# Patient Record
Sex: Female | Born: 1965 | Hispanic: No | Marital: Married | Smoking: Never smoker
Health system: Southern US, Community
[De-identification: ages and names within clinical notes are randomized; demographics above are authoritative.]

## PROBLEM LIST (undated history)

## (undated) DIAGNOSIS — M1990 Unspecified osteoarthritis, unspecified site: Secondary | ICD-10-CM

## (undated) DIAGNOSIS — E1142 Type 2 diabetes mellitus with diabetic polyneuropathy: Secondary | ICD-10-CM

## (undated) DIAGNOSIS — E119 Type 2 diabetes mellitus without complications: Secondary | ICD-10-CM

---
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
## 1998-08-24 ENCOUNTER — Other Ambulatory Visit: Admission: RE | Admit: 1998-08-24 | Discharge: 1998-08-24 | Payer: Self-pay | Admitting: Obstetrics & Gynecology

## 1998-08-25 ENCOUNTER — Other Ambulatory Visit: Admission: RE | Admit: 1998-08-25 | Discharge: 1998-08-25 | Payer: Self-pay | Admitting: Obstetrics & Gynecology

## 2015-01-08 DIAGNOSIS — M1460 Charcot's joint, unspecified site: Secondary | ICD-10-CM | POA: Insufficient documentation

## 2015-01-08 DIAGNOSIS — S92002A Unspecified fracture of left calcaneus, initial encounter for closed fracture: Secondary | ICD-10-CM | POA: Insufficient documentation

## 2015-03-27 DIAGNOSIS — I10 Essential (primary) hypertension: Secondary | ICD-10-CM | POA: Insufficient documentation

## 2015-03-27 DIAGNOSIS — L02619 Cutaneous abscess of unspecified foot: Secondary | ICD-10-CM | POA: Insufficient documentation

## 2015-03-27 DIAGNOSIS — M869 Osteomyelitis, unspecified: Secondary | ICD-10-CM | POA: Insufficient documentation

## 2015-03-27 DIAGNOSIS — M86171 Other acute osteomyelitis, right ankle and foot: Secondary | ICD-10-CM | POA: Insufficient documentation

## 2015-03-27 DIAGNOSIS — L03119 Cellulitis of unspecified part of limb: Secondary | ICD-10-CM

## 2015-03-27 DIAGNOSIS — E1065 Type 1 diabetes mellitus with hyperglycemia: Secondary | ICD-10-CM | POA: Insufficient documentation

## 2015-04-08 DIAGNOSIS — D649 Anemia, unspecified: Secondary | ICD-10-CM | POA: Insufficient documentation

## 2015-04-08 DIAGNOSIS — E8809 Other disorders of plasma-protein metabolism, not elsewhere classified: Secondary | ICD-10-CM | POA: Insufficient documentation

## 2015-04-08 DIAGNOSIS — E039 Hypothyroidism, unspecified: Secondary | ICD-10-CM | POA: Insufficient documentation

## 2015-04-08 DIAGNOSIS — Z89429 Acquired absence of other toe(s), unspecified side: Secondary | ICD-10-CM | POA: Insufficient documentation

## 2015-05-31 DIAGNOSIS — E10649 Type 1 diabetes mellitus with hypoglycemia without coma: Secondary | ICD-10-CM | POA: Insufficient documentation

## 2015-06-10 DIAGNOSIS — Z3041 Encounter for surveillance of contraceptive pills: Secondary | ICD-10-CM | POA: Insufficient documentation

## 2015-06-17 DIAGNOSIS — M5136 Other intervertebral disc degeneration, lumbar region: Secondary | ICD-10-CM | POA: Insufficient documentation

## 2015-06-17 DIAGNOSIS — N183 Chronic kidney disease, stage 3 unspecified: Secondary | ICD-10-CM | POA: Insufficient documentation

## 2015-06-17 DIAGNOSIS — E039 Hypothyroidism, unspecified: Secondary | ICD-10-CM | POA: Insufficient documentation

## 2015-06-17 DIAGNOSIS — M5126 Other intervertebral disc displacement, lumbar region: Secondary | ICD-10-CM | POA: Insufficient documentation

## 2015-06-17 DIAGNOSIS — E1022 Type 1 diabetes mellitus with diabetic chronic kidney disease: Secondary | ICD-10-CM | POA: Insufficient documentation

## 2015-06-17 DIAGNOSIS — R87810 Cervical high risk human papillomavirus (HPV) DNA test positive: Secondary | ICD-10-CM | POA: Insufficient documentation

## 2015-06-17 DIAGNOSIS — E1140 Type 2 diabetes mellitus with diabetic neuropathy, unspecified: Secondary | ICD-10-CM | POA: Insufficient documentation

## 2015-06-17 DIAGNOSIS — E785 Hyperlipidemia, unspecified: Secondary | ICD-10-CM | POA: Insufficient documentation

## 2015-06-18 DIAGNOSIS — R140 Abdominal distension (gaseous): Secondary | ICD-10-CM | POA: Insufficient documentation

## 2015-06-18 DIAGNOSIS — R635 Abnormal weight gain: Secondary | ICD-10-CM | POA: Insufficient documentation

## 2015-08-12 DIAGNOSIS — E1029 Type 1 diabetes mellitus with other diabetic kidney complication: Secondary | ICD-10-CM | POA: Insufficient documentation

## 2015-08-12 DIAGNOSIS — R809 Proteinuria, unspecified: Secondary | ICD-10-CM

## 2015-12-24 DIAGNOSIS — E119 Type 2 diabetes mellitus without complications: Secondary | ICD-10-CM | POA: Insufficient documentation

## 2015-12-24 DIAGNOSIS — R600 Localized edema: Secondary | ICD-10-CM | POA: Insufficient documentation

## 2016-02-09 DIAGNOSIS — S42001A Fracture of unspecified part of right clavicle, initial encounter for closed fracture: Secondary | ICD-10-CM | POA: Insufficient documentation

## 2016-03-15 DIAGNOSIS — E10621 Type 1 diabetes mellitus with foot ulcer: Secondary | ICD-10-CM | POA: Insufficient documentation

## 2016-03-15 DIAGNOSIS — E109 Type 1 diabetes mellitus without complications: Secondary | ICD-10-CM | POA: Insufficient documentation

## 2016-03-15 DIAGNOSIS — L97429 Non-pressure chronic ulcer of left heel and midfoot with unspecified severity: Secondary | ICD-10-CM

## 2016-05-03 ENCOUNTER — Ambulatory Visit: Payer: Self-pay | Admitting: Podiatry

## 2016-05-04 ENCOUNTER — Telehealth: Payer: Self-pay | Admitting: *Deleted

## 2016-05-04 DIAGNOSIS — Z01812 Encounter for preprocedural laboratory examination: Secondary | ICD-10-CM

## 2016-05-04 NOTE — Telephone Encounter (Signed)
Joni - [facility] asked to have clinicals from today's visit. Faxed note informing Joni, pt had rescheduled to 05/17/2016.

## 2016-05-17 ENCOUNTER — Ambulatory Visit: Payer: Self-pay | Admitting: Podiatry

## 2016-05-24 ENCOUNTER — Ambulatory Visit: Payer: Self-pay | Admitting: Podiatry

## 2016-05-26 NOTE — Telephone Encounter (Addendum)
----- Message from [name], DPM sent at 05/24/2016  3:03 PM EDT ----- If you can call them that would be great. She was a no show again today. This is the 3rd time.  ----- Message ----- From: Marissa Nestle, RN Sent: 05/24/2016   1:46 PM To: [name], DPM  Dr. Ardelle Anton, [facility] [phone]. Dr. Ardelle Anton, I can call if you would like, or do you want to give them more information. Joya San ----- Message ----- From: [name], DPM Sent: 05/24/2016  10:56 AM To: Marissa Nestle, RN  Do you have [facility]. They want a clinical note from her visit but she has either cancelled or been a no show now 3 times and I want to let them know. 05/26/2016-I informed Joni - [facility] of pt's cancellation and no show status.07/04/2016-Unable to process MRI orders or [facility] order pt does not have insurance information in EPIC.07/05/2016-Unable to process orders due to pt does not have insurance information in EPIC, [place]. [place]. 07/06/2016-Pt states she thought Dr. Ardelle Anton was to call in a cream as well as oral antibiotic. I spoke to pt and informed that Dr. Ardelle Anton was ordering a topical from a facility that would mail to her and they needed her contact number and insurance to discuss coverage and delivery. Pt states she will email the insurance information to me. 07/07/2016-Received email with pt's BCBS card front. Orders for MRI of left heel faxed to [facility]. Faxed orders for Collagen dressing with silver to [facility].07/08/2016-Pt called states the cream is still not at the [facility]. I called pt and reminded her the cream would come from a facility that would mail it to her, that is why I was so concerned with getting the insurance card copy. 07/11/2016-Pt states the MRI was cancelled due to the labs would not cross to the [place] lab and also needed a BUN. I spoke with Destiny  - [facility] and she gave me the number to speak with [facility] concerning labs. I spoke with Myriam Jacobson and she gave me the lab number for Renae Fickle and stated once she received the okay for Renae Fickle she would contact pt to reschedule. I spoke with Asher Muir in the lab and she said could send on rx form with pt information. I spoke with Renae Fickle at the lab and he states should have bar code, our [place] code and req # on the form and it would cross over. I asked if BUN/Creatinine Ratio was what he was looking for and he stated yes. I told him I would send on a script form and the printed lab form to make sure they were able to perform. Faxed the handwritten script and the printed labs to [facility].07/11/2016-Pt states she received a package from a company that states they can not fill due to insurance, what to do. 07/14/2016-I spoke with Judie Petit. Bethann Punches - [facility] and she states pt's deductible is $1400.00 and she was sent a 3-5 day complimentary dressing package with the option to cash pay, and their pricing is comparable to [employer]. I left message informing pt I had to research, then told her what M. Venard had stated and that I would inform Dr. Ardelle Anton and call with other instructions in case she did not want the cash pay offer from [facility].09/19/2016-Pt states she needs refill of the medicated dressing. Dr. Ardelle Anton  ordered continue the collagen AG dressing daily, and check pt's status. 09/20/2016-Left message informing pt I had received the request for the refill of the medicated dressing and to call with her health status. Left message for Greig Castillandrew - [facility] to call for refill order. Left message requesting call back with status of her foot and reminded her of the 09/27/2016 appt with [facility].09/21/2016-Pt states the foot is about the same little less drainage, size is the same. Pt states the last order for the silver dressing came  from a different agency and is $800.00, would like to have the order from the other supplier it cost less. I spoke with pt and she was unable to tell which supplier she preferred. Orders for collagen with silver faxed to [facility] for left heel wound 2.4 x 1.5 x 1.1cm L97.422 for daily dressing changes, faxed to [facility].

## 2016-06-07 ENCOUNTER — Ambulatory Visit: Payer: Self-pay | Admitting: Podiatry

## 2016-07-04 ENCOUNTER — Ambulatory Visit (INDEPENDENT_AMBULATORY_CARE_PROVIDER_SITE_OTHER): Payer: BLUE CROSS/BLUE SHIELD | Admitting: Podiatry

## 2016-07-04 DIAGNOSIS — L97422 Non-pressure chronic ulcer of left heel and midfoot with fat layer exposed: Secondary | ICD-10-CM

## 2016-07-04 DIAGNOSIS — L03032 Cellulitis of left toe: Secondary | ICD-10-CM

## 2016-07-04 DIAGNOSIS — L02612 Cutaneous abscess of left foot: Secondary | ICD-10-CM

## 2016-07-04 LAB — CBC WITH DIFFERENTIAL/PLATELET
Basophils Absolute: 0 cells/uL (ref 0–200)
Basophils Relative: 0 %
EOS ABS: 267 {cells}/uL (ref 15–500)
EOS PCT: 3 %
HEMATOCRIT: 39.4 % (ref 35.0–45.0)
HEMOGLOBIN: 12.6 g/dL (ref 11.7–15.5)
Lymphocytes Relative: 17 %
Lymphs Abs: 1513 cells/uL (ref 850–3900)
MCH: 29.6 pg (ref 27.0–33.0)
MCHC: 32 g/dL (ref 32.0–36.0)
MCV: 92.5 fL (ref 80.0–100.0)
MONO ABS: 890 {cells}/uL (ref 200–950)
MPV: 9.9 fL (ref 7.5–12.5)
Monocytes Relative: 10 %
NEUTROS ABS: 6230 {cells}/uL (ref 1500–7800)
NEUTROS PCT: 70 %
Platelets: 402 10*3/uL — ABNORMAL HIGH (ref 140–400)
RBC: 4.26 MIL/uL (ref 3.80–5.10)
RDW: 12.4 % (ref 11.0–15.0)
WBC: 8.9 10*3/uL (ref 3.8–10.8)

## 2016-07-04 MED ORDER — DOXYCYCLINE HYCLATE 100 MG PO TABS: 100.0000 mg | ORAL_TABLET | Freq: Two times a day (BID) | ORAL | 0 refills | Status: DC

## 2016-07-04 NOTE — Addendum Note (Signed)
Addended by: Ovid CurdWAGONER, MATTHEW R on: 07/04/2016 04:54 PM   Modules accepted: Orders

## 2016-07-04 NOTE — Progress Notes (Signed)
Subjective:    Patient ID: [name] [name], female   DOB: 51 y.o.   MRN: [id number]   HPI  [name] [name] presents to the office today for concerns of a wound on her left heel and left big toe which have been ongoing for several months. She has been a no show for her last 4 appointments with me and she presents today for an initial consultation. She states that she's had x-rays done to her left which is not of the results. She has an appointment with her infectious disease doctor this week. She has been seen by Infectious Disease for the nonhealing left foot ulcer. She was previously treated with doxycycline. She had some improvement while on antibiotics. She presents today stating she is very concerned about the wound to the foot. She states that when the skin beginning worse although analytics help for some time when she was on them.  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 Cultures were MSSA. She had an ulcer to the left big toe and was previously on Bactrim and doxycycline.    Review of Systems  All other systems reviewed and are negative.       Objective:  Physical Exam General: AAO x3, NAD  Dermatological: On the plantar aspect the left heel is a large ulceration measuring 2.5 x 2.5 x 1 cm. There is probing although close to bone but not to the bone. There is no undermining or tunneling. Small most years drainage is expressed but there is no pus. There is minimal surrounding erythema just around the wound is no ascending cellulitis. No fluctuance or crepitus or any malodor. On the plantar aspect the left hallux is a wound measuring 0.5 x 0.5 cm superficial with a granular wound base. Macerated periwound. Is no swelling erythema, ascending cellulitis. This no fluctuance or crepitus or any malodor.  Vascular: Dorsalis Pedis artery and Posterior Tibial artery pedal pulses are 2/4 bilateral with immedate capillary fill time.There  is no pain with calf compression, swelling, warmth, erythema.   Neruologic: Sensation decreased with Derrel Nip monofilament.  Musculoskeletal: No gross boney pedal deformities bilateral. No pain, crepitus, or limitation noted with foot and ankle range of motion bilateral. Muscular strength 5/5 in all groups tested bilateral.  Gait: Unassisted, Nonantalgic.      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    Plan:     -Treatment options discussed including all alternatives, risks, and complications -Etiology of symptoms were discussed -X-ray report reviewed with the patient. -This time I ordered an MRI to further evaluate for osteomyelitis. Hopefully we can get this done this week. -Ordered blood work including CBC, ESR, CRP, A1c -Doxycycline- resent to her pharmacy -CAM walker- she has this at home. Dispensed offloading pads for the wound. -Will order collagen and silver dressing changes to be done daily- ordered through [facility] -Discussed with her this is a limb threatening wound and she is at high risk. She said surgery is "not an option". Encouraged her to keep her appointments and if at any time there is any worsening to call the office immediately or go to the ER.  -RTC 1 week.   X-ray 06/16/2016 Left Heel IMPRESSION:  Soft tissue defect at the plantar aspect of the heel. Adjacent  calcaneal cortex is indistinct. This could be chronic reactive  change or represent early osteomyelitis.    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  Similar to the appearance of 07/21/2015.  Celesta Gentile, DPM

## 2016-07-05 ENCOUNTER — Telehealth: Payer: Self-pay | Admitting: Podiatry

## 2016-07-05 LAB — SEDIMENTATION RATE: SED RATE: 51 mm/h — AB (ref 0–20)

## 2016-07-05 LAB — HEMOGLOBIN A1C
Hgb A1c MFr Bld: 11 % — ABNORMAL HIGH (ref <5.7)
Mean Plasma Glucose: 269 mg/dL

## 2016-07-05 LAB — C-REACTIVE PROTEIN: CRP: 12.1 mg/L — ABNORMAL HIGH (ref <8.0)

## 2016-07-05 NOTE — Telephone Encounter (Signed)
Eber JonesCarolyn with [facility] called in regards to patients MRI. Due to her insurance, patient will need a pre-cert done. Also, since she is diabetic she will need lab work done as well. Patient requested to have her [facility] taken at [facility] because the [place] can only do it on a Saturday. You can call Myriam JacobsonHelen at [phone] with any questions. If you cannot contact her you can call Eber JonesCarolyn at [phone].

## 2016-07-06 ENCOUNTER — Other Ambulatory Visit: Payer: Self-pay | Admitting: Podiatry

## 2016-07-06 ENCOUNTER — Telehealth: Payer: Self-pay | Admitting: Podiatry

## 2016-07-06 DIAGNOSIS — L02612 Cutaneous abscess of left foot: Secondary | ICD-10-CM

## 2016-07-06 DIAGNOSIS — L03032 Cellulitis of left toe: Principal | ICD-10-CM

## 2016-07-06 NOTE — Telephone Encounter (Addendum)
-----  Message from Trula Slade, DPM sent at 07/06/2016  2:43 PM EDT ----- CRP/ESR and A1c elevated. She needs to make sure she follows up with her primary care physician for glucose management in order to help this wound heal.I informed pt of Dr. Leigh Aurora review of results and orders.

## 2016-07-06 NOTE — Telephone Encounter (Signed)
I told Eber JonesCarolyn that pt has not sent insurance information for our office to pre-cert for MRI.

## 2016-07-06 NOTE — Telephone Encounter (Signed)
Destiny with [facility] called in regards to [name] [name]'s MRI. It has been scheduled for this coming Monday 14 May at the [facility] [place]. Wants to know if we have gotten authorization from insurance yet. Also needs a lab order for creatinine and bone since patient is diabetic. Requested a call back at [phone].

## 2016-07-11 ENCOUNTER — Other Ambulatory Visit: Payer: BLUE CROSS/BLUE SHIELD

## 2016-07-12 LAB — BUN/CREATININE RATIO
BUN / CREAT RATIO: 19.3 ratio (ref 6–22)
BUN: 23 mg/dL (ref 7–25)
Creat: 1.19 mg/dL — ABNORMAL HIGH (ref 0.50–1.05)
GFR, EST AFRICAN AMERICAN: 62 mL/min (ref >=60)
GFR, Est Non African American: 53 mL/min — ABNORMAL LOW (ref >=60)

## 2016-07-16 ENCOUNTER — Other Ambulatory Visit (HOSPITAL_BASED_OUTPATIENT_CLINIC_OR_DEPARTMENT_OTHER): Payer: Self-pay

## 2016-07-18 ENCOUNTER — Ambulatory Visit (INDEPENDENT_AMBULATORY_CARE_PROVIDER_SITE_OTHER): Payer: BLUE CROSS/BLUE SHIELD

## 2016-07-18 DIAGNOSIS — L97429 Non-pressure chronic ulcer of left heel and midfoot with unspecified severity: Secondary | ICD-10-CM

## 2016-07-18 DIAGNOSIS — L03032 Cellulitis of left toe: Principal | ICD-10-CM

## 2016-07-18 DIAGNOSIS — L02612 Cutaneous abscess of left foot: Secondary | ICD-10-CM

## 2016-07-18 MED ORDER — GADOBENATE DIMEGLUMINE 529 MG/ML IV SOLN
10.0000 mL | Freq: Once | INTRAVENOUS | Status: AC | PRN
  Administered 2016-07-18: 10 mL via INTRAVENOUS

## 2016-07-19 ENCOUNTER — Ambulatory Visit (INDEPENDENT_AMBULATORY_CARE_PROVIDER_SITE_OTHER): Payer: BLUE CROSS/BLUE SHIELD | Admitting: Podiatry

## 2016-07-19 ENCOUNTER — Encounter: Payer: Self-pay | Admitting: Podiatry

## 2016-07-19 DIAGNOSIS — M86072 Acute hematogenous osteomyelitis, left ankle and foot: Secondary | ICD-10-CM

## 2016-07-19 DIAGNOSIS — L97422 Non-pressure chronic ulcer of left heel and midfoot with fat layer exposed: Secondary | ICD-10-CM

## 2016-07-19 NOTE — Progress Notes (Signed)
Subjective: [name] [name] presents the office today for follow-up evaluation of left heel ulceration and to discuss MRI results. She states that she still gets some bloody drainage-limit denies any pus. She states that she is continuing the CAM boot she is tried of the upper foot as much as possible but she does do her daily activities. She has completed her course of antibiotics. She follows up with Dr. Garnette Scheuermann today she states.  Denies any systemic complaints such as fevers, chills, nausea, vomiting. No acute changes since last appointment, and no other complaints at this time.    Objective: AAO x3, NAD DP/PT pulses palpable bilaterally, CRT less than 3 seconds On the plantar aspect of the left heel and he's been ulceration measuring 2.5 x 2.7 x 1.1 cm. There is no probing to bone although close. There is no swelling erythema, before meals cellulitis result was edema there is no fluctuance or crepitus or any malodor. Superficial and also presents the hallux. No open lesions or pre-ulcerative lesions.  No pain with calf compression, swelling, warmth, erythema  MRI 07/18/2016 IMPRESSION: Soft tissue ulcer over the posterior plantar aspect of the calcaneus with severe soft tissue edema and enhancement surrounding the ulcer consistent with cellulitis. Small area of cortical irregularity and mild marrow edema along the plantar aspect of the posterior calcaneus concerning for mild osteomyelitis.  LABS Recent Results (from the past 2160 hour(s))  CBC with Differential/Platelet     Status: Abnormal   Collection Time: 07/04/16 11:12 AM  Result Value Ref Range   WBC 8.9 3.8 - 10.8 K/uL   RBC 4.26 3.80 - 5.10 MIL/uL   Hemoglobin 12.6 11.7 - 15.5 g/dL   HCT 39.4 35.0 - 45.0 %   MCV 92.5 80.0 - 100.0 fL   MCH 29.6 27.0 - 33.0 pg   MCHC 32.0 32.0 - 36.0 g/dL   RDW 12.4 11.0 - 15.0 %   Platelets 402 (H) 140 - 400 K/uL   MPV 9.9 7.5 - 12.5 fL   Neutro Abs 6,230 1,500 - 7,800 cells/uL   Lymphs Abs  1,513 850 - 3,900 cells/uL   Monocytes Absolute 890 200 - 950 cells/uL   Eosinophils Absolute 267 15 - 500 cells/uL   Basophils Absolute 0 0 - 200 cells/uL   Neutrophils Relative % 70 %   Lymphocytes Relative 17 %   Monocytes Relative 10 %   Eosinophils Relative 3 %   Basophils Relative 0 %   Smear Review Criteria for review not met   Sedimentation rate     Status: Abnormal   Collection Time: 07/04/16 11:12 AM  Result Value Ref Range   Sed Rate 51 (H) 0 - 20 mm/hr  C-reactive protein     Status: Abnormal   Collection Time: 07/04/16 11:12 AM  Result Value Ref Range   CRP 12.1 (H) <8.0 mg/L  Hemoglobin A1c     Status: Abnormal   Collection Time: 07/04/16 11:12 AM  Result Value Ref Range   Hgb A1c MFr Bld 11.0 (H) <5.7 %    Comment:   For someone without known diabetes, a hemoglobin A1c value of 6.5% or greater indicates that they may have diabetes and this should be confirmed with a follow-up test.   For someone with known diabetes, a value <7% indicates that their diabetes is well controlled and a value greater than or equal to 7% indicates suboptimal control. A1c targets should be individualized based on duration of diabetes, age, comorbid conditions, and other considerations.  Currently, no consensus exists for use of hemoglobin A1c for diagnosis of diabetes for children.      Mean Plasma Glucose 269 mg/dL  BUN/Creatinine Ratio     Status: Abnormal   Collection Time: 07/11/16  2:53 PM  Result Value Ref Range   BUN 23 7 - 25 mg/dL   Creat 1.19 (H) 0.50 - 1.05 mg/dL    Comment:   For patients > or = 50 years of age: The upper reference limit for Creatinine is approximately 13% higher for people identified as African-American.      BUN/Creatinine Ratio 19.3 6 - 22 Ratio   GFR, Est African American 62 >=60 mL/min   GFR, Est Non African American 53 (L) >=60 mL/min     Assessment: Left chronic heel ulceration with concern for also myelitis  Plan: -All treatment  options discussed with the patient including all alternatives, risks, complications.  -MRI report was discussed with the patient as well as previous lab work. -At this point I recommended surgical wound debridement as well as bone biopsy. I discussed with the surgery as well as the postoperative course. We'll plan doing this next week. -The incision placement as well as the postoperative course was discussed with the patient. I discussed risks of the surgery which include, but not limited to, infection, bleeding, pain, swelling, need for further surgery, delayed or nonhealing, painful or ugly scar, numbness or sensation changes, over/under correction, recurrence, transfer lesions, further deformity, hardware failure, DVT/PE, loss of toe/foot. Patient understands these risks and wishes to proceed with surgery. The surgical consent was reviewed with the patient all 3 pages were signed. No promises or guarantees were given to the outcome of the procedure. All questions were answered to the best of my ability. Before the surgery the patient was encouraged to call the office if there is any further questions. The surgery will be performed at the [facility] or [facility] on an outpatient basis. -Recommended nonweightbearing and prescription for knee scooter was provided today. -Also discussed nutritional support as well as glucose control. -Patient encouraged to call the office with any questions, concerns, change in symptoms.   Celesta Gentile, DPM

## 2016-07-19 NOTE — Patient Instructions (Signed)

## 2016-07-21 DIAGNOSIS — A4901 Methicillin susceptible Staphylococcus aureus infection, unspecified site: Secondary | ICD-10-CM | POA: Insufficient documentation

## 2016-07-26 ENCOUNTER — Telehealth: Payer: Self-pay | Admitting: *Deleted

## 2016-07-26 NOTE — Telephone Encounter (Signed)
I attempted to return her call to reschedule her surgery.  I asked her to give Enzo BiVicki Hill a call for an estimate.  (Wound Debridement and Bone Biopsy Heel Lt)

## 2016-07-26 NOTE — Telephone Encounter (Signed)
"  I'm a patient of Dr. Lequita HaltMorgan.  Originally I was trying to schedule a procedure for tomorrow.  I never heard from anyone as far as my insurance goes.  I've got to find that out before I can have it done.  I also want to reschedule my surgery."

## 2016-07-27 NOTE — Telephone Encounter (Signed)
She really needs to get in to have have surgery to get a bone biopsy of her heel. She did not relate to me that she needed to have a quote prior to surgery.

## 2016-07-27 NOTE — Telephone Encounter (Signed)
"  I am ready to schedule my surgery now that I received the insurance information.  Does he only do it on Wednesdays?"  Yes, he only does surgery on Wednesdays.  "Can he do it next week?"  No, he does not have anything at [facility].  He may be able to do it at [facility] but you will have to have a physical and it could be around 4 pm.  You will not be able to eat or drink anything prior to that.  "I guess I'll just have to wait until the 13.  Can you let me know if there's any cancellations?"  Yes, I'll let you know.

## 2016-07-29 DIAGNOSIS — G5793 Unspecified mononeuropathy of bilateral lower limbs: Secondary | ICD-10-CM | POA: Insufficient documentation

## 2016-08-01 ENCOUNTER — Telehealth: Payer: Self-pay | Admitting: *Deleted

## 2016-08-01 NOTE — Telephone Encounter (Addendum)
"  I just missed your call.  So, returning your phone call."  I am calling to let you know Dr. Ardelle AntonWagoner had a cancellation for Wednesday of this week.  He wanted me to reschedule your surgery to this Wednesday.  "Okay that's fine."  I called and informed Aram BeechamCynthia at [facility].

## 2016-08-01 NOTE — Telephone Encounter (Signed)
I left patient a message to give me a call back regarding rescheduling her appointment from 08/10/2016 to 08/03/2016 per Dr. Ardelle AntonWagoner.

## 2016-08-02 ENCOUNTER — Encounter: Payer: BLUE CROSS/BLUE SHIELD | Admitting: Podiatry

## 2016-08-03 ENCOUNTER — Telehealth: Payer: Self-pay | Admitting: Podiatry

## 2016-08-03 ENCOUNTER — Encounter: Payer: Self-pay | Admitting: Podiatry

## 2016-08-03 DIAGNOSIS — M86672 Other chronic osteomyelitis, left ankle and foot: Secondary | ICD-10-CM | POA: Diagnosis not present

## 2016-08-03 NOTE — Telephone Encounter (Signed)
Left message for pt to call to schedule appt to have dressing changed for 6.8.18 with the nurse per Dr Ardelle AntonWagoner.

## 2016-08-04 ENCOUNTER — Telehealth: Payer: Self-pay | Admitting: Podiatry

## 2016-08-04 ENCOUNTER — Ambulatory Visit (INDEPENDENT_AMBULATORY_CARE_PROVIDER_SITE_OTHER): Payer: Self-pay | Admitting: Podiatry

## 2016-08-04 ENCOUNTER — Ambulatory Visit (INDEPENDENT_AMBULATORY_CARE_PROVIDER_SITE_OTHER): Payer: BLUE CROSS/BLUE SHIELD

## 2016-08-04 ENCOUNTER — Encounter: Payer: Self-pay | Admitting: Podiatry

## 2016-08-04 DIAGNOSIS — M5126 Other intervertebral disc displacement, lumbar region: Secondary | ICD-10-CM | POA: Insufficient documentation

## 2016-08-04 DIAGNOSIS — L03119 Cellulitis of unspecified part of limb: Secondary | ICD-10-CM | POA: Insufficient documentation

## 2016-08-04 DIAGNOSIS — M86171 Other acute osteomyelitis, right ankle and foot: Secondary | ICD-10-CM | POA: Insufficient documentation

## 2016-08-04 DIAGNOSIS — L97422 Non-pressure chronic ulcer of left heel and midfoot with fat layer exposed: Secondary | ICD-10-CM

## 2016-08-04 DIAGNOSIS — R87810 Cervical high risk human papillomavirus (HPV) DNA test positive: Secondary | ICD-10-CM | POA: Insufficient documentation

## 2016-08-04 DIAGNOSIS — J019 Acute sinusitis, unspecified: Secondary | ICD-10-CM | POA: Insufficient documentation

## 2016-08-04 DIAGNOSIS — M14672 Charcot's joint, left ankle and foot: Secondary | ICD-10-CM | POA: Insufficient documentation

## 2016-08-04 DIAGNOSIS — M545 Low back pain, unspecified: Secondary | ICD-10-CM | POA: Insufficient documentation

## 2016-08-04 DIAGNOSIS — H109 Unspecified conjunctivitis: Secondary | ICD-10-CM | POA: Insufficient documentation

## 2016-08-04 NOTE — Telephone Encounter (Signed)
error 

## 2016-08-05 ENCOUNTER — Ambulatory Visit: Payer: BLUE CROSS/BLUE SHIELD | Admitting: Podiatry

## 2016-08-07 NOTE — Progress Notes (Signed)
Subjective: [name] [name] is a 51 y.o. is seen today in office s/p left heel ulceration debridement and bone biopsy preformed on 08/03/2016. She presents today because she said she started to notice blood on the bandage around noon today. She states her pain is controlled. She has continued with the surgical boot.  Denies any systemic complaints such as fevers, chills, nausea, vomiting. No calf pain, chest pain, shortness of breath.   Objective: General: No acute distress, AAOx3  DP/PT pulses palpable 2/4, CRT < 3 sec to all digits.  Sensation decreased with SWMF. Motor function intact.  On the plantar aspect of the left heel continues to be a cranial her wound which is the same in size compared to yesterday at surgery. There is no active bleeding identified. There is no swelling erythema, ascending cellulitis. There is no drainage or pus expressed today. There was moderate amount of dried blood on the bandage. Abrasion type lesion continues to the left hallux which is continued. No redness or warmth. No other open lesions or pre-ulcerative lesions.  No pain with calf compression, swelling, warmth, erythema.   Assessment and Plan:  Status post left heel wound debridement presents today for dressing changes due to blood on the bandage  -Treatment options discussed including all alternatives, risks, and complications -X-rays were obtained and reviewed. Cortical changes the inferior portion of the calcaneus which likely a result of bone biopsy of this area however cannot rule out osteomyelitis. No soft tissue emphysema is present. -Area was cleaned today. No active bleeding was identified. Saline wet-to-dry packing was applied. I showed her how to do this today supplies were given to her. -Awaiting cultures/pathology results. -Dispensed a surgical shoe with a peg assist insert take pressure off the plantar heel. -Elevation. -Pain medication if needed. -Monitor for any clinical signs or symptoms of  infection and DVT/PE and directed to call the office immediately should any occur or go to the ER. -Follow-up as scheduled or sooner if any problems arise. In the meantime, encouraged to call the office with any questions, concerns, change in symptoms.   Ovid CurdMatthew Wagoner, DPM

## 2016-08-09 ENCOUNTER — Encounter: Payer: BLUE CROSS/BLUE SHIELD | Admitting: Podiatry

## 2016-08-09 ENCOUNTER — Encounter: Payer: Self-pay | Admitting: Podiatry

## 2016-08-09 ENCOUNTER — Ambulatory Visit (INDEPENDENT_AMBULATORY_CARE_PROVIDER_SITE_OTHER): Payer: Self-pay | Admitting: Podiatry

## 2016-08-09 DIAGNOSIS — L97422 Non-pressure chronic ulcer of left heel and midfoot with fat layer exposed: Secondary | ICD-10-CM

## 2016-08-09 MED ORDER — AMOXICILLIN-POT CLAVULANATE 875-125 MG PO TABS: 1.0000 | ORAL_TABLET | Freq: Two times a day (BID) | ORAL | 0 refills | Status: DC

## 2016-08-09 MED ORDER — CIPROFLOXACIN HCL 500 MG PO TABS: 500.0000 mg | ORAL_TABLET | Freq: Two times a day (BID) | ORAL | 0 refills | Status: DC

## 2016-08-09 NOTE — Progress Notes (Signed)
Subjective: [name] [name] is a 51 y.o. is seen today in office s/p left heel ulceration debridement and bone biopsy preformed on 08/03/2016. She presents today for follow-up. She is continuing saline wet-to-dry dressing changes daily. She is continuing on Augmentin since the surgery. She states that she still gets some bloody drainage from the wound but denies any pus and the drainage has decreased. She feels that the wound is healing in. She is continuing with a surgical shoe with offloading area. Denies any systemic complaints such as fevers, chills, nausea, vomiting. No calf pain, chest pain, shortness of breath.   Objective: General: No acute distress, AAOx3  DP/PT pulses palpable 2/4, CRT < 3 sec to all digits.  Sensation decreased with SWMF. Motor function intact.  On the plantar aspect of the left heel continues to be about the same in diameter however there is evidence of healing and there is new granulation tissue present within the wound bed. It is filled in approximately a third compared to last appointment. Minimal hyperkeratotic periwound. There is no surrounding erythema, ascending synovitis. There is no fluctuation, crepitus, malodor. Overall the wound appears to be healthy healing in well  No other open lesions or pre-ulcerative lesions.  No pain with calf compression, swelling, warmth, erythema.   Assessment and Plan:  Status post left heel wound debridement presents today for routine follow-up.  -Treatment options discussed including all alternatives, risks, and complications -Wound was debrided the hyperkeratotic periwound. Continue with saline wet-to-dry dressing changes daily. -Continue a surgical shoe, offloading. -I received some of the culture results back. Unfortunately the calcaneal biopsy microbiology is growing Enterococcus and A. Faecalis. We'll continue Augmentin and ciprofloxacin of this as well. Once I got final culture results for fluid her this week I will send her  to infectious disease physician. She needs to call and get a follow-up as she had a missed her last appointment due to the surgery. -Monitor for any clinical signs or symptoms of infection and directed to call the office immediately should any occur or go to the ER. -She has an upcoming beach trip planned. Discussed she cannot get this wet or go on the beach.  -RTC 1 week or sooner if needed.  Ovid CurdMatthew Brayln Duque, DPM

## 2016-08-15 ENCOUNTER — Telehealth: Payer: Self-pay | Admitting: *Deleted

## 2016-08-15 NOTE — Telephone Encounter (Addendum)
-----   Message from Vivi BarrackMatthew R Wagoner, DPM sent at 08/15/2016  8:03 AM EDT ----- I have only seen a culture from surgery and not yet the pathology and I think there should be one more culture. Can you please call to see if they are finalized yet? Thanks. Guillermina CityJulie - [place] states will fax the results.

## 2016-08-16 ENCOUNTER — Ambulatory Visit (INDEPENDENT_AMBULATORY_CARE_PROVIDER_SITE_OTHER): Payer: BLUE CROSS/BLUE SHIELD | Admitting: Podiatry

## 2016-08-16 ENCOUNTER — Encounter (INDEPENDENT_AMBULATORY_CARE_PROVIDER_SITE_OTHER): Payer: Self-pay

## 2016-08-16 ENCOUNTER — Encounter: Payer: Self-pay | Admitting: Podiatry

## 2016-08-16 ENCOUNTER — Encounter: Payer: BLUE CROSS/BLUE SHIELD | Admitting: Podiatry

## 2016-08-16 DIAGNOSIS — L97422 Non-pressure chronic ulcer of left heel and midfoot with fat layer exposed: Secondary | ICD-10-CM

## 2016-08-16 DIAGNOSIS — M86072 Acute hematogenous osteomyelitis, left ankle and foot: Secondary | ICD-10-CM

## 2016-08-17 NOTE — Progress Notes (Signed)
Subjective: [name] [name] is a 51 y.o. is seen today in office s/p left heel ulceration debridement and bone biopsy preformed on 08/03/2016. She states that she is doing well and that she has been changing the bandages daily with a saline wet to dry. She feels the wound is healing in. Denies any pus or any surrounding erythema or ascending cellulitis. Denies any systemic complaints such as fevers, chills, nausea, vomiting. No calf pain, chest pain, shortness of breath.   Objective: General: No acute distress, AAOx3  DP/PT pulses palpable 2/4, CRT < 3 sec to all digits.  Sensation decreased with SWMF. Motor function intact.  On the plantar aspect of the left heel continues to be about the same in diameter however there is evidence of healing and there is new granulation tissue present within the wound bed. The wound has a depth of about 1.1cm. There is some fibrotic tissue over the wound today, although minimal. Minimal hyperkeratotic periwound. There is no surrounding erythema, ascending cellulitis. There is no fluctuation, crepitus, malodor. Overall the wound appears to be healthy healing in well No other open lesions or pre-ulcerative lesions.  No pain with calf compression, swelling, warmth, erythema.   Assessment and Plan:  Status post left heel wound debridement presents today for routine follow-up.  -Treatment options discussed including all alternatives, risks, and complications -Wound was debrided the hyperkeratotic periwound and to granular, healthy tissue. Continue with saline wet-to-dry dressing changes daily for now. Will order a calcium alginate dressing to apply to the ulcer.  -Discussed biopsy results. She has an upcoming appt with ID this week. I provided her with copies of the results.  -Continue a surgical shoe, offloading. -Monitor for any clinical signs or symptoms of infection and directed to call the office immediately should any occur or go to the ER. -She has an upcoming  beach trip planned. Discussed she cannot get this wet or go on the beach.  -RTC 1 week or sooner if needed.  [name] [name], DPM

## 2016-08-26 ENCOUNTER — Encounter: Payer: Self-pay | Admitting: Podiatry

## 2016-08-26 NOTE — Progress Notes (Signed)
DOS 06.06.2018 Left heel wound debridement (cleaning of wound) with biopsy and bone biopsy.

## 2016-09-05 ENCOUNTER — Telehealth: Payer: Self-pay | Admitting: *Deleted

## 2016-09-05 NOTE — Telephone Encounter (Signed)
Dr. Ardelle AntonWagoner ordered Collagen AG for daily dressing changes of wound to left heel 3 x 3 x 1.1cm L97.422 with moderate exudate, faxed to [facility].

## 2016-09-06 ENCOUNTER — Ambulatory Visit (INDEPENDENT_AMBULATORY_CARE_PROVIDER_SITE_OTHER): Payer: BLUE CROSS/BLUE SHIELD | Admitting: Podiatry

## 2016-09-06 ENCOUNTER — Encounter: Payer: Self-pay | Admitting: Podiatry

## 2016-09-06 DIAGNOSIS — L97422 Non-pressure chronic ulcer of left heel and midfoot with fat layer exposed: Secondary | ICD-10-CM

## 2016-09-07 ENCOUNTER — Telehealth: Payer: Self-pay | Admitting: Podiatry

## 2016-09-07 LAB — CBC WITH DIFFERENTIAL/PLATELET
BASOS: 0 %
Basophils Absolute: 0 10*3/uL (ref 0.0–0.2)
EOS (ABSOLUTE): 0.1 10*3/uL (ref 0.0–0.4)
EOS: 1 %
HEMATOCRIT: 41 % (ref 34.0–46.6)
HEMOGLOBIN: 13 g/dL (ref 11.1–15.9)
IMMATURE GRANS (ABS): 0.1 10*3/uL (ref 0.0–0.1)
Immature Granulocytes: 1 %
LYMPHS ABS: 2.4 10*3/uL (ref 0.7–3.1)
LYMPHS: 24 %
MCH: 29.5 pg (ref 26.6–33.0)
MCHC: 31.7 g/dL (ref 31.5–35.7)
MCV: 93 fL (ref 79–97)
MONOCYTES: 6 %
Monocytes Absolute: 0.6 10*3/uL (ref 0.1–0.9)
NEUTROS ABS: 7 10*3/uL (ref 1.4–7.0)
Neutrophils: 68 %
Platelets: 377 10*3/uL (ref 150–379)
RBC: 4.4 x10E6/uL (ref 3.77–5.28)
RDW: 14.1 % (ref 12.3–15.4)
WBC: 10.3 10*3/uL (ref 3.4–10.8)

## 2016-09-07 LAB — C-REACTIVE PROTEIN: CRP: 2.8 mg/L (ref 0.0–4.9)

## 2016-09-07 LAB — SEDIMENTATION RATE: Sed Rate: 23 mm/h (ref 0–40)

## 2016-09-07 NOTE — Telephone Encounter (Signed)
I am a pt of Dr. Gabriel RungWagoner's and he did surgery back in June. Well, before my surgery, my neurologist prescribed me 5 mg Oxycodone and after following Dr. Gabriel RungWagoner's instructions and I will be out of the Rx tomorrow. My neurologist will not refill the Rx so I was wondering if Dr. Ardelle AntonWagoner would give me a prescription of about 20 pills. If somebody could call me back at your earliest convenience. Thank you.

## 2016-09-07 NOTE — Progress Notes (Signed)
Subjective: [name] [name] is a 51 y.o. is seen today in office s/p left heel ulceration debridement and bone biopsy preformed on 08/03/2016. She has continued with saline wet to dry dressing. She has remained on antibiotics per infectious disease. She's remain in the surgical shoe with offloading. She has extensive all amount of drainage coming from the wound but denies any pus and she denies any increase in swelling or redness to her feet denies any red streaks. Overall she states that she is doing well she feels that the wound is healing in.  Denies any systemic complaints such as fevers, chills, nausea, vomiting. No calf pain, chest pain, shortness of breath.   Objective: General: No acute distress, AAOx3  DP/PT pulses palpable 2/4, CRT < 3 sec to all digits.  Sensation decreased with SWMF. Motor function intact.  On the plantar aspect of the left heel continues to be a wound which measures probably 2.4 x 1.5 cm. Along the medial aspect of the heel the wound does probe approximately 1.1 cm. Small amount of serosangenous drainage but no pus. There is no surrounding erythema, ascending cellulitis.  No other open lesions or pre-ulcerative lesions.  No pain with calf compression, swelling, warmth, erythema.   Assessment and Plan:  Status post left heel wound debridement  -Treatment options discussed including all alternatives, risks, and complications -Wound was debrided the hyperkeratotic periwound and to granular, healthy tissue. Continue with saline wet-to-dry dressing changes daily for now. -Continue with antibiotics per infectious disease. -Blood work ordered including ESR, CRP, CBC -The wound appears to be healing and does look better however continue to wear the surgical shoe and cleaning  -Continue a surgical shoe, offloading. -Monitor for any clinical signs or symptoms of infection and directed to call the office immediately should any occur or go to the ER. -She has an upcoming beach  trip planned. Discussed she cannot get this wet or go on the beach.  -RTC 2 weeks or sooner if needed.  Celesta Gentile, DPM

## 2016-09-08 MED ORDER — OXYCODONE HCL 5 MG PO TABS: 5.0000 mg | ORAL_TABLET | Freq: Four times a day (QID) | ORAL | 0 refills | Status: DC | PRN

## 2016-09-08 NOTE — Telephone Encounter (Signed)
Dr. Ardelle AntonWagoner ordered oxycodone 5mg  #15 one tablet every 6 hours prn pain.

## 2016-09-08 NOTE — Telephone Encounter (Signed)
What is she taking the pain medication for?

## 2016-09-09 ENCOUNTER — Telehealth: Payer: Self-pay | Admitting: *Deleted

## 2016-09-09 ENCOUNTER — Telehealth: Payer: Self-pay | Admitting: Podiatry

## 2016-09-09 NOTE — Telephone Encounter (Signed)
Returned the phone call to the Pharmacist who explained that patient is receiving the same medication from Dr. Alton RevereFeraru and wanted to check to be sure we were aware.  If they fill the rx written by Dr Ardelle AntonWagoner it will be six days early.  Spoke to Dr Ardelle AntonWagoner and he stated that if that is the case she is taking it improperly and he will not authorize them to fill the rx he wrote.  Pharmacist will void the Rx.

## 2016-09-09 NOTE — Telephone Encounter (Addendum)
-----   Message from Vivi BarrackMatthew R Wagoner, DPM sent at 09/07/2016  3:02 PM EDT ----- Blood work is all normal and the labs that had previously been elevated have returned to normal.09/09/2016-I informed pt of Dr. Gabriel RungWagoner's review of results. Pt asked if she should continue the antibiotic and I told her to continue the antibiotic unless she was having a problem with it. Pt states understanding.

## 2016-09-09 NOTE — Telephone Encounter (Signed)
This is [name] LeaverBernard [name] with [name] [phone] calling about a Rx that pt brought in written by Dr. Ardelle AntonWagoner. Same Rx pt is on from another doctor. I just need to clarify with you before I fill Rx. Please call me back at [phone].

## 2016-09-20 ENCOUNTER — Ambulatory Visit: Payer: BLUE CROSS/BLUE SHIELD | Admitting: Podiatry

## 2016-09-20 ENCOUNTER — Telehealth: Payer: Self-pay | Admitting: Podiatry

## 2016-09-20 NOTE — Telephone Encounter (Signed)
Hi Valery, this is Greig Castillandrew from [facility]. I'm calling you back in regards to the message you left requesting to a re-order of her supplies. I do need the most recent wound measurement. You can either call me back at [phone] or you can fax it to us at [phone]. I am working on getting a copy of that customized order from over to you guys in a little bit.

## 2016-09-20 NOTE — Telephone Encounter (Signed)
Dr. Ardelle AntonWagoner ordered refill Collagen with silver dressings for left heel ulcer L97.422 measuring 0.2 x 1.5 x 1.1cm with low exudate, faxed to [facility].

## 2016-09-27 ENCOUNTER — Ambulatory Visit (HOSPITAL_BASED_OUTPATIENT_CLINIC_OR_DEPARTMENT_OTHER)
Admission: RE | Admit: 2016-09-27 | Discharge: 2016-09-27 | Disposition: A | Discharge status: Home / Self Care | Payer: BLUE CROSS/BLUE SHIELD | Source: Ambulatory Visit | Attending: Podiatry | Admitting: Podiatry

## 2016-09-27 ENCOUNTER — Ambulatory Visit (INDEPENDENT_AMBULATORY_CARE_PROVIDER_SITE_OTHER): Payer: BLUE CROSS/BLUE SHIELD | Admitting: Podiatry

## 2016-09-27 ENCOUNTER — Encounter: Payer: Self-pay | Admitting: Podiatry

## 2016-09-27 DIAGNOSIS — L97422 Non-pressure chronic ulcer of left heel and midfoot with fat layer exposed: Secondary | ICD-10-CM | POA: Diagnosis not present

## 2016-09-27 DIAGNOSIS — L97429 Non-pressure chronic ulcer of left heel and midfoot with unspecified severity: Secondary | ICD-10-CM | POA: Diagnosis present

## 2016-09-27 DIAGNOSIS — M86672 Other chronic osteomyelitis, left ankle and foot: Secondary | ICD-10-CM

## 2016-09-27 DIAGNOSIS — M19072 Primary osteoarthritis, left ankle and foot: Secondary | ICD-10-CM | POA: Insufficient documentation

## 2016-09-27 DIAGNOSIS — L97421 Non-pressure chronic ulcer of left heel and midfoot limited to breakdown of skin: Secondary | ICD-10-CM | POA: Insufficient documentation

## 2016-09-27 NOTE — Progress Notes (Signed)
Subjective: [name] [name] is a 51 y.o. is seen today in office s/p left heel ulceration debridement and bone biopsy preformed on 08/03/2016. She presents today for follow-up evaluation of the ulcer. She unfortunately had to miss he last appointment as she had a stomach virus. She states she has continued with saline wet to dry to the wound daily. She states that she has been wearing a surgical shoe but she does continue to walk quite a bit. Stresses to purchase a cam but she does continue with daily activities. Denies any drainage or pus. Denies any swelling redness or red streaks. She is continuing antibiotics. Overall she feels that the wound is healing and although slowly. Denies any systemic complaints such as fevers, chills, nausea, vomiting. No calf pain, chest pain, shortness of breath.   Objective: General: No acute distress, AAOx3  DP/PT pulses palpable 2/4, CRT < 3 sec to all digits.  Sensation decreased with SWMF. Motor function intact.  On the plantar aspect of the left heel continues to be a wound which measures probably 2.2 x 1.5 cm. Along the medial aspect of the heel the wound does probe approximate one semi-but this is more of undermining as opposed to probing deepened appears to be healing in. The wound base is granular. There is a small amount of bloody drainage there is no pus. There is no surrounding erythema or ascending synovitis. There is no fluctuance or crepitus. There is no malodor. No other open lesions or pre-ulcerative lesions.  No pain with calf compression, swelling, warmth, erythema.   Assessment and Plan:  Status post left heel wound debridement  -Treatment options discussed including all alternatives, risks, and complications -Wound was debrided the hyperkeratotic periwound and to granular, healthy tissue. Continue with saline wet-to-dry dressing changes daily for now I did order a collagen/overdressing through [facility].  -Continue with antibiotics per infectious  disease. -Patient ordered. She says top-down scissor radiology x-ray done today. -Continue with surgical shoes/offloading. I recommended nonweightbearing she's been very active on her feet. I've ordered a knee scooter for her. Discussed possible casting would hold off on that for now continue daily wound care. -Monitor for any clinical signs or symptoms of infection and directed to call the office immediately should any occur or go to the ER. -She has an upcoming beach trip planned. Discussed she cannot get this wet or go on the beach.  -RTC 2 weeks or sooner if needed.  Ovid CurdMatthew Wagoner, DPM

## 2016-09-27 NOTE — Patient Instructions (Signed)
Finish course of antibiotics I have ordered new wound care supplies for you as we discussed. Until you get them continue with the saline wet to dry.  I would keep weight off of your foot completely! Monitor for any signs/symptoms of infection. Call the office immediately if any occur or go directly to the emergency room. Call with any questions/concerns.

## 2016-09-28 ENCOUNTER — Telehealth: Payer: Self-pay | Admitting: *Deleted

## 2016-09-28 NOTE — Telephone Encounter (Addendum)
-----   Message from Vivi BarrackMatthew R Wagoner, DPM sent at 09/27/2016  4:29 PM EDT ----- Regarding: knee scooter Corrie DandyMary,  I put in an order for Enola to get a knee scooter if possible. Could you please follow-up with her on this? Thank you!  Cendant CorporationMatt Wagoner. 09/28/2016-DrArdelle Anton. 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

## 2016-09-28 NOTE — Telephone Encounter (Signed)
Routed message to [facility] DME division.

## 2016-09-28 NOTE — Telephone Encounter (Addendum)
-----   Message from Vivi BarrackMatthew R Wagoner, DPM sent at 09/27/2016  5:44 PM EDT ----- Negative for bone infection. Please let her know. Thank you.09/28/2016-I informed pt of Dr. Gabriel RungWagoner's review of x-ray results.

## 2016-10-11 ENCOUNTER — Encounter: Payer: Self-pay | Admitting: Podiatry

## 2016-10-11 ENCOUNTER — Ambulatory Visit (INDEPENDENT_AMBULATORY_CARE_PROVIDER_SITE_OTHER): Payer: BLUE CROSS/BLUE SHIELD | Admitting: Podiatry

## 2016-10-11 DIAGNOSIS — L97422 Non-pressure chronic ulcer of left heel and midfoot with fat layer exposed: Secondary | ICD-10-CM | POA: Diagnosis not present

## 2016-10-12 DIAGNOSIS — L97422 Non-pressure chronic ulcer of left heel and midfoot with fat layer exposed: Secondary | ICD-10-CM | POA: Insufficient documentation

## 2016-10-12 NOTE — Progress Notes (Signed)
Subjective: [name] [name] is a 51 y.o. is seen today in office s/p left heel ulceration debridement and bone biopsy preformed on 08/03/2016. She has finished her course of antibiotics. She states that she is remaining in the surgical shoe and try to limit her weightbearing as much as possible. She has continued with collagen/silver dressing changes daily. She states that she's not had any thicker purulent drainage and she occasionally gets some bleeding. She currently denies any systemic complaints such as fevers, chills, nausea, vomiting. Denies any calf pain, chest pain, shortness of breath.  She states that she recently is apply for disability.  Objective: General: No acute distress, AAOx3  DP/PT pulses palpable 2/4, CRT < 3 sec to all digits.  Sensation decreased with SWMF. Motor function intact.  On the plantar aspect of the left heel continues to be a wound which measures probably 2 x 1.5 cm. The wound appears to be filling in nicely. The wound on the probes approximately 0.4 cm. There is no probing to bone, undermining or tunneling. There is no fluctuance or crepitus. There is no swelling erythema, ascending cellulitis. No malodor. allux appears to be dry with a very small superficial granular noninfected ulceration. No pain with calf compression, swelling, warmth, erythema.   Assessment and Plan:  Status post left heel wound debridement  -Treatment options discussed including all alternatives, risks, and complications -The wound was sharply debrided without any complications utilizing a scalpel. Continue with collagen/silver dressing daily. -At this point there is no clinical signs of infections we'll hold off any further antibiotics at this point. Recommend to continue to follow-up with infectiousas well. -Recommended antibiotic cream for the hallux ulceration -Continue surgical shoe/offloading. Limit weightbearing  -Monitor for any clinical signs or symptoms of infection and directed  to call the office immediately should any occur or go to the ER. -RTC in 2 weeks or sooner if needed.  [name] CurdMatthew Maxtyn [name], DPM

## 2016-10-25 ENCOUNTER — Ambulatory Visit (INDEPENDENT_AMBULATORY_CARE_PROVIDER_SITE_OTHER): Payer: BLUE CROSS/BLUE SHIELD | Admitting: Podiatry

## 2016-10-25 DIAGNOSIS — L97422 Non-pressure chronic ulcer of left heel and midfoot with fat layer exposed: Secondary | ICD-10-CM

## 2016-10-25 MED ORDER — COLLAGENASE 250 UNIT/GM EX OINT: 1.0000 "application " | TOPICAL_OINTMENT | Freq: Every day | CUTANEOUS | 0 refills | Status: DC

## 2016-10-25 NOTE — Progress Notes (Signed)
Subjective: [name] [name] is a 51 y.o. is seen today in office s/p left heel ulceration debridement and bone biopsy preformed on 08/03/2016. She feels that the wound is doing better she's not having any drainage or pus. She feels of the wound is becoming more superficial as well. She states that the wound to stay moist at times but denies any pus or drainage. She denies any synovitis or red streaks. She denies any increase in swelling to her feet. She currently denies any systemic complaints such as fevers, chills, nausea, vomiting. Denies any calf pain, chest pain, shortness of breath.  She states that she recently is apply for disability.  Objective: General: No acute distress, AAOx3  DP/PT pulses palpable 2/4, CRT < 3 sec to all digits.  Sensation decreased with SWMF. Motor function intact.  On the plantar aspect of the left heel continues to be a wound which measures probably 2 x 1.5 x 0.2 cm. the wound does appear to be fibrotic prior to debridement. The wound appears to be filling in nicely and is more superficial straight. There is no probing, undermining or tunneling. There is no surrounding erythema, ascending cellulitis. There is no fluctuance or crepitus. There is no malodor. No pain with calf compression, swelling, warmth, erythema.   Assessment and Plan:  Status post left heel wound debridement  -Treatment options discussed including all alternatives, risks, and complications -The wound was sharply debrided today without any complications sent to healthy, granular tissue. This point tenderness which is Santyl dressing changes and this was ordered today. Message was sent to [facility] for this.  -Continue surgical shoe and offloading all times. -signs of infection today so we are going to hold off any further oral antibiotics. -Glycemic control. Discussed nutrition to help would healing as well.  -Monitor for any clinical signs or symptoms of infection and directed to call the office  immediately should any occur or go to the ER. -RTC in 2 weeks or sooner if needed.  Ovid Curd, DPM

## 2016-10-25 NOTE — Telephone Encounter (Addendum)
-----   Message from [name], DPM sent at 10/25/2016 11:17 AM EDT ----- Can you order a 90g tube of santyl for her? I was going to but could not remember the pharmacy. Thanks! Faxed required form, clinicals and demographics to [facility].

## 2016-11-08 ENCOUNTER — Ambulatory Visit (INDEPENDENT_AMBULATORY_CARE_PROVIDER_SITE_OTHER): Payer: BLUE CROSS/BLUE SHIELD | Admitting: Podiatry

## 2016-11-08 DIAGNOSIS — L97422 Non-pressure chronic ulcer of left heel and midfoot with fat layer exposed: Secondary | ICD-10-CM | POA: Diagnosis not present

## 2016-11-08 NOTE — Progress Notes (Signed)
Subjective: Flora LippsKerrin D Weimar is a 51 y.o. is seen today in office s/p left heel ulceration debridement and bone biopsy preformed on 08/03/2016. She states that overall she is doing well. She did not receive the Santyl. She's been using dry gauze to the wound daily. She is admitted in the surgical shoe. She continues to walk and surgical shoe the offloading pad in her daily activities. She denies any drainage or pus and denies any swelling or redness to her feet. She has no new concerns today. She currently denies any systemic complaints such as fevers, chills, nausea, vomiting. Denies any calf pain, chest pain, shortness of breath.  Objective: General: No acute distress, AAOx3  DP/PT pulses palpable 2/4, CRT < 3 sec to all digits.  Sensation decreased with SWMF. Motor function intact.  On the plantar aspect of the left heel continues to be a wound which measures probably 2 x 1.8 x 0.2 cm. part of the wound probes approximately 0.2 cm but the central aspect is hyper-granular. Wound base is fibro-granular. There is no probing, undermining or tunneling. There is no surrounding erythema, ascending cellulitis. There is no fluctuance or crepitus. There is no malodor. There is no clinical signs of infection present. No pain with calf compression, swelling, warmth, erythema.   Assessment and Plan:  Status post left heel wound debridement  -Treatment options discussed including all alternatives, risks, and complications -The wound was sharply debrided today without any complications sent to healthy, granular tissue. We'll try to reorder Santyl. Discussed that she is not give us regular call next couple of days to call the office for follow-up on this. Remain in the surgical shoe at all times with offloading. -Monitor for any clinical signs or symptoms of infection and directed to call the office immediately should any occur or go to the ER. -RTC in 2 weeks or sooner if needed.  Ovid CurdMatthew Wagoner, DPM

## 2016-11-09 ENCOUNTER — Telehealth: Payer: Self-pay | Admitting: *Deleted

## 2016-11-09 MED ORDER — COLLAGENASE 250 UNIT/GM EX OINT: 1.0000 "application " | TOPICAL_OINTMENT | Freq: Every day | CUTANEOUS | 0 refills | Status: DC

## 2016-11-09 NOTE — Telephone Encounter (Addendum)
-----   Message from Vivi BarrackMatthew R Wagoner, DPM sent at 11/08/2016 11:29 AM EDT ----- Regarding: santyl Can you order a 90g tube of santyl for her? I was going to but could not remember the pharmacy. Thanks! 11/09/2016-Escribed Santyl to [facility].

## 2016-11-10 ENCOUNTER — Telehealth: Payer: Self-pay | Admitting: Podiatry

## 2016-11-10 NOTE — Telephone Encounter (Signed)
Hi, this is OrthoptistTara with [facility]. I need to get the pt's wound measurements, as well as the day supply for the santyl. If you would, please call us back at [phone]. Thank you.

## 2016-11-10 NOTE — Telephone Encounter (Signed)
I informed [name] [name] - [facility] of the wound measurements of 11/08/2016 and 30 day supply.

## 2016-11-22 ENCOUNTER — Ambulatory Visit (INDEPENDENT_AMBULATORY_CARE_PROVIDER_SITE_OTHER): Payer: BLUE CROSS/BLUE SHIELD | Admitting: Podiatry

## 2016-11-22 ENCOUNTER — Encounter: Payer: Self-pay | Admitting: Podiatry

## 2016-11-22 ENCOUNTER — Ambulatory Visit (HOSPITAL_BASED_OUTPATIENT_CLINIC_OR_DEPARTMENT_OTHER)
Admission: RE | Admit: 2016-11-22 | Discharge: 2016-11-22 | Disposition: A | Discharge status: Home / Self Care | Payer: BLUE CROSS/BLUE SHIELD | Source: Ambulatory Visit | Attending: Podiatry | Admitting: Podiatry

## 2016-11-22 DIAGNOSIS — M86572 Other chronic hematogenous osteomyelitis, left ankle and foot: Secondary | ICD-10-CM | POA: Diagnosis not present

## 2016-11-22 DIAGNOSIS — L97422 Non-pressure chronic ulcer of left heel and midfoot with fat layer exposed: Secondary | ICD-10-CM

## 2016-11-22 DIAGNOSIS — L97409 Non-pressure chronic ulcer of unspecified heel and midfoot with unspecified severity: Secondary | ICD-10-CM | POA: Diagnosis not present

## 2016-11-22 DIAGNOSIS — L97501 Non-pressure chronic ulcer of other part of unspecified foot limited to breakdown of skin: Secondary | ICD-10-CM

## 2016-11-22 MED ORDER — SILVER SULFADIAZINE 1 % EX CREA: 1.0000 "application " | TOPICAL_CREAM | Freq: Every day | CUTANEOUS | 0 refills | Status: DC

## 2016-11-22 NOTE — Progress Notes (Signed)
Subjective: [name] [name] is a 51 y.o. is seen today in office for follow-up evaluation of wound to the bottom of the left heel. She has been using the Santyl to the wound daily she states the wound is doing much better. She has not had any drainage or pus and she denies any sign of redness or red streaks or any swelling to her foot. She also states that her left big toenails becoming dark in color and she continues to get dry skin to the tip of the toe which does crack and bleed at times. Denies any pus or swelling. She has no other concerns today. She denies any systemic complaints such as fevers, chills, nausea, vomiting. Denies any calf pain, chest pain, shortness of breath.  Objective: General: No acute distress, AAOx3  DP/PT pulses palpable 2/4, CRT < 3 sec to all digits.  Sensation decreased with SWMF. Motor function intact.  On the plantar aspect of the left heel continues to be a wound which measures probably 2 x 1.8 which is bothersome in diameter however does have however granulation tissue present althoughmild. There is no probing, undermining or tunneling. There is no surroundingerythema, ascending cellulitis. There is no drainage or pus. There is no malodor. There is no clinical signs of infection noted. He left hallux toenails thickened with darkened discoloration which appears to be subungual hematoma. The nails very brittle and almost completely removed today turn debridement. There is no extension of any hyperpigmentation into the surrounding skin. There is superficial wounds to the cracked skin to the distal aspect of the hallux. There is no drainage or pus in there is no bleeding. There is no edema, erythema. No other open lesions or pre-ulcerative lesions.  No pain with calf compression, swelling, warmth, erythema.   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   -Treatment options  discussed including all alternatives, risks, and complications -Silvadene was applied to the wound on the plantar across the left foot due to hyperkeratotic granulation tissue. Discussed with her that possible grafting this option at this point. As his been a chronic wound and much improved however it does continue. There is no clinical signs of infection noted. X-ray to evaluate for the bone quality to ensure that there is no acute bony destruction. Undergoing order graft her back in 2 weeks for likely application. This is medically necessary at this point given the longevity of wound. Continue surgical shoe and offloading. -Sharply debrided the left hallux toenail and this was completely removed today. I prescribed Silvadene for the wound to the distal aspect hallux. -RTC 2 weeks or sooner if needed.   Ovid Curd, DPM

## 2016-11-25 ENCOUNTER — Telehealth: Payer: Self-pay

## 2016-11-25 ENCOUNTER — Telehealth: Payer: Self-pay | Admitting: Podiatry

## 2016-11-25 NOTE — Telephone Encounter (Signed)
Spoke with patient informing her of xray results. She states that her foot is doing well, no pain at this time.

## 2016-11-25 NOTE — Telephone Encounter (Signed)
Hello, I was returning a call to Shanda Bumps that I just missed. My number is [phone]. Thank you.

## 2016-11-25 NOTE — Telephone Encounter (Signed)
-----   Message from [name], DPM sent at 11/24/2016  6:55 AM EDT ----- Please let her know that her x-ray is stable and there does not appear to be any progressive bony changes. Thank you.

## 2016-12-01 ENCOUNTER — Telehealth: Payer: Self-pay | Admitting: *Deleted

## 2016-12-01 NOTE — Telephone Encounter (Addendum)
Left message informing pt, our office had received Benefits Verification for the graft Dr. Ardelle Anton wanted to apply and to call for the information.12/06/2016-I informed pt the Benefits Verification for EpiFix would be $40.00 Co-pay per application. Pt states she would like to have the graft.

## 2016-12-13 ENCOUNTER — Ambulatory Visit: Payer: BLUE CROSS/BLUE SHIELD | Admitting: Podiatry

## 2016-12-13 ENCOUNTER — Telehealth: Payer: Self-pay | Admitting: *Deleted

## 2016-12-13 ENCOUNTER — Ambulatory Visit (INDEPENDENT_AMBULATORY_CARE_PROVIDER_SITE_OTHER): Payer: BLUE CROSS/BLUE SHIELD | Admitting: Podiatry

## 2016-12-13 ENCOUNTER — Encounter: Payer: Self-pay | Admitting: Podiatry

## 2016-12-13 DIAGNOSIS — E08621 Diabetes mellitus due to underlying condition with foot ulcer: Secondary | ICD-10-CM

## 2016-12-13 DIAGNOSIS — L97422 Non-pressure chronic ulcer of left heel and midfoot with fat layer exposed: Secondary | ICD-10-CM

## 2016-12-13 DIAGNOSIS — L97501 Non-pressure chronic ulcer of other part of unspecified foot limited to breakdown of skin: Secondary | ICD-10-CM

## 2016-12-13 NOTE — Progress Notes (Signed)
Subjective: [name] [name] is a 51 y.o. is seen today in office for follow-up evaluation of wound to the bottom of the left heel. She states that she is doing well she has not noticed anydrainage or pus. She feels that the wound is getting much better and compared to when we first started is much improved however the wound does continue.she also has been using Silvadene to the big toe on the wound an the does does get "crusty". Denies any drainage or pus or redness coming from this area. She has no other concerns today.She denies any systemic complaints such as fevers, chills, nausea, vomiting. Denies any calf pain, chest pain, shortness of breath.  Objective: General: No acute distress, AAOx3  DP/PT pulses palpable 2/4, CRT < 3 sec to all digits.  Sensation decreased with SWMF. Motor function intact.  On the plantar aspect of the left heel continues to be a wound which measures probably 1.8 x 1.7 which has improved. There is a depth approximately 0.2 cm along the periphery and is hyper granulation tissue present within the central aspect of the wound. There is no surrounding erythema, ascending cellulitis. There is no fluctuance or crepitus. There is no malodor. Superficial abrasion type wound the distal aspect the left hallux. There is no surrounding erythema, ascending cellulitis. His infections or crepitus. There is no malodor. No other open lesions or pre-ulcerative lesions.  No pain with calf compression, swelling, warmth, erythema.   Assessment and Plan:  Left chronic heel ulceration; left hallux ulceration  -Treatment options discussed including all alternatives, risks, and complications -cellulitis the plantar aspect of left heel was sharply debrided to healthy, granular tissue. Hemostasis was achieved. Area was cleaned. At this time.want to go ahead and proceed with a grafting as this is medically appropriate and she has attempted numerous conservative treatments  Without any resolution of  the wound. Today a 24mm EpiFix graft (Expiation 7/02/28/2021 reorder numbner [id number]; [id number])was applied to the wound followed by Adaptic and Steri-Strips for reinforcement. Entire graft was used. A bolster dressing was then applied.she can change the outer bandage as needed only the inner bandage intact. Continue to wear surgical shoe with offloading as well as to limit weightbearing as much as possible. -Continue Silvadene to the toe ulceration. -Monitor for any clinical signs or symptoms of infection and directed to call the office immediately should any occur or go to the ER. -RTC 1 week for reapplication of the graft likely  Ovid Curd, DPM

## 2016-12-13 NOTE — Telephone Encounter (Addendum)
-----   Message from [name], DPM sent at 12/13/2016 10:20 AM EDT ----- Can you please go ahead and order another graft for her, same size. I told Tawanna Cooler as well. [facility] states he ordered the 24mm EpiFix for 12/20/2016.

## 2016-12-20 ENCOUNTER — Ambulatory Visit (INDEPENDENT_AMBULATORY_CARE_PROVIDER_SITE_OTHER): Payer: BLUE CROSS/BLUE SHIELD | Admitting: Podiatry

## 2016-12-20 ENCOUNTER — Encounter: Payer: Self-pay | Admitting: Podiatry

## 2016-12-20 DIAGNOSIS — L97422 Non-pressure chronic ulcer of left heel and midfoot with fat layer exposed: Secondary | ICD-10-CM

## 2016-12-20 DIAGNOSIS — E08621 Diabetes mellitus due to underlying condition with foot ulcer: Secondary | ICD-10-CM

## 2016-12-21 ENCOUNTER — Telehealth: Payer: Self-pay | Admitting: *Deleted

## 2016-12-21 NOTE — Telephone Encounter (Addendum)
-----   Message from Vivi BarrackMatthew R Wagoner, DPM sent at 12/20/2016 11:20 AM EDT ----- Can you go ahead and order a graft for next week as well? Thanks. 12/21/2016-[facility] for 3rd 24mm EpiFix for application on 12/27/2016.

## 2016-12-21 NOTE — Progress Notes (Signed)
Subjective: [name] [name] is a 51 y.o. is seen today in office for follow-up evaluation of wound to the bottom of the left heel. She states that she is doing well. She has changed the outer bandage once but did not mess with the inner bandage. She presents today for further evaluation and possible reapplication of the graft. She denies any systemic complaints such as fevers, chills, nausea, vomiting. Denies any calf pain, chest pain, shortness of breath.  Objective: General: No acute distress, AAOx3  DP/PT pulses palpable 2/4, CRT < 3 sec to all digits.  Sensation decreased with SWMF. Motor function intact.  On the plantar aspect of the left heel continues to be a wound which measures probably 1.8 x 1.6. The wound base is granular and the edges appear to be coming in. There is no probing, undermining or tunneling. There is no swelling erythema, ascending cellulitis. There is no/crepitus. There is no malodor. Hallux ulceration is superficial and healing well and is an abrasion type lesion.  No other open lesions or pre-ulcerative lesions.  No pain with calf compression, swelling, warmth, erythema.   Assessment and Plan:  Left chronic heel ulceration; left hallux ulceration  -Treatment options discussed including all alternatives, risks, and complications -Wound appears to be doing better. I believe that going ahead and proceed with a second graft is warranted at this time. Today a 24mm EpiFix graft (Expiation 7/02/28/2021 reorder numbner [id number]; [id number])was applied to the wound followed by Adaptic and Steri-Strips for reinforcement. Entire graft was used. A bolster dressing was then applied.she can change the outer bandage as needed only the inner bandage intact. Continue to wear surgical shoe with offloading as well as to limit weightbearing as much as possible. -Continue Silvadene to the toe ulceration. -Monitor for any clinical signs or symptoms of infection and directed to call the  office immediately should any occur or go to the ER. -RTC 1 week for reapplication of the graft likely  Ovid CurdMatthew Dare Spillman, DPM

## 2016-12-27 ENCOUNTER — Encounter: Payer: Self-pay | Admitting: Podiatry

## 2016-12-27 ENCOUNTER — Ambulatory Visit (INDEPENDENT_AMBULATORY_CARE_PROVIDER_SITE_OTHER): Payer: BLUE CROSS/BLUE SHIELD | Admitting: Podiatry

## 2016-12-27 DIAGNOSIS — E08621 Diabetes mellitus due to underlying condition with foot ulcer: Secondary | ICD-10-CM | POA: Diagnosis not present

## 2016-12-27 DIAGNOSIS — L97422 Non-pressure chronic ulcer of left heel and midfoot with fat layer exposed: Secondary | ICD-10-CM | POA: Diagnosis not present

## 2016-12-28 NOTE — Progress Notes (Signed)
Subjective: [name] [name] is a 51 y.o. is seen today in office for follow-up evaluation of wound to the bottom of the left heel. She states that she's not having change the bandage and she has not had any issues. She's doing the surgical shoe with offloading. She denies any systemic complaints as fevers, chills, nausea component. No calf pain, chest pain, shortness breath.  Objective: General: No acute distress, AAOx3  DP/PT pulses palpable 2/4, CRT < 3 sec to all digits.  Sensation decreased with SWMF. Motor function intact.  Remove the bandage today that I applied last appointment. After removal on the plantar aspect of the left heel continues to be a wound which measures probably 1.5 x 1.5 and is superficial. The wound appears to more granular and skin edges or come in. There is no depth of the wound overall the wound appears to be clinically improved. There is no swelling erythema, ascending synovitis. His infections or crepitus. There is no malodor.  Superficial abrasion type wound the distal aspect the left hallux. There is no surrounding erythema, ascending cellulitis. His infections or crepitus. There is no malodor. No other open lesions or pre-ulcerative lesions.  No pain with calf compression, swelling, warmth, erythema.   Assessment and Plan:  Left chronic heel ulceration; left hallux ulceration  -Treatment options discussed including all alternatives, risks, and complications -I lightly debrided the wound to the plantar aspect of the left heel without any complications. Hemostasis was achieved in the wound was cleaned. At this point I believe is medically necessary going to proceed with a third graft. Today a 24mm EpiFix graft was applied to the wound followed by Adaptic and Steri-Strips for reinforcement.The entiret was used. A bolster dressing was then applied.she can change the outer bandage as needed only the inner bandage intact. Continue to wear surgical shoe with offloading as  well as to limit weightbearing as much as possible. -Continue Silvadene to the toe ulceration. -Monitor for any clinical signs or symptoms of infection and directed to call the office immediately should any occur or go to the ER. -RTC 1 week for reapplication of the graft likely  [name] [name], DPM

## 2016-12-29 ENCOUNTER — Telehealth: Payer: Self-pay | Admitting: *Deleted

## 2016-12-29 NOTE — Telephone Encounter (Signed)
-----   Message from Vivi BarrackMatthew R Wagoner, DPM sent at 12/28/2016  1:02 PM EDT ----- Can you please order another graft for next week? Same as previousl Thanks.

## 2016-12-29 NOTE — Telephone Encounter (Signed)
[facility] order [facility] pt's EpiFix for

## 2017-01-03 ENCOUNTER — Ambulatory Visit (INDEPENDENT_AMBULATORY_CARE_PROVIDER_SITE_OTHER): Payer: BLUE CROSS/BLUE SHIELD | Admitting: Podiatry

## 2017-01-03 ENCOUNTER — Encounter: Payer: Self-pay | Admitting: Podiatry

## 2017-01-03 DIAGNOSIS — L97422 Non-pressure chronic ulcer of left heel and midfoot with fat layer exposed: Secondary | ICD-10-CM | POA: Diagnosis not present

## 2017-01-03 DIAGNOSIS — L97501 Non-pressure chronic ulcer of other part of unspecified foot limited to breakdown of skin: Secondary | ICD-10-CM | POA: Diagnosis not present

## 2017-01-03 NOTE — Progress Notes (Signed)
Subjective: Flora LippsKerrin D Twombly is a 51 y.o. is seen today in office for follow-up evaluation of wound to the bottom of the left heel.  She states she has been doing well and she is having no issues. She is eagar to get out of the surgical shoe. She denies any systemic complaints as fevers, chills, nausea component. No calf pain, chest pain, shortness breath.  Objective: General: No acute distress, AAOx3  DP/PT pulses palpable 2/4, CRT < 3 sec to all digits.  Sensation decreased with SWMF. Motor function intact.  Remove the bandage today that I applied last appointment. Wound today measures 1.2 x 1.1 synovators in a superficial granular wound base. There is mild macerated tissue around the wound. Is no surrounding erythema, ascending cellulitis. There is no fluctuance or crepitus. There is no malodor. Superficial abrasion type lesion of the distal last of the hallux without any redness or drainage or pus in there is no signs of infection in this area as well. No other open lesions or pre-ulcerative lesions.  No pain with calf compression, swelling, warmth, erythema.   Assessment and Plan:  Left chronic heel ulceration; left hallux ulceration  -Treatment options discussed including all alternatives, risks, and complications -At this point given the macerated tissue around the wound I want to get it a break from the graft. The wound actually is healing well and she also states it is looking much better. Recommend a small amount of Betadine to the area daily as well as a dry dressing over top of that. Continue a surgical shoe at all times. Elevation. Limit activity. Monitor for any clinical signs or symptoms of infection and directed to call the office immediately should any occur or go to the ER. -RTC 1 week or sooner if any issues are to arise.    Ovid CurdMatthew Inioluwa Boulay, DPM

## 2017-01-10 ENCOUNTER — Ambulatory Visit: Payer: BLUE CROSS/BLUE SHIELD | Admitting: Podiatry

## 2017-01-13 ENCOUNTER — Ambulatory Visit (INDEPENDENT_AMBULATORY_CARE_PROVIDER_SITE_OTHER): Payer: BLUE CROSS/BLUE SHIELD | Admitting: Podiatry

## 2017-01-13 ENCOUNTER — Encounter: Payer: Self-pay | Admitting: Podiatry

## 2017-01-13 DIAGNOSIS — L97501 Non-pressure chronic ulcer of other part of unspecified foot limited to breakdown of skin: Secondary | ICD-10-CM | POA: Diagnosis not present

## 2017-01-13 DIAGNOSIS — L97422 Non-pressure chronic ulcer of left heel and midfoot with fat layer exposed: Secondary | ICD-10-CM

## 2017-01-16 NOTE — Progress Notes (Signed)
Subjective: [name] [name] is a 51 y.o. is seen today in office for follow-up evaluation of wound to the bottom of the left heel.  She states that heel is doing better.  She has not noticed any drainage or pus coming from the area or any redness or swelling.  She does have new concerns today of her left big toe wound has gotten worse and is opened up.  She is not sure how this happened.  Denies any drainage or pus coming from the area.  She has no other concerns today. She denies any systemic complaints as fevers, chills, nausea component. No calf pain, chest pain, shortness breath.  Objective: General: No acute distress, AAOx3  DP/PT pulses palpable 2/4, CRT < 3 sec to all digits.  Sensation decreased with SWMF. Motor function intact.  Remove the bandage today that I applied last appointment. Wound today measures 1 x 1 cm and is  superficial granular wound base. There is no macerated tissue around the wound. There is no surrounding erythema, ascending cellulitis. There is no fluctuance or crepitus. There is no malodor.  To the distal aspect of the plantar left hallux ulceration with hyper granulation tissue measuring 1.5 x 1.5 cm.  There is no surrounding erythema, ascending cellulitis.  There is no fluctuance or crepitus.  There is no malodor.  There is no drainage or pus. No other open lesions or pre-ulcerative lesions.  No pain with calf compression, swelling, warmth, erythema.   Assessment and Plan:  Left chronic heel ulceration; left hallux ulceration which has worsened  -Treatment options discussed including all alternatives, risks, and complications -The wounds are clean.  Silver nitrate was applied to both of the wounds today.  Continue with Silvadene dressing changes daily.  Recommend offloading at all times.  New surgical shoe.  We will hold off on the graft today. Monitor for any clinical signs or symptoms of infection and directed to call the office immediately should any occur or go to  the ER. -RTC 10 days or sooner if needed.  Vivi BarrackMatthew R Wagoner DPM

## 2017-01-24 ENCOUNTER — Ambulatory Visit (INDEPENDENT_AMBULATORY_CARE_PROVIDER_SITE_OTHER): Payer: BLUE CROSS/BLUE SHIELD | Admitting: Podiatry

## 2017-01-24 DIAGNOSIS — E08622 Diabetes mellitus due to underlying condition with other skin ulcer: Secondary | ICD-10-CM

## 2017-01-24 DIAGNOSIS — L97501 Non-pressure chronic ulcer of other part of unspecified foot limited to breakdown of skin: Secondary | ICD-10-CM | POA: Diagnosis not present

## 2017-01-24 DIAGNOSIS — E08621 Diabetes mellitus due to underlying condition with foot ulcer: Secondary | ICD-10-CM | POA: Diagnosis not present

## 2017-01-24 DIAGNOSIS — L97422 Non-pressure chronic ulcer of left heel and midfoot with fat layer exposed: Secondary | ICD-10-CM

## 2017-01-24 DIAGNOSIS — M86572 Other chronic hematogenous osteomyelitis, left ankle and foot: Secondary | ICD-10-CM

## 2017-01-24 MED ORDER — SILVER NITRATE-POT NITRATE 75-25 % EX MISC: CUTANEOUS | 0 refills | Status: DC

## 2017-01-24 NOTE — Progress Notes (Signed)
lvSubjective: [name] [name] is a 51 y.o. is seen today in office for follow-up evaluation of wound to the bottom of the left heel and on the bottom of the big toe.  She states that the heel is doing better but the toe wound is gotten somewhat bigger.  She denies any drainage or pus coming from the area she denies any swelling to her feet or any redness or warmth.  She has no other concerns today. She denies any systemic complaints as fevers, chills, nausea component. No calf pain, chest pain, shortness breath.  Objective: General: No acute distress, AAOx3  DP/PT pulses palpable 2/4, CRT < 3 sec to all digits.  Sensation decreased with SWMF. Motor function intact.  The wound to the plantar aspect of the hallux appears to be slightly larger today.  The wound today measures 1.6 x 1.6 with hyper granulation within the wound bed. On the plantar aspect of the heel the wound measures slightly larger 1.1 x 1.1 0.1 cm with a granular wound bed.  There is no drainage or pus expressed from the wounds.  There is no edema, erythema to her foot.  There is no clinical signs of infection noted today. No other open lesions or pre-ulcerative lesions.  No pain with calf compression, swelling, warmth, erythema.   Assessment and Plan:  Left chronic heel ulceration; left hallux ulceration   -Treatment options discussed including all alternatives, risks, and complications -Left hallux wound was debrided today.  Silvadene was applied as was also prescribed for her today to apply 3 times a week. -In regards to the left plantar heel wound the wound continues to be about the same size and given the longevity nature and numerous conservative treatments as well as previous surgical debridement I would like to go ahead and proceed with a graft application as I believe it is medically necessary at this point to ensure healing to help prevent amputation and infection of the wound.  Today Epifex 24mm was applied to the wound and  the entire graft was used.  Steri-Strips were utilized to secure the wound followed by Adaptic and the bolster dressing.  Keep the dressing clean, dry, intact. -She has a surgical shoe at home she should wear this at all time and I continue to recommend nonweightbearing -Follow-up in 1 week or sooner if any issues arise.  [name] BarrackMatthew R [name] DPM

## 2017-01-25 ENCOUNTER — Telehealth: Payer: Self-pay | Admitting: *Deleted

## 2017-01-25 NOTE — Telephone Encounter (Signed)
Dr. Ardelle AntonWagoner applied 5th EpiFix to pt 01/24/2017.

## 2017-01-26 NOTE — Telephone Encounter (Signed)
Hadley PenLisa Cox, CMA - Dr. Gabriel RungWagoner's assistant states Dr. Ardelle AntonWagoner would like to order EpiFix for pt's left great toe ulcer.

## 2017-01-26 NOTE — Telephone Encounter (Signed)
Done

## 2017-01-26 NOTE — Telephone Encounter (Signed)
Completed required form, clinicals and demographics for [facility], waiting updated clinicals and Dr.[facility]agoner's [facility]

## 2017-01-27 NOTE — Telephone Encounter (Signed)
Faxed required form, clinical and demographics to [facility] for left 1st toe wound.

## 2017-01-31 ENCOUNTER — Ambulatory Visit (INDEPENDENT_AMBULATORY_CARE_PROVIDER_SITE_OTHER): Payer: BLUE CROSS/BLUE SHIELD | Admitting: Podiatry

## 2017-01-31 ENCOUNTER — Encounter: Payer: Self-pay | Admitting: Podiatry

## 2017-01-31 DIAGNOSIS — L97501 Non-pressure chronic ulcer of other part of unspecified foot limited to breakdown of skin: Secondary | ICD-10-CM | POA: Diagnosis not present

## 2017-01-31 DIAGNOSIS — L97422 Non-pressure chronic ulcer of left heel and midfoot with fat layer exposed: Secondary | ICD-10-CM

## 2017-02-01 NOTE — Progress Notes (Signed)
lvSubjective: [name] [name] is a 51 y.o. is seen today in office for follow-up evaluation of wound to the bottom of the left heel and on the bottom of the big toe.  She states that she has an odor to her foot but she has not change the bandage since I last saw her.  She is also been using the silver nitrate sticks to the wound and the big toe but she has not thinking that this is been helping.  She denies any pus coming from the area she denies any increase in redness or swelling to her foot she denies any red streaks.  She has no other concerns today.  She has been wearing the surgical shoe. She denies any systemic complaints as fevers, chills, nausea component. No calf pain, chest pain, shortness breath.  Objective: General: No acute distress, AAOx3  DP/PT pulses palpable 2/4, CRT < 3 sec to all digits.  Sensation decreased with SWMF. Motor function intact.  The wound to the plantar aspect of the hallux is about the same size today.  The wound today measures 1.6 x 1.6 with hyper granulation within the wound bed.  There is hyperventilation tissue still present within the wound bed. On the plantar aspect of the heel the wound measures  1 x 1 0.1 cm with a granular wound bed.  Mild macerated skin around the wound.  There is no drainage or pus expressed from the wounds.  There is no edema, erythema to her foot.  There is no clinical signs of infection noted today. No other open lesions or pre-ulcerative lesions.  No pain with calf compression, swelling, warmth, erythema.   Assessment and Plan:  Left chronic heel ulceration; left hallux ulceration   -Treatment options discussed including all alternatives, risks, and complications -Today I sharply debrided the hypercoagulation tissue to the left hallux.  Hemostasis was achieved and the wound was cleaned.  Silver nitrate was applied.  Continue with this as well. -Dressing was removed to the right heel.  Wound appears to be more superficial and healing  well.  We will do a Betadine wet-to-dry to this area for now. -Continue offloading at all times and continue with surgical shoe. -Monitor for any clinical signs or symptoms of infection and directed to call the office immediately should any occur or go to the ER. -Follow-up in 1 week or sooner if any issues are to arise.  She agrees with this plan has no further questions.  Vivi BarrackMatthew R Edker Punt DPM

## 2017-02-07 ENCOUNTER — Ambulatory Visit: Payer: BLUE CROSS/BLUE SHIELD | Admitting: Podiatry

## 2017-02-14 ENCOUNTER — Encounter: Payer: Self-pay | Admitting: Podiatry

## 2017-02-14 ENCOUNTER — Ambulatory Visit (INDEPENDENT_AMBULATORY_CARE_PROVIDER_SITE_OTHER): Payer: BLUE CROSS/BLUE SHIELD | Admitting: Podiatry

## 2017-02-14 ENCOUNTER — Telehealth: Payer: Self-pay | Admitting: *Deleted

## 2017-02-14 DIAGNOSIS — L97501 Non-pressure chronic ulcer of other part of unspecified foot limited to breakdown of skin: Secondary | ICD-10-CM

## 2017-02-14 DIAGNOSIS — L97422 Non-pressure chronic ulcer of left heel and midfoot with fat layer exposed: Secondary | ICD-10-CM | POA: Diagnosis not present

## 2017-02-14 NOTE — Telephone Encounter (Signed)
-----   Message from Vivi BarrackMatthew R Wagoner, DPM sent at 02/14/2017  9:53 AM EST ----- Can you please order 2 grafts for her?  Wounds with healing one is for the big toe.  I believe there is authorization for both.  Chip BoerVicki- any updates on her status? Sorry to keep bothering you about this.

## 2017-02-14 NOTE — Telephone Encounter (Signed)
Emailed order for 6th 24mm Epifix for left heel, and 1st 24mm EpiFix for left hallux from [facility] for application 02/24/2017.

## 2017-02-14 NOTE — Progress Notes (Signed)
lvSubjective: [name] [name] is a 51 y.o. is seen today in office for follow-up evaluation of wound to the bottom of the left heel and on the bottom of the big toe.  She has been putting Betadine to the wound on the heel but changes every other day.  She is also been using the silver nitrate to the left big toe wound and this is been helping.  She states that the toe wound appears to be about the same size but it is not is "growing out" as it was.  She denies any increase in swelling to her feet she denies any redness or drainage.  She denies any pus.  She has no pain.  She has been wearing a surgical shoe intermittently.   She denies any systemic complaints as fevers, chills, nausea component. No calf pain, chest pain, shortness breath.  Objective: General: No acute distress, AAOx3  DP/PT pulses palpable 2/4, CRT < 3 sec to all digits.  Sensation decreased with SWMF. Motor function intact.  The wound to the plantar aspect of the hallux is about the same size today.  The wound today measures 1.4 x 1.1 with mild hyper granulation within the wound bed.  No surrounding erythema, ascending sialitis.  There is no fluctuation or crepitation.  There is no malodor. On the plantar aspect of the heel the wound measures  12 x 1.1 cm with a granular wound bed.  Mild macerated skin around the wound however this is somewhat improved.  There is no drainage or pus expressed from the wounds.  The dressing was firmly adhered to the skin.  There is no edema, erythema to her foot.  There is no clinical signs of infection noted today. No other open lesions or pre-ulcerative lesions.  No pain with calf compression, swelling, warmth, erythema.   Assessment and Plan:  Left chronic heel ulceration; left hallux ulceration   -Treatment options discussed including all alternatives, risks, and complications -Today I sharply debrided the hypercoagulation tissue to the left hallux as well as the hyperkeratotic periwound.   Hemostasis was achieved and the wound was cleaned.  Silver nitrate was applied.  Continue with this as well. -Dressing was removed to the right heel.  Wound appears to be more superficial and healing well.  Silver nitrate was also applied to this.  She can do a small amount of Betadine but needs to change his bandage daily. -Continue offloading at all times and continue with surgical shoe. -As the wounds are the same size and only minimal healing I have recommended graft application to both wounds.  I do believe this is medically necessary at this point.  We will get the grafts ordered. -Monitor for any clinical signs or symptoms of infection and directed to call the office immediately should any occur or go to the ER. -Follow-up in 1 week or sooner if any issues are to arise.  She agrees with this plan has no further questions.  [name] [name] DPM

## 2017-02-24 ENCOUNTER — Ambulatory Visit: Payer: BLUE CROSS/BLUE SHIELD | Admitting: Podiatry

## 2017-03-02 ENCOUNTER — Ambulatory Visit (INDEPENDENT_AMBULATORY_CARE_PROVIDER_SITE_OTHER): Payer: BLUE CROSS/BLUE SHIELD | Admitting: Podiatry

## 2017-03-02 ENCOUNTER — Encounter: Payer: Self-pay | Admitting: Podiatry

## 2017-03-02 ENCOUNTER — Ambulatory Visit (INDEPENDENT_AMBULATORY_CARE_PROVIDER_SITE_OTHER): Payer: BLUE CROSS/BLUE SHIELD

## 2017-03-02 VITALS — BP 142/86 | HR 89 | Temp 97.6°F | Resp 18

## 2017-03-02 DIAGNOSIS — L97422 Non-pressure chronic ulcer of left heel and midfoot with fat layer exposed: Secondary | ICD-10-CM

## 2017-03-02 DIAGNOSIS — L03032 Cellulitis of left toe: Secondary | ICD-10-CM

## 2017-03-02 DIAGNOSIS — L02612 Cutaneous abscess of left foot: Secondary | ICD-10-CM

## 2017-03-02 MED ORDER — AMOXICILLIN-POT CLAVULANATE 875-125 MG PO TABS: 1.0000 | ORAL_TABLET | Freq: Two times a day (BID) | ORAL | 0 refills | Status: DC

## 2017-03-03 NOTE — Progress Notes (Signed)
Subjective: [name] [name] presents the office today for evaluation of left heel and hallux ulceration.  She says the heel is doing very well and looks almost "normal".  She denies any drainage or pus coming from this area.  She states the big toe is doing about the same.  Has been putting silver nitrate on this area a couple times a week.  She also has new concerns that she scratched the top of her toe and the the area bleed and she was having some dry skin.  Since then she has noticed some swelling and redness of the toe.  She was concerned about that and she called to make an appointment.  This is been ongoing for the last couple of days.  She denies any red streaks.  Overall she still states that she feels well and she denies any systemic complaints such as fevers, chills, nausea, vomiting. No acute changes since last appointment, and no other complaints at this time.   Objective: AAO x3, NAD DP/PT pulses palpable bilaterally, CRT less than 3 seconds To the plantar heel ulceration the wound is much improved and measures approximately 0.7 x 0.6 cm and is superficial.  There is a granular wound base with mild hyperkeratotic periwound.  There is no probing, undermining or tunneling.  There is no surrounding erythema, ascending cellulitis.  There is no fluctuance or crepitus.  There is no malodor.  To the left hallux on the plantar hallux ulceration continues but there is no significant hypercoagulation tissue present.  The wound measures about the same size as 1.4 x 1.0 cm.  There is one central area that probes approximately 0.4 cm but does not probe to bone.  There is no undermining or tunneling.  Small scratches present, superficial abrasion to the dorsal aspect of the hallux and there is an adjacent what appears to be a blister along the lateral aspect hallux corresponding to this area.  The blister does not correspond communicate with the hallux ulceration.  I was able to open this area a small amount of  purulence was expressed and this was cultured.  There is no malodor.  No areas of fluctuance or crepitus. No open lesions or pre-ulcerative lesions.  No pain with calf compression, swelling, warmth, erythema        Assessment: Healing left heel ulceration with cellulitis of the left hallux; superficial abscess  Plan: -All treatment options discussed with the patient including all alternatives, risks, complications.  -The heel is doing well.  Sharply debrided today without any complications.  We will do a small amount of Betadine to this area daily. -In regards to the hallux I did sharply debride the wound today to remove the hyperkeratotic tissue was debrided to healthy, granular tissue.  After debridement the wound appeared to be much improved.  The picture about this before debridement of the wound after debridement was a healthy, granular wound.  However there was a blister along the lateral aspect hallux which I did drain a small amount of purulence was expressed and this was sent for culture.  Prescribed Augmentin today.  We will do a small amount of Betadine to the blister as well as the wound daily. -X-rays were obtained and reviewed.  Likely chronic change of the distal phalanx of the hallux we will repeat x-rays next appointment. -Monitor for any clinical signs or symptoms of infection and directed to call the office immediately should any occur or go to the ER. -Patient encouraged to call the office with  any questions, concerns, change in symptoms.   [name] [name] DPM

## 2017-03-05 LAB — WOUND CULTURE
MICRO NUMBER: 90008912
SPECIMEN QUALITY: ADEQUATE

## 2017-03-06 ENCOUNTER — Ambulatory Visit: Payer: BLUE CROSS/BLUE SHIELD | Admitting: Podiatry

## 2017-03-06 ENCOUNTER — Other Ambulatory Visit: Payer: Self-pay | Admitting: Podiatry

## 2017-03-06 MED ORDER — CLINDAMYCIN HCL 300 MG PO CAPS: 300.0000 mg | ORAL_CAPSULE | Freq: Three times a day (TID) | ORAL | 2 refills | Status: DC

## 2017-03-06 NOTE — Progress Notes (Signed)
Switched to clindamycin

## 2017-03-07 ENCOUNTER — Telehealth: Payer: Self-pay | Admitting: *Deleted

## 2017-03-07 ENCOUNTER — Ambulatory Visit: Payer: BLUE CROSS/BLUE SHIELD | Admitting: Podiatry

## 2017-03-07 NOTE — Telephone Encounter (Signed)
Left message informing pt, due to the importance I would be leaving a message on her phone, that Dr. Ardelle AntonWagoner had ordered clindamycin antibiotic at [id number].

## 2017-03-07 NOTE — Telephone Encounter (Signed)
Pt states she received my call with results and orders for the new antibiotic. I left message informing pt Dr. Ardelle AntonWagoner had reviewed the wound culture results and changed the antibiotic to clindamycin that had been sent to the [facility] at [address] and to stop the other antibiotic.

## 2017-03-07 NOTE — Telephone Encounter (Signed)
-----   Message from Vivi BarrackMatthew R Wagoner, DPM sent at 03/06/2017  4:18 PM EST ----- Val- I have ordered clindamyin due to wound culture. Can you please call her to let her know. Thanks.

## 2017-03-09 ENCOUNTER — Emergency Department (HOSPITAL_BASED_OUTPATIENT_CLINIC_OR_DEPARTMENT_OTHER)
Admission: EM | Admit: 2017-03-09 | Discharge: 2017-03-10 | Disposition: A | Discharge status: Home / Self Care | Payer: BLUE CROSS/BLUE SHIELD | Attending: Emergency Medicine | Admitting: Emergency Medicine

## 2017-03-09 ENCOUNTER — Other Ambulatory Visit: Payer: Self-pay

## 2017-03-09 ENCOUNTER — Emergency Department (HOSPITAL_BASED_OUTPATIENT_CLINIC_OR_DEPARTMENT_OTHER): Payer: BLUE CROSS/BLUE SHIELD

## 2017-03-09 ENCOUNTER — Encounter (HOSPITAL_BASED_OUTPATIENT_CLINIC_OR_DEPARTMENT_OTHER): Payer: Self-pay | Admitting: *Deleted

## 2017-03-09 DIAGNOSIS — E10649 Type 1 diabetes mellitus with hypoglycemia without coma: Secondary | ICD-10-CM | POA: Diagnosis not present

## 2017-03-09 DIAGNOSIS — Z79899 Other long term (current) drug therapy: Secondary | ICD-10-CM | POA: Insufficient documentation

## 2017-03-09 DIAGNOSIS — E039 Hypothyroidism, unspecified: Secondary | ICD-10-CM | POA: Diagnosis not present

## 2017-03-09 DIAGNOSIS — R112 Nausea with vomiting, unspecified: Secondary | ICD-10-CM | POA: Diagnosis not present

## 2017-03-09 DIAGNOSIS — Z794 Long term (current) use of insulin: Secondary | ICD-10-CM | POA: Insufficient documentation

## 2017-03-09 DIAGNOSIS — N19 Unspecified kidney failure: Secondary | ICD-10-CM | POA: Diagnosis not present

## 2017-03-09 DIAGNOSIS — R1084 Generalized abdominal pain: Secondary | ICD-10-CM | POA: Diagnosis present

## 2017-03-09 LAB — COMPREHENSIVE METABOLIC PANEL
ALK PHOS: 133 U/L — AB (ref 38–126)
ALT: 10 U/L — AB (ref 14–54)
AST: 26 U/L (ref 15–41)
Albumin: 3.3 g/dL — ABNORMAL LOW (ref 3.5–5.0)
Anion gap: 21 — ABNORMAL HIGH (ref 5–15)
BUN: 25 mg/dL — AB (ref 6–20)
CALCIUM: 12.5 mg/dL — AB (ref 8.9–10.3)
CO2: 28 mmol/L (ref 22–32)
CREATININE: 2.07 mg/dL — AB (ref 0.44–1.00)
Chloride: 88 mmol/L — ABNORMAL LOW (ref 101–111)
GFR calc Af Amer: 31 mL/min — ABNORMAL LOW (ref >60)
GFR calc non Af Amer: 27 mL/min — ABNORMAL LOW (ref >60)
Glucose, Bld: 264 mg/dL — ABNORMAL HIGH (ref 65–99)
Potassium: 3.8 mmol/L (ref 3.5–5.1)
SODIUM: 137 mmol/L (ref 135–145)
Total Bilirubin: 2 mg/dL — ABNORMAL HIGH (ref 0.3–1.2)
Total Protein: 7.5 g/dL (ref 6.5–8.1)

## 2017-03-09 LAB — CBC WITH DIFFERENTIAL/PLATELET
BASOS PCT: 0 %
Basophils Absolute: 0.1 10*3/uL (ref 0.0–0.1)
Eosinophils Absolute: 0.1 10*3/uL (ref 0.0–0.7)
Eosinophils Relative: 1 %
HEMATOCRIT: 41.5 % (ref 36.0–46.0)
HEMOGLOBIN: 13.4 g/dL (ref 12.0–15.0)
LYMPHS ABS: 1.6 10*3/uL (ref 0.7–4.0)
Lymphocytes Relative: 14 %
MCH: 30.5 pg (ref 26.0–34.0)
MCHC: 32.3 g/dL (ref 30.0–36.0)
MCV: 94.3 fL (ref 78.0–100.0)
MONOS PCT: 8 %
Monocytes Absolute: 0.9 10*3/uL (ref 0.1–1.0)
NEUTROS ABS: 8.7 10*3/uL — AB (ref 1.7–7.7)
Neutrophils Relative %: 77 %
Platelets: 379 10*3/uL (ref 150–400)
RBC: 4.4 MIL/uL (ref 3.87–5.11)
RDW: 13 % (ref 11.5–15.5)
WBC: 11.2 10*3/uL — ABNORMAL HIGH (ref 4.0–10.5)

## 2017-03-09 LAB — LIPASE, BLOOD: Lipase: 19 U/L (ref 11–51)

## 2017-03-09 MED ORDER — ONDANSETRON 4 MG PO TBDP: 4.0000 mg | ORAL_TABLET | Freq: Three times a day (TID) | ORAL | 1 refills | Status: DC | PRN

## 2017-03-09 MED ORDER — ONDANSETRON HCL 4 MG/2ML IJ SOLN
4.0000 mg | Freq: Once | INTRAMUSCULAR | Status: AC
  Administered 2017-03-09: 4 mg via INTRAVENOUS
  Filled 2017-03-09: qty 2

## 2017-03-09 MED ORDER — SODIUM CHLORIDE 0.9 % IV BOLUS (SEPSIS)
1000.0000 mL | Freq: Once | INTRAVENOUS | Status: AC
  Administered 2017-03-09: 1000 mL via INTRAVENOUS

## 2017-03-09 MED ORDER — SODIUM CHLORIDE 0.9 % IV SOLN
INTRAVENOUS | Status: DC
  Administered 2017-03-09: 21:00:00 via INTRAVENOUS

## 2017-03-09 MED ORDER — SODIUM CHLORIDE 0.9 % IV BOLUS (SEPSIS)
500.0000 mL | Freq: Once | INTRAVENOUS | Status: AC
  Administered 2017-03-09: 1000 mL via INTRAVENOUS

## 2017-03-09 NOTE — Discharge Instructions (Signed)
Important to follow-up with your doctor in the next few days to have the kidney function rechecked.  And to have your liver function tests followed.  Take the Zofran as directed.  Hopefully this will cut her nausea so that you can hydrate yourself better with fluids.  Fluid intake more important than food with bland diet would be fine.

## 2017-03-09 NOTE — ED Provider Notes (Signed)
[facility] EMERGENCY DEPARTMENT Provider Note   CSN: [id number] Arrival date & time: 03/09/17  1857     History   Chief Complaint No chief complaint on file.   HPI [name] [name] is a 52 y.o. female.  Patient with onset of nausea and vomiting Tuesday.  But only vomiting once a day.  Associated with generalized abdominal pain.  Persistent nausea no diarrhea poor p.o. intake.  No fever.  No dysuria.  No history of similar symptoms.  Patient feels dehydrated.  Patient's had a wound to the base of her left foot.  That is being followed by her doctors.  She was treated with Augmentin that was started on January 3 and then recently switched to clindamycin on January 7.  Patient states that that wound is healing okay but it is not healed yet.      History reviewed. No pertinent past medical history.  Patient Active Problem List   Diagnosis Date Noted  . Ulcer of left heel and midfoot with fat layer exposed ([place]) 10/12/2016  . Acute osteomyelitis of toe of right foot ([place]) 08/04/2016  . Acute sinusitis 08/04/2016  . Cellulitis of foot 08/04/2016  . Cervical high risk HPV (human papillomavirus) test positive 08/04/2016  . Charcot's joint of left ankle 08/04/2016  . Conjunctivitis 08/04/2016  . Herniated lumbar intervertebral disc 08/04/2016  . Low back pain 08/04/2016  . Neuropathic pain of both legs 07/29/2016  . MSSA (methicillin susceptible Staphylococcus aureus) 07/21/2016  . 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) 03/15/2016  . Diabetes mellitus type I ([place]) 03/15/2016  . Closed nondisplaced fracture of right clavicle 02/09/2016  . Diabetes mellitus type 2 in nonobese ([place]) 12/24/2015  . Localized edema 12/24/2015  . Microalbuminuria due to type 1 diabetes mellitus ([place]) 08/12/2015  . Bloating 06/18/2015  . Weight gain 06/18/2015  . Bulging lumbar disc 06/17/2015  . Cervical high risk human papillomavirus (HPV) DNA test  positive 06/17/2015  . CKD stage 3 due to type 1 diabetes mellitus ([place]) 06/17/2015  . Hyperlipidemia 06/17/2015  . Hypothyroidism 06/17/2015  . Diabetic neuropathy ([place]) 06/17/2015  . Hypoglycemia due to type 1 diabetes mellitus ([place]) 05/31/2015  . Acquired hypothyroidism 04/08/2015  . Anemia 04/08/2015  . Serum albumin decreased 04/08/2015  . Status post amputation of lesser toe ([place]) 04/08/2015  . Acute osteomyelitis of right foot ([place]) 03/27/2015  . Cellulitis and abscess of foot 03/27/2015  . Essential hypertension 03/27/2015  . Osteomyelitis of ankle or foot 03/27/2015  . Type 1 diabetes mellitus with hyperglycemia ([place]) 03/27/2015  . Charcot's arthropathy 01/08/2015  . Closed avulsion fracture of left calcaneus 01/08/2015    History reviewed. No pertinent surgical history.  OB History    No data available       Home Medications    Prior to Admission medications   Medication Sig Start Date End Date Taking? Authorizing Provider  amoxicillin-clavulanate (AUGMENTIN) 875-125 MG tablet Take 1 tablet by mouth 2 (two) times daily. 08/09/16   Trula Slade, DPM  amoxicillin-clavulanate (AUGMENTIN) 875-125 MG tablet Take 1 tablet by mouth 2 (two) times daily. Take 1 tablet by mouth twice daily for seven days. 08/03/16   Trula Slade, DPM  amoxicillin-clavulanate (AUGMENTIN) 875-125 MG tablet Take 1 tablet by mouth 2 (two) times daily. 03/02/17   Trula Slade, DPM  ciprofloxacin (CIPRO) 500 MG tablet Take 1 tablet (500 mg total) by mouth 2 (two) times daily. 08/09/16   Jacqualyn Posey,  Bonna Gains, DPM  clindamycin (CLEOCIN) 300 MG capsule Take 1 capsule (300 mg total) by mouth 3 (three) times daily. 03/06/17   Trula Slade, DPM  collagenase (SANTYL) ointment Apply 1 application topically daily. 10/25/16   Trula Slade, DPM  collagenase (SANTYL) ointment Apply 1 application topically daily. 11/09/16   Trula Slade, DPM  doxycycline (VIBRA-TABS) 100 MG tablet Take 1  tablet (100 mg total) by mouth 2 (two) times daily. 07/04/16   Trula Slade, DPM  furosemide (LASIX) 20 MG tablet Take 20 mg by mouth. 07/06/15   [provider]  gabapentin (NEURONTIN) 300 MG capsule Take 300 mg by mouth.    [provider]  ibuprofen (ADVIL,MOTRIN) 800 MG tablet  12/31/15   [provider]  Insulin Glargine 300 UNIT/ML SOPN Inject into the skin. 05/29/15   [provider]  insulin lispro (HUMALOG KWIKPEN) 100 UNIT/ML KiwkPen INJECT 0.03-0.1 ML (3-10 UNITS TOTAL) UNDER THE SKIN THREE (3) TIMES A DAY BEFORE MEALS. 12/02/15   [provider]  Insulin Pen Needle (B-D ULTRAFINE III SHORT PEN) 31G X 8 MM MISC  03/31/15   [provider]  Levothyroxine Sodium 88 MCG CAPS Take by mouth.    [provider]  Norethindrone-Ethinyl Estradiol-Fe Biphas (LO LOESTRIN FE) 1 MG-10 MCG / 10 MCG tablet  07/31/15   [provider]  ondansetron (ZOFRAN ODT) 4 MG disintegrating tablet Take 1 tablet (4 mg total) by mouth every 8 (eight) hours as needed. 03/09/17   Fredia Sorrow, MD  oxyCODONE (OXY IR/ROXICODONE) 5 MG immediate release tablet TAKE 1 TABLET EVERY 6 HOURS AS NEEDED FOR PAIN 05/17/16   [provider]  oxyCODONE (OXY IR/ROXICODONE) 5 MG immediate release tablet Take 1 tablet (5 mg total) by mouth every 6 (six) hours as needed for severe pain. 09/08/16   Trula Slade, DPM  promethazine (PHENERGAN) 25 MG tablet Take 25 mg by mouth every 8 (eight) hours as needed for nausea or vomiting (Take 1 tablet by mouth every eight hours as needed for nausea/vomiting). 08/03/16   Trula Slade, DPM  silver nitrate applicators 14-48 % applicator Apply topically 3 (three) times a week. 01/25/17   Trula Slade, DPM  silver sulfADIAZINE (SILVADENE) 1 % cream Apply 1 application topically daily. 11/22/16   Trula Slade, DPM    Family History No family history on file.  Social History Social History   Tobacco  Use  . Smoking status: Unknown If Ever Smoked  . Smokeless tobacco: Never Used  Substance Use Topics  . Alcohol use: Not on file  . Drug use: Not on file     Allergies   Patient has no known allergies.   Review of Systems Review of Systems  Constitutional: Negative for fever.  HENT: Negative for congestion.   Eyes: Negative for redness.  Respiratory: Negative for shortness of breath.   Gastrointestinal: Positive for abdominal pain, nausea and vomiting. Negative for diarrhea.  Genitourinary: Negative for dysuria.  Musculoskeletal: Negative for myalgias.  Skin: Positive for wound.  Neurological: Negative for headaches.  Hematological: Does not bruise/bleed easily.  Psychiatric/Behavioral: Negative for confusion.     Physical Exam Updated Vital Signs BP (!) 142/71   Pulse 99   Temp 98.5 F (36.9 C) (Oral)   Resp 16   Ht 1.676 m (5' 6")   Wt 44.5 kg (98 lb)   SpO2 94%   BMI 15.82 kg/m   Physical Exam  Constitutional: She is  oriented to person, place, and time. She appears well-developed and well-nourished. No distress.  HENT:  Head: Normocephalic and atraumatic.  Mucous membranes very dry.  Eyes: Conjunctivae are normal. Pupils are equal, round, and reactive to light. No scleral icterus.  Neck: Normal range of motion. Neck supple.  Cardiovascular: Normal rate.  Pulmonary/Chest: Effort normal and breath sounds normal. No respiratory distress.  Abdominal: Soft. Bowel sounds are normal. She exhibits no distension. There is no tenderness. There is no guarding.  Musculoskeletal: Normal range of motion. She exhibits no edema.  Wound to the base of the left great toe.  Wound is on the plantar side of the great toe.  Actually more to the tip of the toe no evidence of any necrosis or significant infection.  Wound measures about 2 cm.  Neurological: She is alert and oriented to person, place, and time. No cranial nerve deficit or sensory deficit. She exhibits normal muscle  tone. Coordination normal.  Skin: Skin is warm.  Nursing note and vitals reviewed.    ED Treatments / Results  Labs (all labs ordered are listed, but only abnormal results are displayed) Labs Reviewed  CBC WITH DIFFERENTIAL/PLATELET - Abnormal; Notable for the following components:      Result Value   WBC 11.2 (*)    Neutro Abs 8.7 (*)    All other components within normal limits  COMPREHENSIVE METABOLIC PANEL - Abnormal; Notable for the following components:   Chloride 88 (*)    Glucose, Bld 264 (*)    BUN 25 (*)    Creatinine, Ser 2.07 (*)    Calcium 12.5 (*)    Albumin 3.3 (*)    ALT 10 (*)    Alkaline Phosphatase 133 (*)    Total Bilirubin 2.0 (*)    GFR calc non Af Amer 27 (*)    GFR calc Af Amer 31 (*)    Anion gap 21 (*)    All other components within normal limits  LIPASE, BLOOD    EKG  EKG Interpretation None       Radiology Ct Abdomen Pelvis Wo Contrast  Result Date: 03/09/2017 CLINICAL DATA:  Emesis and abdominal pain for 2 days. EXAM: CT ABDOMEN AND PELVIS WITHOUT CONTRAST TECHNIQUE: Multidetector CT imaging of the abdomen and pelvis was performed following the standard protocol without IV contrast. COMPARISON:  None. FINDINGS: Lower chest: No acute abnormality. Hepatobiliary: No focal liver abnormality is seen. No gallstones, gallbladder wall thickening. Mild prominence of the extrahepatic common bile duct, which measures 6 mm, nonspecific. Pancreas: Few calcifications in the pancreatic tail may represent a sequela of prior pancreatitis, or vascular calcification. Spleen: Normal in size without focal abnormality. Adrenals/Urinary Tract: Adrenal glands are unremarkable. Kidneys are normal, without renal calculi, focal lesion, or hydronephrosis. Bladder is unremarkable. Stomach/Bowel: Stomach is within normal limits. The appendix is not seen, but there is no evidence of appendicitis. No evidence of bowel wall thickening, distention, or inflammatory changes.  Vascular/Lymphatic: Mild aortic atherosclerosis. No enlarged abdominal or pelvic lymph nodes. Reproductive: Uterus and bilateral adnexa are unremarkable. Other: No abdominal wall hernia or abnormality. No abdominopelvic ascites. Musculoskeletal: No acute osseous findings. IMPRESSION: No evidence of acute abnormality within the abdomen or pelvis, accounting for lack of IV contrast. Few calcifications in the pancreatic tail may represent a sequela of prior pancreatitis or vascular calcifications. Mild prominence of the extrahepatic common bile duct, which measures 6 mm, nonspecific. Please correlate clinically. Electronically Signed   By: Linwood Dibbles.D.  On: 03/09/2017 23:45    Procedures Procedures (including critical care time)  Medications Ordered in ED Medications  0.9 %  sodium chloride infusion ( Intravenous New Bag/Given 03/09/17 2053)  ondansetron (ZOFRAN) injection 4 mg (not administered)  sodium chloride 0.9 % bolus 500 mL (not administered)  sodium chloride 0.9 % bolus 1,000 mL (0 mLs Intravenous Stopped 03/09/17 2317)  ondansetron (ZOFRAN) injection 4 mg (4 mg Intravenous Given 03/09/17 2053)     Initial Impression / Assessment and Plan / ED Course  I have reviewed the triage vital signs and the nursing notes.  Pertinent labs & imaging results that were available during my care of the patient were reviewed by me and considered in my medical decision making (see chart for details).     Patient's workup included CT of abdomen.  Abdomen on exam subjective pain.  Nausea and vomiting but not vomiting that frequently usually just once a day which is a bit unusual for like a viral gastritis.  No diarrhea.  Based on this CT scan of the abdomen was ordered and is pending.  Labs do show evidence patient clinically looks very dehydrated.  He may explain the renal failure could be prerenal but patient's big jump is in her creatinine.  Patient received 1 L of fluid here.  Liver function  tests with abnormalities to include an elevation in bilirubin mild elevation and white blood cell count.  Lipase was normal.  Alk phos and as mentioned bilirubin were elevated.'s of some concerns about gallbladder abnormal liver function tests not consistent with hepatitis.  Disposition will be based mainly on CT of the abdomen results.  Patient being treated for a foot wound.  Most concerns for sepsis not hypotensive mild tachycardia.  No fevers.  Patient was originally treated with Augmentin and recently switched to clindamycin.  Only had one day of clindamycin when symptoms started.  Symptoms started on Tuesday.  Either way will need close follow-up with her primary care doctor.  If CT scan of abdomen shows an acute finding may require admission.  Final Clinical Impressions(s) / ED Diagnoses   Final diagnoses:  Generalized abdominal pain  Renal failure, unspecified chronicity  Nausea and vomiting, intractability of vomiting not specified, unspecified vomiting type    ED Discharge Orders        Ordered    ondansetron (ZOFRAN ODT) 4 MG disintegrating tablet  Every 8 hours PRN     03/09/17 2343      Addendum:  Patient CT scan without any significant findings.  Had marginally enlarged common bile duct.  No evidence of any significant gallstone abnormalities.  Based on bilirubin the mL of appropriate for patient to follow with primary care doctor to have these things rechecked.  Persist may require ultrasound.  Patient is not tender in the right upper quadrant.   Patient stable for discharge home.  Patient feeling better after hydration.  Since left great toe wound without evidence of any significant infection.  This is followed by [facility] foot [facility] ankle.    Fredia Sorrow, MD 03/09/17 2352

## 2017-03-09 NOTE — ED Notes (Signed)
ED Provider at bedside. 

## 2017-03-09 NOTE — ED Triage Notes (Signed)
Vomiting yesterday. She is supposed to take antibiotics for left great toe infection. She has been unable to take her medication.

## 2017-03-14 ENCOUNTER — Ambulatory Visit: Payer: BLUE CROSS/BLUE SHIELD | Admitting: Podiatry

## 2017-03-23 ENCOUNTER — Ambulatory Visit (INDEPENDENT_AMBULATORY_CARE_PROVIDER_SITE_OTHER): Payer: BLUE CROSS/BLUE SHIELD

## 2017-03-23 ENCOUNTER — Ambulatory Visit (INDEPENDENT_AMBULATORY_CARE_PROVIDER_SITE_OTHER): Payer: BLUE CROSS/BLUE SHIELD | Admitting: Podiatry

## 2017-03-23 DIAGNOSIS — L97422 Non-pressure chronic ulcer of left heel and midfoot with fat layer exposed: Secondary | ICD-10-CM

## 2017-03-23 DIAGNOSIS — L97501 Non-pressure chronic ulcer of other part of unspecified foot limited to breakdown of skin: Secondary | ICD-10-CM | POA: Diagnosis not present

## 2017-03-23 NOTE — Progress Notes (Signed)
   Subjective: Unknown FoleyKerrin presents the office today for evaluation of left heel and hallux ulceration.  She states that both ulcerations are doing better.  She is been using Betadine to the area daily.  She denies any drainage or pus or any swelling or redness to the foot.  She had a miss her last appointment as she states that she was sick.  She has remained in the surgical shoe.  She has no new concerns but she is happy that the wounds are looking better. Denies any systemic complaints such as fevers, chills, nausea, vomiting. No acute changes since last appointment, and no other complaints at this time.   Objective: AAO x3, NAD DP/PT pulses palpable bilaterally, CRT less than 3 seconds To the left plantar hallux ulceration the wound measures about 1 x 1 cm with a granular wound base.  Overall the wound appears to be much improved.  No surrounding erythema, ascending cellulitis.  There is no fluctuation or crepitation.  There is no malodor.  There appears to be a small superficial abrasion type lesion to the plantar lateral hallux.  This may have been from the bandage.  No signs of infection of this area. Left plantar heel ulceration measuring 0.5 x 0.4 cm and has a depth of 0.2.  There is no probing, undermining or tunneling.  No surrounding erythema, ascending cellulitis.  No fluctuation or crepitation.  There is no malodor. No other open lesions or pre-ulcerative lesions. No pain with calf compression, swelling, warmth, erythema            Assessment: Healing left heel ulceration with cellulitis of the left hallux; superficial abscess  Plan: -All treatment options discussed with the patient including all alternatives, risks, complications. -X-rays were obtained and reviewed.  No definitive evidence of acute cortical destruction suggestive of osteomyelitis and there is no soft tissue emphysema. -Lightly debrided the wound today without any complications or bleeding to healthy, granular  tissue with a 312 blade scalpel after verbal consent was obtained.  Small amount of silver nitrate was applied to the wounds followed by Betadine.  Continue with daily dressing changes as we discussed with Betadine.  There is no clinical signs of infection so we will hold off any further oral antibiotics.  Continue offloading at all times. -Monitor for any clinical signs or symptoms of infection and directed to call the office immediately should any occur or go to the ER. -Follow-up in 2 weeks or sooner if needed.  Call any questions or concerns.  She agrees with this plan.   Vivi BarrackMatthew R Wagoner DPM

## 2017-03-27 ENCOUNTER — Telehealth: Payer: Self-pay | Admitting: *Deleted

## 2017-03-27 NOTE — Telephone Encounter (Signed)
Pt asked if she could do Pilates or the treadmill.

## 2017-03-27 NOTE — Telephone Encounter (Signed)
Left message informing pt not to perform exercises, only ADL to decrease weight bearing, exercises pilates and treadmill may increase the size of the ulcers.

## 2017-04-07 ENCOUNTER — Ambulatory Visit: Payer: BLUE CROSS/BLUE SHIELD | Admitting: Podiatry

## 2017-04-10 ENCOUNTER — Ambulatory Visit: Payer: BLUE CROSS/BLUE SHIELD | Admitting: Podiatry

## 2017-04-20 ENCOUNTER — Encounter: Payer: Self-pay | Admitting: Podiatry

## 2017-04-20 ENCOUNTER — Ambulatory Visit (INDEPENDENT_AMBULATORY_CARE_PROVIDER_SITE_OTHER): Payer: BLUE CROSS/BLUE SHIELD | Admitting: Podiatry

## 2017-04-20 ENCOUNTER — Ambulatory Visit (INDEPENDENT_AMBULATORY_CARE_PROVIDER_SITE_OTHER): Payer: BLUE CROSS/BLUE SHIELD

## 2017-04-20 DIAGNOSIS — S91302A Unspecified open wound, left foot, initial encounter: Secondary | ICD-10-CM

## 2017-04-20 DIAGNOSIS — L97422 Non-pressure chronic ulcer of left heel and midfoot with fat layer exposed: Secondary | ICD-10-CM | POA: Diagnosis not present

## 2017-04-21 ENCOUNTER — Telehealth: Payer: Self-pay | Admitting: *Deleted

## 2017-04-21 ENCOUNTER — Ambulatory Visit: Payer: BLUE CROSS/BLUE SHIELD | Admitting: Podiatry

## 2017-04-21 DIAGNOSIS — M86672 Other chronic osteomyelitis, left ankle and foot: Secondary | ICD-10-CM

## 2017-04-21 LAB — CBC WITH DIFFERENTIAL/PLATELET
BASOS ABS: 91 {cells}/uL (ref 0–200)
Basophils Relative: 0.9 %
Eosinophils Absolute: 182 cells/uL (ref 15–500)
Eosinophils Relative: 1.8 %
HEMATOCRIT: 37.4 % (ref 35.0–45.0)
Hemoglobin: 12.1 g/dL (ref 11.7–15.5)
LYMPHS ABS: 1535 {cells}/uL (ref 850–3900)
MCH: 30 pg (ref 27.0–33.0)
MCHC: 32.4 g/dL (ref 32.0–36.0)
MCV: 92.6 fL (ref 80.0–100.0)
MPV: 11 fL (ref 7.5–12.5)
Monocytes Relative: 8.5 %
NEUTROS PCT: 73.6 %
Neutro Abs: 7434 cells/uL (ref 1500–7800)
PLATELETS: 381 10*3/uL (ref 140–400)
RBC: 4.04 10*6/uL (ref 3.80–5.10)
RDW: 12.2 % (ref 11.0–15.0)
TOTAL LYMPHOCYTE: 15.2 %
WBC: 10.1 10*3/uL (ref 3.8–10.8)
WBCMIX: 859 {cells}/uL (ref 200–950)

## 2017-04-21 LAB — C-REACTIVE PROTEIN: CRP: 2.9 mg/L (ref <8.0)

## 2017-04-21 LAB — HEMOGLOBIN A1C
HEMOGLOBIN A1C: 11.8 %{Hb} — AB (ref <5.7)
Mean Plasma Glucose: 292 (calc)
eAG (mmol/L): 16.2 (calc)

## 2017-04-21 LAB — SEDIMENTATION RATE: Sed Rate: 41 mm/h — ABNORMAL HIGH (ref 0–20)

## 2017-04-21 NOTE — Telephone Encounter (Signed)
I spoke with Tamela OddiBetsy - Dr. Hosie PoissonAlegehan Asres office and fax number is [phone]. Faxed pt's lab results to Dr. Josie SaundersAsres.

## 2017-04-21 NOTE — Telephone Encounter (Signed)
Lets do the foot but see if they can do the entire foot or do the heel and foot. She has a wound on the hallux and the heel.

## 2017-04-21 NOTE — Telephone Encounter (Signed)
-----   Message from Vivi BarrackMatthew R Wagoner, DPM sent at 04/21/2017  9:37 AM EST ----- A1c is very elevated and she needs to get in to see her PCP. This is partially why the wound is not healing. Sed rate also elevated. Please let her know. Can you please order an MRI to rule out osteomyelitis, abscess of the foot? Thanks.

## 2017-04-21 NOTE — Telephone Encounter (Signed)
Left message for pt to call for Dr. Gabriel RungWagoner's review of blood work results and instructions.

## 2017-04-21 NOTE — Telephone Encounter (Signed)
Pt called and I informed of Dr. Gabriel RungWagoner's review of blood work and orders. Pt states [facility]

## 2017-04-23 NOTE — Progress Notes (Signed)
Subjective: [name] [name] presents to the office today for follow-up evaluation of left heel ulceration as well as left big toe ulceration.  She states that heel ulcerations opened back up but was doing very well and almost healed.  She states that 1 of the big toe is doing better.  She denies any drainage or pus.  She is remained in the surgical shoe.  She does change the bandage daily with Betadine.  She denies any increase in swelling redness or red streaks. Denies any systemic complaints such as fevers, chills, nausea, vomiting. No acute changes since last appointment, and no other complaints at this time.   Objective: AAO x3, NAD DP/PT pulses palpable bilaterally, CRT less than 3 seconds Recurrence of ulceration of the plantar heel measuring approximately 2 x 2 cm with a granular wound base.  There is one central area that probes approximately 0.4 cm but there is no probing to bone, undermining or tunneling.  Hyperkeratotic periwound.  No swelling erythema, ascending cellulitis.  No fluctuation or crepitation.  There is no malodor. On the plantar aspect the left hallux there is an ulceration which continues with a granular wound base measuring 1.1 x 1 cm with GYN probing, undermining or tunneling.  There is no significant surrounding erythema, ascending sialitis.  There is no fluctuation or crepitation.  There is no malodor. No open lesions or pre-ulcerative lesions.  No pain with calf compression, swelling, warmth, erythema        Assessment: Recurrence of left heel ulceration of the left plantar hallux ulceration  Plan: -All treatment options discussed with the patient including all alternatives, risks, complications.  -Sharply debrided to healthy, bleeding tissue after verbal consent was obtained the area was cleaned.  A 312 blade scalpel was utilized to debride the wounds.  For now continue Betadine wet-to-dry dressings however I am going to repeat blood work including ESR, CRP, CBC, A1c.   Also x-rays were obtained and reviewed today which not reveal any evidence of acute fracture or stress fracture no definitive evidence of acute osteomyelitis.  Blood work will consider an MRI.  Also discussed possible grafting or melena rule out infection given that the wound has opened back up.  For now continue offloading as well.  Also discussed the wound care evaluation which she did not want to do.  Also discussed total contact cast which she declined. Did not appear to be clinically infected today so held off on oral antibiotics.  -Monitor for any clinical signs or symptoms of infection and directed to call the office immediately should any occur or go to the ER.  Trula Slade DPM

## 2017-05-04 ENCOUNTER — Encounter: Payer: Self-pay | Admitting: Podiatry

## 2017-05-04 ENCOUNTER — Ambulatory Visit: Payer: BLUE CROSS/BLUE SHIELD | Admitting: Podiatry

## 2017-05-04 VITALS — BP 134/78 | HR 98 | Temp 96.5°F | Resp 18

## 2017-05-04 DIAGNOSIS — L02612 Cutaneous abscess of left foot: Secondary | ICD-10-CM | POA: Diagnosis not present

## 2017-05-04 DIAGNOSIS — L03032 Cellulitis of left toe: Secondary | ICD-10-CM | POA: Diagnosis not present

## 2017-05-04 DIAGNOSIS — M869 Osteomyelitis, unspecified: Secondary | ICD-10-CM

## 2017-05-04 MED ORDER — CIPROFLOXACIN HCL 500 MG PO TABS: 500.0000 mg | ORAL_TABLET | Freq: Two times a day (BID) | ORAL | 0 refills | Status: DC

## 2017-05-04 MED ORDER — CLINDAMYCIN HCL 300 MG PO CAPS: 300.0000 mg | ORAL_CAPSULE | Freq: Three times a day (TID) | ORAL | 2 refills | Status: DC

## 2017-05-04 NOTE — Progress Notes (Addendum)
Subjective: Blannie presents the office today for follow-up evaluation of wounds to the left foot.  She states that over the last couple days she has noticed that the toe has been getting worse and the wound of the heel is also getting bigger.  While in the room today she was crying because the wound of the toe is looking a lot worse than what it had been and she just started noticing this over the last couple days but worse today.  She denies any drainage or pus and she denies any redness or warmth but she has noticed the toe is swollen.  She is not on any antibiotics at this time.  She has been on numerous rounds of antibiotics.  She has no other concerns today. Denies any systemic complaints such as fevers, chills, nausea, vomiting. No acute changes since last appointment, and no other complaints at this time.   Objective: AAO x3, NAD DP/PT pulses palpable bilaterally, CRT less than 3 seconds There is edema to the left hallux today and there is ulceration on the plantar aspect.  Overall the wound actually appears to be smaller however the wound is much deeper today and able to probe directly down to bone.  Small amount of purulence expressed on initial debridement of the wound.  I was able to decompress the wound and there was no further purulence identified.  There is no fluctuation or crepitation.  The wound to the plantar aspect of the heel is also larger today measuring approximately 2 x 2 cm with a granular wound base.  I am unable to probe the wound there is no probing to bone, undermining or tunneling.  There is no surrounding erythema, ascending cellulitis to the heel ulceration.  There is no fluctuation or crepitation.  There is no malodor. No open lesions or pre-ulcerative lesions.  No pain with calf compression, swelling, warmth, erythema  Assessment: Osteomyelitis left hallux which is new with worsening ulceration and edema to the hallux; chronic heel ulceration  Plan: -All treatment  options discussed with the patient including all alternatives, risks, complications.  -Patient is very upset today and the wound has worsened quite a bit since I last saw her.  We discussed blood work with her.  Her A1c is very elevated at 11.8 which think is contributing to the nonhealing.  The wounds especially heel wounds are doing much better almost completely closed  at one point.  Also recommended on numerous occasions nonweightbearing but she could not do that she will state that she does not do much walking regularly.  I still think given her neuropathy in her normal walking with activity is too much.  She has declined a total contact cast.  Also she has previously declined wound care center referral.  At this time I discussed with her that we need to consider more advanced options including the wound care center.  She does not want amputation.  Discussed high likelihood of amputation of the toe at least.  She has an MRI scheduled for tomorrow.  -Wound culture taken of the hallux today. -The wound to the hallux was cleaned with saline and iodoform packing was placed.  Continue daily dressing changes this area.  Also Betadine wet-to-dry to the heel.  Overall the foot does not appear to be cellulitic other than the toe.  There is any worsening she is from the hospital. -We will start clindamycin and ciprofloxacin.  We will try to get her in with infectious disease as soon as  possible. -Encouraged follow-up with primary care physician as well in regards to glucose control -Wound care referral placed -Discussed that there is any worsening she is to go immediately to the hospital. -Patient encouraged to call the office with any questions, concerns, change in symptoms.   Vivi BarrackMatthew R Wagoner DPM

## 2017-05-05 ENCOUNTER — Ambulatory Visit
Admission: RE | Admit: 2017-05-05 | Discharge: 2017-05-05 | Disposition: A | Discharge status: Home / Self Care | Payer: BLUE CROSS/BLUE SHIELD | Source: Ambulatory Visit | Attending: Podiatry | Admitting: Podiatry

## 2017-05-05 ENCOUNTER — Telehealth: Payer: Self-pay | Admitting: *Deleted

## 2017-05-05 DIAGNOSIS — M869 Osteomyelitis, unspecified: Secondary | ICD-10-CM

## 2017-05-05 DIAGNOSIS — L03032 Cellulitis of left toe: Principal | ICD-10-CM

## 2017-05-05 DIAGNOSIS — L02612 Cutaneous abscess of left foot: Secondary | ICD-10-CM

## 2017-05-05 DIAGNOSIS — M86672 Other chronic osteomyelitis, left ankle and foot: Secondary | ICD-10-CM

## 2017-05-05 MED ORDER — GADOBENATE DIMEGLUMINE 529 MG/ML IV SOLN
10.0000 mL | Freq: Once | INTRAVENOUS | Status: AC | PRN
  Administered 2017-05-05: 10 mL via INTRAVENOUS

## 2017-05-05 NOTE — Telephone Encounter (Signed)
Faxed clinicals 05/04/2017, and lab 04/21/2017 to Dr. Josie SaundersAsres.

## 2017-05-05 NOTE — Telephone Encounter (Signed)
I spoke with Gigi GinPeggy [facility] she states she has made multiple calls to pt and she is schedule to see Dr. Silvestre MesiBhusal 05/09/2017 and will have the time once pt calls. [facility]

## 2017-05-05 NOTE — Telephone Encounter (Signed)
[facility] states appt 05/18/2017 at 8:00am arrive 7:55am with Dr. Sedonia Smalloberson. Faxed required form, clinicals and demographics to [facility].

## 2017-05-05 NOTE — Telephone Encounter (Signed)
-----   Message from Vivi BarrackMatthew R Wagoner, DPM sent at 05/05/2017 12:58 PM EST ----- Can you please get her an appointment with her infectious disease doctor ASAP and a wound care referral ASAP? Thanks  She has osteomyelitis in both her heel and toe.

## 2017-05-05 NOTE — Telephone Encounter (Signed)
Left message for pt to contact Peggy [facility] to set appt time on 05/09/2017, that Peggy had left multiple messages for pt to call to schedule.

## 2017-05-08 ENCOUNTER — Telehealth: Payer: Self-pay | Admitting: *Deleted

## 2017-05-08 LAB — WOUND CULTURE
MICRO NUMBER: 90294223
SPECIMEN QUALITY:: ADEQUATE

## 2017-05-08 NOTE — Telephone Encounter (Signed)
Yes, please have her keep the ID appointment I will see her later in the week.

## 2017-05-08 NOTE — Telephone Encounter (Signed)
Pt states Infectious Disease doctor can only work her in 05/09/2017 around the same time as Dr. Gabriel RungWagoner's appt tomorrow, so will need to reschedule. I transferred to schedulers for her to be reschedule for later this week.

## 2017-05-09 ENCOUNTER — Telehealth: Payer: Self-pay | Admitting: *Deleted

## 2017-05-09 ENCOUNTER — Ambulatory Visit: Payer: BLUE CROSS/BLUE SHIELD | Admitting: Podiatry

## 2017-05-09 NOTE — Telephone Encounter (Signed)
Faxed copy of 05/04/2017 Wound cultures to Dr. Silvestre MesiBhusal.

## 2017-05-09 NOTE — Telephone Encounter (Signed)
-----   Message from Vivi BarrackMatthew R Wagoner, DPM sent at 05/08/2017  5:25 PM EDT ----- On clinda and cipro Seeing ID 05/09/2017 Val- can you please fax to her ID doctor

## 2017-05-11 ENCOUNTER — Ambulatory Visit: Payer: BLUE CROSS/BLUE SHIELD | Admitting: Podiatry

## 2017-05-11 ENCOUNTER — Ambulatory Visit (INDEPENDENT_AMBULATORY_CARE_PROVIDER_SITE_OTHER): Payer: BLUE CROSS/BLUE SHIELD | Admitting: Podiatry

## 2017-05-11 DIAGNOSIS — M869 Osteomyelitis, unspecified: Secondary | ICD-10-CM

## 2017-05-11 DIAGNOSIS — L02612 Cutaneous abscess of left foot: Secondary | ICD-10-CM

## 2017-05-11 DIAGNOSIS — L03032 Cellulitis of left toe: Secondary | ICD-10-CM | POA: Diagnosis not present

## 2017-05-12 NOTE — Telephone Encounter (Signed)
Received notification Dr. Josie SaundersAsres had not received clinicals. Refaxed clinicals with note on Fax Confirmation that 33 pages were received on 05/05/2017, with apology that they had not been received.

## 2017-05-15 NOTE — Progress Notes (Signed)
Subjective: Unknown FoleyKerrin presents the office today for follow-up evaluation of osteomyelitis to the right foot.  Since I last saw her she has gone to infectious disease.  She has had 2 infusions of IV antibiotics which she cannot recall which one.  Overall she states that she is doing about the same.  She has not has any drainage or pus coming from the wounds.  She is remained in the surgical shoe and offloading.  Overall she feels well and she denies any systemic complaints such as fevers, chills, nausea, vomiting. No acute changes since last appointment, and no other complaints at this time.   Objective: AAO x3, NAD DP/PT pulses palpable bilaterally, CRT less than 3 seconds Ulceration continues to the left hallux which does continue to probe to bone and there is edema to the hallux.  There is no erythema or increase in warmth of the area.  There is no fluctuation or crepitation there is no malodor.  Overall the wound is somewhat smaller but is still probes directly down to bone.  The wound of the plantar heel is about the same size measuring 2 x 2 cm but is more superficial with a granular tissue.  Part of the periphery is hyper granular.  There is no surrounding erythema, ascending cellulitis.  There is no fluctuation or crepitation.  There is no malodor.  There is no probing to bone, undermining or tunneling to this area. No open lesions or pre-ulcerative lesions.  No pain with calf compression, swelling, warmth, erythema  Assessment: Osteomyelitis with chronic ulcerations left foot  Plan: -All treatment options discussed with the patient including all alternatives, risks, complications.  -At this time we had a long discussion again regards to options.  I do recommend amputation of the big toe but she was wanting to hold off on this.  She will likely give the antibiotics a little bit longer to see if they will work.  She like any longer than 2 weeks of antibiotics if she wants to keep this toe although  I think this is unlikely.  As far as the heel ulceration I did clean this today as well and a small amount of silver nitrate was applied to the hyper granular tissue.  There is no probing or any signs of infection of this area and actually the wound appears to be mildly improved.  Medicated new x-ray next appointment to see how the bone is doing.  I also clean the wound of the hallux today and flushed with saline and packed with Iodosorb dressing.  I want her to continue doing the same as well.  *X-ray left foot next appointment  Vivi BarrackMatthew R Ziana Heyliger DPM

## 2017-05-18 ENCOUNTER — Other Ambulatory Visit (HOSPITAL_COMMUNITY): Payer: Self-pay | Admitting: Physician Assistant

## 2017-05-18 ENCOUNTER — Encounter (HOSPITAL_BASED_OUTPATIENT_CLINIC_OR_DEPARTMENT_OTHER): Payer: BLUE CROSS/BLUE SHIELD | Attending: Internal Medicine

## 2017-05-18 DIAGNOSIS — E1040 Type 1 diabetes mellitus with diabetic neuropathy, unspecified: Secondary | ICD-10-CM | POA: Diagnosis not present

## 2017-05-18 DIAGNOSIS — E10610 Type 1 diabetes mellitus with diabetic neuropathic arthropathy: Secondary | ICD-10-CM | POA: Insufficient documentation

## 2017-05-18 DIAGNOSIS — E1069 Type 1 diabetes mellitus with other specified complication: Secondary | ICD-10-CM | POA: Diagnosis not present

## 2017-05-18 DIAGNOSIS — E1022 Type 1 diabetes mellitus with diabetic chronic kidney disease: Secondary | ICD-10-CM | POA: Insufficient documentation

## 2017-05-18 DIAGNOSIS — Z89429 Acquired absence of other toe(s), unspecified side: Secondary | ICD-10-CM | POA: Insufficient documentation

## 2017-05-18 DIAGNOSIS — Z89421 Acquired absence of other right toe(s): Secondary | ICD-10-CM | POA: Diagnosis not present

## 2017-05-18 DIAGNOSIS — Z01810 Encounter for preprocedural cardiovascular examination: Secondary | ICD-10-CM

## 2017-05-18 DIAGNOSIS — L97522 Non-pressure chronic ulcer of other part of left foot with fat layer exposed: Secondary | ICD-10-CM | POA: Diagnosis not present

## 2017-05-18 DIAGNOSIS — E10621 Type 1 diabetes mellitus with foot ulcer: Secondary | ICD-10-CM | POA: Insufficient documentation

## 2017-05-18 DIAGNOSIS — E039 Hypothyroidism, unspecified: Secondary | ICD-10-CM | POA: Insufficient documentation

## 2017-05-18 DIAGNOSIS — I129 Hypertensive chronic kidney disease with stage 1 through stage 4 chronic kidney disease, or unspecified chronic kidney disease: Secondary | ICD-10-CM | POA: Insufficient documentation

## 2017-05-18 DIAGNOSIS — M86672 Other chronic osteomyelitis, left ankle and foot: Secondary | ICD-10-CM | POA: Diagnosis not present

## 2017-05-18 DIAGNOSIS — E1065 Type 1 diabetes mellitus with hyperglycemia: Secondary | ICD-10-CM | POA: Insufficient documentation

## 2017-05-18 DIAGNOSIS — L97422 Non-pressure chronic ulcer of left heel and midfoot with fat layer exposed: Secondary | ICD-10-CM | POA: Insufficient documentation

## 2017-05-18 DIAGNOSIS — N183 Chronic kidney disease, stage 3 (moderate): Secondary | ICD-10-CM | POA: Diagnosis not present

## 2017-05-19 ENCOUNTER — Ambulatory Visit: Payer: BLUE CROSS/BLUE SHIELD | Admitting: Podiatry

## 2017-05-19 ENCOUNTER — Other Ambulatory Visit (HOSPITAL_COMMUNITY): Payer: BLUE CROSS/BLUE SHIELD

## 2017-05-19 ENCOUNTER — Ambulatory Visit (INDEPENDENT_AMBULATORY_CARE_PROVIDER_SITE_OTHER): Payer: BLUE CROSS/BLUE SHIELD

## 2017-05-19 ENCOUNTER — Encounter: Payer: Self-pay | Admitting: Podiatry

## 2017-05-19 DIAGNOSIS — L03032 Cellulitis of left toe: Secondary | ICD-10-CM

## 2017-05-19 DIAGNOSIS — L02612 Cutaneous abscess of left foot: Secondary | ICD-10-CM

## 2017-05-19 DIAGNOSIS — M869 Osteomyelitis, unspecified: Secondary | ICD-10-CM

## 2017-05-21 NOTE — Progress Notes (Signed)
Subjective: Unknown FoleyKerrin presents the office today for follow-up evaluation of osteomyelitis to the right foot.  Since I last saw her she has multiple wound care center yesterday and they are talking about hyperbaric oxygen therapy.  She is also been following up with infectious disease.  She again states that she is very fearful of having amputation.  She states that overall everything is unchanged and she has no new concerns today.  She denies any systemic complaints including fevers chills nausea vomiting or any calf pain chest pain shortness of breath.  Objective: AAO x3, NAD DP/PT pulses palpable bilaterally, CRT less than 3 seconds Overall exam is mostly unchanged.  Ulceration continues to the plantar aspect left hallux which does probe directly down to bone and there is edema to the hallux which continues.  There is no fluctuation or crepitation.  There is no malodor.  On the plantar aspect of the heel continues being ulceration which measures slightly smaller 1.8 x 1.8 cm there is no probing depth, undermining or tunneling.  There is no surrounding erythema, ascending sialitis.  There is no fluctuation or crepitation.  There is no malodor. No open lesions or pre-ulcerative lesions.  No pain with calf compression, swelling, warmth, erythema  Assessment: Osteomyelitis with chronic ulcerations left foot  Plan: -All treatment options discussed with the patient including all alternatives, risks, complications.  -X-rays were obtained and reviewed.  There is osteolysis of the hallux consistent with osteomyelitis which has progressed since last x-ray.  No soft tissue emphysema.  The calcaneus appears to be stable. -We had a frank discussion today regards to her options.  I have recommended an amputation of the toe however she is very hesitant to do this.  We discussed pros and cons of surgery versus limb salvage and I ultimately do recommend amputation but she does not want to proceed with at this point.   Because of this I will have her continue to follow with the wound care center as well as infectious disease.  I will make an appointment to see me back in 2 weeks however she has not been having amputation I want her to continue with the wound care center/infectious disease.  Not convinced that the calcaneus is a true osteomyelitis however given the MRI findings we should treat for it.  Discussed if there is any worsening of infection she is to go immediately to the emergency room.  She is at risk for proximal amputation that we discussed.   Vivi BarrackMatthew R Dicky Boer DPM

## 2017-05-22 ENCOUNTER — Other Ambulatory Visit (HOSPITAL_COMMUNITY): Payer: BLUE CROSS/BLUE SHIELD

## 2017-05-22 ENCOUNTER — Ambulatory Visit (HOSPITAL_COMMUNITY)
Admission: RE | Admit: 2017-05-22 | Discharge: 2017-05-22 | Disposition: A | Discharge status: Home / Self Care | Payer: BLUE CROSS/BLUE SHIELD | Source: Ambulatory Visit | Attending: Physician Assistant | Admitting: Physician Assistant

## 2017-05-22 DIAGNOSIS — Z01818 Encounter for other preprocedural examination: Secondary | ICD-10-CM | POA: Insufficient documentation

## 2017-05-22 DIAGNOSIS — M86672 Other chronic osteomyelitis, left ankle and foot: Secondary | ICD-10-CM | POA: Insufficient documentation

## 2017-05-25 DIAGNOSIS — E10621 Type 1 diabetes mellitus with foot ulcer: Secondary | ICD-10-CM | POA: Diagnosis not present

## 2017-06-02 ENCOUNTER — Telehealth: Payer: Self-pay | Admitting: Podiatry

## 2017-06-02 ENCOUNTER — Encounter (HOSPITAL_BASED_OUTPATIENT_CLINIC_OR_DEPARTMENT_OTHER): Payer: BLUE CROSS/BLUE SHIELD | Attending: Internal Medicine

## 2017-06-02 ENCOUNTER — Ambulatory Visit: Payer: BLUE CROSS/BLUE SHIELD | Admitting: Podiatry

## 2017-06-02 DIAGNOSIS — N183 Chronic kidney disease, stage 3 (moderate): Secondary | ICD-10-CM | POA: Insufficient documentation

## 2017-06-02 DIAGNOSIS — E10621 Type 1 diabetes mellitus with foot ulcer: Secondary | ICD-10-CM | POA: Diagnosis present

## 2017-06-02 DIAGNOSIS — E10610 Type 1 diabetes mellitus with diabetic neuropathic arthropathy: Secondary | ICD-10-CM | POA: Insufficient documentation

## 2017-06-02 DIAGNOSIS — E1040 Type 1 diabetes mellitus with diabetic neuropathy, unspecified: Secondary | ICD-10-CM | POA: Diagnosis not present

## 2017-06-02 DIAGNOSIS — Z89429 Acquired absence of other toe(s), unspecified side: Secondary | ICD-10-CM | POA: Diagnosis not present

## 2017-06-02 DIAGNOSIS — I129 Hypertensive chronic kidney disease with stage 1 through stage 4 chronic kidney disease, or unspecified chronic kidney disease: Secondary | ICD-10-CM | POA: Diagnosis not present

## 2017-06-02 DIAGNOSIS — E1022 Type 1 diabetes mellitus with diabetic chronic kidney disease: Secondary | ICD-10-CM | POA: Insufficient documentation

## 2017-06-02 DIAGNOSIS — L97422 Non-pressure chronic ulcer of left heel and midfoot with fat layer exposed: Secondary | ICD-10-CM | POA: Insufficient documentation

## 2017-06-02 DIAGNOSIS — E1069 Type 1 diabetes mellitus with other specified complication: Secondary | ICD-10-CM | POA: Diagnosis not present

## 2017-06-02 DIAGNOSIS — M86672 Other chronic osteomyelitis, left ankle and foot: Secondary | ICD-10-CM | POA: Insufficient documentation

## 2017-06-02 NOTE — Telephone Encounter (Signed)
I Katharin and left her a voicemail of with what you said Dr. Ardelle AntonWagoner. Told her to call my number [phone] directly and I will get her scheduled next week if she needs to be seen.

## 2017-06-02 NOTE — Telephone Encounter (Signed)
Hi [name] [name]. I was supposed to see Dr. Ardelle AntonWagoner today at 2:45 pm but I had to change it and I wanted to let him know why and what's going on. I'm at the wound care center now for a 1 pm appointment and they decided to move forward with the pressure chamber. I have to go through the orientation today so I won't make the 2:45 appointment. I tried to reschedule for an appointment next week and they didn't have anything so they put me off until the 15 th. I don't know if that's okay with Dr. Ardelle AntonWagoner either. If you could just let me know that you get the message to him and about my next appointment. [phone]. Thank you.

## 2017-06-02 NOTE — Telephone Encounter (Signed)
If she is going to the wound care center and actively being treated then that should be fine. However if she needs to come in to be seen then please get in her next week. Thanks!

## 2017-06-05 DIAGNOSIS — E10621 Type 1 diabetes mellitus with foot ulcer: Secondary | ICD-10-CM | POA: Diagnosis not present

## 2017-06-05 LAB — GLUCOSE, CAPILLARY: Glucose-Capillary: 371 mg/dL — ABNORMAL HIGH (ref 65–99)

## 2017-06-06 DIAGNOSIS — E10621 Type 1 diabetes mellitus with foot ulcer: Secondary | ICD-10-CM | POA: Diagnosis not present

## 2017-06-06 LAB — GLUCOSE, CAPILLARY
GLUCOSE-CAPILLARY: 277 mg/dL — AB (ref 65–99)
GLUCOSE-CAPILLARY: 269 mg/dL — AB (ref 65–99)

## 2017-06-07 DIAGNOSIS — E10621 Type 1 diabetes mellitus with foot ulcer: Secondary | ICD-10-CM | POA: Diagnosis not present

## 2017-06-07 LAB — GLUCOSE, CAPILLARY
GLUCOSE-CAPILLARY: 253 mg/dL — AB (ref 65–99)
Glucose-Capillary: 359 mg/dL — ABNORMAL HIGH (ref 65–99)

## 2017-06-08 DIAGNOSIS — E10621 Type 1 diabetes mellitus with foot ulcer: Secondary | ICD-10-CM | POA: Diagnosis not present

## 2017-06-08 LAB — GLUCOSE, CAPILLARY
GLUCOSE-CAPILLARY: 321 mg/dL — AB (ref 65–99)
Glucose-Capillary: 188 mg/dL — ABNORMAL HIGH (ref 65–99)

## 2017-06-09 DIAGNOSIS — E10621 Type 1 diabetes mellitus with foot ulcer: Secondary | ICD-10-CM | POA: Diagnosis not present

## 2017-06-09 LAB — GLUCOSE, CAPILLARY
GLUCOSE-CAPILLARY: 321 mg/dL — AB (ref 65–99)
GLUCOSE-CAPILLARY: 273 mg/dL — AB (ref 65–99)

## 2017-06-12 ENCOUNTER — Ambulatory Visit: Payer: BLUE CROSS/BLUE SHIELD | Admitting: Podiatry

## 2017-06-12 DIAGNOSIS — E10621 Type 1 diabetes mellitus with foot ulcer: Secondary | ICD-10-CM | POA: Diagnosis not present

## 2017-06-12 LAB — GLUCOSE, CAPILLARY
GLUCOSE-CAPILLARY: 202 mg/dL — AB (ref 65–99)
Glucose-Capillary: 321 mg/dL — ABNORMAL HIGH (ref 65–99)

## 2017-06-13 DIAGNOSIS — E10621 Type 1 diabetes mellitus with foot ulcer: Secondary | ICD-10-CM | POA: Diagnosis not present

## 2017-06-13 LAB — GLUCOSE, CAPILLARY
GLUCOSE-CAPILLARY: 139 mg/dL — AB (ref 65–99)
GLUCOSE-CAPILLARY: 253 mg/dL — AB (ref 65–99)

## 2017-06-14 DIAGNOSIS — E10621 Type 1 diabetes mellitus with foot ulcer: Secondary | ICD-10-CM | POA: Diagnosis not present

## 2017-06-14 LAB — GLUCOSE, CAPILLARY
GLUCOSE-CAPILLARY: 157 mg/dL — AB (ref 65–99)
Glucose-Capillary: 270 mg/dL — ABNORMAL HIGH (ref 65–99)

## 2017-06-15 DIAGNOSIS — E10621 Type 1 diabetes mellitus with foot ulcer: Secondary | ICD-10-CM | POA: Diagnosis not present

## 2017-06-15 LAB — GLUCOSE, CAPILLARY
GLUCOSE-CAPILLARY: 307 mg/dL — AB (ref 65–99)
Glucose-Capillary: 116 mg/dL — ABNORMAL HIGH (ref 65–99)

## 2017-06-16 DIAGNOSIS — E10621 Type 1 diabetes mellitus with foot ulcer: Secondary | ICD-10-CM | POA: Diagnosis not present

## 2017-06-16 LAB — GLUCOSE, CAPILLARY
Glucose-Capillary: 114 mg/dL — ABNORMAL HIGH (ref 65–99)
Glucose-Capillary: 175 mg/dL — ABNORMAL HIGH (ref 65–99)
Glucose-Capillary: 399 mg/dL — ABNORMAL HIGH (ref 65–99)

## 2017-06-19 DIAGNOSIS — E10621 Type 1 diabetes mellitus with foot ulcer: Secondary | ICD-10-CM | POA: Diagnosis not present

## 2017-06-19 LAB — GLUCOSE, CAPILLARY
Glucose-Capillary: 216 mg/dL — ABNORMAL HIGH (ref 65–99)
Glucose-Capillary: 178 mg/dL — ABNORMAL HIGH (ref 65–99)

## 2017-06-20 DIAGNOSIS — E10621 Type 1 diabetes mellitus with foot ulcer: Secondary | ICD-10-CM | POA: Diagnosis not present

## 2017-06-20 LAB — GLUCOSE, CAPILLARY
GLUCOSE-CAPILLARY: 274 mg/dL — AB (ref 65–99)
Glucose-Capillary: 270 mg/dL — ABNORMAL HIGH (ref 65–99)

## 2017-06-21 DIAGNOSIS — E10621 Type 1 diabetes mellitus with foot ulcer: Secondary | ICD-10-CM | POA: Diagnosis not present

## 2017-06-21 DIAGNOSIS — M79676 Pain in unspecified toe(s): Secondary | ICD-10-CM

## 2017-06-21 LAB — GLUCOSE, CAPILLARY
GLUCOSE-CAPILLARY: 151 mg/dL — AB (ref 65–99)
Glucose-Capillary: 189 mg/dL — ABNORMAL HIGH (ref 65–99)

## 2017-06-22 DIAGNOSIS — E10621 Type 1 diabetes mellitus with foot ulcer: Secondary | ICD-10-CM | POA: Diagnosis not present

## 2017-06-22 LAB — GLUCOSE, CAPILLARY
GLUCOSE-CAPILLARY: 123 mg/dL — AB (ref 65–99)
Glucose-Capillary: 121 mg/dL — ABNORMAL HIGH (ref 65–99)
Glucose-Capillary: 200 mg/dL — ABNORMAL HIGH (ref 65–99)
Glucose-Capillary: 391 mg/dL — ABNORMAL HIGH (ref 65–99)

## 2017-06-23 DIAGNOSIS — E10621 Type 1 diabetes mellitus with foot ulcer: Secondary | ICD-10-CM | POA: Diagnosis not present

## 2017-06-23 LAB — GLUCOSE, CAPILLARY
GLUCOSE-CAPILLARY: 387 mg/dL — AB (ref 65–99)
GLUCOSE-CAPILLARY: 181 mg/dL — AB (ref 65–99)

## 2017-06-26 DIAGNOSIS — E10621 Type 1 diabetes mellitus with foot ulcer: Secondary | ICD-10-CM | POA: Diagnosis not present

## 2017-06-26 LAB — GLUCOSE, CAPILLARY
Glucose-Capillary: 154 mg/dL — ABNORMAL HIGH (ref 65–99)
Glucose-Capillary: 234 mg/dL — ABNORMAL HIGH (ref 65–99)

## 2017-06-27 DIAGNOSIS — E10621 Type 1 diabetes mellitus with foot ulcer: Secondary | ICD-10-CM | POA: Diagnosis not present

## 2017-06-27 LAB — GLUCOSE, CAPILLARY
Glucose-Capillary: 168 mg/dL — ABNORMAL HIGH (ref 65–99)
Glucose-Capillary: 329 mg/dL — ABNORMAL HIGH (ref 65–99)

## 2017-06-28 ENCOUNTER — Encounter (HOSPITAL_BASED_OUTPATIENT_CLINIC_OR_DEPARTMENT_OTHER): Payer: BLUE CROSS/BLUE SHIELD | Attending: Physician Assistant

## 2017-06-28 DIAGNOSIS — E10610 Type 1 diabetes mellitus with diabetic neuropathic arthropathy: Secondary | ICD-10-CM | POA: Insufficient documentation

## 2017-06-28 DIAGNOSIS — E10621 Type 1 diabetes mellitus with foot ulcer: Secondary | ICD-10-CM | POA: Insufficient documentation

## 2017-06-28 DIAGNOSIS — I129 Hypertensive chronic kidney disease with stage 1 through stage 4 chronic kidney disease, or unspecified chronic kidney disease: Secondary | ICD-10-CM | POA: Diagnosis not present

## 2017-06-28 DIAGNOSIS — L97429 Non-pressure chronic ulcer of left heel and midfoot with unspecified severity: Secondary | ICD-10-CM | POA: Diagnosis not present

## 2017-06-28 DIAGNOSIS — Z89429 Acquired absence of other toe(s), unspecified side: Secondary | ICD-10-CM | POA: Diagnosis not present

## 2017-06-28 DIAGNOSIS — E1022 Type 1 diabetes mellitus with diabetic chronic kidney disease: Secondary | ICD-10-CM | POA: Insufficient documentation

## 2017-06-28 DIAGNOSIS — N183 Chronic kidney disease, stage 3 (moderate): Secondary | ICD-10-CM | POA: Insufficient documentation

## 2017-06-28 LAB — GLUCOSE, CAPILLARY
Glucose-Capillary: 202 mg/dL — ABNORMAL HIGH (ref 65–99)
Glucose-Capillary: 281 mg/dL — ABNORMAL HIGH (ref 65–99)

## 2017-06-29 DIAGNOSIS — E10621 Type 1 diabetes mellitus with foot ulcer: Secondary | ICD-10-CM | POA: Diagnosis not present

## 2017-06-29 LAB — GLUCOSE, CAPILLARY
GLUCOSE-CAPILLARY: 212 mg/dL — AB (ref 65–99)
Glucose-Capillary: 353 mg/dL — ABNORMAL HIGH (ref 65–99)

## 2017-06-30 DIAGNOSIS — E10621 Type 1 diabetes mellitus with foot ulcer: Secondary | ICD-10-CM | POA: Diagnosis not present

## 2017-06-30 LAB — GLUCOSE, CAPILLARY: GLUCOSE-CAPILLARY: 171 mg/dL — AB (ref 65–99)

## 2017-07-03 DIAGNOSIS — E10621 Type 1 diabetes mellitus with foot ulcer: Secondary | ICD-10-CM | POA: Diagnosis not present

## 2017-07-03 LAB — GLUCOSE, CAPILLARY
GLUCOSE-CAPILLARY: 250 mg/dL — AB (ref 65–99)
GLUCOSE-CAPILLARY: 287 mg/dL — AB (ref 65–99)

## 2017-07-04 DIAGNOSIS — E10621 Type 1 diabetes mellitus with foot ulcer: Secondary | ICD-10-CM | POA: Diagnosis not present

## 2017-07-04 LAB — GLUCOSE, CAPILLARY
GLUCOSE-CAPILLARY: 292 mg/dL — AB (ref 65–99)
GLUCOSE-CAPILLARY: 277 mg/dL — AB (ref 65–99)

## 2017-07-05 DIAGNOSIS — E10621 Type 1 diabetes mellitus with foot ulcer: Secondary | ICD-10-CM | POA: Diagnosis not present

## 2017-07-05 LAB — GLUCOSE, CAPILLARY
GLUCOSE-CAPILLARY: 190 mg/dL — AB (ref 65–99)
Glucose-Capillary: 142 mg/dL — ABNORMAL HIGH (ref 65–99)

## 2017-07-06 DIAGNOSIS — E10621 Type 1 diabetes mellitus with foot ulcer: Secondary | ICD-10-CM | POA: Diagnosis not present

## 2017-07-06 LAB — GLUCOSE, CAPILLARY
GLUCOSE-CAPILLARY: 205 mg/dL — AB (ref 65–99)
GLUCOSE-CAPILLARY: 303 mg/dL — AB (ref 65–99)

## 2017-07-07 DIAGNOSIS — E10621 Type 1 diabetes mellitus with foot ulcer: Secondary | ICD-10-CM | POA: Diagnosis not present

## 2017-07-10 DIAGNOSIS — E10621 Type 1 diabetes mellitus with foot ulcer: Secondary | ICD-10-CM | POA: Diagnosis not present

## 2017-07-10 LAB — GLUCOSE, CAPILLARY
GLUCOSE-CAPILLARY: 141 mg/dL — AB (ref 65–99)
GLUCOSE-CAPILLARY: 96 mg/dL (ref 65–99)

## 2017-07-17 DIAGNOSIS — E10621 Type 1 diabetes mellitus with foot ulcer: Secondary | ICD-10-CM | POA: Diagnosis not present

## 2017-07-17 LAB — GLUCOSE, CAPILLARY
Glucose-Capillary: 187 mg/dL — ABNORMAL HIGH (ref 65–99)
Glucose-Capillary: 156 mg/dL — ABNORMAL HIGH (ref 65–99)

## 2017-07-18 DIAGNOSIS — E10621 Type 1 diabetes mellitus with foot ulcer: Secondary | ICD-10-CM | POA: Diagnosis not present

## 2017-07-18 LAB — GLUCOSE, CAPILLARY
GLUCOSE-CAPILLARY: 156 mg/dL — AB (ref 65–99)
Glucose-Capillary: 246 mg/dL — ABNORMAL HIGH (ref 65–99)

## 2017-07-20 DIAGNOSIS — E10621 Type 1 diabetes mellitus with foot ulcer: Secondary | ICD-10-CM | POA: Diagnosis not present

## 2017-07-20 LAB — GLUCOSE, CAPILLARY
GLUCOSE-CAPILLARY: 286 mg/dL — AB (ref 65–99)
Glucose-Capillary: 121 mg/dL — ABNORMAL HIGH (ref 65–99)

## 2017-07-21 DIAGNOSIS — E10621 Type 1 diabetes mellitus with foot ulcer: Secondary | ICD-10-CM | POA: Diagnosis not present

## 2017-07-21 LAB — GLUCOSE, CAPILLARY
GLUCOSE-CAPILLARY: 241 mg/dL — AB (ref 65–99)
GLUCOSE-CAPILLARY: 180 mg/dL — AB (ref 65–99)

## 2017-07-25 DIAGNOSIS — E10621 Type 1 diabetes mellitus with foot ulcer: Secondary | ICD-10-CM | POA: Diagnosis not present

## 2017-07-31 ENCOUNTER — Encounter (HOSPITAL_BASED_OUTPATIENT_CLINIC_OR_DEPARTMENT_OTHER): Payer: BLUE CROSS/BLUE SHIELD | Attending: Internal Medicine

## 2017-07-31 DIAGNOSIS — I129 Hypertensive chronic kidney disease with stage 1 through stage 4 chronic kidney disease, or unspecified chronic kidney disease: Secondary | ICD-10-CM | POA: Insufficient documentation

## 2017-07-31 DIAGNOSIS — N183 Chronic kidney disease, stage 3 (moderate): Secondary | ICD-10-CM | POA: Insufficient documentation

## 2017-07-31 DIAGNOSIS — E1022 Type 1 diabetes mellitus with diabetic chronic kidney disease: Secondary | ICD-10-CM | POA: Insufficient documentation

## 2017-07-31 DIAGNOSIS — E10610 Type 1 diabetes mellitus with diabetic neuropathic arthropathy: Secondary | ICD-10-CM | POA: Insufficient documentation

## 2017-07-31 DIAGNOSIS — Z89421 Acquired absence of other right toe(s): Secondary | ICD-10-CM | POA: Diagnosis not present

## 2017-07-31 DIAGNOSIS — E1040 Type 1 diabetes mellitus with diabetic neuropathy, unspecified: Secondary | ICD-10-CM | POA: Insufficient documentation

## 2017-07-31 DIAGNOSIS — E10621 Type 1 diabetes mellitus with foot ulcer: Secondary | ICD-10-CM | POA: Insufficient documentation

## 2017-07-31 DIAGNOSIS — L97422 Non-pressure chronic ulcer of left heel and midfoot with fat layer exposed: Secondary | ICD-10-CM | POA: Diagnosis not present

## 2017-08-14 DIAGNOSIS — E10621 Type 1 diabetes mellitus with foot ulcer: Secondary | ICD-10-CM | POA: Diagnosis not present

## 2017-08-21 DIAGNOSIS — E10621 Type 1 diabetes mellitus with foot ulcer: Secondary | ICD-10-CM | POA: Diagnosis not present

## 2017-08-28 ENCOUNTER — Encounter (HOSPITAL_BASED_OUTPATIENT_CLINIC_OR_DEPARTMENT_OTHER): Payer: BLUE CROSS/BLUE SHIELD | Attending: Internal Medicine

## 2017-08-28 DIAGNOSIS — E1122 Type 2 diabetes mellitus with diabetic chronic kidney disease: Secondary | ICD-10-CM | POA: Insufficient documentation

## 2017-08-28 DIAGNOSIS — E1140 Type 2 diabetes mellitus with diabetic neuropathy, unspecified: Secondary | ICD-10-CM | POA: Diagnosis not present

## 2017-08-28 DIAGNOSIS — L97422 Non-pressure chronic ulcer of left heel and midfoot with fat layer exposed: Secondary | ICD-10-CM | POA: Diagnosis not present

## 2017-08-28 DIAGNOSIS — I129 Hypertensive chronic kidney disease with stage 1 through stage 4 chronic kidney disease, or unspecified chronic kidney disease: Secondary | ICD-10-CM | POA: Insufficient documentation

## 2017-08-28 DIAGNOSIS — E11621 Type 2 diabetes mellitus with foot ulcer: Secondary | ICD-10-CM | POA: Diagnosis not present

## 2017-08-28 DIAGNOSIS — N183 Chronic kidney disease, stage 3 (moderate): Secondary | ICD-10-CM | POA: Diagnosis not present

## 2017-08-28 DIAGNOSIS — E11610 Type 2 diabetes mellitus with diabetic neuropathic arthropathy: Secondary | ICD-10-CM | POA: Diagnosis not present

## 2017-08-28 DIAGNOSIS — Z89421 Acquired absence of other right toe(s): Secondary | ICD-10-CM | POA: Insufficient documentation

## 2017-09-11 DIAGNOSIS — E11621 Type 2 diabetes mellitus with foot ulcer: Secondary | ICD-10-CM | POA: Diagnosis not present

## 2017-09-19 DIAGNOSIS — E11621 Type 2 diabetes mellitus with foot ulcer: Secondary | ICD-10-CM | POA: Diagnosis not present

## 2017-09-22 DIAGNOSIS — E11621 Type 2 diabetes mellitus with foot ulcer: Secondary | ICD-10-CM | POA: Diagnosis not present

## 2017-09-28 ENCOUNTER — Encounter (HOSPITAL_BASED_OUTPATIENT_CLINIC_OR_DEPARTMENT_OTHER): Payer: BLUE CROSS/BLUE SHIELD | Attending: Internal Medicine

## 2017-09-28 DIAGNOSIS — N183 Chronic kidney disease, stage 3 (moderate): Secondary | ICD-10-CM | POA: Insufficient documentation

## 2017-09-28 DIAGNOSIS — E10610 Type 1 diabetes mellitus with diabetic neuropathic arthropathy: Secondary | ICD-10-CM | POA: Insufficient documentation

## 2017-09-28 DIAGNOSIS — E10621 Type 1 diabetes mellitus with foot ulcer: Secondary | ICD-10-CM | POA: Insufficient documentation

## 2017-09-28 DIAGNOSIS — I129 Hypertensive chronic kidney disease with stage 1 through stage 4 chronic kidney disease, or unspecified chronic kidney disease: Secondary | ICD-10-CM | POA: Diagnosis not present

## 2017-09-28 DIAGNOSIS — E1040 Type 1 diabetes mellitus with diabetic neuropathy, unspecified: Secondary | ICD-10-CM | POA: Insufficient documentation

## 2017-09-28 DIAGNOSIS — Z794 Long term (current) use of insulin: Secondary | ICD-10-CM | POA: Diagnosis not present

## 2017-09-28 DIAGNOSIS — E1022 Type 1 diabetes mellitus with diabetic chronic kidney disease: Secondary | ICD-10-CM | POA: Insufficient documentation

## 2017-09-28 DIAGNOSIS — L97422 Non-pressure chronic ulcer of left heel and midfoot with fat layer exposed: Secondary | ICD-10-CM | POA: Insufficient documentation

## 2017-10-06 DIAGNOSIS — E10621 Type 1 diabetes mellitus with foot ulcer: Secondary | ICD-10-CM | POA: Diagnosis not present

## 2017-10-12 DIAGNOSIS — E10621 Type 1 diabetes mellitus with foot ulcer: Secondary | ICD-10-CM | POA: Diagnosis not present

## 2017-12-21 ENCOUNTER — Ambulatory Visit: Payer: BLUE CROSS/BLUE SHIELD | Admitting: Podiatry

## 2017-12-25 ENCOUNTER — Ambulatory Visit: Payer: BLUE CROSS/BLUE SHIELD | Admitting: Podiatry

## 2017-12-25 ENCOUNTER — Encounter: Payer: Self-pay | Admitting: Podiatry

## 2017-12-25 ENCOUNTER — Ambulatory Visit (INDEPENDENT_AMBULATORY_CARE_PROVIDER_SITE_OTHER): Payer: BLUE CROSS/BLUE SHIELD

## 2017-12-25 VITALS — Temp 95.1°F

## 2017-12-25 DIAGNOSIS — L97422 Non-pressure chronic ulcer of left heel and midfoot with fat layer exposed: Secondary | ICD-10-CM | POA: Diagnosis not present

## 2017-12-26 ENCOUNTER — Other Ambulatory Visit: Payer: Self-pay | Admitting: Podiatry

## 2017-12-26 ENCOUNTER — Telehealth: Payer: Self-pay | Admitting: Podiatry

## 2017-12-26 MED ORDER — MEDIHONEY WOUND/BURN DRESSING EX GEL: CUTANEOUS | 5 refills | Status: DC

## 2017-12-26 MED ORDER — DOXYCYCLINE HYCLATE 100 MG PO TABS: 100.0000 mg | ORAL_TABLET | Freq: Two times a day (BID) | ORAL | 0 refills | Status: DC

## 2017-12-26 NOTE — Telephone Encounter (Signed)
I sent doxycycline to her pharmacy. She also left her medihoney here and did not bring it with her.

## 2017-12-26 NOTE — Telephone Encounter (Signed)
Left message informing pt the antibiotic had been sent to the [facility] and that she had left the Medihoney and I would order Medihoney as well.

## 2017-12-26 NOTE — Telephone Encounter (Signed)
Pt came in for a visit yesterday and was supposed to have a prescription sent over to her pharmacy. Pharmacy has not received the order. Can you call the patient?  [facility] [address]

## 2017-12-31 NOTE — Progress Notes (Signed)
Subjective: 52 year old female presents the office today for recurrence of the wound to the bottom of her left heel.  Since I last saw her she was seen by the wound care center and she had hyperbaric oxygen therapy which did heal the wound on the hallux and she was in a offloading cast and shoe this is returning back to regular shoe she has noticed this area opened back up over the last 2 weeks.  Larger notes that she came in.  She has not noticed an odor.  She is not on any antibiotics. Denies any systemic complaints such as fevers, chills, nausea, vomiting. No acute changes since last appointment, and no other complaints at this time.   Objective: AAO x3, NAD-presents today wearing a regular shoe DP/PT pulses palpable bilaterally, CRT less than 3 seconds Ulceration present to the plantar aspect of the left heel.  See picture below.  Wound base is granular and there is macerated, hyperkeratotic tissue on the periphery.  Is no drainage or pus there is no surrounding erythema, ascending cellulitis.  There is no fluctuation or crepitation or any malodor.  Ulceration of the hallux is healed.  No other open lesions or pre-ulcerative lesions.  No pain with calf compression, swelling, warmth, erythema      Assessment: Recurrent left heel wound  Plan: -All treatment options discussed with the patient including all alternatives, risks, complications.  -X-rays were obtained and reviewed.  No definitive evidence of acute osteomyelitis but there is prominence of the bone plantarly which is new likely from healed infection. -I sharply debrided the wound without any complications or bleeding to remove all nonviable tissue and to debride the wound to healthy, granular wound base utilizing tissue nipper as well as a 312 blade scalpel.  I encouraged her nonweightbearing for offloading shoe at all times and she has an offloading shoe that she got wound care center and I want her to go back into this. Medihoney was  applied today.  Pulmonary continue daily dressing changes.  We will start doxycycline. -Monitor for any clinical signs or symptoms of infection and directed to call the office immediately should any occur or go to the ER.  Follow-up in 10 days or sooner if needed.  [name] DPM

## 2018-01-02 ENCOUNTER — Ambulatory Visit (INDEPENDENT_AMBULATORY_CARE_PROVIDER_SITE_OTHER): Payer: BLUE CROSS/BLUE SHIELD | Admitting: Podiatry

## 2018-01-02 ENCOUNTER — Encounter: Payer: Self-pay | Admitting: Podiatry

## 2018-01-02 VITALS — Temp 96.2°F

## 2018-01-02 DIAGNOSIS — L89622 Pressure ulcer of left heel, stage 2: Secondary | ICD-10-CM

## 2018-01-02 DIAGNOSIS — E1149 Type 2 diabetes mellitus with other diabetic neurological complication: Secondary | ICD-10-CM

## 2018-01-02 DIAGNOSIS — L97422 Non-pressure chronic ulcer of left heel and midfoot with fat layer exposed: Secondary | ICD-10-CM

## 2018-01-02 NOTE — Progress Notes (Signed)
Subjective: 52 year old female presents the office today for follow-up evaluation of a wound to the bottom of the left heel.  She states that the area is getting better she is been using the meta honey on the area daily.  Denies any purulence but she gets some occasional drainage coming from the area.  No surrounding redness or swelling she is recalling overall she states she is doing well.  She is returned into wearing the offloading heel shoe. Denies any systemic complaints such as fevers, chills, nausea, vomiting. No acute changes since last appointment, and no other complaints at this time.  She is remained on antibiotics.  Allergy well.  Objective: AAO x3, NAD DP/PT pulses palpable bilaterally, CRT less than 3 seconds Ulceration present to the left plantar heel measuring 1.2 x 1.2 x 0.2 cm with a granular wound base.  Hyperkeratotic periwound.  Some fibrotic tissue overlying the wound prior to debridement.  There is no probing to bone, undermining or tunneling and overall the wound appears to be filling in.  No other lesions are identified. No open lesions or pre-ulcerative lesions.  No pain with calf compression, swelling, warmth, erythema  Assessment: Left heel ulceration  Plan: -All treatment options discussed with the patient including all alternatives, risks, complications.  -Today I sharply debrided the wound to remove all nonviable and hyperkeratotic tissue.  Debrided the wound utilizing a tissue nipper as well as a 312 blade scalpel down to healthy, granular tissue. -Continue offloading at all times.  Finish course of antibiotic's.  Continue with meta honey dressing changes daily.  We will see her back in the next 10 to 14 days at that point I will see her back for the wound also I want her to see Raiford Noble for measurement for orthotics. -Patient encouraged to call the office with any questions, concerns, change in symptoms.   [name] [name] DPM

## 2018-01-12 ENCOUNTER — Ambulatory Visit: Payer: BLUE CROSS/BLUE SHIELD | Admitting: Podiatry

## 2018-01-15 ENCOUNTER — Ambulatory Visit: Payer: BLUE CROSS/BLUE SHIELD | Admitting: Podiatry

## 2018-01-15 ENCOUNTER — Ambulatory Visit (INDEPENDENT_AMBULATORY_CARE_PROVIDER_SITE_OTHER): Payer: BLUE CROSS/BLUE SHIELD | Admitting: Orthotics

## 2018-01-15 ENCOUNTER — Telehealth: Payer: Self-pay | Admitting: Podiatry

## 2018-01-15 ENCOUNTER — Encounter: Payer: Self-pay | Admitting: Podiatry

## 2018-01-15 DIAGNOSIS — E1149 Type 2 diabetes mellitus with other diabetic neurological complication: Secondary | ICD-10-CM | POA: Diagnosis not present

## 2018-01-15 DIAGNOSIS — L97422 Non-pressure chronic ulcer of left heel and midfoot with fat layer exposed: Secondary | ICD-10-CM | POA: Diagnosis not present

## 2018-01-15 DIAGNOSIS — L89622 Pressure ulcer of left heel, stage 2: Secondary | ICD-10-CM

## 2018-01-15 NOTE — Telephone Encounter (Signed)
Pt was seen today and was to call back to check on supplies. She wanted to let the Dr. Ardelle AntonWagoner know that the pads she has are Col Active+, patient wants to know if these okay to use?  Please give her a call

## 2018-01-15 NOTE — Progress Notes (Signed)
Scanned for LW accommodative device with deep heel offload to address DM ulcter LEFT. Plan on Richy to fab.

## 2018-01-15 NOTE — Telephone Encounter (Signed)
I informed pt of Dr. Gabriel RungWagoner's okay to use the ColActive +.

## 2018-01-15 NOTE — Telephone Encounter (Signed)
Yes, that is OK for her to use.

## 2018-01-17 NOTE — Progress Notes (Signed)
Subjective: 52 year old female presents the office today for follow-up evaluation of a wound to the bottom of the left heel.  Overall she states that she is doing about the same.  Denies any drainage or pus.  She is continue with daily dressing changes.  She has been wearing her surgical shoe that the wound care center gave her previously. No acute changes since last appointment, and no other complaints at this time.  She is remained on antibiotics.  Allergy well.  Objective: AAO x3, NAD DP/PT pulses palpable bilaterally, CRT less than 3 seconds Ulceration present to the left plantar heel measuring 1.2 x 1.2 x 0.2 cm with a granular wound base.  Overall has the same size.  Hyperkeratotic periwound.  Wound is more granular today and there is no significant fibrotic tissue.  There is no probing to bone, undermining or tunneling and overall the wound appears to be filling in.  No other lesions are identified. No open lesions or pre-ulcerative lesions.  No pain with calf compression, swelling, warmth, erythema  Assessment: Left heel ulceration  Plan: -All treatment options discussed with the patient including all alternatives, risks, complications.  -Today I sharply debrided the wound to remove all nonviable and hyperkeratotic tissue.  Debrided the wound utilizing a tissue nipper as well as a 312 blade scalpel down to healthy, granular tissue. -Continue offloading at all times.  I dispensed a surgical shoe with offloading pads and also needed offloading pad to put her on the wound as well to help take for the pressure off the area.  Consider comfortable contact cast.  She also saw a Rick to get measured for inserts to help offload the wound. -She has ColActive plus at home and we will do this daily.  -Monitor for any clinical signs or symptoms of infection and directed to call the office immediately should any occur or go to the ER.  Vivi BarrackMatthew R Wagoner DPM

## 2018-01-22 ENCOUNTER — Ambulatory Visit (INDEPENDENT_AMBULATORY_CARE_PROVIDER_SITE_OTHER): Payer: BLUE CROSS/BLUE SHIELD | Admitting: Podiatry

## 2018-01-22 DIAGNOSIS — L97422 Non-pressure chronic ulcer of left heel and midfoot with fat layer exposed: Secondary | ICD-10-CM

## 2018-01-22 DIAGNOSIS — E1149 Type 2 diabetes mellitus with other diabetic neurological complication: Secondary | ICD-10-CM

## 2018-01-29 NOTE — Progress Notes (Signed)
Subjective: 52 year old female presents the office today for follow-up evaluation of a wound to the bottom of the left heel.  Overall she states that the wound is about the same size.  She denies any drainage or pus coming from the wound she denies any increase in swelling or redness.  She has been keeping  ColActive plus dressing changes on it and she is been changing the bandage daily.  She is wearing the surgical shoe with offloading pad, Peg Assist.  No acute changes since last appointment, and no other complaints at this time.  She is remained on antibiotics.  Allergy well.  Objective: AAO x3, NAD DP/PT pulses palpable bilaterally, CRT less than 3 seconds Ulceration present to the left plantar heel measuring 1.2 x 1.2 x 0.2 cm with a granular wound base with hyperkeratotic periwound..  When asked about the same size.  Is no probing to bone, undermining or tunneling.  It appears that the periphery of the wound starts to be more superficial but the deeper area is on the central aspect.  There is no drainage or pus identified today there is no fluctuation or crepitation or any malodor. No pain with calf compression, swelling, warmth, erythema  Assessment: Left heel ulceration  Plan: -All treatment options discussed with the patient including all alternatives, risks, complications.  -Today I sharply debrided the wound to remove all nonviable and hyperkeratotic tissue.  Debrided the wound utilizing a tissue nipper as well as a 312 blade scalpel down to healthy, granular tissue. -Continue offloading at all times.  We discussed cast and nonweightbearing options to be going on vacation and has not to this with her being out of the state.  Today working to continue with the surgical shoe with the PegAssist I also modified this to further offload the heel as I could. -Continue daily dressing changes -Monitor for any clinical signs or symptoms of infection and directed to call the office immediately  should any occur or go to the ER.  Vivi BarrackMatthew R Zell Doucette DPM

## 2018-02-01 ENCOUNTER — Ambulatory Visit: Payer: BLUE CROSS/BLUE SHIELD | Admitting: Podiatry

## 2018-02-05 ENCOUNTER — Encounter: Payer: BLUE CROSS/BLUE SHIELD | Admitting: Orthotics

## 2018-02-05 ENCOUNTER — Ambulatory Visit: Payer: BLUE CROSS/BLUE SHIELD | Admitting: Podiatry

## 2018-02-05 ENCOUNTER — Encounter

## 2018-02-09 ENCOUNTER — Ambulatory Visit (INDEPENDENT_AMBULATORY_CARE_PROVIDER_SITE_OTHER): Payer: BLUE CROSS/BLUE SHIELD | Admitting: Podiatry

## 2018-02-09 ENCOUNTER — Encounter: Payer: Self-pay | Admitting: Podiatry

## 2018-02-09 DIAGNOSIS — E1149 Type 2 diabetes mellitus with other diabetic neurological complication: Secondary | ICD-10-CM

## 2018-02-09 DIAGNOSIS — L97422 Non-pressure chronic ulcer of left heel and midfoot with fat layer exposed: Secondary | ICD-10-CM | POA: Diagnosis not present

## 2018-02-09 NOTE — Patient Instructions (Signed)

## 2018-02-14 NOTE — Progress Notes (Signed)
Subjective: 52 year old female presents the office today for follow-up evaluation of a wound to the bottom of the left heel.  Overall she states that is doing about the same.  She does report mild increase in drainage coming from the area but overall still doing the same about the same in size although it may be filling in some.  Denies any increase in redness or drainage or any swelling.  No pain.  No other concerns.  She is continue with the same dressing.  No fevers, chills, nausea, vomiting.  No calf pain, chest pain, shortness of breath.  Objective: AAO x3, NAD DP/PT pulses palpable bilaterally, CRT less than 3 seconds Ulceration present to the left plantar heel measuring 1.2 x 1.2 x 0.2 cm with a granular wound base with hyperkeratotic periwound.  Overall the wound is the same size.  There is mild increase in some clear drainage coming from the area but there is no pus.  No surrounding erythema, ascending cellulitis.  No fluctuation or crepitation or any malodor.  No pain with calf compression, swelling, warmth, erythema  Assessment: Left heel ulceration  Plan: -All treatment options discussed with the patient including all alternatives, risks, complications.  -Today I sharply debrided the wound to remove all nonviable and hyperkeratotic tissue.  Debrided the wound utilizing a tissue nipper as well as a 312 blade scalpel down to healthy, granular tissue. -Discussed total contact cast.  Due to the increase in drainage rate hold off on this and likely do this next week.  I did not dispense her orthotics today. -Monitor for any clinical signs or symptoms of infection and directed to call the office immediately should any occur or go to the ER.  Return in about 1 week (around 02/16/2018).  Vivi BarrackMatthew R Wagoner DPM

## 2018-02-16 ENCOUNTER — Ambulatory Visit (INDEPENDENT_AMBULATORY_CARE_PROVIDER_SITE_OTHER): Payer: BLUE CROSS/BLUE SHIELD | Admitting: Podiatry

## 2018-02-16 ENCOUNTER — Encounter: Payer: Self-pay | Admitting: Podiatry

## 2018-02-16 VITALS — Temp 96.8°F

## 2018-02-16 DIAGNOSIS — L97422 Non-pressure chronic ulcer of left heel and midfoot with fat layer exposed: Secondary | ICD-10-CM

## 2018-02-16 DIAGNOSIS — E1149 Type 2 diabetes mellitus with other diabetic neurological complication: Secondary | ICD-10-CM

## 2018-02-18 NOTE — Progress Notes (Signed)
Subjective: 52 year old female presents the office today for follow-up evaluation of a wound to the bottom of the left heel.  She presents today for further evaluation and possible total contact cast.  She actually states the wound is doing a lot better and is much smaller she denies any drainage or pus.  She is been wearing a surgical shoe with the offloading pads as well.  No increase in swelling or redness to her foot and overall she is happy with the progress she is made since I last saw her.  She denies any fevers, chills, nausea, vomiting.  Denies any calf pain, chest pain, shortness of breath.  She has no new concerns otherwise.  No fevers, chills, nausea, vomiting.  No calf pain, chest pain, shortness of breath.  Objective: AAO x3, NAD DP/PT pulses palpable bilaterally, CRT less than 3 seconds Ulceration present to the left plantar heel measuring 0.8 x 0.7 x 0.1 cm with a granular wound base with hyperkeratotic periwound.  Overall the wound is doing much better.  There is no drainage or pus and there is no surrounding erythema, ascending cellulitis.  There is no fluctuation or crepitation malodor. No pain with calf compression, swelling, warmth, erythema  Assessment: Left heel ulceration- with improvement   Plan: -All treatment options discussed with the patient including all alternatives, risks, complications.  -Today I sharply debrided the wound to remove all nonviable and hyperkeratotic tissue.  Debrided the wound utilizing a tissue nipper as well as a 312 blade scalpel down to healthy, granular tissue. -Continue with Prisma dressing changes daily. -As the wound is doing much better hold off on total contact cast today however if the wound stalls out or gets larger we will do this and she is in agreement. -Monitor for any clinical signs or symptoms of infection and directed to call the office immediately should any occur or go to the ER.  Vivi BarrackMatthew R Wagoner DPM

## 2018-02-22 ENCOUNTER — Ambulatory Visit: Payer: BLUE CROSS/BLUE SHIELD | Admitting: Podiatry

## 2018-03-05 ENCOUNTER — Ambulatory Visit: Payer: BLUE CROSS/BLUE SHIELD | Admitting: Podiatry

## 2018-03-05 DIAGNOSIS — L97422 Non-pressure chronic ulcer of left heel and midfoot with fat layer exposed: Secondary | ICD-10-CM

## 2018-03-05 DIAGNOSIS — E1149 Type 2 diabetes mellitus with other diabetic neurological complication: Secondary | ICD-10-CM

## 2018-03-13 NOTE — Progress Notes (Signed)
Subjective: 53 year old female presents the office today for follow-up evaluation of a wound to the bottom of the left heel.  She states that overall she is doing better.  She has been doing the Prisma dressing changes every other day and she is been continuing the offloading boot.  She denies any drainage or pus or any swelling or redness.  She has no other concerns today. She denies any fevers, chills, nausea, vomiting.  Denies any calf pain, chest pain, shortness of breath.   Objective: AAO x3, NAD DP/PT pulses palpable bilaterally, CRT less than 3 seconds Ulceration present to the left plantar heel measuring 0.6 x 0.5 x 0.1 cm with a granular wound base with hyperkeratotic periwound.  Overall the wound is doing much better and is been filling in.  There is no drainage or pus and there is no surrounding erythema, ascending cellulitis.  There is no fluctuation or crepitation malodor. No pain with calf compression, swelling, warmth, erythema  Assessment: Left heel ulceration- with improvement   Plan: -All treatment options discussed with the patient including all alternatives, risks, complications.  -Today I sharply debrided the wound to remove all nonviable and hyperkeratotic tissue.  Debrided the wound utilizing a tissue nipper as well as a 312 blade scalpel down to healthy, granular tissue. -Continue with Prisma dressing changes daily. -Monitor for any clinical signs or symptoms of infection and directed to call the office immediately should any occur or go to the ER.  [name] DPM

## 2018-03-15 ENCOUNTER — Encounter: Payer: Self-pay | Admitting: Podiatry

## 2018-03-15 ENCOUNTER — Ambulatory Visit: Payer: BLUE CROSS/BLUE SHIELD | Admitting: Podiatry

## 2018-03-15 VITALS — Temp 97.5°F

## 2018-03-15 DIAGNOSIS — L97422 Non-pressure chronic ulcer of left heel and midfoot with fat layer exposed: Secondary | ICD-10-CM | POA: Diagnosis not present

## 2018-03-15 DIAGNOSIS — E1149 Type 2 diabetes mellitus with other diabetic neurological complication: Secondary | ICD-10-CM

## 2018-03-18 NOTE — Progress Notes (Signed)
Subjective: 53 year old female presents the office today for follow-up evaluation of a wound to the bottom of the left heel.  Overall she states that she is doing better.  She has been continuing with the Actisorb dressing daily and she thinks it is getting much better.  She denies any drainage or pus coming from the area and she denies any surrounding redness or red streaks.  She has no pain.  She is continue with the surgical shoe with offloading. She has no other concerns today. She denies any fevers, chills, nausea, vomiting.  Denies any calf pain, chest pain, shortness of breath.   Objective: AAO x3, NAD DP/PT pulses palpable bilaterally, CRT less than 3 seconds Ulceration present to the left plantar heel measuring 0.5 x 0.4 and appears to be filling in a more superficial.  Minimal hyperkeratotic.  Wound and overall is improved as well..  There is no drainage or pus and there is no surrounding erythema, ascending cellulitis.  There is no fluctuation or crepitation malodor. No pain with calf compression, swelling, warmth, erythema  Assessment: Left heel ulceration- improving   Plan: -All treatment options discussed with the patient including all alternatives, risks, complications.  -Today I sharply debrided the wound to remove all nonviable and hyperkeratotic tissue.  Debrided the wound utilizing a tissue nipper as well as a 312 blade scalpel down to healthy, granular tissue. -Continue with Actisorb dressing changes daily. -Monitor for any clinical signs or symptoms of infection and directed to call the office immediately should any occur or go to the ER.  [name] DPM

## 2018-03-26 ENCOUNTER — Ambulatory Visit: Payer: BLUE CROSS/BLUE SHIELD | Admitting: Podiatry

## 2018-03-26 ENCOUNTER — Encounter: Payer: Self-pay | Admitting: Podiatry

## 2018-03-26 VITALS — BP 140/87 | HR 82 | Temp 97.4°F | Resp 16

## 2018-03-26 DIAGNOSIS — L97422 Non-pressure chronic ulcer of left heel and midfoot with fat layer exposed: Secondary | ICD-10-CM

## 2018-03-26 DIAGNOSIS — E1149 Type 2 diabetes mellitus with other diabetic neurological complication: Secondary | ICD-10-CM

## 2018-03-26 MED ORDER — CEPHALEXIN 500 MG PO CAPS: 500.0000 mg | ORAL_CAPSULE | Freq: Three times a day (TID) | ORAL | 0 refills | Status: DC

## 2018-03-26 NOTE — Progress Notes (Signed)
Subjective: 53 year old female presents the office today for follow-up evaluation of a wound to the bottom of the left heel.  She states that she has been on her feet a lot over the last couple of days and she feels the she developed a lot of dead skin that has been trimmed.  She denies any drainage or pus or increasing swelling or redness.  She has continued daily dressing changes and she is been continuing the offloading shoe.  She has no other concerns today.  Denies any fevers, chills, nausea, vomiting.  No calf pain, chest pain, shortness of breath. She has no other concerns today. She denies any fevers, chills, nausea, vomiting.  Denies any calf pain, chest pain, shortness of breath.   Objective: AAO x3, NAD DP/PT pulses palpable bilaterally, CRT less than 3 seconds Ulceration present to the plantar aspect of the left heel and there is an old blister formation on the area.  After debrided the area today it was larger measuring 1.5 x 1.5 cm with a granular base after debridement however the wound is superficial and there is no probing, undermining or tunneling.  There is no surrounding erythema, ascending cellulitis. Minimal edema.  No fluctuation crepitation.  No ascending cellulitis.  No malodor.  No oth open sores orer p 5.   No pain with calf compression, swelling, warmth, erythema  Assessment: Left heel ulceration-worsening  Plan: -All treatment options discussed with the patient including all alternatives, risks, complications.  -Today I sharply debrided the wound to remove all nonviable and hyperkeratotic tissue.  Debrided the wound utilizing a tissue nipper as well as a 312 blade scalpel down to healthy, granular tissue.  We will switch to Medihoney dressing changes daily.  -Keflex -Continue offloading at all times.  We will do a total contact cast today but I want her to change the bandage given the worsening of the wound to make sure of infection.  Wound continues likely consider a  total contact cast next appointment. -Monitor for any clinical signs or symptoms of infection and directed to call the office immediately should any occur or go to the ER.  *x-ray next appointment  [name] DPM

## 2018-04-02 ENCOUNTER — Ambulatory Visit (INDEPENDENT_AMBULATORY_CARE_PROVIDER_SITE_OTHER): Payer: BLUE CROSS/BLUE SHIELD | Admitting: Podiatry

## 2018-04-02 ENCOUNTER — Ambulatory Visit (INDEPENDENT_AMBULATORY_CARE_PROVIDER_SITE_OTHER): Payer: BLUE CROSS/BLUE SHIELD

## 2018-04-02 DIAGNOSIS — L97422 Non-pressure chronic ulcer of left heel and midfoot with fat layer exposed: Secondary | ICD-10-CM

## 2018-04-02 DIAGNOSIS — E0843 Diabetes mellitus due to underlying condition with diabetic autonomic (poly)neuropathy: Secondary | ICD-10-CM

## 2018-04-05 NOTE — Progress Notes (Signed)
   Subjective:  53 year old female presenting today for follow up evaluation of an ulceration of the left heel. She states the wound has improved significantly since her last visit. She denies modifying factors. She has been using MediHoney daily as well as taking Keflex three times daily as directed. Patient is here for further evaluation and treatment.   No past medical history on file.    Objective/Physical Exam General: The patient is alert and oriented x3 in no acute distress.  Dermatology:  Wound #1 noted to the left heel measuring 2.0 x 1.5 x 0.2 cm (LxWxD).   To the noted ulceration(s), there is no eschar. There is a moderate amount of slough, fibrin, and necrotic tissue noted. Granulation tissue and wound base is red. There is a minimal amount of serosanguineous drainage noted. There is no exposed bone muscle-tendon ligament or joint. There is no malodor. Periwound integrity is intact. Skin is warm, dry and supple bilateral lower extremities.  Vascular: Palpable pedal pulses bilaterally. No edema or erythema noted. Capillary refill within normal limits.  Neurological: Epicritic and protective threshold diminished bilaterally.   Musculoskeletal Exam: Range of motion within normal limits to all pedal and ankle joints bilateral. Muscle strength 5/5 in all groups bilateral.   Radiographic Exam:  Normal osseous mineralization. Joint spaces preserved. No fracture/dislocation/boney destruction.    Assessment: #1 ulceration of the left heel secondary to diabetes mellitus #2 diabetes mellitus w/ peripheral neuropathy   Plan of Care:  #1 Patient was evaluated. X-Rays reviewed.  #2 medically necessary excisional debridement including subcutaneous tissue was performed using a tissue nipper and a chisel blade. Excisional debridement of all the necrotic nonviable tissue down to healthy bleeding viable tissue was performed with post-debridement measurements same as pre-. #3 the wound was  cleansed and dry sterile dressing applied. #4 Continue using Medi Honey daily with a dry sterile dressing.  #5 Continue taking oral Keflex three times daily as directed.  #6 Continue weightbearing in post op shoe.  #7 patient is to return to clinic in 2 weeks with Dr. Ardelle Anton.   Felecia Shelling, DPM [facility]  Dr. Felecia Shelling, DPM    [address]                                        [place], [place] [address]                Office [phone]  Fax [phone]

## 2018-04-17 ENCOUNTER — Ambulatory Visit: Payer: BLUE CROSS/BLUE SHIELD | Admitting: Podiatry

## 2018-04-17 ENCOUNTER — Encounter: Payer: Self-pay | Admitting: Podiatry

## 2018-04-17 ENCOUNTER — Encounter: Payer: BLUE CROSS/BLUE SHIELD | Admitting: Orthotics

## 2018-04-17 VITALS — BP 139/86 | HR 84 | Temp 97.7°F | Resp 14

## 2018-04-17 DIAGNOSIS — E0843 Diabetes mellitus due to underlying condition with diabetic autonomic (poly)neuropathy: Secondary | ICD-10-CM | POA: Diagnosis not present

## 2018-04-17 DIAGNOSIS — L97422 Non-pressure chronic ulcer of left heel and midfoot with fat layer exposed: Secondary | ICD-10-CM

## 2018-04-18 NOTE — Progress Notes (Signed)
Subjective: [name] [name] presents the office today for follow-up evaluation of a wound on the bottom of her left heel.  She states that overall she thinks the wound is getting better.  Still some mild drainage coming to the area but denies any pus.  She denies any surrounding redness or red streaks and she is having no pain.  She is continue with the Darco shoe with the offloading pads.  She states that she is hesitant in going back into a total contact cast that she has done this previously when the wound was present prior. Denies any systemic complaints such as fevers, chills, nausea, vomiting. No acute changes since last appointment, and no other complaints at this time.   Objective: AAO x3, NAD DP/PT pulses palpable bilaterally, CRT less than 3 seconds Ulceration continues on the plantar aspect the left heel.  Today the wound is smaller and after I debrided the wound down to healthy, granular tissue the wound measures 1.7 x 0.8 x 0.2 cm.  There is no probing, undermining or tunneling.  No surrounding erythema, ascending cellulitis.  There is no fluctuation or crepitation malodor.  No open lesions or pre-ulcerative lesions.  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  -Today I sharply debrided the wound without any complications utilizing a tissue nipper as well as a #312 blade scalpel at debrided the wound down to healthy, granular tissue.  As the wound is healing we will continue with similar dressings with the Medihoney.  I discussed total contact cast today but she wants to hold off on this as the wound is healing.  Discussed pros and cons of this.  She has wedge shoe at home to help offload the heel and I want her to use that in combination with the Pegassit in order to help facilitate further  offloading. -Monitor for any clinical signs or symptoms of infection and directed to call the office immediately should any occur or go to the ER. -Patient encouraged to call the office with any questions, concerns, change in symptoms.   [name] DPM

## 2018-04-27 ENCOUNTER — Ambulatory Visit: Payer: BLUE CROSS/BLUE SHIELD | Admitting: Podiatry

## 2018-05-01 ENCOUNTER — Encounter: Payer: Self-pay | Admitting: Podiatry

## 2018-05-01 ENCOUNTER — Ambulatory Visit: Payer: BLUE CROSS/BLUE SHIELD | Admitting: Podiatry

## 2018-05-01 VITALS — BP 133/88 | HR 83 | Temp 97.2°F | Resp 14

## 2018-05-01 DIAGNOSIS — L97422 Non-pressure chronic ulcer of left heel and midfoot with fat layer exposed: Secondary | ICD-10-CM

## 2018-05-01 DIAGNOSIS — E0843 Diabetes mellitus due to underlying condition with diabetic autonomic (poly)neuropathy: Secondary | ICD-10-CM | POA: Diagnosis not present

## 2018-05-07 NOTE — Progress Notes (Signed)
Subjective: [name] [name] presents the office today for follow-up evaluation of a wound on the bottom of her left heel.  She states that overall she feels the wound is getting somewhat better although slowly.  She does admit that she is been on her foot a lot of walking recently.  She denies any drainage or pus coming from the area she denies any swelling or redness.  She has no other concerns today. Denies any systemic complaints such as fevers, chills, nausea, vomiting. No acute changes since last appointment, and no other complaints at this time.   Objective: AAO x3, NAD DP/PT pulses palpable bilaterally, CRT less than 3 seconds Ulceration continues on the plantar aspect the left heel.  Today after debrided the wound it measures 1.4 x 0.6 x 0.2 cm.  There is no probing, undermining or tunneling.  No surrounding erythema, ascending cellulitis.  There is no fluctuation or crepitation malodor.  No open lesions or pre-ulcerative lesions.  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  -Today I sharply debrided the wound without any complications utilizing a tissue nipper as well as a #312 blade scalpel at debrided the wound down to healthy, granular tissue.  We will continue daily dressing changes as this is been helpful.  We discussed total contact cast again today but she wants to hold off on this since the wound is healing.  Encouraged elevation, proper foot as much as possible as well as using offloading pads.  Continue offloading shoe as well. -Monitor for any clinical signs or symptoms of infection and directed to call the office immediately should any occur or go to the ER.  [name] [name] DPM

## 2018-05-15 ENCOUNTER — Ambulatory Visit: Payer: BLUE CROSS/BLUE SHIELD | Admitting: Podiatry

## 2018-05-15 ENCOUNTER — Encounter: Payer: Self-pay | Admitting: Podiatry

## 2018-05-15 ENCOUNTER — Other Ambulatory Visit: Payer: Self-pay

## 2018-05-15 VITALS — BP 139/88 | HR 83 | Temp 96.8°F | Resp 14

## 2018-05-15 DIAGNOSIS — L97422 Non-pressure chronic ulcer of left heel and midfoot with fat layer exposed: Secondary | ICD-10-CM | POA: Diagnosis not present

## 2018-05-15 DIAGNOSIS — E0843 Diabetes mellitus due to underlying condition with diabetic autonomic (poly)neuropathy: Secondary | ICD-10-CM | POA: Diagnosis not present

## 2018-05-21 NOTE — Progress Notes (Signed)
Subjective: [name] [name] presents the office today for follow-up evaluation of a wound on the bottom of her left heel.  She feels that the wound is doing much better.  She states that is almost closed.  Denies any drainage or pus from swelling or redness.  She has been changing the bandage daily with  Medihoney. Denies any systemic complaints such as fevers, chills, nausea, vomiting. No acute changes since last appointment, and no other complaints at this time.   Objective: AAO x3, NAD DP/PT pulses palpable bilaterally, CRT less than 3 seconds Ulceration continues on the plantar aspect the left heel.  Today after debrided the wound it measures 0.5 x 0.4 x 0.1 cm.  Overall appears much more superficial.  Mild hyperkeratotic periwound.  There is no probing, undermining or tunneling.  No surrounding erythema, ascending cellulitis.  There is no fluctuation or crepitation malodor.  No open lesions or pre-ulcerative lesions.  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  -Today I sharply debrided the wound without any complications utilizing a tissue nipper as well as a #312 blade scalpel at debrided the wound down to healthy, granular tissue.  We will continue with daily dressing changes with medihoney.  I do want her to remain in the Darco shoe with the offloading pads at this time.  However I did dispense orthotics I want her to gradually start to wear these and start 15 minutes a day and make sure that the wound does not getting any larger causing any issues.  Return in about 10 days (around 05/25/2018).  [name] DPM

## 2018-05-25 ENCOUNTER — Ambulatory Visit: Payer: BLUE CROSS/BLUE SHIELD | Admitting: Podiatry

## 2018-05-28 ENCOUNTER — Encounter: Payer: Self-pay | Admitting: Podiatry

## 2018-05-28 ENCOUNTER — Other Ambulatory Visit: Payer: Self-pay

## 2018-05-28 ENCOUNTER — Ambulatory Visit (INDEPENDENT_AMBULATORY_CARE_PROVIDER_SITE_OTHER): Payer: BLUE CROSS/BLUE SHIELD | Admitting: Podiatry

## 2018-05-28 ENCOUNTER — Ambulatory Visit: Payer: BLUE CROSS/BLUE SHIELD | Admitting: Orthotics

## 2018-05-28 VITALS — Temp 98.0°F

## 2018-05-28 DIAGNOSIS — E0843 Diabetes mellitus due to underlying condition with diabetic autonomic (poly)neuropathy: Secondary | ICD-10-CM

## 2018-05-28 DIAGNOSIS — L97422 Non-pressure chronic ulcer of left heel and midfoot with fat layer exposed: Secondary | ICD-10-CM

## 2018-05-28 DIAGNOSIS — E1149 Type 2 diabetes mellitus with other diabetic neurological complication: Secondary | ICD-10-CM

## 2018-05-28 DIAGNOSIS — L89622 Pressure ulcer of left heel, stage 2: Secondary | ICD-10-CM

## 2018-05-28 NOTE — Progress Notes (Signed)
Added further offloading in the heel to address diabetic ulcer.

## 2018-05-29 NOTE — Progress Notes (Signed)
Subjective: [name] [name] presents the office today for follow-up evaluation of a wound on the bottom of her left heel.  Since I last saw her she has been continue with daily dressing changes.  She has been keeping medihoney on the wound daily.  She did try the inserts she felt that after trying insert but made the wound worse so she stopped wearing a return to the surgical shoe.  She suggested that she was gradually increase the time that she was wearing it.  She did have some increasing drainage which was clear but no pus.  She states that the wound is starting to heal again once going back in the surgical shoe.  No pain.  No increase in swelling or redness. Denies any systemic complaints such as fevers, chills, nausea, vomiting. No acute changes since last appointment, and no other complaints at this time.   Objective: AAO x3, NAD DP/PT pulses palpable bilaterally, CRT less than 3 seconds Ulceration continues on the plantar aspect the left heel.  Today after debrided the wound it measures 0.5 x 0.4 x 0.1 cm.  This is same size as last appointment.  She does relate that the wound got worse and started to improve again.  Mild hyperkeratotic periwound.  There is no probing, undermining or tunneling.  No surrounding erythema, ascending cellulitis.  There is no fluctuation or crepitation malodor.  No open lesions or pre-ulcerative lesions.  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  -Today I sharply debrided the wound without any complications utilizing a tissue nipper as well as a #312 blade scalpel at debrided the wound down to healthy, granular tissue.  We will continue with daily dressing changes with iodosorb.  Remain inside the surgical shoe with offloading.  Although Raiford Noble did modify the inserts they want to  switch to heal more before going back into a shoe. -Monitor for any clinical signs or symptoms of infection and directed to call the office immediately should any occur or go to the ER.  Return in about 2 weeks (around 06/11/2018).  [name] DPM

## 2018-06-11 ENCOUNTER — Ambulatory Visit: Payer: BLUE CROSS/BLUE SHIELD | Admitting: Podiatry

## 2018-06-12 ENCOUNTER — Ambulatory Visit: Payer: BLUE CROSS/BLUE SHIELD | Admitting: Podiatry

## 2018-06-12 ENCOUNTER — Encounter: Payer: Self-pay | Admitting: Podiatry

## 2018-06-12 ENCOUNTER — Other Ambulatory Visit: Payer: Self-pay

## 2018-06-12 VITALS — Temp 97.5°F

## 2018-06-12 DIAGNOSIS — E0843 Diabetes mellitus due to underlying condition with diabetic autonomic (poly)neuropathy: Secondary | ICD-10-CM

## 2018-06-12 DIAGNOSIS — L97422 Non-pressure chronic ulcer of left heel and midfoot with fat layer exposed: Secondary | ICD-10-CM | POA: Diagnosis not present

## 2018-06-18 NOTE — Progress Notes (Signed)
Subjective: [name] [name] presents the office today for follow-up evaluation of a wound on the bottom of her left heel.  This is getting better.  She has had no drainage or pus coming from the area.  She alternates between using the iodosorb and medihoney.  She has tried the orthotics for short amount of time at home.  She does feel that the wound is getting better.  She denies any swelling or redness or any drainage or pus coming from the wound. Denies any systemic complaints such as fevers, chills, nausea, vomiting. No acute changes since last appointment, and no other complaints at this time.   Objective: AAO x3, NAD DP/PT pulses palpable bilaterally, CRT less than 3 seconds Ulceration continues on the plantar aspect the left heel.  Today after debrided the wound it measures 0.3 x 0.2 x 0.1 cm after debridement.  Mild hyperkeratotic periwound.  There is no probing, undermining or tunneling.  No surrounding erythema, ascending cellulitis.  There is no fluctuation or crepitation malodor.  No open lesions or pre-ulcerative lesions.  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  -Today I sharply debrided the wound without any complications utilizing a tissue nipper as well as a #312 blade scalpel at debrided the wound down to healthy, granular tissue.  We will continue with daily dressing changes with iodosorb.  Remain inside the surgical shoe with offloading. -Once the wound heals then we will try the orthotics for offloading -Monitor for any clinical signs or symptoms of infection and directed to call the office immediately should any occur or go to the ER.  Return in about 2 weeks (around 06/26/2018).  [name] DPM

## 2018-06-26 ENCOUNTER — Ambulatory Visit: Payer: BLUE CROSS/BLUE SHIELD | Admitting: Podiatry

## 2018-06-26 ENCOUNTER — Encounter: Payer: Self-pay | Admitting: Podiatry

## 2018-06-26 ENCOUNTER — Other Ambulatory Visit: Payer: Self-pay

## 2018-06-26 VITALS — Temp 97.9°F

## 2018-06-26 DIAGNOSIS — L03116 Cellulitis of left lower limb: Secondary | ICD-10-CM | POA: Diagnosis not present

## 2018-06-26 DIAGNOSIS — E0843 Diabetes mellitus due to underlying condition with diabetic autonomic (poly)neuropathy: Secondary | ICD-10-CM

## 2018-06-26 DIAGNOSIS — L97422 Non-pressure chronic ulcer of left heel and midfoot with fat layer exposed: Secondary | ICD-10-CM

## 2018-06-26 MED ORDER — SULFAMETHOXAZOLE-TRIMETHOPRIM 800-160 MG PO TABS: 1.0000 | ORAL_TABLET | Freq: Two times a day (BID) | ORAL | 0 refills | Status: DC

## 2018-06-28 NOTE — Progress Notes (Signed)
Subjective: Chemise presents the office today for follow-up evaluation of a wound on the bottom of her left heel.  She thinks that the wound itself is getting better however she is noticed a blister just adjacent to the area.  She is not sure which caused this.  She has been continuing surgical shoe and she has not tried the orthotics again.  Denies any drainage or pus or any red streaks.  Minimal redness to the area at times. Denies any systemic complaints such as fevers, chills, nausea, vomiting. No acute changes since last appointment, and no other complaints at this time.   Objective: AAO x3, NAD DP/PT pulses palpable bilaterally, CRT less than 3 seconds Ulceration continues on the plantar aspect the left heel.  Overall the wound itself is doing better measure 0.2 x 0.2 x 0.1 cm however just lateral to this area as a blister.  It does appear that the blister has already drained and there is no fluid present in it today.  I did try to make sure there is no further fluid which I did not identify any today.  Faint rim of erythema but there is no ascending cellulitis.  Minimal edema but there is no fluctuation or crepitation or any malodor. No open lesions or pre-ulcerative lesions.  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  -Today I sharply debrided the wound without any complications utilizing a tissue nipper as well as a #312 blade scalpel at debrided the wound down to healthy, granular tissue.  We are any switch to using Betadine wet-to-dry overlying the wound as well as the blistered area daily. -Continue offloading at all times with surgical shoe -Keflex -Monitor for any clinical signs or symptoms of infection and directed to call the office immediately should any occur or go to the ER.  [name] DPM

## 2018-07-05 ENCOUNTER — Other Ambulatory Visit: Payer: Self-pay

## 2018-07-05 ENCOUNTER — Encounter: Payer: Self-pay | Admitting: Podiatry

## 2018-07-05 ENCOUNTER — Ambulatory Visit: Payer: BLUE CROSS/BLUE SHIELD | Admitting: Podiatry

## 2018-07-05 VITALS — BP 121/76 | HR 84 | Temp 97.5°F | Resp 14

## 2018-07-05 DIAGNOSIS — L97422 Non-pressure chronic ulcer of left heel and midfoot with fat layer exposed: Secondary | ICD-10-CM | POA: Diagnosis not present

## 2018-07-05 DIAGNOSIS — E0843 Diabetes mellitus due to underlying condition with diabetic autonomic (poly)neuropathy: Secondary | ICD-10-CM

## 2018-07-08 NOTE — Progress Notes (Signed)
Subjective: Savhanna presents the office today for follow-up evaluation of a wound on the bottom of her left heel.  She states the wound is doing better and she feels it is almost healed.  She states that swelling and she was having on the area that she got intermittently has improved as well.  Denies any redness or drainage she denies any red streaks.  No pus.  She is still remained in the offloading shoe.  No other concerns today.  Denies any systemic complaints such as fevers, chills, nausea, vomiting. No acute changes since last appointment, and no other complaints at this time.   Objective: AAO x3, NAD DP/PT pulses palpable bilaterally, CRT less than 3 seconds On the plantar aspect of the heel is a hyperkeratotic lesion.  Upon debridement appears that the wound is almost completely healed.  The blister is resolved.  Overall the wound is doing substantially better.  There is no edema, erythema, drainage or pus there is no clinical signs of infection.  No pain.  No other lesions are identified. No open lesions or pre-ulcerative lesions.  No pain with calf compression, swelling, warmth, erythema  Assessment: Chronic ulceration left plantar heel with much improvement.  Plan: -All treatment options discussed with the patient including all alternatives, risks, complications.  -Today I sharply debrided the wound without any complications utilizing a tissue nipper as well as a #312 blade scalpel at debrided the wound down to healthy, granular tissue. The wound has almost completely healed. There is a single area that is very superficial but almost healed. No signs of infection -Continue with offloading at all times.  -Monitor for any clinical signs or symptoms of infection and directed to call the office immediately should any occur or go to the ER.  [name] DPM

## 2018-07-19 ENCOUNTER — Other Ambulatory Visit: Payer: Self-pay

## 2018-07-19 ENCOUNTER — Ambulatory Visit (INDEPENDENT_AMBULATORY_CARE_PROVIDER_SITE_OTHER): Payer: BLUE CROSS/BLUE SHIELD | Admitting: Podiatry

## 2018-07-19 ENCOUNTER — Encounter: Payer: Self-pay | Admitting: Podiatry

## 2018-07-19 VITALS — Temp 97.5°F

## 2018-07-19 DIAGNOSIS — L97422 Non-pressure chronic ulcer of left heel and midfoot with fat layer exposed: Secondary | ICD-10-CM | POA: Diagnosis not present

## 2018-07-19 DIAGNOSIS — E0843 Diabetes mellitus due to underlying condition with diabetic autonomic (poly)neuropathy: Secondary | ICD-10-CM | POA: Diagnosis not present

## 2018-07-27 NOTE — Progress Notes (Signed)
Subjective: [name] [name] presents the office today for follow-up evaluation of a wound on the bottom of her left heel.  Overall she states that she is doing well and she feels that the wound is healed.  She denies any drainage or pus or any swelling or any redness to the foot.  She still wearing the offloading [employer] shoe but she does state that she has been trying the orthotics and getting used to them more. Denies any systemic complaints such as fevers, chills, nausea, vomiting. No acute changes since last appointment, and no other complaints at this time.   Objective: AAO x3, NAD DP/PT pulses palpable bilaterally, CRT less than 3 seconds On the plantar aspect of the heel is a hyperkeratotic lesion.  Upon debridement appears that the wound has healed although the central aspect appears to be pre-ulcerative.  There is no surrounding erythema, ascending cellulitis.  No fluctuation crepitation any malodor identified today.  There is no other open lesions identified today.  No open lesions or pre-ulcerative lesions.  No pain with calf compression, swelling, warmth, erythema  Assessment: Chronic ulceration left plantar heel with much improvement.  Plan: -All treatment options discussed with the patient including all alternatives, risks, complications.  -I sharply debrided the hyperkeratotic tissue without any complications utilizing a #312 blade scalpel without any complications or bleeding. The wound appears to be healed. Continue daily dressing changes and offloading at all times.  -Monitor for any clinical signs or symptoms of infection and directed to call the office immediately should any occur or go to the ER.  [name] DPM

## 2018-08-02 ENCOUNTER — Encounter: Payer: Self-pay | Admitting: Podiatry

## 2018-08-02 ENCOUNTER — Other Ambulatory Visit: Payer: Self-pay

## 2018-08-02 ENCOUNTER — Ambulatory Visit: Payer: BLUE CROSS/BLUE SHIELD | Admitting: Podiatry

## 2018-08-02 VITALS — Temp 97.3°F | Resp 16

## 2018-08-02 DIAGNOSIS — E0843 Diabetes mellitus due to underlying condition with diabetic autonomic (poly)neuropathy: Secondary | ICD-10-CM

## 2018-08-02 DIAGNOSIS — L84 Corns and callosities: Secondary | ICD-10-CM | POA: Diagnosis not present

## 2018-08-09 NOTE — Progress Notes (Signed)
Subjective: [name] [name] presents the office today for follow-up evaluation of a wound on the bottom of her left heel.  Overall she states that she is doing well.  She states is feeling much better and the wound is closed.  She states there is just a scab along the area.  She is been keeping antibiotic ointment on the area.  She denies any swelling, redness, drainage or pus.  She also states her last MD was 9 her blood sugar this morning was 115. Denies any systemic complaints such as fevers, chills, nausea, vomiting. No acute changes since last appointment, and no other complaints at this time.   Objective: AAO x3, NAD DP/PT pulses palpable bilaterally, CRT less than 3 seconds On the plantar aspect of the heel is a hyperkeratotic lesion.  Upon debridement appears that the wound has healed.  Small scab is present on the central aspect which is pre-ulcerative but there is no definitive skin breakdown.  She is back into wearing a sneaker with the orthotic and she states that this is been comfortable and she is been getting used to this as well.  No other areas of breakdown.  The hallux appears to be doing well without signs of infection or breakdown.  No open lesions. No open lesions or pre-ulcerative lesions.  No pain with calf compression, swelling, warmth, erythema  Assessment: Chronic ulceration left plantar heel, healed  Plan: -All treatment options discussed with the patient including all alternatives, risks, complications.  -I sharply debrided the hyperkeratotic tissue without any complications utilizing a #312 blade scalpel without any complications or bleeding.  Underlying wound is healed but is still pre-ulcerative.  I will keep a bandage on the area and we discussed continuing the orthotics to help offload the area.  Unfortunately it is very prominent on the plantar aspect of calcaneus which is causing the pressure point.  We need to continue offloading at all times to help prevent further skin  breakdown or reoccurrence. -Monitor for any clinical signs or symptoms of infection and directed to call the office immediately should any occur or go to the ER.  Return in about 3 weeks (around 08/23/2018).  Trula Slade DPM

## 2018-08-23 ENCOUNTER — Encounter: Payer: Self-pay | Admitting: Podiatry

## 2018-08-23 ENCOUNTER — Other Ambulatory Visit: Payer: Self-pay

## 2018-08-23 ENCOUNTER — Ambulatory Visit (INDEPENDENT_AMBULATORY_CARE_PROVIDER_SITE_OTHER): Payer: BC Managed Care – PPO | Admitting: Podiatry

## 2018-08-23 VITALS — Temp 97.4°F

## 2018-08-23 DIAGNOSIS — L97521 Non-pressure chronic ulcer of other part of left foot limited to breakdown of skin: Secondary | ICD-10-CM | POA: Diagnosis not present

## 2018-08-23 DIAGNOSIS — L84 Corns and callosities: Secondary | ICD-10-CM

## 2018-08-23 DIAGNOSIS — E0843 Diabetes mellitus due to underlying condition with diabetic autonomic (poly)neuropathy: Secondary | ICD-10-CM

## 2018-08-26 NOTE — Progress Notes (Signed)
Subjective: [name] [name] presents the office today for follow-up evaluation of a wound on the bottom of her left heel.  Overall she states that she is doing well.  The area is still healed and she is been wearing a shoe for term.  She also states that she is tried trimming her toenails and on the third toe she did cut some of the skin has had a wound.  No drainage or pus or any redness or swelling.  No red streaks.  She is been keeping antibiotic ointment on this.  Denies any fevers, chills, nausea, vomiting.  No calf pain, chest pain, shortness of breath.  Objective: AAO x3, NAD DP/PT pulses palpable bilaterally, CRT less than 3 seconds On the plantar aspect of the heel is a hyperkeratotic lesion.  Upon debridement there is no ongoing ulceration, drainage or any signs of infection.  No edema, erythema, increased warmth.  The lateral aspect of the left third toe is a superficial abrasion type lesion where she tried to take the toenail herself.  There is no drainage or pus or any redness or red streaks. No open lesions or pre-ulcerative lesions.  No pain with calf compression, swelling, warmth, erythema  Assessment: Chronic ulceration left plantar heel, healed; superficial wound left third  Plan: -All treatment options discussed with the patient including all alternatives, risks, complications.  -I sharply debrided the hyperkeratotic tissue without any complications utilizing a #312 blade scalpel without any complications or bleeding.  Continue with daily dressing changes to help with offloading.  Continue with shoes with inserts to help offload the wound. -Antibiotic ointment and a bandage third toe daily.  Monitor for any signs or symptoms of infection.  Return in about 4 weeks (around 09/20/2018).  Trula Slade DPM

## 2018-09-20 ENCOUNTER — Ambulatory Visit: Payer: BC Managed Care – PPO | Admitting: Podiatry

## 2018-09-20 ENCOUNTER — Other Ambulatory Visit: Payer: Self-pay

## 2018-09-20 ENCOUNTER — Encounter: Payer: Self-pay | Admitting: Podiatry

## 2018-09-20 VITALS — Temp 97.0°F

## 2018-09-20 DIAGNOSIS — L84 Corns and callosities: Secondary | ICD-10-CM

## 2018-09-20 DIAGNOSIS — E0843 Diabetes mellitus due to underlying condition with diabetic autonomic (poly)neuropathy: Secondary | ICD-10-CM

## 2018-09-26 NOTE — Progress Notes (Signed)
Subjective: [name] [name] presents the office today for follow-up evaluation of a wound on the bottom of her left heel.  She states this.  The area is healed.  She is wearing regular shoes and she is wearing a sandal today with a gel heel cup.  She denies any swelling or redness any drainage.  She has no other concerns. Denies any fevers, chills, nausea, vomiting.  No calf pain, chest pain, shortness of breath.  Objective: AAO x3, NAD DP/PT pulses palpable bilaterally, CRT less than 3 seconds On the plantar aspect of the heel is a hyperkeratotic lesion.  Upon debridement this area there is no ongoing ulceration identified.  There is some dried blood in the callus and upon debridement this was removed.  There is no edema, erythema.  No other open lesions are identified.  Wound to the third toe is healed.  There was some dried blood on the nails but she tried trimming them herself. No open lesions or pre-ulcerative lesions.  No pain with calf compression, swelling, warmth, erythema  Assessment: Chronic ulceration left plantar heel, healed  Plan: -All treatment options discussed with the patient including all alternatives, risks, complications.  -I debrided the hyperkeratotic tissue to the heel today without any complications or bleeding.  Recommend moisturizer daily.  Continue offloading at all times and monitor for any reoccurrence. -Debrided the nails x10 without any complications or bleeding  Return in about 9 weeks (around 11/22/2018).  Trula Slade DPM

## 2018-11-22 ENCOUNTER — Ambulatory Visit: Payer: BC Managed Care – PPO | Admitting: Podiatry

## 2018-12-03 ENCOUNTER — Ambulatory Visit: Payer: BC Managed Care – PPO | Admitting: Podiatry

## 2019-01-21 ENCOUNTER — Other Ambulatory Visit: Payer: Self-pay

## 2019-01-21 DIAGNOSIS — Z20822 Contact with and (suspected) exposure to covid-19: Secondary | ICD-10-CM

## 2019-01-22 LAB — NOVEL CORONAVIRUS, NAA: SARS-CoV-2, NAA: NOT DETECTED

## 2019-02-13 ENCOUNTER — Encounter (HOSPITAL_BASED_OUTPATIENT_CLINIC_OR_DEPARTMENT_OTHER): Payer: Self-pay | Admitting: Emergency Medicine

## 2019-02-13 ENCOUNTER — Emergency Department (HOSPITAL_BASED_OUTPATIENT_CLINIC_OR_DEPARTMENT_OTHER): Payer: BC Managed Care – PPO

## 2019-02-13 ENCOUNTER — Emergency Department (HOSPITAL_BASED_OUTPATIENT_CLINIC_OR_DEPARTMENT_OTHER)
Admission: EM | Admit: 2019-02-13 | Discharge: 2019-02-13 | Disposition: A | Discharge status: Home / Self Care | Payer: BC Managed Care – PPO | Attending: Emergency Medicine | Admitting: Emergency Medicine

## 2019-02-13 ENCOUNTER — Other Ambulatory Visit: Payer: Self-pay

## 2019-02-13 DIAGNOSIS — I129 Hypertensive chronic kidney disease with stage 1 through stage 4 chronic kidney disease, or unspecified chronic kidney disease: Secondary | ICD-10-CM | POA: Diagnosis not present

## 2019-02-13 DIAGNOSIS — N183 Chronic kidney disease, stage 3 unspecified: Secondary | ICD-10-CM | POA: Diagnosis not present

## 2019-02-13 DIAGNOSIS — S0990XA Unspecified injury of head, initial encounter: Secondary | ICD-10-CM | POA: Insufficient documentation

## 2019-02-13 DIAGNOSIS — Y999 Unspecified external cause status: Secondary | ICD-10-CM | POA: Diagnosis not present

## 2019-02-13 DIAGNOSIS — E1022 Type 1 diabetes mellitus with diabetic chronic kidney disease: Secondary | ICD-10-CM | POA: Diagnosis not present

## 2019-02-13 DIAGNOSIS — Z793 Long term (current) use of hormonal contraceptives: Secondary | ICD-10-CM | POA: Insufficient documentation

## 2019-02-13 DIAGNOSIS — E039 Hypothyroidism, unspecified: Secondary | ICD-10-CM | POA: Diagnosis not present

## 2019-02-13 DIAGNOSIS — Z23 Encounter for immunization: Secondary | ICD-10-CM | POA: Insufficient documentation

## 2019-02-13 DIAGNOSIS — W19XXXA Unspecified fall, initial encounter: Secondary | ICD-10-CM | POA: Insufficient documentation

## 2019-02-13 DIAGNOSIS — S0181XA Laceration without foreign body of other part of head, initial encounter: Secondary | ICD-10-CM | POA: Insufficient documentation

## 2019-02-13 DIAGNOSIS — Y929 Unspecified place or not applicable: Secondary | ICD-10-CM | POA: Insufficient documentation

## 2019-02-13 DIAGNOSIS — Z79899 Other long term (current) drug therapy: Secondary | ICD-10-CM | POA: Insufficient documentation

## 2019-02-13 DIAGNOSIS — Y939 Activity, unspecified: Secondary | ICD-10-CM | POA: Diagnosis not present

## 2019-02-13 HISTORY — DX: Unspecified osteoarthritis, unspecified site: M19.90

## 2019-02-13 HISTORY — DX: Type 2 diabetes mellitus with diabetic polyneuropathy: E11.42

## 2019-02-13 MED ORDER — TETANUS-DIPHTH-ACELL PERTUSSIS 5-2.5-18.5 LF-MCG/0.5 IM SUSP
0.5000 mL | Freq: Once | INTRAMUSCULAR | Status: AC
  Administered 2019-02-13: 0.5 mL via INTRAMUSCULAR
  Filled 2019-02-13: qty 0.5

## 2019-02-13 NOTE — ED Notes (Signed)
ED Provider at bedside. 

## 2019-02-13 NOTE — ED Triage Notes (Signed)
Pt reports getting out of bed to go to bathroom, became dizzy and fell. Pt hit chin and right ear on floor. Pt unsure if she had a loss of consciousness. Pt has laceration on chin.

## 2019-02-13 NOTE — ED Provider Notes (Signed)
[facility] EMERGENCY DEPARTMENT Provider Note   CSN: [id number] Arrival date & time: 02/13/19  0131     History Chief Complaint  Patient presents with  . Fall    [name] [name] is a 53 y.o. female.  The history is provided by the patient.  Fall This is a new problem. The current episode started less than 1 hour ago. The problem occurs constantly. The problem has been resolved. Pertinent negatives include no chest pain, no abdominal pain, no headaches and no shortness of breath. Nothing aggravates the symptoms. Nothing relieves the symptoms. She has tried nothing for the symptoms. The treatment provided no relief.  Patient got up quickly to use the bathroom and felt lightheaded and fell striking her chin on the bathroom floor.  No weakness no numbness no changes in vision or speech.       Past Medical History:  Diagnosis Date  . Arthritis   . Diabetes mellitus without complication (HCC)   . Diabetic polyneuropathy (HCC)   . Diabetic polyneuropathy [facility](HCC)     Patient Active Problem List   Diagnosis Date Noted  . Ulcer of left heel and midfoot with fat layer exposed (HCC) 10/12/2016  . Acute osteomyelitis of toe of right foot (HCC) 08/04/2016  . Acute sinusitis 08/04/2016  . Cellulitis of foot 08/04/2016  . Cervical high risk HPV (human papillomavirus) test positive 08/04/2016  . Charcot's joint of left ankle 08/04/2016  . Conjunctivitis 08/04/2016  . Herniated lumbar intervertebral disc 08/04/2016  . Low back pain 08/04/2016  . Neuropathic pain of both legs 07/29/2016  . MSSA (methicillin susceptible Staphylococcus aureus) 07/21/2016  . Diabetic ulcer of left heel associated with type 1 diabetes mellitus (HCC) 03/15/2016  . Diabetes mellitus type I (HCC) 03/15/2016  . Closed nondisplaced fracture of right clavicle 02/09/2016  . Diabetes mellitus type 2 in nonobese (HCC) 12/24/2015  . Localized edema 12/24/2015  . Microalbuminuria due to type 1 diabetes  mellitus (HCC) 08/12/2015  . Bloating 06/18/2015  . Weight gain 06/18/2015  . Bulging lumbar disc 06/17/2015  . Cervical high risk human papillomavirus (HPV) DNA test positive 06/17/2015  . CKD stage 3 due to type 1 diabetes mellitus (HCC) 06/17/2015  . Hyperlipidemia 06/17/2015  . Hypothyroidism 06/17/2015  . Diabetic neuropathy (HCC) 06/17/2015  . Oral contraceptive pill surveillance 06/10/2015  . Hypoglycemia due to type 1 diabetes mellitus (HCC) 05/31/2015  . Acquired hypothyroidism 04/08/2015  . Anemia 04/08/2015  . Serum albumin decreased 04/08/2015  . Status post amputation of lesser toe (HCC) 04/08/2015  . Acute osteomyelitis of right foot (HCC) 03/27/2015  . Cellulitis and abscess of foot 03/27/2015  . Essential hypertension 03/27/2015  . Osteomyelitis of ankle or foot 03/27/2015  . Type 1 diabetes mellitus with hyperglycemia (HCC) 03/27/2015  . Charcot's arthropathy 01/08/2015  . Closed avulsion fracture of left calcaneus 01/08/2015    History reviewed. No pertinent surgical history.   OB History   No obstetric history on file.     History reviewed. No pertinent family history.  Social History   Tobacco Use  . Smoking status: Never Smoker  . Smokeless tobacco: Never Used  Substance Use Topics  . Alcohol use: Not on file  . Drug use: Never    Home Medications Prior to Admission medications   Medication Sig Start Date End Date Taking? Authorizing Provider  cephALEXin (KEFLEX) 500 MG capsule Take 1 capsule (500 mg total) by mouth 3 (three) times daily. 03/26/18   Vivi BarrackWagoner, Matthew R, DPM  estradiol-norethindrone (ACTIVELLA) 1-0.5 MG tablet Take by mouth. 07/20/18   [provider]  furosemide (LASIX) 20 MG tablet Take 20 mg by mouth. 07/06/15   [provider]  gabapentin (NEURONTIN) 300 MG capsule Take 300 mg by mouth.    [provider]  ibuprofen (ADVIL,MOTRIN) 800 MG tablet  12/31/15   [provider]  Insulin Glargine, 1  Unit Dial, (TOUJEO SOLOSTAR) 300 UNIT/ML SOPN Use as directed 04/08/15   [provider]  insulin lispro (HUMALOG KWIKPEN) 100 UNIT/ML KiwkPen INJECT 0.03-0.1 ML (3-10 UNITS TOTAL) UNDER THE SKIN THREE (3) TIMES A DAY BEFORE MEALS. 12/02/15   [provider]  Insulin Pen Needle (B-D ULTRAFINE III SHORT PEN) 31G X 8 MM MISC  03/31/15   [provider]  Levothyroxine Sodium 88 MCG CAPS Take by mouth.    [provider]  lisinopril (ZESTRIL) 5 MG tablet Take by mouth. 07/26/18   [provider]  MIMVEY 1-0.5 MG tablet Take 1 tablet by mouth daily. 07/20/18   [provider]  Norethindrone-Ethinyl Estradiol-Fe Biphas (LO LOESTRIN FE) 1 MG-10 MCG / 10 MCG tablet  07/31/15   [provider]  omeprazole (PRILOSEC) 20 MG capsule TAKE 1 CAPSULE (20 MG TOTAL) BY MOUTH 2 TIMES DAILY 07/25/18   [provider]  ONETOUCH VERIO test strip USE TO CHECK BLOOD SUGARS FOUR TIMES DAILY 12/15/17   [provider]  ramipril (ALTACE) 2.5 MG capsule TAKE 1 CAPSULE (2.5 MG TOTAL) BY MOUTH DAILY. 12/16/17   [provider]  silver nitrate applicators 75-25 % applicator Apply topically 3 (three) times a week. 01/25/17   [name], DPM  sulfamethoxazole-trimethoprim (BACTRIM DS) 800-160 MG tablet Take 1 tablet by mouth 2 (two) times daily. 06/26/18   [name], DPM  Wound Dressings (MEDIHONEY WOUND/BURN DRESSING) GEL Apply to affected are 3 times a week, and cover with sterile dressing. 12/26/17   [name], DPM    Allergies    Patient has no known allergies.  Review of Systems   Review of Systems  HENT: Negative for congestion.   Eyes: Negative for visual disturbance.  Respiratory: Negative for shortness of breath.   Cardiovascular: Negative for chest pain.  Gastrointestinal: Negative for abdominal pain.  Genitourinary: Negative for difficulty urinating.  Musculoskeletal: Negative for arthralgias.  Skin:  Positive for wound.  Neurological: Negative for facial asymmetry, weakness, numbness and headaches.  Psychiatric/Behavioral: Negative for agitation.  All other systems reviewed and are negative.   Physical Exam Updated Vital Signs BP 101/68 (BP Location: Left Arm)   Pulse 80   Temp 98 F (36.7 C) (Oral)   Resp 16   Ht 5\' 6"  (1.676 m)   Wt 49.9 kg   SpO2 100%   BMI 17.75 kg/m   Physical Exam Vitals and nursing note reviewed.  Constitutional:      General: She is not in acute distress.    Appearance: She is normal weight.  HENT:     Head: Normocephalic.      Nose: Nose normal.  Eyes:     Conjunctiva/sclera: Conjunctivae normal.     Pupils: Pupils are equal, round, and reactive to light.  Cardiovascular:     Rate and Rhythm: Normal rate and regular rhythm.     Pulses: Normal pulses.     Heart sounds: Normal heart sounds.  Pulmonary:     Effort: Pulmonary effort is normal.     Breath sounds: Normal breath sounds.  Abdominal:  General: Abdomen is flat. Bowel sounds are normal.     Tenderness: There is no abdominal tenderness. There is no guarding.  Musculoskeletal:        General: Normal range of motion.     Cervical back: Normal range of motion and neck supple.  Skin:    General: Skin is warm and dry.     Capillary Refill: Capillary refill takes less than 2 seconds.  Neurological:     General: No focal deficit present.     Mental Status: She is alert and oriented to person, place, and time.     Deep Tendon Reflexes: Reflexes normal.  Psychiatric:        Mood and Affect: Mood normal.        Behavior: Behavior normal.     ED Results / Procedures / Treatments   Labs (all labs ordered are listed, but only abnormal results are displayed) Labs Reviewed - No data to display  EKG EKG Interpretation  Date/Time:  Wednesday February 13 2019 01:42:13 EST Ventricular Rate:  81 PR Interval:    QRS Duration: 88 QT Interval:  396 QTC Calculation: 460 R  Axis:   56 Text Interpretation: Sinus rhythm Confirmed by Dory Horn) on 02/13/2019 1:45:27 AM   Radiology CT Head Wo Contrast  Result Date: 02/13/2019 CLINICAL DATA:  Dizziness leading to fall after getting out of bed to go to the bathroom. Struck chin and right ear on floor. Unknown loss of consciousness. EXAM: CT HEAD WITHOUT CONTRAST CT CERVICAL SPINE WITHOUT CONTRAST TECHNIQUE: Multidetector CT imaging of the head and cervical spine was performed following the standard protocol without intravenous contrast. Multiplanar CT image reconstructions of the cervical spine were also generated. COMPARISON:  Head CT 01/31/2016 FINDINGS: CT HEAD FINDINGS Brain: No intracranial hemorrhage, mass effect, or midline shift. No hydrocephalus. The basilar cisterns are patent. No evidence of territorial infarct or acute ischemia. No extra-axial or intracranial fluid collection. Vascular: No hyperdense vessel. Skull: Normal. Negative for fracture or focal lesion. Sinuses/Orbits: Paranasal sinuses and mastoid air cells are clear. The visualized orbits are unremarkable. Other: None. CT CERVICAL SPINE FINDINGS Alignment: Normal. Skull base and vertebrae: No acute fracture. Vertebral body heights are maintained. The dens and skull base are intact. Soft tissues and spinal canal: No prevertebral fluid or swelling. No visible canal hematoma. Disc levels:  Mild C6-C7 disc protrusion. Disc spaces are preserved. Upper chest: Clear. Other: None. IMPRESSION: 1. Unremarkable noncontrast head CT. 2. No fracture or subluxation of the cervical spine. Electronically Signed   By: Keith Rake M.D.   On: 02/13/2019 02:30   CT Cervical Spine Wo Contrast  Result Date: 02/13/2019 CLINICAL DATA:  Dizziness leading to fall after getting out of bed to go to the bathroom. Struck chin and right ear on floor. Unknown loss of consciousness. EXAM: CT HEAD WITHOUT CONTRAST CT CERVICAL SPINE WITHOUT CONTRAST TECHNIQUE: Multidetector  CT imaging of the head and cervical spine was performed following the standard protocol without intravenous contrast. Multiplanar CT image reconstructions of the cervical spine were also generated. COMPARISON:  Head CT 01/31/2016 FINDINGS: CT HEAD FINDINGS Brain: No intracranial hemorrhage, mass effect, or midline shift. No hydrocephalus. The basilar cisterns are patent. No evidence of territorial infarct or acute ischemia. No extra-axial or intracranial fluid collection. Vascular: No hyperdense vessel. Skull: Normal. Negative for fracture or focal lesion. Sinuses/Orbits: Paranasal sinuses and mastoid air cells are clear. The visualized orbits are unremarkable. Other: None. CT CERVICAL SPINE FINDINGS Alignment: Normal. Skull base  and vertebrae: No acute fracture. Vertebral body heights are maintained. The dens and skull base are intact. Soft tissues and spinal canal: No prevertebral fluid or swelling. No visible canal hematoma. Disc levels:  Mild C6-C7 disc protrusion. Disc spaces are preserved. Upper chest: Clear. Other: None. IMPRESSION: 1. Unremarkable noncontrast head CT. 2. No fracture or subluxation of the cervical spine. Electronically Signed   By: Narda Rutherford M.D.   On: 02/13/2019 02:30    Procedures .Marland KitchenLaceration Repair  Date/Time: 02/13/2019 3:03 AM Performed by: Cy Blamer, MD Authorized by: Cy Blamer, MD   Consent:    Consent obtained:  Verbal   Consent given by:  Patient   Risks discussed:  Nerve damage, need for additional repair, infection, pain, poor cosmetic result, poor wound healing, retained foreign body, tendon damage and vascular damage   Alternatives discussed:  No treatment Anesthesia (see MAR for exact dosages):    Anesthesia method:  None Laceration details:    Location: chin.   Length (cm):  1.3   Depth (mm):  1 Repair type:    Repair type:  Simple Pre-procedure details:    Preparation:  Patient was prepped and draped in usual sterile  fashion Exploration:    Hemostasis achieved with:  Direct pressure   Wound exploration: wound explored through full range of motion     Wound extent: no areolar tissue violation noted, no fascia violation noted and no foreign bodies/material noted     Contaminated: no   Treatment:    Area cleansed with:  Betadine (chlorhexidine)   Amount of cleaning:  Extensive Skin repair:    Repair method:  Tissue adhesive Approximation:    Approximation:  Close Post-procedure details:    Dressing:  Sterile dressing   Patient tolerance of procedure:  Tolerated well, no immediate complications   (including critical care time)  Medications Ordered in ED Medications  Tdap (BOOSTRIX) injection 0.5 mL (0.5 mLs Intramuscular Given 02/13/19 0156)    ED Course  I have reviewed the triage vital signs and the nursing notes.  Pertinent labs & imaging results that were available during my care of the patient were reviewed by me and considered in my medical decision making (see chart for details).    Tolerated well.  Wound came together well approximated.  No internal trauma on CT scan.  Stable for discharge with close follow up.    [name] [name] was evaluated in Emergency Department on 02/13/2019 for the symptoms described in the history of present illness. She was evaluated in the context of the global COVID-19 pandemic, which necessitated consideration that the patient might be at risk for infection with the SARS-CoV-2 virus that causes COVID-19. Institutional protocols and algorithms that pertain to the evaluation of patients at risk for COVID-19 are in a state of rapid change based on information released by regulatory bodies including the CDC and federal and state organizations. These policies and algorithms were followed during the patient's care in the ED.  Final Clinical Impression(s) / ED Diagnoses Final diagnoses:  Fall, initial encounter  Facial laceration, initial encounter    Return for  intractable cough, coughing up blood,fevers >100.4 unrelieved by medication, shortness of breath, intractable vomiting, chest pain, shortness of breath, weakness,numbness, changes in speech, facial asymmetry,abdominal pain, passing out,Inability to tolerate liquids or food, cough, altered mental status or any concerns. No signs of systemic illness or infection. The patient is nontoxic-appearing on exam and vital signs are within normal limits.   I have reviewed the  triage vital signs and the nursing notes. Pertinent labs &imaging results that were available during my care of the patient were reviewed by me and considered in my medical decision making (see chart for details).  After history, exam, and medical workup I feel the patient has been appropriately medically screened and is safe for discharge home. Pertinent diagnoses were discussed with the patient. Patient was given return     Dakia Schifano, MD 02/13/19 [id number]

## 2019-02-13 NOTE — ED Notes (Signed)
Patient transported to CT 

## 2019-04-10 ENCOUNTER — Other Ambulatory Visit: Payer: Self-pay | Admitting: Nurse Practitioner

## 2019-04-10 DIAGNOSIS — U071 COVID-19: Secondary | ICD-10-CM

## 2019-04-10 DIAGNOSIS — E119 Type 2 diabetes mellitus without complications: Secondary | ICD-10-CM

## 2019-04-10 NOTE — Progress Notes (Signed)
  I connected by phone with [name] [name] on 04/10/2019 at 3:22 PM to discuss the potential use of an new treatment for mild to moderate COVID-19 viral infection in non-hospitalized patients.  This patient is a 54 y.o. female that meets the FDA criteria for Emergency Use Authorization of bamlanivimab or casirivimab\imdevimab.  Has a (+) direct SARS-CoV-2 viral test result  Has mild or moderate COVID-19   Is ? 54 years of age and weighs ? 40 kg  Is NOT hospitalized due to COVID-19  Is NOT requiring oxygen therapy or requiring an increase in baseline oxygen flow rate due to COVID-19  Is within 10 days of symptom onset  Has at least one of the high risk factor(s) for progression to severe COVID-19 and/or hospitalization as defined in EUA.  Specific high risk criteria : Diabetes   I have spoken and communicated the following to the patient or parent/caregiver:  1. FDA has authorized the emergency use of bamlanivimab and casirivimab\imdevimab for the treatment of mild to moderate COVID-19 in adults and pediatric patients with positive results of direct SARS-CoV-2 viral testing who are 7 years of age and older weighing at least 40 kg, and who are at high risk for progressing to severe COVID-19 and/or hospitalization.  2. The significant known and potential risks and benefits of bamlanivimab and casirivimab\imdevimab, and the extent to which such potential risks and benefits are unknown.  3. Information on available alternative treatments and the risks and benefits of those alternatives, including clinical trials.  4. Patients treated with bamlanivimab and casirivimab\imdevimab should continue to self-isolate and use infection control measures (e.g., wear mask, isolate, social distance, avoid sharing personal items, clean and disinfect "high touch" surfaces, and frequent handwashing) according to CDC guidelines.   5. The patient or parent/caregiver has the option to accept or refuse  bamlanivimab or casirivimab\imdevimab .  After reviewing this information with the patient, The patient agreed to proceed with receiving the bamlanimivab infusion and will be provided a copy of the Fact sheet prior to receiving the infusion.[name] 04/10/2019 3:22 PM

## 2019-04-11 ENCOUNTER — Ambulatory Visit (HOSPITAL_COMMUNITY)
Admission: RE | Admit: 2019-04-11 | Discharge: 2019-04-11 | Disposition: A | Discharge status: Home / Self Care | Payer: BC Managed Care – PPO | Source: Ambulatory Visit | Attending: Pulmonary Disease | Admitting: Pulmonary Disease

## 2019-04-11 DIAGNOSIS — U071 COVID-19: Secondary | ICD-10-CM | POA: Insufficient documentation

## 2019-04-11 DIAGNOSIS — E119 Type 2 diabetes mellitus without complications: Secondary | ICD-10-CM | POA: Diagnosis not present

## 2019-04-11 DIAGNOSIS — Z23 Encounter for immunization: Secondary | ICD-10-CM | POA: Diagnosis not present

## 2019-04-11 MED ORDER — DIPHENHYDRAMINE HCL 50 MG/ML IJ SOLN: 50.0000 mg | Freq: Once | INTRAMUSCULAR | Status: DC | PRN

## 2019-04-11 MED ORDER — EPINEPHRINE 0.3 MG/0.3ML IJ SOAJ: 0.3000 mg | Freq: Once | INTRAMUSCULAR | Status: DC | PRN

## 2019-04-11 MED ORDER — SODIUM CHLORIDE 0.9 % IV SOLN
700.0000 mg | Freq: Once | INTRAVENOUS | Status: AC
  Administered 2019-04-11: 700 mg via INTRAVENOUS
  Filled 2019-04-11: qty 20

## 2019-04-11 MED ORDER — FAMOTIDINE IN NACL 20-0.9 MG/50ML-% IV SOLN: 20.0000 mg | Freq: Once | INTRAVENOUS | Status: DC | PRN

## 2019-04-11 MED ORDER — ALBUTEROL SULFATE HFA 108 (90 BASE) MCG/ACT IN AERS: 2.0000 | INHALATION_SPRAY | Freq: Once | RESPIRATORY_TRACT | Status: DC | PRN

## 2019-04-11 MED ORDER — SODIUM CHLORIDE 0.9 % IV SOLN
INTRAVENOUS | Status: DC | PRN
  Administered 2019-04-11: 250 mL via INTRAVENOUS

## 2019-04-11 MED ORDER — METHYLPREDNISOLONE SODIUM SUCC 125 MG IJ SOLR: 125.0000 mg | Freq: Once | INTRAMUSCULAR | Status: DC | PRN

## 2019-04-11 NOTE — Progress Notes (Signed)
  Diagnosis: COVID-19  Physician: Dr. Patrick Wright Procedure: [facility] Med: bamlanivimab infusion - Provided patient with bamlanimivab fact sheet for patients, parents and caregivers prior to infusion.  Complications: No immediate complications noted.  Discharge: Discharged home   Ally Yow 04/11/2019   

## 2019-04-11 NOTE — Discharge Instructions (Signed)
What types of side effects do monoclonal antibody drugs cause?  °Common side effects ° °In general, the more common side effects caused by monoclonal antibody drugs include: °• Allergic reactions, such as hives or itching °• Flu-like signs and symptoms, including chills, fatigue, fever, and muscle aches and pains °• Nausea, vomiting °• Diarrhea °• Skin rashes °• Low blood pressure ° ° °The CDC is recommending patients who receive monoclonal antibody treatments wait at least 90 days before being vaccinated. ° °Currently, there are no data on the safety and efficacy of mRNA COVID-19 vaccines in persons who received monoclonal antibodies or convalescent plasma as part of COVID-19 treatment. Based on the estimated half-life of such therapies as well as evidence suggesting that reinfection is uncommon in the 90 days after initial infection, vaccination should be deferred for at least 90 days, as a precautionary measure until additional information becomes available, to avoid interference of the antibody treatment with vaccine-induced immune responses. ° ° °10 Things You Can Do to Manage Your COVID-19 Symptoms at Home °If you have possible or confirmed COVID-19: °1. Stay home from work and school. And stay away from other public places. If you must go out, avoid using any kind of public transportation, ridesharing, or taxis. °2. Monitor your symptoms carefully. If your symptoms get worse, call your healthcare provider immediately. °3. Get rest and stay hydrated. °4. If you have a medical appointment, call the healthcare provider ahead of time and tell them that you have or may have COVID-19. °5. For medical emergencies, call 911 and notify the dispatch personnel that you have or may have COVID-19. °6. Cover your cough and sneezes with a tissue or use the inside of your elbow. °7. Wash your hands often with soap and water for at least 20 seconds or clean your hands with an alcohol-based hand sanitizer that contains at  least 60% alcohol. °8. As much as possible, stay in a specific room and away from other people in your home. Also, you should use a separate bathroom, if available. If you need to be around other people in or outside of the home, wear a mask. °9. Avoid sharing personal items with other people in your household, like dishes, towels, and bedding. °10. Clean all surfaces that are touched often, like counters, tabletops, and doorknobs. Use household cleaning sprays or wipes according to the label instructions. °cdc.gov/coronavirus °08/29/2018 °This information is not intended to replace advice given to you by your health care provider. Make sure you discuss any questions you have with your health care provider. °Document Revised: 01/31/2019 Document Reviewed: 01/31/2019 °Elsevier Patient Education © 2020 [employer] °What types of side effects do monoclonal antibody drugs cause?  °Common side effects ° °In general, the more common side effects caused by monoclonal antibody drugs include: °• Allergic reactions, such as hives or itching °• Flu-like signs and symptoms, including chills, fatigue, fever, and muscle aches and pains °• Nausea, vomiting °• Diarrhea °• Skin rashes °• Low blood pressure ° ° °The CDC is recommending patients who receive monoclonal antibody treatments wait at least 90 days before being vaccinated. ° °Currently, there are no data on the safety and efficacy of mRNA COVID-19 vaccines in persons who received monoclonal antibodies or convalescent plasma as part of COVID-19 treatment. Based on the estimated half-life of such therapies as well as evidence suggesting that reinfection is uncommon in the 90 days after initial infection, vaccination should be deferred for at least 90 days, as a precautionary measure until   additional information becomes available, to avoid interference of the antibody treatment with vaccine-induced immune responses. °

## 2019-04-15 ENCOUNTER — Telehealth: Payer: Self-pay | Admitting: Nurse Practitioner

## 2019-04-15 NOTE — Telephone Encounter (Signed)
See note: Scheduled for [facility]

## 2019-05-25 IMAGING — DX DG FOOT COMPLETE 3+V*L*
3 series · 3 of 3 positions shown · non-contrast
Comparison: Radiographs 08/04/2016 and 09/27/2016.

CLINICAL DATA: Chronic heel ulcer. Previous surgery. Osteomyelitis.

EXAM:
LEFT FOOT - COMPLETE 3+ VIEW

[foot ap]
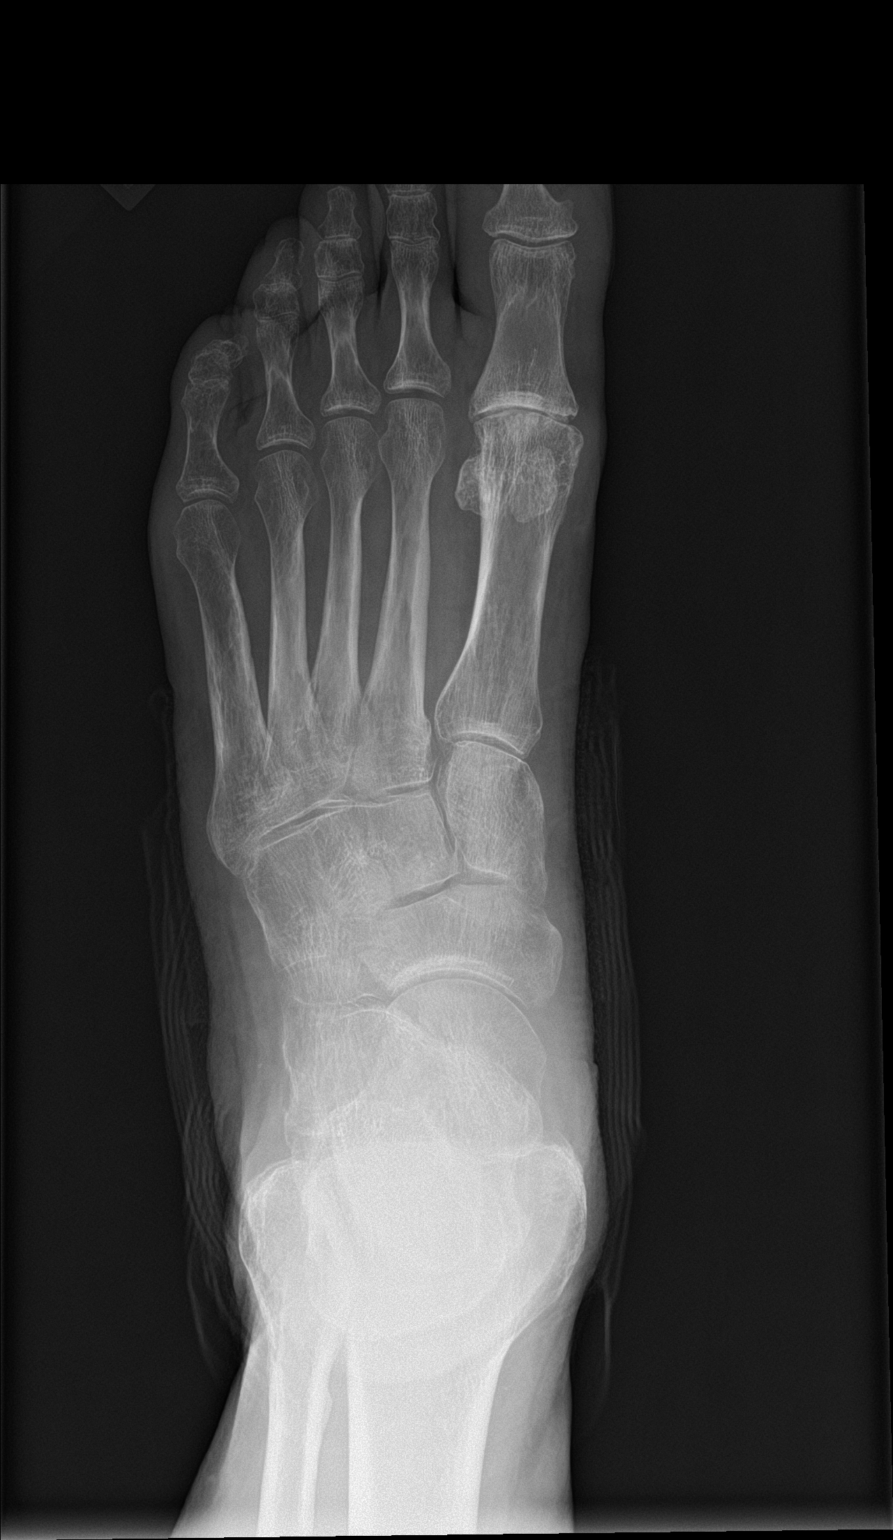

[foot obl]
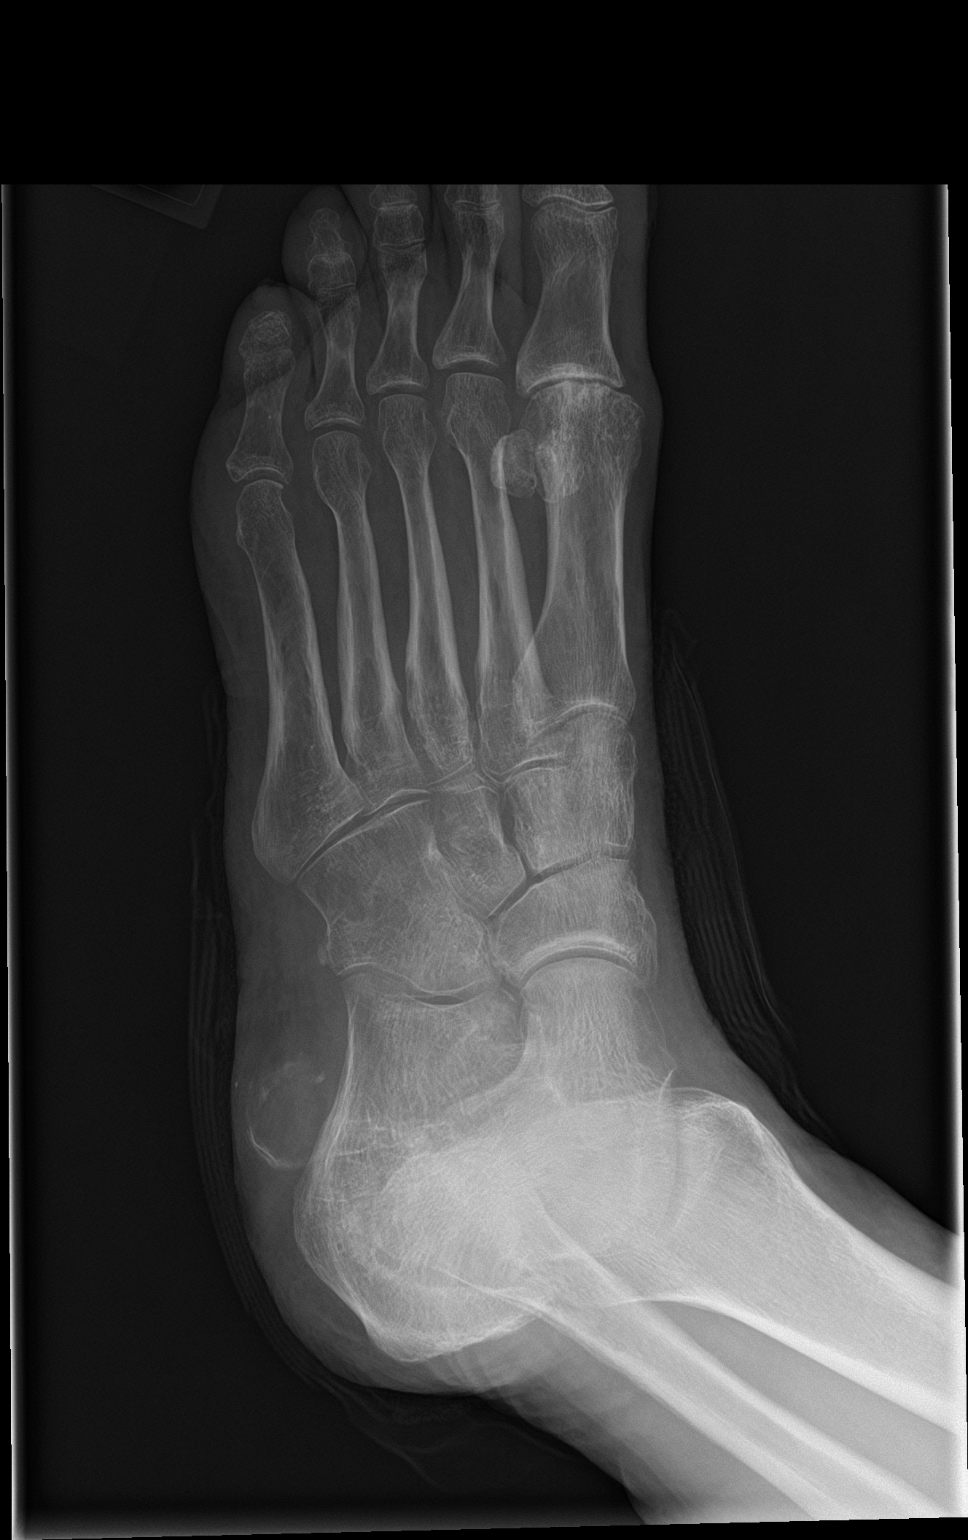

[foot lat]
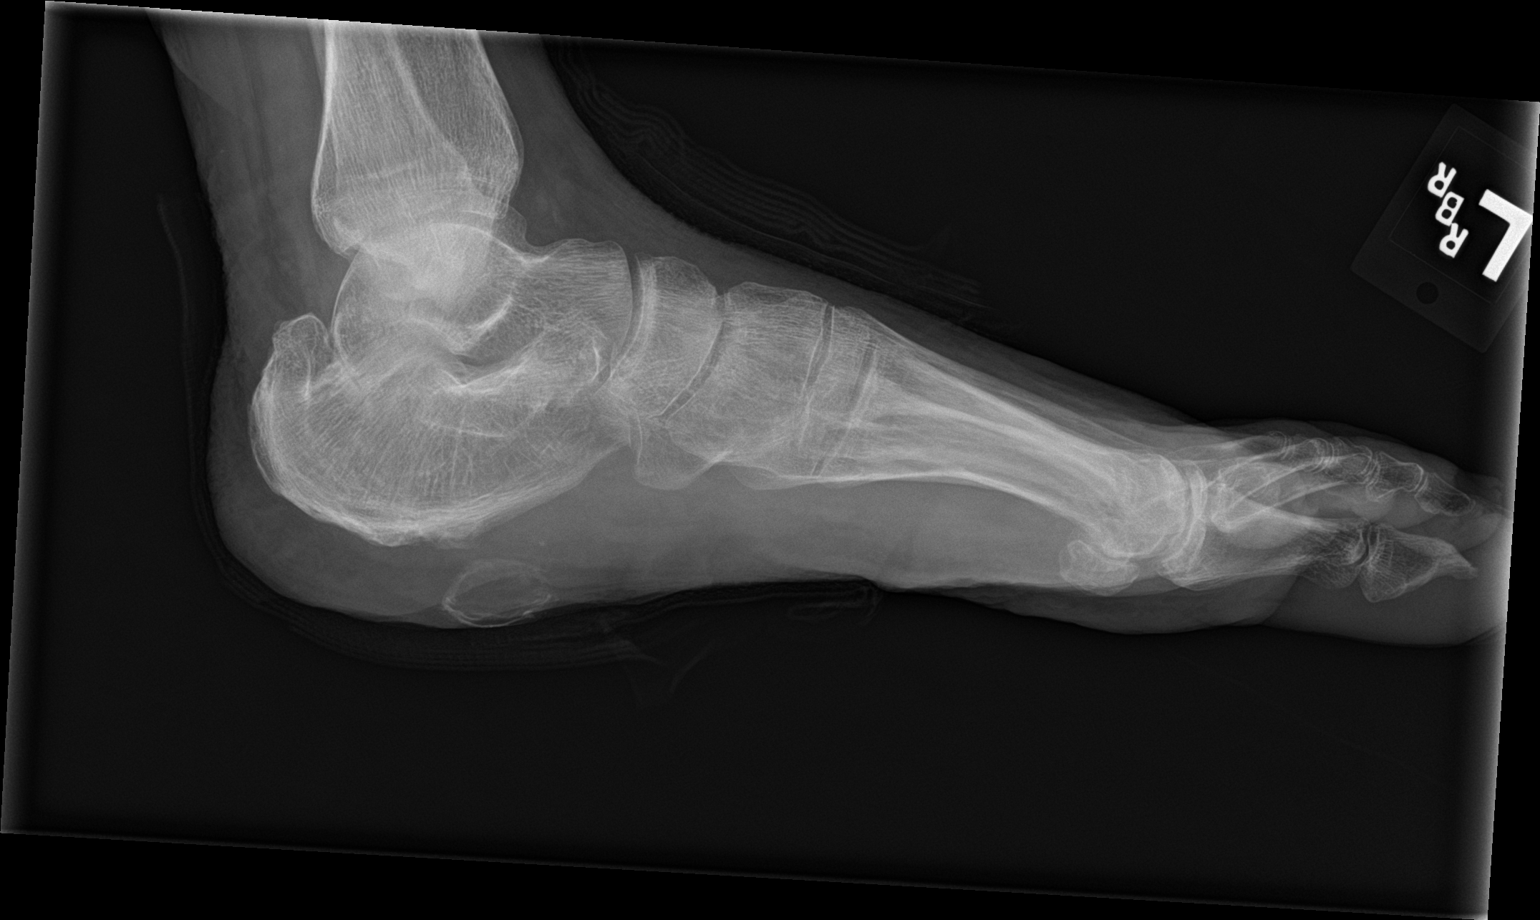

[3 of 3 positions shown; findings below may reference images not displayed]

FINDINGS: The bones are demineralized. There is stable posttraumatic or
postsurgical deformity of the calcaneus. There is stable erosion of
the distal first phalanx. No acute fracture or progressive bone
destruction demonstrated. Moderate degenerative changes are present
at the first metatarsal phalangeal joint.

Soft tissue irregularity over the plantar aspect of the hindfoot is
similar to the prior study. There is some high-density in this area,
likely related to overlying bandages or ligament. There appears to
be some soft tissue swelling in the great toe.
IMPRESSION: 1. Stable chronic findings in the calcaneus and distal first
phalanx. No progressive bone destruction demonstrated to suggest
active osteomyelitis.
2. Plantar hindfoot soft tissue ulcer. Suggested soft tissue
swelling in the great toe.

## 2019-11-05 IMAGING — MR MR FOOT*L* WO/W CM
5 of 9 series · 20 of 40 positions shown · IV contrast (10ml multihance)
Comparison: Left foot x-rays dated April 20, 2017. MRI left
ankle dated July 18, 2016.

CLINICAL DATA: Open diabetic wounds on the bottom of the heel and
great toe. Evaluate for osteomyelitis.

EXAM:
MRI OF THE LEFT FOREFOOT WITHOUT AND WITH CONTRAST
TECHNIQUE: Multiplanar, multisequence MR imaging of the left foot was performed
both before and after administration of intravenous contrast.

[Series 4: T2 fat-sat · coronal · 4.0mm · 0.27mm/px · 4 of 48 slices shown]
[im 1/48]
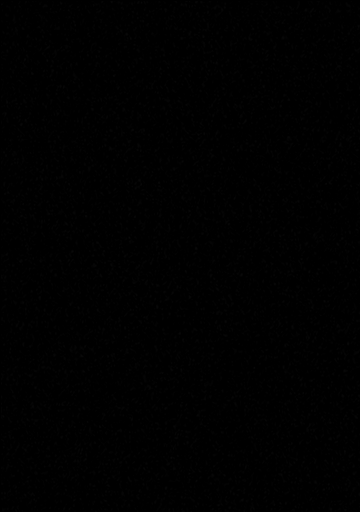
[im 16/48]
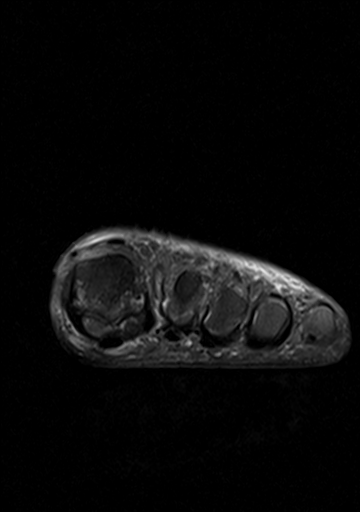
[im 32/48]
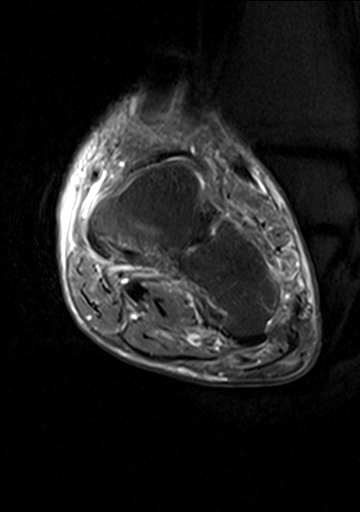
[im 48/48]
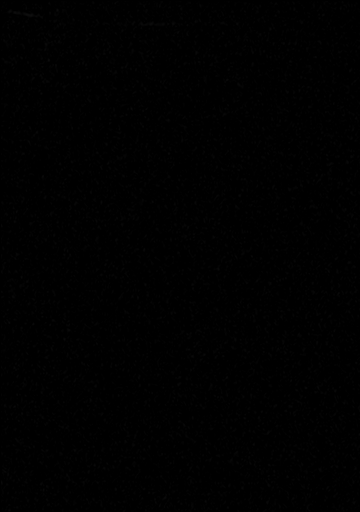

[Series 5: T1 · coronal · 4.0mm · 0.22mm/px · 4 of 48 slices shown (1 of 2)]
[im 1/48]
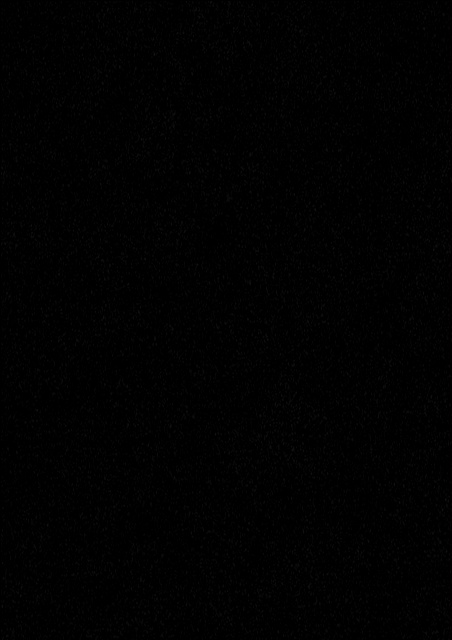
[im 16/48]
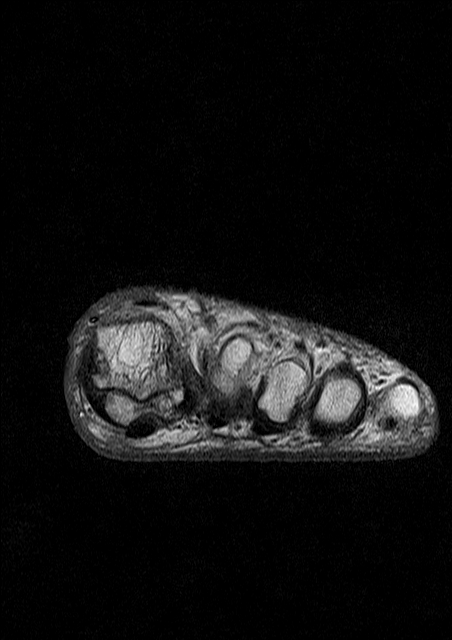
[im 32/48]
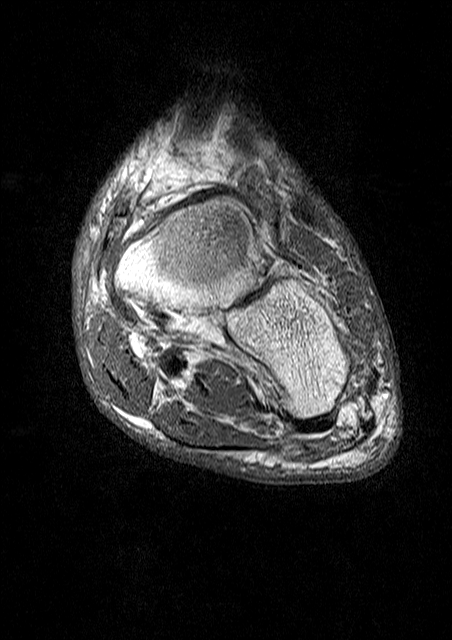
[im 48/48]
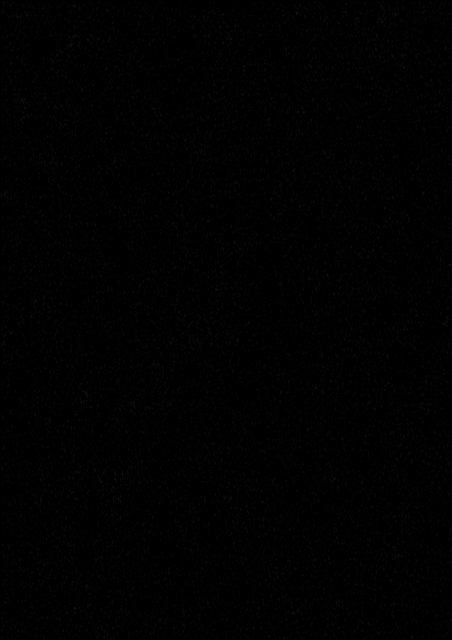

[Series 6: T1 · axial · 2.0mm · 0.51mm/px · z∈[-65,+34]mm · 5 of 46 slices shown (2 of 2)]
[im 1/46]
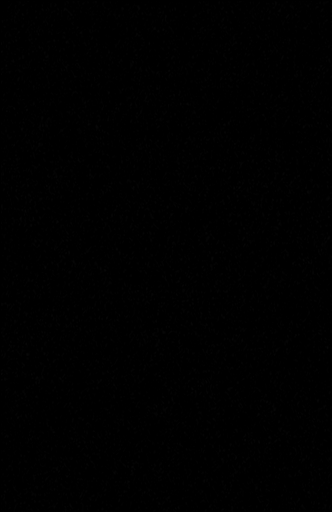
[im 12/46]
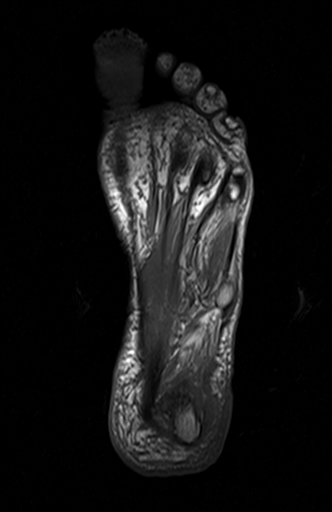
[im 23/46]
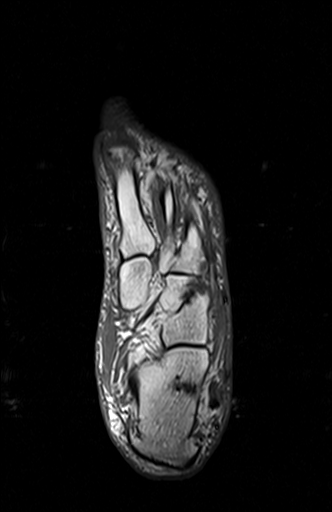
[im 34/46]
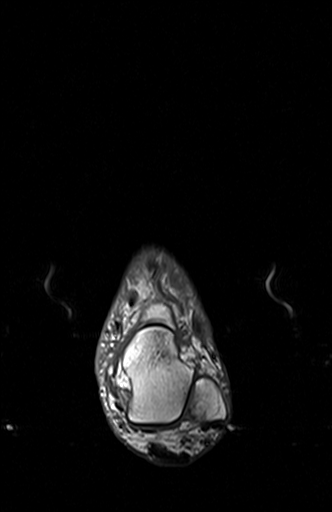
[im 46/46]
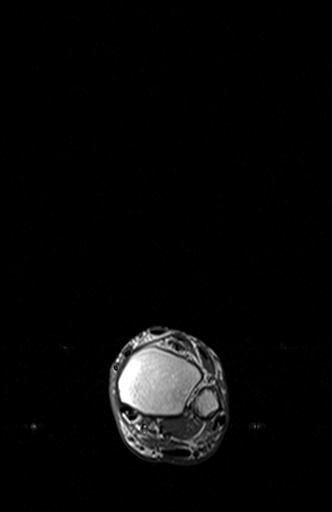

[Series 9: T1 fat-sat · coronal · 4.0mm · 0.25mm/px · 5 of 48 slices shown (1 of 2)]
[im 1/48]
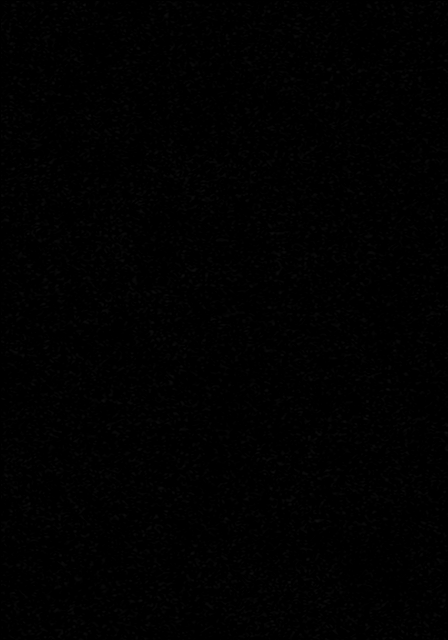
[im 12/48]
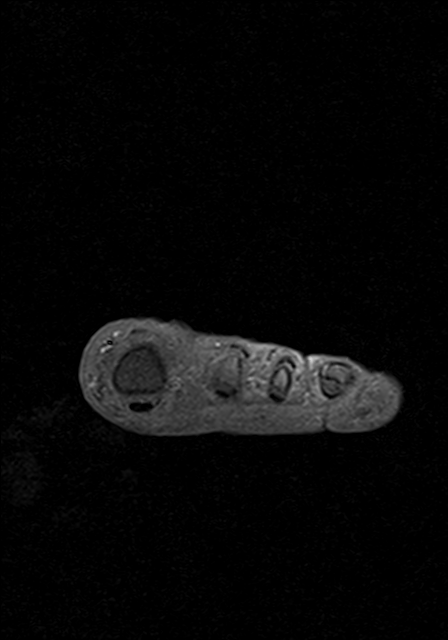
[im 24/48]
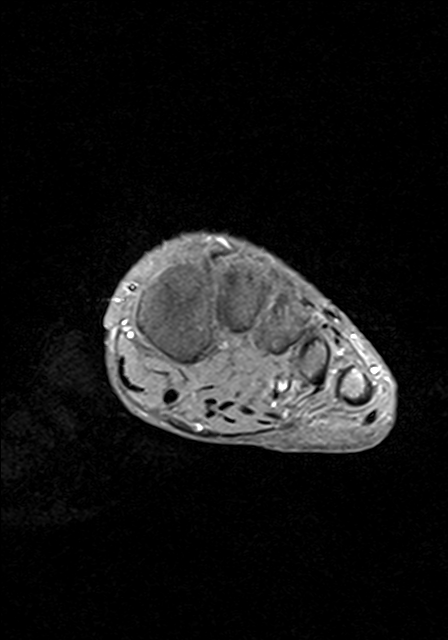
[im 36/48]
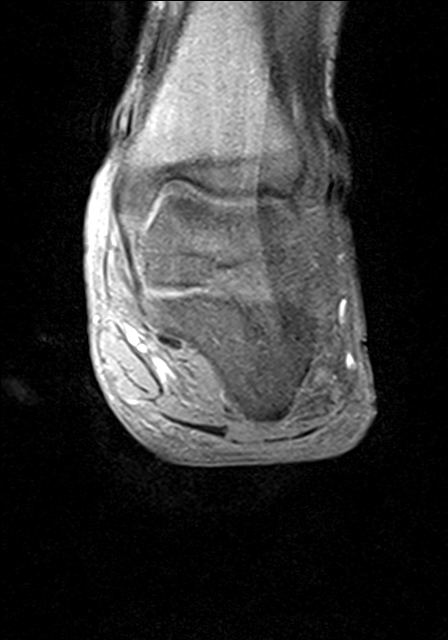
[im 48/48]
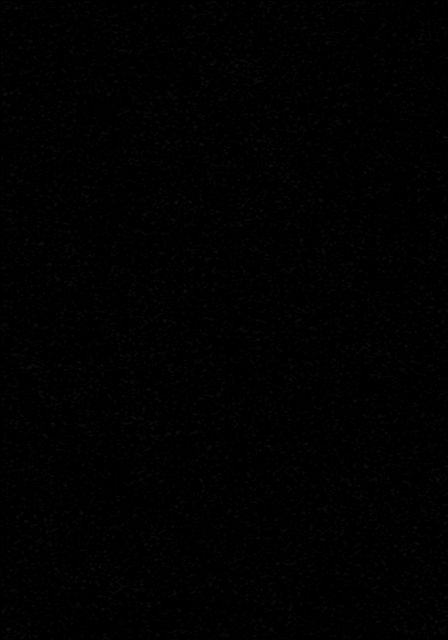

[Series 10: T1 fat-sat · coronal · 4.0mm · 0.25mm/px · 2 of 48 slices shown (2 of 2)]
[im 1/48]
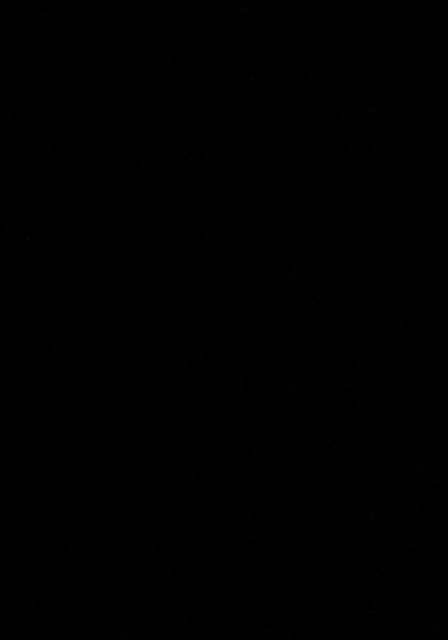
[im 12/48]
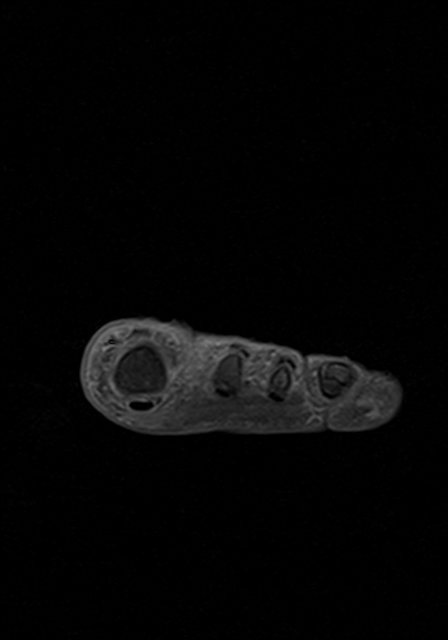

[20 of 40 positions shown; findings below may reference images not displayed]

Osteomyelitis protocol MRI of the foot was obtained, to include the
entire foot and ankle. This protocol uses a large field of view to
cover the entire foot and ankle, and is suitable for assessing bony
structures for osteomyelitis. Due to the large field of view and
imaging plane choice, this protocol is less sensitive for assessing
small structures such as ligamentous structures of the foot and
ankle, compared to a dedicated forefoot or dedicated hindfoot exam.

CONTRAST:  10mL MULTIHANCE GADOBENATE DIMEGLUMINE 529 MG/ML IV SOLN

Creatinine was obtained on site at [HOSPITAL] at [HOSPITAL].

Results: Creatinine 0.9 mg/dL.
FINDINGS: Bones/Joint/Cartilage

Marrow edema and enhancement with decreased T1 signal involving the
distal phalanx, consistent with osteomyelitis. Small first IP joint
effusion. There is cortical irregularity and ill definition with
underlying edema and enhancement of the plantar aspect of the
calcaneus, also consistent with osteomyelitis.

No acute fracture or dislocation. Moderate osteoarthritis of the
first MTP joint.

Ligaments

The medial and lateral ankle ligaments are intact. The Lisfranc
ligament is intact. The toe collateral ligaments are intact.

Muscles and Tendons

Small amount of edema within the flexor hallucis brevis muscle. The
flexor, extensor, and peroneal tendons are unremarkable. No evidence
of tenosynovitis. The Achilles tendon is intact.

Soft tissues

Ulceration at the plantar tip of the great toe. Small 10 mm fluid
collection overlying the tip of the first distal phalanx draining to
the skin surface. Ulceration at the plantar base of the calcaneus
extending to bone.
IMPRESSION: 1. Ulceration at the plantar tip of the great toe with underlying
osteomyelitis of the first distal phalanx. Small 10 mm abscess
overlying the tip of the first distal phalanx draining to the skin
surface.
2. Additional ulceration at the plantar base of the calcaneus
extending to bone, with underlying calcaneal osteomyelitis.

These results will be called to the ordering clinician or
representative by the Radiologist Assistant, and communication
documented in the PACS or zVision Dashboard.

## 2019-12-11 ENCOUNTER — Inpatient Hospital Stay (HOSPITAL_BASED_OUTPATIENT_CLINIC_OR_DEPARTMENT_OTHER)
Admission: EM | Admit: 2019-12-11 | Discharge: 2019-12-13 | DRG: 638 | Disposition: A | Discharge status: Home / Self Care | Payer: BC Managed Care – PPO | Attending: Internal Medicine | Admitting: Internal Medicine

## 2019-12-11 ENCOUNTER — Other Ambulatory Visit: Payer: Self-pay

## 2019-12-11 ENCOUNTER — Encounter (HOSPITAL_BASED_OUTPATIENT_CLINIC_OR_DEPARTMENT_OTHER): Payer: Self-pay

## 2019-12-11 DIAGNOSIS — E1010 Type 1 diabetes mellitus with ketoacidosis without coma: Principal | ICD-10-CM | POA: Diagnosis present

## 2019-12-11 DIAGNOSIS — Z79890 Hormone replacement therapy (postmenopausal): Secondary | ICD-10-CM

## 2019-12-11 DIAGNOSIS — E109 Type 1 diabetes mellitus without complications: Secondary | ICD-10-CM | POA: Diagnosis present

## 2019-12-11 DIAGNOSIS — Z79899 Other long term (current) drug therapy: Secondary | ICD-10-CM

## 2019-12-11 DIAGNOSIS — I10 Essential (primary) hypertension: Secondary | ICD-10-CM | POA: Diagnosis present

## 2019-12-11 DIAGNOSIS — E1110 Type 2 diabetes mellitus with ketoacidosis without coma: Secondary | ICD-10-CM | POA: Diagnosis present

## 2019-12-11 DIAGNOSIS — I129 Hypertensive chronic kidney disease with stage 1 through stage 4 chronic kidney disease, or unspecified chronic kidney disease: Secondary | ICD-10-CM | POA: Diagnosis present

## 2019-12-11 DIAGNOSIS — Z794 Long term (current) use of insulin: Secondary | ICD-10-CM

## 2019-12-11 DIAGNOSIS — Z793 Long term (current) use of hormonal contraceptives: Secondary | ICD-10-CM

## 2019-12-11 DIAGNOSIS — N179 Acute kidney failure, unspecified: Secondary | ICD-10-CM

## 2019-12-11 DIAGNOSIS — E872 Acidosis, unspecified: Secondary | ICD-10-CM

## 2019-12-11 DIAGNOSIS — M1990 Unspecified osteoarthritis, unspecified site: Secondary | ICD-10-CM | POA: Diagnosis present

## 2019-12-11 DIAGNOSIS — E039 Hypothyroidism, unspecified: Secondary | ICD-10-CM | POA: Diagnosis present

## 2019-12-11 DIAGNOSIS — E1042 Type 1 diabetes mellitus with diabetic polyneuropathy: Secondary | ICD-10-CM | POA: Diagnosis present

## 2019-12-11 DIAGNOSIS — E1310 Other specified diabetes mellitus with ketoacidosis without coma: Secondary | ICD-10-CM

## 2019-12-11 DIAGNOSIS — Z20822 Contact with and (suspected) exposure to covid-19: Secondary | ICD-10-CM | POA: Diagnosis present

## 2019-12-11 DIAGNOSIS — E1022 Type 1 diabetes mellitus with diabetic chronic kidney disease: Secondary | ICD-10-CM | POA: Diagnosis present

## 2019-12-11 DIAGNOSIS — N1832 Chronic kidney disease, stage 3b: Secondary | ICD-10-CM | POA: Diagnosis present

## 2019-12-11 DIAGNOSIS — Z9181 History of falling: Secondary | ICD-10-CM

## 2019-12-11 DIAGNOSIS — E785 Hyperlipidemia, unspecified: Secondary | ICD-10-CM | POA: Diagnosis present

## 2019-12-11 DIAGNOSIS — R112 Nausea with vomiting, unspecified: Secondary | ICD-10-CM | POA: Diagnosis not present

## 2019-12-11 LAB — COMPREHENSIVE METABOLIC PANEL
ALT: 11 U/L (ref 0–44)
AST: 16 U/L (ref 15–41)
Albumin: 3.1 g/dL — ABNORMAL LOW (ref 3.5–5.0)
Alkaline Phosphatase: 132 U/L — ABNORMAL HIGH (ref 38–126)
Anion gap: 22 — ABNORMAL HIGH (ref 5–15)
BUN: 26 mg/dL — ABNORMAL HIGH (ref 6–20)
CO2: 12 mmol/L — ABNORMAL LOW (ref 22–32)
Calcium: 8.9 mg/dL (ref 8.9–10.3)
Chloride: 105 mmol/L (ref 98–111)
Creatinine, Ser: 1.71 mg/dL — ABNORMAL HIGH (ref 0.44–1.00)
GFR, Estimated: 34 mL/min — ABNORMAL LOW (ref >60)
Glucose, Bld: 323 mg/dL — ABNORMAL HIGH (ref 70–99)
Potassium: 3.8 mmol/L (ref 3.5–5.1)
Sodium: 139 mmol/L (ref 135–145)
Total Bilirubin: 1.7 mg/dL — ABNORMAL HIGH (ref 0.3–1.2)
Total Protein: 7 g/dL (ref 6.5–8.1)

## 2019-12-11 LAB — CBC WITH DIFFERENTIAL/PLATELET
Abs Immature Granulocytes: 0.44 10*3/uL — ABNORMAL HIGH (ref 0.00–0.07)
Basophils Absolute: 0.1 10*3/uL (ref 0.0–0.1)
Basophils Relative: 1 %
Eosinophils Absolute: 0 10*3/uL (ref 0.0–0.5)
Eosinophils Relative: 0 %
HCT: 35.1 % — ABNORMAL LOW (ref 36.0–46.0)
Hemoglobin: 11.1 g/dL — ABNORMAL LOW (ref 12.0–15.0)
Immature Granulocytes: 3 %
Lymphocytes Relative: 6 %
Lymphs Abs: 0.8 10*3/uL (ref 0.7–4.0)
MCH: 30.7 pg (ref 26.0–34.0)
MCHC: 31.6 g/dL (ref 30.0–36.0)
MCV: 97.2 fL (ref 80.0–100.0)
Monocytes Absolute: 1.1 10*3/uL — ABNORMAL HIGH (ref 0.1–1.0)
Monocytes Relative: 8 %
Neutro Abs: 11.1 10*3/uL — ABNORMAL HIGH (ref 1.7–7.7)
Neutrophils Relative %: 82 %
Platelets: 549 10*3/uL — ABNORMAL HIGH (ref 150–400)
RBC: 3.61 MIL/uL — ABNORMAL LOW (ref 3.87–5.11)
RDW: 13.9 % (ref 11.5–15.5)
WBC: 13.5 10*3/uL — ABNORMAL HIGH (ref 4.0–10.5)
nRBC: 0 % (ref 0.0–0.2)

## 2019-12-11 LAB — I-STAT VENOUS BLOOD GAS, ED
Acid-base deficit: 7 mmol/L — ABNORMAL HIGH (ref 0.0–2.0)
Bicarbonate: 18 mmol/L — ABNORMAL LOW (ref 20.0–28.0)
Calcium, Ion: 1.25 mmol/L (ref 1.15–1.40)
HCT: 31 % — ABNORMAL LOW (ref 36.0–46.0)
Hemoglobin: 10.5 g/dL — ABNORMAL LOW (ref 12.0–15.0)
O2 Saturation: 84 %
Patient temperature: 98.5
Potassium: 4.2 mmol/L (ref 3.5–5.1)
Sodium: 141 mmol/L (ref 135–145)
TCO2: 19 mmol/L — ABNORMAL LOW (ref 22–32)
pCO2, Ven: 32.4 mmHg — ABNORMAL LOW (ref 44.0–60.0)
pH, Ven: 7.354 (ref 7.250–7.430)
pO2, Ven: 50 mmHg — ABNORMAL HIGH (ref 32.0–45.0)

## 2019-12-11 LAB — URINALYSIS, ROUTINE W REFLEX MICROSCOPIC
Glucose, UA: >=500 mg/dL — AB
Ketones, ur: >80 mg/dL — AB
Leukocytes,Ua: NEGATIVE
Nitrite: NEGATIVE
Protein, ur: 100 mg/dL — AB
Specific Gravity, Urine: >1.03 — ABNORMAL HIGH (ref 1.005–1.030)
pH: 6 (ref 5.0–8.0)

## 2019-12-11 LAB — RESPIRATORY PANEL BY RT PCR (FLU A&B, COVID)
Influenza A by PCR: NEGATIVE
Influenza B by PCR: NEGATIVE
SARS Coronavirus 2 by RT PCR: NEGATIVE

## 2019-12-11 LAB — URINALYSIS, MICROSCOPIC (REFLEX)

## 2019-12-11 LAB — LIPASE, BLOOD: Lipase: 38 U/L (ref 11–51)

## 2019-12-11 MED ORDER — DEXTROSE 50 % IV SOLN: 0.0000 mL | INTRAVENOUS | Status: DC | PRN

## 2019-12-11 MED ORDER — INSULIN REGULAR(HUMAN) IN NACL 100-0.9 UT/100ML-% IV SOLN
INTRAVENOUS | Status: AC
  Administered 2019-12-12: 5 [IU]/h via INTRAVENOUS
  Filled 2019-12-11: qty 100

## 2019-12-11 MED ORDER — LACTATED RINGERS IV BOLUS
1000.0000 mL | Freq: Once | INTRAVENOUS | Status: AC
  Administered 2019-12-11: 1000 mL via INTRAVENOUS

## 2019-12-11 MED ORDER — LACTATED RINGERS IV BOLUS
500.0000 mL | Freq: Once | INTRAVENOUS | Status: AC
  Administered 2019-12-11: 500 mL via INTRAVENOUS

## 2019-12-11 MED ORDER — INSULIN REGULAR(HUMAN) IN NACL 100-0.9 UT/100ML-% IV SOLN: INTRAVENOUS | Status: DC

## 2019-12-11 MED ORDER — LORAZEPAM 2 MG/ML IJ SOLN
1.0000 mg | Freq: Once | INTRAMUSCULAR | Status: AC
  Administered 2019-12-11: 1 mg via INTRAVENOUS
  Filled 2019-12-11: qty 1

## 2019-12-11 MED ORDER — METOCLOPRAMIDE HCL 5 MG/ML IJ SOLN
10.0000 mg | Freq: Once | INTRAMUSCULAR | Status: AC
  Administered 2019-12-11: 10 mg via INTRAVENOUS
  Filled 2019-12-11: qty 2

## 2019-12-11 MED ORDER — DEXTROSE IN LACTATED RINGERS 5 % IV SOLN: INTRAVENOUS | Status: DC

## 2019-12-11 MED ORDER — LACTATED RINGERS IV SOLN: INTRAVENOUS | Status: DC

## 2019-12-11 MED ORDER — POTASSIUM CHLORIDE 10 MEQ/100ML IV SOLN
10.0000 meq | INTRAVENOUS | Status: AC
  Administered 2019-12-11 – 2019-12-12 (×2): 10 meq via INTRAVENOUS
  Filled 2019-12-11 (×2): qty 100

## 2019-12-11 NOTE — ED Triage Notes (Addendum)
Pt c/o n/v and "feeling dehydrated"-pt states she was seen by local ortho today (given rx zofran and rx tramadol)  for right UE fx/wearing sling that occurred out of town last week-states she has been taking oxycodone for pain-NAD-to triage in w/c

## 2019-12-11 NOTE — ED Provider Notes (Signed)
[facility] EMERGENCY DEPARTMENT Provider Note   CSN: [id number] Arrival date & time: 12/11/19  1920     History Chief Complaint  Patient presents with  . Emesis    [name] [name] is a 54 y.o. female.  Patient is a 54 year old female with a history of type 1 diabetes, CKD, hyperlipidemia, hypothyroidism who presents today with ongoing nausea and vomiting. Patient reports for the last 2 days she is had persistent vomiting and unable to keep anything down. She denies fever, congestion, cough or shortness of breath. She has mild soreness in her abdomen from vomiting but no localized pain. She does report that her urine smells funny but denies any frequency or urgency. Patient did note that her blood sugar was elevated today at 300 but she is continue to use her subcutaneous insulin. She did recently fracture her arm and had been on pain medication and she was not sure if that is what was making her vomit. However her last dose of pain medicine was at 8 AM. Husband denies any confusion and patient has had no other medication changes.  The history is provided by the patient.  Emesis      Past Medical History:  Diagnosis Date  . Arthritis   . Diabetes mellitus without complication (HCC)   . Diabetic polyneuropathy (HCC)   . Diabetic polyneuropathy [facility])     Patient Active Problem List   Diagnosis Date Noted  . Ulcer of left heel and midfoot with fat layer exposed (HCC) 10/12/2016  . Acute osteomyelitis of toe of right foot (HCC) 08/04/2016  . Acute sinusitis 08/04/2016  . Cellulitis of foot 08/04/2016  . Cervical high risk HPV (human papillomavirus) test positive 08/04/2016  . Charcot's joint of left ankle 08/04/2016  . Conjunctivitis 08/04/2016  . Herniated lumbar intervertebral disc 08/04/2016  . Low back pain 08/04/2016  . Neuropathic pain of both legs 07/29/2016  . MSSA (methicillin susceptible Staphylococcus aureus) 07/21/2016  . Diabetic ulcer of left heel  associated with type 1 diabetes mellitus (HCC) 03/15/2016  . Diabetes mellitus type I (HCC) 03/15/2016  . Closed nondisplaced fracture of right clavicle 02/09/2016  . Diabetes mellitus type 2 in nonobese (HCC) 12/24/2015  . Localized edema 12/24/2015  . Microalbuminuria due to type 1 diabetes mellitus (HCC) 08/12/2015  . Bloating 06/18/2015  . Weight gain 06/18/2015  . Bulging lumbar disc 06/17/2015  . Cervical high risk human papillomavirus (HPV) DNA test positive 06/17/2015  . CKD stage 3 due to type 1 diabetes mellitus (HCC) 06/17/2015  . Hyperlipidemia 06/17/2015  . Hypothyroidism 06/17/2015  . Diabetic neuropathy (HCC) 06/17/2015  . Oral contraceptive pill surveillance 06/10/2015  . Hypoglycemia due to type 1 diabetes mellitus (HCC) 05/31/2015  . Acquired hypothyroidism 04/08/2015  . Anemia 04/08/2015  . Serum albumin decreased 04/08/2015  . Status post amputation of lesser toe (HCC) 04/08/2015  . Acute osteomyelitis of right foot (HCC) 03/27/2015  . Cellulitis and abscess of foot 03/27/2015  . Essential hypertension 03/27/2015  . Osteomyelitis of ankle or foot 03/27/2015  . Type 1 diabetes mellitus with hyperglycemia (HCC) 03/27/2015  . Charcot's arthropathy 01/08/2015  . Closed avulsion fracture of left calcaneus 01/08/2015    Past Surgical History:  Procedure Laterality Date  . CESAREAN SECTION       OB History   No obstetric history on file.     No family history on file.  Social History   Tobacco Use  . Smoking status: Never Smoker  . Smokeless tobacco:  Never Used  Substance Use Topics  . Alcohol use: Yes    Comment: occ  . Drug use: Never    Home Medications Prior to Admission medications   Medication Sig Start Date End Date Taking? Authorizing Provider  cephALEXin (KEFLEX) 500 MG capsule Take 1 capsule (500 mg total) by mouth 3 (three) times daily. 03/26/18   [name], DPM  estradiol-norethindrone (ACTIVELLA) 1-0.5 MG tablet Take by mouth.  07/20/18   [provider]  furosemide (LASIX) 20 MG tablet Take 20 mg by mouth. 07/06/15   [provider]  gabapentin (NEURONTIN) 300 MG capsule Take 300 mg by mouth.    [provider]  ibuprofen (ADVIL,MOTRIN) 800 MG tablet  12/31/15   [provider]  Insulin Glargine, 1 Unit Dial, (TOUJEO SOLOSTAR) 300 UNIT/ML SOPN Use as directed 04/08/15   [provider]  insulin lispro (HUMALOG KWIKPEN) 100 UNIT/ML KiwkPen INJECT 0.03-0.1 ML (3-10 UNITS TOTAL) UNDER THE SKIN THREE (3) TIMES A DAY BEFORE MEALS. 12/02/15   [provider]  Insulin Pen Needle (B-D ULTRAFINE III SHORT PEN) 31G X 8 MM MISC  03/31/15   [provider]  Levothyroxine Sodium 88 MCG CAPS Take by mouth.    [provider]  lisinopril (ZESTRIL) 5 MG tablet Take by mouth. 07/26/18   [provider]  MIMVEY 1-0.5 MG tablet Take 1 tablet by mouth daily. 07/20/18   [provider]  Norethindrone-Ethinyl Estradiol-Fe Biphas (LO LOESTRIN FE) 1 MG-10 MCG / 10 MCG tablet  07/31/15   [provider]  omeprazole (PRILOSEC) 20 MG capsule TAKE 1 CAPSULE (20 MG TOTAL) BY MOUTH 2 TIMES DAILY 07/25/18   [provider]  ONETOUCH VERIO test strip USE TO CHECK BLOOD SUGARS FOUR TIMES DAILY 12/15/17   [provider]  ramipril (ALTACE) 2.5 MG capsule TAKE 1 CAPSULE (2.5 MG TOTAL) BY MOUTH DAILY. 12/16/17   [provider]  silver nitrate applicators 75-25 % applicator Apply topically 3 (three) times a week. 01/25/17   [name], DPM  sulfamethoxazole-trimethoprim (BACTRIM DS) 800-160 MG tablet Take 1 tablet by mouth 2 (two) times daily. 06/26/18   [name], DPM  Wound Dressings (MEDIHONEY WOUND/BURN DRESSING) GEL Apply to affected are 3 times a week, and cover with sterile dressing. 12/26/17   [name], DPM    Allergies    Patient has no known allergies.  Review of Systems   Review of Systems   Gastrointestinal: Positive for vomiting.  All other systems reviewed and are negative.   Physical Exam Updated Vital Signs BP (!) 141/85   Pulse (!) 105   Temp 99.4 F (37.4 C) (Oral)   Resp 18   Ht 5\' 5"  (1.651 m)   Wt 47.6 kg   SpO2 100%   BMI 17.47 kg/m   Physical Exam Vitals and nursing note reviewed.  Constitutional:      General: She is not in acute distress.    Appearance: She is well-developed and underweight.  HENT:     Head: Normocephalic and atraumatic.     Mouth/Throat:     Mouth: Mucous membranes are dry.  Eyes:     Pupils: Pupils are equal, round, and reactive to light.     Funduscopic exam:    Right eye: No papilledema.        Left eye: No papilledema.  Cardiovascular:     Rate and Rhythm: Regular rhythm. Tachycardia present.     Heart sounds: Normal  heart sounds. No murmur heard.  No friction rub.  Pulmonary:     Effort: Pulmonary effort is normal.     Breath sounds: Normal breath sounds. No wheezing or rales.  Abdominal:     General: Bowel sounds are normal. There is no distension.     Palpations: Abdomen is soft.     Tenderness: There is no abdominal tenderness. There is no guarding or rebound.  Musculoskeletal:        General: No tenderness. Normal range of motion.     Cervical back: Normal range of motion and neck supple.     Right lower leg: No edema.     Left lower leg: No edema.     Comments: No edema.  Right upper arm in a sling with healing ecchymosis and swelling seen in the right hand  Lymphadenopathy:     Cervical: No cervical adenopathy.  Skin:    General: Skin is warm and dry.     Findings: No rash.  Neurological:     General: No focal deficit present.     Mental Status: She is alert and oriented to person, place, and time. Mental status is at baseline.     Cranial Nerves: No cranial nerve deficit.     Sensory: No sensory deficit.     Gait: Gait normal.     Comments: photophobia  Psychiatric:        Mood and Affect: Mood  normal.        Behavior: Behavior normal.        Thought Content: Thought content normal.     ED Results / Procedures / Treatments   Labs (all labs ordered are listed, but only abnormal results are displayed) Labs Reviewed  CBC WITH DIFFERENTIAL/PLATELET - Abnormal; Notable for the following components:      Result Value   WBC 13.5 (*)    RBC 3.61 (*)    Hemoglobin 11.1 (*)    HCT 35.1 (*)    Platelets 549 (*)    Neutro Abs 11.1 (*)    Monocytes Absolute 1.1 (*)    Abs Immature Granulocytes 0.44 (*)    All other components within normal limits  COMPREHENSIVE METABOLIC PANEL - Abnormal; Notable for the following components:   CO2 12 (*)    Glucose, Bld 323 (*)    BUN 26 (*)    Creatinine, Ser 1.71 (*)    Albumin 3.1 (*)    Alkaline Phosphatase 132 (*)    Total Bilirubin 1.7 (*)    GFR, Estimated 34 (*)    Anion gap 22 (*)    All other components within normal limits  I-STAT VENOUS BLOOD GAS, ED - Abnormal; Notable for the following components:   pCO2, Ven 32.4 (*)    pO2, Ven 50.0 (*)    Bicarbonate 18.0 (*)    TCO2 19 (*)    Acid-base deficit 7.0 (*)    HCT 31.0 (*)    Hemoglobin 10.5 (*)    All other components within normal limits  LIPASE, BLOOD  URINALYSIS, ROUTINE W REFLEX MICROSCOPIC  BETA-HYDROXYBUTYRIC ACID    EKG None  Radiology No results found.  Procedures Procedures (including critical care time)  Medications Ordered in ED Medications  metoCLOPramide (REGLAN) injection 10 mg (10 mg Intravenous Given 12/11/19 2112)  lactated ringers bolus 1,000 mL (1,000 mLs Intravenous New Bag/Given 12/11/19 2113)  LORazepam (ATIVAN) injection 1 mg (1 mg Intravenous Given 12/11/19 2238)  lactated ringers bolus 500 mL (500 mLs  Intravenous New Bag/Given 12/11/19 2240)    ED Course  I have reviewed the triage vital signs and the nursing notes.  Pertinent labs & imaging results that were available during my care of the patient were reviewed by me and considered  in my medical decision making (see chart for details).    MDM Rules/Calculators/A&P                          Patient is a 54 year old female with a history of type 1 diabetes presenting today with persistent nausea and vomiting. Patient has no focal areas of tenderness on exam and no signs of cellulitis or distal edema. Breath sounds are clear. Temperature is 99 here and patient does complain of change in the smell of her urine. Concern for DKA with patient's recurrent vomiting and increasing blood sugars. VBG does show compensated metabolic acidosis. CBC with leukocytosis of 13,000 and stable hemoglobin, CMP with blood sugar of 323, increased creatinine of 1.71 and anion gap of 22. Lipase is within normal limits. UA is pending. Beta hydroxybutyrate is pending. Patient received 1500 mL and after Reglan was still having nausea so was also given Ativan. Given patient's ongoing symptoms and abnormal labs feel that she will require admission for DKA.  Repeat sugar 249.  Will start endotool and d5 to close her gap.  CRITICAL CARE Performed by: Dianely Krehbiel Total critical care time: 30 minutes Critical care time was exclusive of separately billable procedures and treating other patients. Critical care was necessary to treat or prevent imminent or life-threatening deterioration. Critical care was time spent personally by me on the following activities: development of treatment plan with patient and/or surrogate as well as nursing, discussions with consultants, evaluation of patient's response to treatment, examination of patient, obtaining history from patient or surrogate, ordering and performing treatments and interventions, ordering and review of laboratory studies, ordering and review of radiographic studies, pulse oximetry and re-evaluation of patient's condition.  MDM Number of Diagnoses or Management Options   Amount and/or Complexity of Data Reviewed Clinical lab tests: ordered and  reviewed Tests in the medicine section of CPT: ordered and reviewed Decide to obtain previous medical records or to obtain history from someone other than the patient: yes Obtain history from someone other than the patient: yes Review and summarize past medical records: yes Discuss the patient with other providers: yes Independent visualization of images, tracings, or specimens: yes  Risk of Complications, Morbidity, and/or Mortality Presenting problems: high Diagnostic procedures: moderate Management options: moderate  Patient Progress Patient progress: improved   Final Clinical Impression(s) / ED Diagnoses Final diagnoses:  Diabetic ketoacidosis without coma associated with type 1 diabetes mellitus (HCC)    Rx / DC Orders ED Discharge Orders    None       Gwyneth SproutPlunkett, Cresencia Asmus, MD 12/11/19 2346

## 2019-12-12 ENCOUNTER — Other Ambulatory Visit: Payer: Self-pay

## 2019-12-12 DIAGNOSIS — Z794 Long term (current) use of insulin: Secondary | ICD-10-CM | POA: Diagnosis not present

## 2019-12-12 DIAGNOSIS — Z79890 Hormone replacement therapy (postmenopausal): Secondary | ICD-10-CM | POA: Diagnosis not present

## 2019-12-12 DIAGNOSIS — N1832 Chronic kidney disease, stage 3b: Secondary | ICD-10-CM

## 2019-12-12 DIAGNOSIS — E109 Type 1 diabetes mellitus without complications: Secondary | ICD-10-CM | POA: Diagnosis not present

## 2019-12-12 DIAGNOSIS — N179 Acute kidney failure, unspecified: Secondary | ICD-10-CM | POA: Diagnosis present

## 2019-12-12 DIAGNOSIS — Z9181 History of falling: Secondary | ICD-10-CM | POA: Diagnosis not present

## 2019-12-12 DIAGNOSIS — E1010 Type 1 diabetes mellitus with ketoacidosis without coma: Secondary | ICD-10-CM | POA: Diagnosis present

## 2019-12-12 DIAGNOSIS — Z79899 Other long term (current) drug therapy: Secondary | ICD-10-CM | POA: Diagnosis not present

## 2019-12-12 DIAGNOSIS — E785 Hyperlipidemia, unspecified: Secondary | ICD-10-CM | POA: Diagnosis present

## 2019-12-12 DIAGNOSIS — E039 Hypothyroidism, unspecified: Secondary | ICD-10-CM | POA: Diagnosis present

## 2019-12-12 DIAGNOSIS — I129 Hypertensive chronic kidney disease with stage 1 through stage 4 chronic kidney disease, or unspecified chronic kidney disease: Secondary | ICD-10-CM | POA: Diagnosis present

## 2019-12-12 DIAGNOSIS — E1022 Type 1 diabetes mellitus with diabetic chronic kidney disease: Secondary | ICD-10-CM | POA: Diagnosis present

## 2019-12-12 DIAGNOSIS — Z20822 Contact with and (suspected) exposure to covid-19: Secondary | ICD-10-CM | POA: Diagnosis present

## 2019-12-12 DIAGNOSIS — Z793 Long term (current) use of hormonal contraceptives: Secondary | ICD-10-CM | POA: Diagnosis not present

## 2019-12-12 DIAGNOSIS — M1990 Unspecified osteoarthritis, unspecified site: Secondary | ICD-10-CM | POA: Diagnosis present

## 2019-12-12 DIAGNOSIS — E1042 Type 1 diabetes mellitus with diabetic polyneuropathy: Secondary | ICD-10-CM | POA: Diagnosis present

## 2019-12-12 DIAGNOSIS — E1110 Type 2 diabetes mellitus with ketoacidosis without coma: Secondary | ICD-10-CM | POA: Diagnosis not present

## 2019-12-12 DIAGNOSIS — R112 Nausea with vomiting, unspecified: Secondary | ICD-10-CM | POA: Diagnosis present

## 2019-12-12 DIAGNOSIS — I10 Essential (primary) hypertension: Secondary | ICD-10-CM

## 2019-12-12 LAB — BASIC METABOLIC PANEL
Anion gap: 18 — ABNORMAL HIGH (ref 5–15)
Anion gap: 22 — ABNORMAL HIGH (ref 5–15)
Anion gap: 20 — ABNORMAL HIGH (ref 5–15)
BUN: 23 mg/dL — ABNORMAL HIGH (ref 6–20)
BUN: 23 mg/dL — ABNORMAL HIGH (ref 6–20)
BUN: 21 mg/dL — ABNORMAL HIGH (ref 6–20)
CO2: 13 mmol/L — ABNORMAL LOW (ref 22–32)
CO2: 12 mmol/L — ABNORMAL LOW (ref 22–32)
CO2: 14 mmol/L — ABNORMAL LOW (ref 22–32)
Calcium: 8.3 mg/dL — ABNORMAL LOW (ref 8.9–10.3)
Calcium: 8.6 mg/dL — ABNORMAL LOW (ref 8.9–10.3)
Calcium: 8.7 mg/dL — ABNORMAL LOW (ref 8.9–10.3)
Chloride: 107 mmol/L (ref 98–111)
Chloride: 102 mmol/L (ref 98–111)
Chloride: 104 mmol/L (ref 98–111)
Creatinine, Ser: 1.5 mg/dL — ABNORMAL HIGH (ref 0.44–1.00)
Creatinine, Ser: 1.7 mg/dL — ABNORMAL HIGH (ref 0.44–1.00)
Creatinine, Ser: 1.63 mg/dL — ABNORMAL HIGH (ref 0.44–1.00)
GFR, Estimated: 39 mL/min — ABNORMAL LOW (ref >60)
GFR, Estimated: 34 mL/min — ABNORMAL LOW (ref >60)
GFR, Estimated: 36 mL/min — ABNORMAL LOW (ref >60)
Glucose, Bld: 281 mg/dL — ABNORMAL HIGH (ref 70–99)
Glucose, Bld: 361 mg/dL — ABNORMAL HIGH (ref 70–99)
Glucose, Bld: 201 mg/dL — ABNORMAL HIGH (ref 70–99)
Potassium: 3.8 mmol/L (ref 3.5–5.1)
Potassium: 3.6 mmol/L (ref 3.5–5.1)
Potassium: 3.5 mmol/L (ref 3.5–5.1)
Sodium: 138 mmol/L (ref 135–145)
Sodium: 136 mmol/L (ref 135–145)
Sodium: 138 mmol/L (ref 135–145)

## 2019-12-12 LAB — RENAL FUNCTION PANEL
Albumin: 2.5 g/dL — ABNORMAL LOW (ref 3.5–5.0)
Albumin: 2.4 g/dL — ABNORMAL LOW (ref 3.5–5.0)
Anion gap: 13 (ref 5–15)
Anion gap: 12 (ref 5–15)
BUN: 20 mg/dL (ref 6–20)
BUN: 16 mg/dL (ref 6–20)
CO2: 20 mmol/L — ABNORMAL LOW (ref 22–32)
CO2: 24 mmol/L (ref 22–32)
Calcium: 8.5 mg/dL — ABNORMAL LOW (ref 8.9–10.3)
Calcium: 8.5 mg/dL — ABNORMAL LOW (ref 8.9–10.3)
Chloride: 105 mmol/L (ref 98–111)
Chloride: 102 mmol/L (ref 98–111)
Creatinine, Ser: 1.23 mg/dL — ABNORMAL HIGH (ref 0.44–1.00)
Creatinine, Ser: 1.25 mg/dL — ABNORMAL HIGH (ref 0.44–1.00)
GFR, Estimated: 50 mL/min — ABNORMAL LOW (ref >60)
GFR, Estimated: 49 mL/min — ABNORMAL LOW (ref >60)
Glucose, Bld: 156 mg/dL — ABNORMAL HIGH (ref 70–99)
Glucose, Bld: 139 mg/dL — ABNORMAL HIGH (ref 70–99)
Phosphorus: 1.5 mg/dL — ABNORMAL LOW (ref 2.5–4.6)
Phosphorus: 4.4 mg/dL (ref 2.5–4.6)
Potassium: 3.7 mmol/L (ref 3.5–5.1)
Potassium: 3.5 mmol/L (ref 3.5–5.1)
Sodium: 138 mmol/L (ref 135–145)
Sodium: 138 mmol/L (ref 135–145)

## 2019-12-12 LAB — CBC WITH DIFFERENTIAL/PLATELET
Abs Immature Granulocytes: 0.43 10*3/uL — ABNORMAL HIGH (ref 0.00–0.07)
Basophils Absolute: 0.1 10*3/uL (ref 0.0–0.1)
Basophils Relative: 1 %
Eosinophils Absolute: 0 10*3/uL (ref 0.0–0.5)
Eosinophils Relative: 0 %
HCT: 31.5 % — ABNORMAL LOW (ref 36.0–46.0)
Hemoglobin: 9.9 g/dL — ABNORMAL LOW (ref 12.0–15.0)
Immature Granulocytes: 3 %
Lymphocytes Relative: 8 %
Lymphs Abs: 1.1 10*3/uL (ref 0.7–4.0)
MCH: 31 pg (ref 26.0–34.0)
MCHC: 31.4 g/dL (ref 30.0–36.0)
MCV: 98.7 fL (ref 80.0–100.0)
Monocytes Absolute: 0.9 10*3/uL (ref 0.1–1.0)
Monocytes Relative: 6 %
Neutro Abs: 12 10*3/uL — ABNORMAL HIGH (ref 1.7–7.7)
Neutrophils Relative %: 82 %
Platelets: 444 10*3/uL — ABNORMAL HIGH (ref 150–400)
RBC: 3.19 MIL/uL — ABNORMAL LOW (ref 3.87–5.11)
RDW: 14.4 % (ref 11.5–15.5)
WBC: 14.5 10*3/uL — ABNORMAL HIGH (ref 4.0–10.5)
nRBC: 0 % (ref 0.0–0.2)

## 2019-12-12 LAB — I-STAT VENOUS BLOOD GAS, ED
Acid-base deficit: 8 mmol/L — ABNORMAL HIGH (ref 0.0–2.0)
Bicarbonate: 16.6 mmol/L — ABNORMAL LOW (ref 20.0–28.0)
Calcium, Ion: 1.31 mmol/L (ref 1.15–1.40)
HCT: 27 % — ABNORMAL LOW (ref 36.0–46.0)
Hemoglobin: 9.2 g/dL — ABNORMAL LOW (ref 12.0–15.0)
O2 Saturation: 95 %
Patient temperature: 98.5
Potassium: 3.7 mmol/L (ref 3.5–5.1)
Sodium: 142 mmol/L (ref 135–145)
TCO2: 18 mmol/L — ABNORMAL LOW (ref 22–32)
pCO2, Ven: 30.4 mmHg — ABNORMAL LOW (ref 44.0–60.0)
pH, Ven: 7.345 (ref 7.250–7.430)
pO2, Ven: 79 mmHg — ABNORMAL HIGH (ref 32.0–45.0)

## 2019-12-12 LAB — GLUCOSE, CAPILLARY
Glucose-Capillary: 217 mg/dL — ABNORMAL HIGH (ref 70–99)
Glucose-Capillary: 206 mg/dL — ABNORMAL HIGH (ref 70–99)
Glucose-Capillary: 191 mg/dL — ABNORMAL HIGH (ref 70–99)
Glucose-Capillary: 150 mg/dL — ABNORMAL HIGH (ref 70–99)
Glucose-Capillary: 187 mg/dL — ABNORMAL HIGH (ref 70–99)
Glucose-Capillary: 161 mg/dL — ABNORMAL HIGH (ref 70–99)
Glucose-Capillary: 154 mg/dL — ABNORMAL HIGH (ref 70–99)
Glucose-Capillary: 144 mg/dL — ABNORMAL HIGH (ref 70–99)
Glucose-Capillary: 146 mg/dL — ABNORMAL HIGH (ref 70–99)
Glucose-Capillary: 139 mg/dL — ABNORMAL HIGH (ref 70–99)
Glucose-Capillary: 158 mg/dL — ABNORMAL HIGH (ref 70–99)

## 2019-12-12 LAB — CBG MONITORING, ED
Glucose-Capillary: 145 mg/dL — ABNORMAL HIGH (ref 70–99)
Glucose-Capillary: 165 mg/dL — ABNORMAL HIGH (ref 70–99)
Glucose-Capillary: 272 mg/dL — ABNORMAL HIGH (ref 70–99)
Glucose-Capillary: 323 mg/dL — ABNORMAL HIGH (ref 70–99)
Glucose-Capillary: 343 mg/dL — ABNORMAL HIGH (ref 70–99)
Glucose-Capillary: 378 mg/dL — ABNORMAL HIGH (ref 70–99)
Glucose-Capillary: 428 mg/dL — ABNORMAL HIGH (ref 70–99)
Glucose-Capillary: 440 mg/dL — ABNORMAL HIGH (ref 70–99)

## 2019-12-12 LAB — BETA-HYDROXYBUTYRIC ACID: Beta-Hydroxybutyric Acid: 5.98 mmol/L — ABNORMAL HIGH (ref 0.05–0.27)

## 2019-12-12 LAB — MAGNESIUM: Magnesium: 1.7 mg/dL (ref 1.7–2.4)

## 2019-12-12 LAB — MRSA PCR SCREENING: MRSA by PCR: POSITIVE — AB

## 2019-12-12 LAB — HIV ANTIBODY (ROUTINE TESTING W REFLEX): HIV Screen 4th Generation wRfx: NONREACTIVE

## 2019-12-12 MED ORDER — SODIUM BICARBONATE 8.4 % IV SOLN
INTRAVENOUS | Status: DC
  Filled 2019-12-12: qty 850

## 2019-12-12 MED ORDER — INSULIN ASPART 100 UNIT/ML ~~LOC~~ SOLN
0.0000 [IU] | Freq: Three times a day (TID) | SUBCUTANEOUS | Status: DC
  Administered 2019-12-12: 1 [IU] via SUBCUTANEOUS
  Administered 2019-12-13: 2 [IU] via SUBCUTANEOUS

## 2019-12-12 MED ORDER — CHLORHEXIDINE GLUCONATE CLOTH 2 % EX PADS: 6.0000 | MEDICATED_PAD | Freq: Every day | CUTANEOUS | Status: DC

## 2019-12-12 MED ORDER — ONDANSETRON HCL 4 MG/2ML IJ SOLN: 4.0000 mg | Freq: Four times a day (QID) | INTRAMUSCULAR | Status: DC | PRN

## 2019-12-12 MED ORDER — HYDRALAZINE HCL 20 MG/ML IJ SOLN: 10.0000 mg | Freq: Three times a day (TID) | INTRAMUSCULAR | Status: DC | PRN

## 2019-12-12 MED ORDER — GABAPENTIN 300 MG PO CAPS
300.0000 mg | ORAL_CAPSULE | Freq: Two times a day (BID) | ORAL | Status: DC
  Administered 2019-12-12 – 2019-12-13 (×2): 300 mg via ORAL
  Filled 2019-12-12 (×2): qty 1

## 2019-12-12 MED ORDER — SODIUM CHLORIDE 0.9 % IV SOLN
INTRAVENOUS | Status: DC | PRN
  Administered 2019-12-12: 250 mL via INTRAVENOUS

## 2019-12-12 MED ORDER — LEVOTHYROXINE SODIUM 88 MCG PO TABS
88.0000 ug | ORAL_TABLET | Freq: Every day | ORAL | Status: DC
  Administered 2019-12-12 – 2019-12-13 (×2): 88 ug via ORAL
  Filled 2019-12-12 (×2): qty 1

## 2019-12-12 MED ORDER — ENOXAPARIN SODIUM 40 MG/0.4ML ~~LOC~~ SOLN
40.0000 mg | SUBCUTANEOUS | Status: DC
  Administered 2019-12-12: 40 mg via SUBCUTANEOUS
  Filled 2019-12-12: qty 0.4

## 2019-12-12 MED ORDER — CHLORHEXIDINE GLUCONATE CLOTH 2 % EX PADS
6.0000 | MEDICATED_PAD | Freq: Every day | CUTANEOUS | Status: DC
  Administered 2019-12-12: 6 via TOPICAL

## 2019-12-12 MED ORDER — INSULIN ASPART 100 UNIT/ML ~~LOC~~ SOLN: 0.0000 [IU] | Freq: Every day | SUBCUTANEOUS | Status: DC

## 2019-12-12 MED ORDER — STERILE WATER FOR INJECTION IV SOLN
INTRAVENOUS | Status: DC
  Filled 2019-12-12: qty 850

## 2019-12-12 MED ORDER — PANTOPRAZOLE SODIUM 40 MG PO TBEC
40.0000 mg | DELAYED_RELEASE_TABLET | Freq: Every day | ORAL | Status: DC
  Administered 2019-12-13: 40 mg via ORAL
  Filled 2019-12-12: qty 1

## 2019-12-12 MED ORDER — INSULIN GLARGINE 100 UNIT/ML ~~LOC~~ SOLN
10.0000 [IU] | SUBCUTANEOUS | Status: DC
  Administered 2019-12-12: 10 [IU] via SUBCUTANEOUS
  Filled 2019-12-12: qty 0.1

## 2019-12-12 MED ORDER — POTASSIUM CHLORIDE 10 MEQ/100ML IV SOLN
10.0000 meq | INTRAVENOUS | Status: AC
  Administered 2019-12-12 (×2): 10 meq via INTRAVENOUS
  Filled 2019-12-12 (×2): qty 100

## 2019-12-12 MED ORDER — HYDROCODONE-ACETAMINOPHEN 5-325 MG PO TABS
1.0000 | ORAL_TABLET | Freq: Four times a day (QID) | ORAL | Status: DC | PRN
  Administered 2019-12-12 – 2019-12-13 (×4): 1 via ORAL
  Filled 2019-12-12 (×4): qty 1

## 2019-12-12 MED ORDER — SODIUM BICARBONATE 8.4 % IV SOLN
INTRAVENOUS | Status: AC
  Filled 2019-12-12: qty 150

## 2019-12-12 MED ORDER — SODIUM PHOSPHATES 45 MMOLE/15ML IV SOLN
30.0000 mmol | Freq: Once | INTRAVENOUS | Status: AC
  Administered 2019-12-12: 30 mmol via INTRAVENOUS
  Filled 2019-12-12: qty 10

## 2019-12-12 MED ORDER — MORPHINE SULFATE (PF) 2 MG/ML IV SOLN: 2.0000 mg | INTRAVENOUS | Status: DC | PRN

## 2019-12-12 MED ORDER — MUPIROCIN 2 % EX OINT
1.0000 "application " | TOPICAL_OINTMENT | Freq: Two times a day (BID) | CUTANEOUS | Status: DC
  Administered 2019-12-12 – 2019-12-13 (×3): 1 via NASAL
  Filled 2019-12-12: qty 22

## 2019-12-12 NOTE — Progress Notes (Signed)
Inpatient Diabetes Program Recommendations  AACE/ADA: New Consensus Statement on Inpatient Glycemic Control (2015)  Target Ranges:  Prepandial:   less than 140 mg/dL      Peak postprandial:   less than 180 mg/dL (1-2 hours)      Critically ill patients:  140 - 180 mg/dL   Lab Results  Component Value Date   GLUCAP 187 (H) 12/12/2019   HGBA1C 11.8 (H) 04/20/2017    Review of Glycemic Control  Diabetes history: DM1, CKDIII Outpatient Diabetes medications: Humalog 3-10 units tid, Toujeo Current orders for Inpatient glycemic control: Lantus 10 units Q24H, Novolog 0-9 units tidwc and 0-5 units QHS  HgbA1C - 11.5% - poor glycemic control  Inpatient Diabetes Program Recommendations:     Add meal coverage insulin - 3 units tidwc if eating > 50% meal.  Spoke with pt regarding her HgbA1C of 11.8%. Pt states she sees her Endo every 3-4 months.  Will speak with pt today when feeling better.  Thank you. Ailene Ards, RD, LDN, CDE Inpatient Diabetes Coordinator [phone]

## 2019-12-12 NOTE — ED Notes (Signed)
Insulin infusion stopped per EndoTool glucose 145

## 2019-12-12 NOTE — ED Notes (Signed)
Purewick placed on patient along with fall risk band

## 2019-12-12 NOTE — ED Notes (Signed)
Glucose elevated 440 [employer] with dextrose infusion stopped and insulin infusion started back at 5 units/hour

## 2019-12-12 NOTE — Progress Notes (Signed)
MD made aware of renal function panel results, new orders received to initiate transition off of insulin drip. Will continue to monitor.

## 2019-12-12 NOTE — ED Notes (Addendum)
Pt given water per EDP. 

## 2019-12-12 NOTE — H&P (Addendum)
History and Physical    [name] [name] HMC:[id number] DOB: 28-Nov-1965 DOA: 12/11/2019  PCP: Ronda Fairly, MD  Patient coming from: [facility]  Chief Complaint: N/V  HPI: [name] [name] is a 54 y.o. female with medical history significant of DM2, CKD3b, HTN. Presenting with 2 days of N/V. History from husband. He reports that she was in her normal state of health until about 2 weeks ago. She fell and hurt her should while on a trip in [place]. She was seen by a local doc who gave her pain medicines and told her to follow up with orthopedics. She took the medications through this week. She started having nausea and vomiting 2 days ago, so she stopped those medications. She saw her doctor, who prescribed ultram and some antiemetics. This did not relieve her N/V. As those symptoms continued through yesterday, her husband decided that it was time to come to the ED. She reports no other aggravating or alleviating factors. She reports no other treatments.    ED Course: ED work up revealed pt to be in DKA. TR[facility] was called for admission.   Review of Systems:  Reports decreased appetite. Denies CP, dyspnea, respiratory symptoms, urinary symptoms, diarrhea, fevers, sick contacts. She reports compliance with her sliding scale insulin. Review of systems is otherwise negative for all not mentioned in HPI.   PMHx Past Medical History:  Diagnosis Date  . Arthritis   . Diabetes mellitus without complication (HCC)   . Diabetic polyneuropathy (HCC)   . Diabetic polyneuropathy (HCC)     PSHx Past Surgical History:  Procedure Laterality Date  . CESAREAN SECTION      SocHx  reports that she has never smoked. She has never used smokeless tobacco. She reports current alcohol use. She reports that she does not use drugs.  No Known Allergies  FamHx No family history on file.  Prior to Admission medications   Medication Sig Start Date End Date Taking? Authorizing Provider  cephALEXin (KEFLEX) 500 MG capsule  Take 1 capsule (500 mg total) by mouth 3 (three) times daily. 03/26/18   [name], DPM  estradiol-norethindrone (ACTIVELLA) 1-0.5 MG tablet Take by mouth. 07/20/18   [provider]  furosemide (LASIX) 20 MG tablet Take 20 mg by mouth. 07/06/15   [provider]  gabapentin (NEURONTIN) 300 MG capsule Take 300 mg by mouth.    [provider]  ibuprofen (ADVIL,MOTRIN) 800 MG tablet  12/31/15   [provider]  Insulin Glargine, 1 Unit Dial, (TOUJEO SOLOSTAR) 300 UNIT/ML SOPN Use as directed 04/08/15   [provider]  insulin lispro (HUMALOG KWIKPEN) 100 UNIT/ML KiwkPen INJECT 0.03-0.1 ML (3-10 UNITS TOTAL) UNDER THE SKIN THREE (3) TIMES A DAY BEFORE MEALS. 12/02/15   [provider]  Insulin Pen Needle (B-D ULTRAFINE III SHORT PEN) 31G X 8 MM MISC  03/31/15   [provider]  Levothyroxine Sodium 88 MCG CAPS Take by mouth.    [provider]  lisinopril (ZESTRIL) 5 MG tablet Take by mouth. 07/26/18   [provider]  MIMVEY 1-0.5 MG tablet Take 1 tablet by mouth daily. 07/20/18   [provider]  Norethindrone-Ethinyl Estradiol-Fe Biphas (LO LOESTRIN FE) 1 MG-10 MCG / 10 MCG tablet  07/31/15   [provider]  omeprazole (PRILOSEC) 20 MG capsule TAKE 1 CAPSULE (20 MG TOTAL) BY MOUTH 2 TIMES DAILY 07/25/18   [provider]  ONETOUCH VERIO test strip USE TO CHECK BLOOD SUGARS FOUR TIMES DAILY  12/15/17   [provider]  ramipril (ALTACE) 2.5 MG capsule TAKE 1 CAPSULE (2.5 MG TOTAL) BY MOUTH DAILY. 12/16/17   [provider]  silver nitrate applicators 75-25 % applicator Apply topically 3 (three) times a week. 01/25/17   [name], DPM  sulfamethoxazole-trimethoprim (BACTRIM DS) 800-160 MG tablet Take 1 tablet by mouth 2 (two) times daily. 06/26/18   [name], DPM  Wound Dressings (MEDIHONEY WOUND/BURN DRESSING) GEL Apply to affected are 3 times a week, and cover  with sterile dressing. 12/26/17   [name], DPM    Physical Exam: Vitals:   12/12/19 0230 12/12/19 0345 12/12/19 0415 12/12/19 0808  BP:  123/64 137/82 (!) 165/90  Pulse: 91 99 95 (!) 106  Resp:  16  16  Temp:    98.5 F (36.9 C)  TempSrc:    Oral  SpO2: 98% 97% 97% 100%  Weight:      Height:        General: 54 y.o. female resting in bed in NAD Eyes: PERRL, normal sclera ENMT: Nares patent w/o discharge, orophaynx clear, dentition normal, ears w/o discharge/lesions/ulcers Neck: Supple, trachea midline Cardiovascular: tachy, +S1, S2, no m/g/r, equal pulses throughout Respiratory: CTABL, no w/r/r, normal WOB GI: BS+, NDNT, no masses noted, no organomegaly noted MSK: No e/c/c; shoulder sling noted Skin: No rashes, bruises, ulcerations noted Neuro: A&O x 3, no focal deficits Psyc: Appropriate interaction and affect, calm/cooperative  Labs on Admission: I have personally reviewed following labs and imaging studies  CBC: Recent Labs  Lab 12/11/19 2059 12/11/19 2230 12/12/19 0243  WBC 13.5*  --   --   NEUTROABS 11.1*  --   --   HGB 11.1* 10.5* 9.2*  HCT 35.1* 31.0* 27.0*  MCV 97.2  --   --   PLT 549*  --   --    Basic Metabolic Panel: Recent Labs  Lab 12/11/19 2059 12/11/19 2230 12/12/19 0141 12/12/19 0243  NA 139 141 138 142  K 3.8 4.2 3.8 3.7  CL 105  --  107  --   CO2 12*  --  13*  --   GLUCOSE 323*  --  281*  --   BUN 26*  --  23*  --   CREATININE 1.71*  --  1.50*  --   CALCIUM 8.9  --  8.3*  --    GFR: Estimated Creatinine Clearance: 32.6 mL/min (A) (by C-G formula based on SCr of 1.5 mg/dL (H)). Liver Function Tests: Recent Labs  Lab 12/11/19 2059  AST 16  ALT 11  ALKPHOS 132*  BILITOT 1.7*  PROT 7.0  ALBUMIN 3.1*   Recent Labs  Lab 12/11/19 2059  LIPASE 38   No results for input(s): AMMONIA in the last 168 hours. Coagulation Profile: No results for input(s): INR, PROTIME in the last 168 hours. Cardiac Enzymes: No results for  input(s): CKTOTAL, CKMB, CKMBINDEX, TROPONINI in the last 168 hours. BNP (last 3 results) No results for input(s): PROBNP in the last 8760 hours. HbA1C: No results for input(s): HGBA1C in the last 72 hours. CBG: Recent Labs  Lab 12/12/19 0257 12/12/19 0258 12/12/19 0637 12/12/19 0721 12/12/19 0823  GLUCAP 165* 145* 440* 428* 323*   Lipid Profile: No results for input(s): CHOL, HDL, LDLCALC, TRIG, CHOLHDL, LDLDIRECT in the last 72 hours. Thyroid Function Tests: No results for input(s): TSH, T4TOTAL, FREET4, T3FREE, THYROIDAB in the last 72 hours. Anemia Panel: No results for input(s): VITAMINB12, FOLATE, FERRITIN, TIBC,  IRON, RETICCTPCT in the last 72 hours. Urine analysis:    Component Value Date/Time   COLORURINE YELLOW 12/11/2019 2059   APPEARANCEUR CLOUDY (A) 12/11/2019 2059   LABSPEC >1.030 (H) 12/11/2019 2059   PHURINE 6.0 12/11/2019 2059   GLUCOSEU >=500 (A) 12/11/2019 2059   HGBUR MODERATE (A) 12/11/2019 2059   BILIRUBINUR MODERATE (A) 12/11/2019 2059   KETONESUR >80 (A) 12/11/2019 2059   PROTEINUR 100 (A) 12/11/2019 2059   NITRITE NEGATIVE 12/11/2019 2059   LEUKOCYTESUR NEGATIVE 12/11/2019 2059    Radiological Exams on Admission: No results found.  EKG: Independently reviewed. Sinus tach  Assessment/Plan DKA DM2 N/V     - admit to inpatient, stepdown     - insulin gtt, fluids; endotool     - q4h BMP     - NPO for now     - check A1c     - anti-emetics  HTN     - hold ARB, lasix     - PRN hydralazine  AKI on CKD3b     - SCr baseline is 1.1 - 1.2 per Care Everywhere search     - hold ARB     - watch nephrotoxins     - fluids    Hypothyroidism     - continue home levothyroxine  DVT prophylaxis: lovenox  Code Status: FULL  Family Communication: With husband at bedside  Consults called: None  Admission status: Inpatient  Status is: Inpatient  Remains inpatient appropriate because:Inpatient level of care appropriate due to severity of  illness   Dispo: The patient is from: Home              Anticipated d/c is to: Home              Anticipated d/c date is: 3 days              Patient currently is not medically stable to d/c.  Teddy Spike DO [facility]  If 7PM-7AM, please contact night-coverage [email or web address]  12/12/2019, 9:18 AM

## 2019-12-12 NOTE — ED Provider Notes (Addendum)
Nursing notes and vitals signs, including pulse oximetry, reviewed.  Summary of this visit's results, reviewed by myself:  EKG:  EKG Interpretation  Date/Time:  Wednesday December 11 2019 23:10:09 EDT Ventricular Rate:  100 PR Interval:    QRS Duration: 83 QT Interval:  340 QTC Calculation: 439 R Axis:   62 Text Interpretation: Age not entered, assumed to be  54 years old for purpose of ECG interpretation Sinus tachycardia Nonspecific T abnormalities, lateral leads Baseline wander in lead(s) I aVR aVL V5 No significant change since last tracing Confirmed by Gwyneth Sprout ([id number]) on 12/11/2019 11:22:48 PM       Labs:  Results for orders placed or performed during the hospital encounter of 12/11/19 (from the past 24 hour(s))  CBC with Differential/Platelet     Status: Abnormal   Collection Time: 12/11/19  8:59 PM  Result Value Ref Range   WBC 13.5 (H) 4.0 - 10.5 K/uL   RBC 3.61 (L) 3.87 - 5.11 MIL/uL   Hemoglobin 11.1 (L) 12.0 - 15.0 g/dL   HCT 09.3 (L) 36 - 46 %   MCV 97.2 80.0 - 100.0 fL   MCH 30.7 26.0 - 34.0 pg   MCHC 31.6 30.0 - 36.0 g/dL   RDW 23.5 57.3 - 22.0 %   Platelets 549 (H) 150 - 400 K/uL   nRBC 0.0 0.0 - 0.2 %   Neutrophils Relative % 82 %   Neutro Abs 11.1 (H) 1.7 - 7.7 K/uL   Lymphocytes Relative 6 %   Lymphs Abs 0.8 0.7 - 4.0 K/uL   Monocytes Relative 8 %   Monocytes Absolute 1.1 (H) 0.1 - 1.0 K/uL   Eosinophils Relative 0 %   Eosinophils Absolute 0.0 0.0 - 0.5 K/uL   Basophils Relative 1 %   Basophils Absolute 0.1 0.0 - 0.1 K/uL   Immature Granulocytes 3 %   Abs Immature Granulocytes 0.44 (H) 0.00 - 0.07 K/uL  Comprehensive metabolic panel     Status: Abnormal   Collection Time: 12/11/19  8:59 PM  Result Value Ref Range   Sodium 139 135 - 145 mmol/L   Potassium 3.8 3.5 - 5.1 mmol/L   Chloride 105 98 - 111 mmol/L   CO2 12 (L) 22 - 32 mmol/L   Glucose, Bld 323 (H) 70 - 99 mg/dL   BUN 26 (H) 6 - 20 mg/dL   Creatinine, Ser 2.54 (H) 0.44 - 1.00  mg/dL   Calcium 8.9 8.9 - 27.0 mg/dL   Total Protein 7.0 6.5 - 8.1 g/dL   Albumin 3.1 (L) 3.5 - 5.0 g/dL   AST 16 15 - 41 U/L   ALT 11 0 - 44 U/L   Alkaline Phosphatase 132 (H) 38 - 126 U/L   Total Bilirubin 1.7 (H) 0.3 - 1.2 mg/dL   GFR, Estimated 34 (L) >60 mL/min   Anion gap 22 (H) 5 - 15  Lipase, blood     Status: None   Collection Time: 12/11/19  8:59 PM  Result Value Ref Range   Lipase 38 11 - 51 U/L  Urinalysis, Routine w reflex microscopic Urine, Clean Catch     Status: Abnormal   Collection Time: 12/11/19  8:59 PM  Result Value Ref Range   Color, Urine YELLOW YELLOW   APPearance CLOUDY (A) CLEAR   Specific Gravity, Urine >1.030 (H) 1.005 - 1.030   pH 6.0 5.0 - 8.0   Glucose, UA >=500 (A) NEGATIVE mg/dL   Hgb urine dipstick MODERATE (A) NEGATIVE  Bilirubin Urine MODERATE (A) NEGATIVE   Ketones, ur >80 (A) NEGATIVE mg/dL   Protein, ur 161 (A) NEGATIVE mg/dL   Nitrite NEGATIVE NEGATIVE   Leukocytes,Ua NEGATIVE NEGATIVE  Urinalysis, Microscopic (reflex)     Status: Abnormal   Collection Time: 12/11/19  8:59 PM  Result Value Ref Range   RBC / HPF 6-10 0 - 5 RBC/hpf   WBC, UA 0-5 0 - 5 WBC/hpf   Bacteria, UA FEW (A) NONE SEEN   Squamous Epithelial / LPF 6-10 0 - 5   Budding Yeast PRESENT   I-Stat venous blood gas, [facility] ED)     Status: Abnormal   Collection Time: 12/11/19 10:30 PM  Result Value Ref Range   pH, Ven 7.354 7.25 - 7.43   pCO2, Ven 32.4 (L) 44 - 60 mmHg   pO2, Ven 50.0 (H) 32 - 45 mmHg   Bicarbonate 18.0 (L) 20.0 - 28.0 mmol/L   TCO2 19 (L) 22 - 32 mmol/L   O2 Saturation 84.0 %   Acid-base deficit 7.0 (H) 0.0 - 2.0 mmol/L   Sodium 141 135 - 145 mmol/L   Potassium 4.2 3.5 - 5.1 mmol/L   Calcium, Ion 1.25 1.15 - 1.40 mmol/L   HCT 31.0 (L) 36 - 46 %   Hemoglobin 10.5 (L) 12.0 - 15.0 g/dL   Patient temperature 09.6 F    Sample type VENOUS   CBG monitoring, ED     Status: Abnormal   Collection Time: 12/11/19 11:09 PM  Result Value Ref Range    Glucose-Capillary 272 (H) 70 - 99 mg/dL  Respiratory Panel by RT PCR (Flu A&B, Covid) - Nasopharyngeal Swab     Status: None   Collection Time: 12/11/19 11:11 PM   Specimen: Nasopharyngeal Swab  Result Value Ref Range   SARS Coronavirus 2 by RT PCR NEGATIVE NEGATIVE   Influenza A by PCR NEGATIVE NEGATIVE   Influenza B by PCR NEGATIVE NEGATIVE  CBG monitoring, ED     Status: Abnormal   Collection Time: 12/12/19 12:01 AM  Result Value Ref Range   Glucose-Capillary 378 (H) 70 - 99 mg/dL  CBG monitoring, ED     Status: Abnormal   Collection Time: 12/12/19  1:02 AM  Result Value Ref Range   Glucose-Capillary 343 (H) 70 - 99 mg/dL  Basic metabolic panel     Status: Abnormal   Collection Time: 12/12/19  1:41 AM  Result Value Ref Range   Sodium 138 135 - 145 mmol/L   Potassium 3.8 3.5 - 5.1 mmol/L   Chloride 107 98 - 111 mmol/L   CO2 13 (L) 22 - 32 mmol/L   Glucose, Bld 281 (H) 70 - 99 mg/dL   BUN 23 (H) 6 - 20 mg/dL   Creatinine, Ser 0.45 (H) 0.44 - 1.00 mg/dL   Calcium 8.3 (L) 8.9 - 10.3 mg/dL   GFR, Estimated 39 (L) >60 mL/min   Anion gap 18 (H) 5 - 15  I-Stat venous blood gas, ([facility] ED)     Status: Abnormal   Collection Time: 12/12/19  2:43 AM  Result Value Ref Range   pH, Ven 7.345 7.25 - 7.43   pCO2, Ven 30.4 (L) 44 - 60 mmHg   pO2, Ven 79.0 (H) 32 - 45 mmHg   Bicarbonate 16.6 (L) 20.0 - 28.0 mmol/L   TCO2 18 (L) 22 - 32 mmol/L   O2 Saturation 95.0 %   Acid-base deficit 8.0 (H) 0.0 - 2.0 mmol/L   Sodium 142 135 -  145 mmol/L   Potassium 3.7 3.5 - 5.1 mmol/L   Calcium, Ion 1.31 1.15 - 1.40 mmol/L   HCT 27.0 (L) 36 - 46 %   Hemoglobin 9.2 (L) 12.0 - 15.0 g/dL   Patient temperature 51.7 F    Sample type VENOUS   CBG monitoring, ED     Status: Abnormal   Collection Time: 12/12/19  2:57 AM  Result Value Ref Range   Glucose-Capillary 165 (H) 70 - 99 mg/dL  CBG monitoring, ED     Status: Abnormal   Collection Time: 12/12/19  2:58 AM  Result Value Ref Range   Glucose-Capillary  145 (H) 70 - 99 mg/dL  CBG monitoring, ED     Status: Abnormal   Collection Time: 12/12/19  6:37 AM  Result Value Ref Range   Glucose-Capillary 440 (H) 70 - 99 mg/dL   6:16 AM Patient is somnolent but arousable.  Patient placed on insulin drip per Endo tool without significant change in laboratory studies.  We will get her admitted for compensated diabetic ketoacidosis.  3:12 AM Dr. Loney Loh accepts for admission, requests we start a bicarb drip  CRITICAL CARE Performed by: Carlisle Beers Jamarian Jacinto Total critical care time: 30 minutes Critical care time was exclusive of separately billable procedures and treating other patients. Critical care was necessary to treat or prevent imminent or life-threatening deterioration. Critical care was time spent personally by me on the following activities: development of treatment plan with patient and/or surrogate as well as nursing, discussions with consultants, evaluation of patient's response to treatment, examination of patient, obtaining history from patient or surrogate, ordering and performing treatments and interventions, ordering and review of laboratory studies, ordering and review of radiographic studies, pulse oximetry and re-evaluation of patient's condition.     Dollie Bressi, Jonny Ruiz, MD 12/12/19 0248    Paula Libra, MD 12/12/19 0737    Paula Libra, MD 12/12/19 [phone]

## 2019-12-12 NOTE — Progress Notes (Signed)
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

## 2019-12-13 ENCOUNTER — Encounter: Payer: Self-pay | Admitting: Internal Medicine

## 2019-12-13 ENCOUNTER — Encounter (HOSPITAL_COMMUNITY): Payer: Self-pay | Admitting: Internal Medicine

## 2019-12-13 DIAGNOSIS — E109 Type 1 diabetes mellitus without complications: Secondary | ICD-10-CM | POA: Diagnosis not present

## 2019-12-13 DIAGNOSIS — E1010 Type 1 diabetes mellitus with ketoacidosis without coma: Secondary | ICD-10-CM | POA: Diagnosis not present

## 2019-12-13 DIAGNOSIS — N179 Acute kidney failure, unspecified: Secondary | ICD-10-CM

## 2019-12-13 LAB — RENAL FUNCTION PANEL
Albumin: 2.3 g/dL — ABNORMAL LOW (ref 3.5–5.0)
Anion gap: 11 (ref 5–15)
BUN: 15 mg/dL (ref 6–20)
CO2: 23 mmol/L (ref 22–32)
Calcium: 7.9 mg/dL — ABNORMAL LOW (ref 8.9–10.3)
Chloride: 102 mmol/L (ref 98–111)
Creatinine, Ser: 1.13 mg/dL — ABNORMAL HIGH (ref 0.44–1.00)
GFR, Estimated: 55 mL/min — ABNORMAL LOW (ref >60)
Glucose, Bld: 121 mg/dL — ABNORMAL HIGH (ref 70–99)
Phosphorus: 4.7 mg/dL — ABNORMAL HIGH (ref 2.5–4.6)
Potassium: 3.4 mmol/L — ABNORMAL LOW (ref 3.5–5.1)
Sodium: 136 mmol/L (ref 135–145)

## 2019-12-13 LAB — HEMOGLOBIN A1C
Hgb A1c MFr Bld: 11.5 % — ABNORMAL HIGH (ref 4.8–5.6)
Mean Plasma Glucose: 283 mg/dL

## 2019-12-13 LAB — GLUCOSE, CAPILLARY
Glucose-Capillary: 77 mg/dL (ref 70–99)
Glucose-Capillary: 132 mg/dL — ABNORMAL HIGH (ref 70–99)
Glucose-Capillary: 174 mg/dL — ABNORMAL HIGH (ref 70–99)

## 2019-12-13 LAB — HIV ANTIBODY (ROUTINE TESTING W REFLEX): HIV Screen 4th Generation wRfx: NONREACTIVE

## 2019-12-13 MED ORDER — BD PEN NEEDLE SHORT U/F 31G X 8 MM MISC: 1.0000 | Freq: Three times a day (TID) | 3 refills | Status: AC

## 2019-12-13 MED ORDER — INSULIN GLARGINE 100 UNIT/ML SOLOSTAR PEN: 8.0000 [IU] | PEN_INJECTOR | Freq: Every day | SUBCUTANEOUS | 2 refills | Status: AC

## 2019-12-13 MED ORDER — INSULIN GLARGINE 100 UNIT/ML ~~LOC~~ SOLN
8.0000 [IU] | Freq: Every day | SUBCUTANEOUS | Status: DC
  Administered 2019-12-13: 8 [IU] via SUBCUTANEOUS
  Filled 2019-12-13: qty 0.08

## 2019-12-13 MED ORDER — POTASSIUM CHLORIDE CRYS ER 20 MEQ PO TBCR
40.0000 meq | EXTENDED_RELEASE_TABLET | Freq: Once | ORAL | Status: AC
  Administered 2019-12-13: 40 meq via ORAL
  Filled 2019-12-13: qty 2

## 2019-12-13 NOTE — Discharge Summary (Signed)
Physician Discharge Summary   LYRAH [name] SPQ:[id number] DOB: 23-Dec-1965 DOA: 12/11/2019  PCP: Ronda Fairly, MD  Admit date: 12/11/2019 Discharge date: 12/13/2019  Admitted From: Home Disposition:  Home Admitting physician: Dr. Ronaldo Miyamoto Discharging physician: Lewie Chamber, MD  Recommendations for Outpatient Follow-up:  1. Adjust Lantus as needed 2. Consider referral to endocrine 3. Follow up with ortho  Patient discharged to home in Discharge Condition: stable CODE STATUS: Full Diet recommendation:  Diet Orders (From admission, onward)    Start     Ordered   12/13/19 0000  Diet Carb Modified        12/13/19 1055   12/12/19 1616  Diet Carb Modified Fluid consistency: Thin; Room service appropriate? Yes  Diet effective now       Question Answer Comment  Diet-HS Snack? Nothing   Calorie Level Medium 1600-2000   Fluid consistency: Thin   Room service appropriate? Yes      12/12/19 1616          Hospital Course: [name] [name] is a 54 y.o. female with medical history significant of adult onset Type 1 DM (current A1c 11.5%), CKD3b, HTN. She presented with 2 days of N/V and ongoing right arm pain from a fall 2 weeks prior. Her RUE has been in a sling with outpatient management ongoing with orthopedic surgery.   On workup, she was found to be in DKA and started on IVF and insulin drip. She responded well and was able to be transitioned off the drip.  With further collateral information the following day, she has only been on SSI at home, no basal insulin with glucose levels well over 200 at home.  Etiology of her DKA was considered multifactorial with underlying uncontrolled adult onset T1DM (A1c 11.5%), recent N/V, and ongoing pain from arm fracture (per patient, no radiology records avail).  We discussed started basal insulin with Lantus and she was amenable to this. She was given 10 units after transition off insulin drip with glucose dropping to 77 but asymptomatic.  At  discharge she was started on Lantus 8 units daily with instructions to monitor glucose closely and some adjustment may be needed still to her Lantus dosing. She also plans to follow up with her PCP outpatient and discuss endocrinology referral as well.   She had no significant nausea/vomiting prior to discharge and was tolerating an oral diet well.  Glucose levels were also stable and she was considered stable for discharging home with outpatient follow-up.   No new Assessment & Plan notes have been filed under this hospital service since the last note was generated. Service: Hospitalist   The patient's chronic medical conditions were treated accordingly per the patient's home medication regimen except as noted.  On day of discharge, patient was felt deemed stable for discharge. Patient/family member advised to call PCP or come back to ER if needed.   Principal Diagnosis: DKA, type 1 (HCC)  Discharge Diagnoses: Active Hospital Problems   Diagnosis Date Noted  . Acute renal failure superimposed on stage 3b chronic kidney disease (HCC) 12/13/2019    Priority: Medium  . Diabetes mellitus type I (HCC) 03/15/2016  . Acquired hypothyroidism 04/08/2015  . Essential hypertension 03/27/2015    Resolved Hospital Problems   Diagnosis Date Noted Date Resolved  . DKA, type 1 (HCC) 12/12/2019 12/13/2019    Priority: High    Discharge Instructions    Diet Carb Modified   Complete by: As directed    Increase activity slowly  Complete by: As directed      Allergies as of 12/13/2019      Reactions   Lisinopril Cough      Medication List    STOP taking these medications   cephALEXin 500 MG capsule Commonly known as: KEFLEX   Medihoney Wound/Burn Dressing Gel   silver nitrate applicators 75-25 % applicator   sulfamethoxazole-trimethoprim 800-160 MG tablet Commonly known as: BACTRIM DS     TAKE these medications   acetaminophen 325 MG tablet Commonly known as: TYLENOL Take 650 mg  by mouth every 6 (six) hours as needed for mild pain, fever or headache.   B-D ULTRAFINE III SHORT PEN 31G X 8 MM Misc Generic drug: Insulin Pen Needle Inject 1 each into the skin 4 (four) times daily -  before meals and at bedtime. What changed:   how much to take  how to take this  when to take this   gabapentin 300 MG capsule Commonly known as: NEURONTIN Take 300 mg by mouth 2 (two) times daily.   HumaLOG KwikPen 100 UNIT/ML KiwkPen Generic drug: insulin lispro Inject 3-10 Units into the skin 3 (three) times daily.   ibuprofen 200 MG tablet Commonly known as: ADVIL Take 400 mg by mouth every 6 (six) hours as needed for fever, headache or mild pain.   insulin glargine 100 UNIT/ML Solostar Pen Commonly known as: LANTUS Inject 8 Units into the skin daily. Start taking on: December 14, 2019   levothyroxine 88 MCG tablet Commonly known as: SYNTHROID Take 88 mcg by mouth daily.   omeprazole 20 MG capsule Commonly known as: PRILOSEC Take 20 mg by mouth daily.   ondansetron 4 MG tablet Commonly known as: ZOFRAN Take 4 mg by mouth every 8 (eight) hours as needed for nausea.   OneTouch Verio test strip Generic drug: glucose blood USE TO CHECK BLOOD SUGARS FOUR TIMES DAILY   oxyCODONE 5 MG immediate release tablet Commonly known as: Oxy IR/ROXICODONE Take 5-10 mg by mouth every 4 (four) hours as needed for pain.       Allergies  Allergen Reactions  . Lisinopril Cough    Consultations: none  Discharge Exam: BP (!) 148/78   Pulse 91   Temp 98 F (36.7 C) (Oral)   Resp 10   Ht 5\' 5"  (1.651 m)   Wt 47.6 kg   SpO2 96%   BMI 17.47 kg/m  General appearance: alert, cooperative and no distress Head: Normocephalic, without obvious abnormality, atraumatic Eyes: EOMI Lungs: clear to auscultation bilaterally Heart: regular rate and rhythm and S1, S2 normal Abdomen: normal findings: bowel sounds normal and soft, non-tender Extremities: RUE in arm sling with TTP  in upper humerus no obvious deformity appreciated, no edema Skin: mobility and turgor normal Neurologic: Grossly normal  The results of significant diagnostics from this hospitalization (including imaging, microbiology, ancillary and laboratory) are listed below for reference.   Microbiology: Recent Results (from the past 240 hour(s))  Respiratory Panel by RT PCR (Flu A&B, Covid) - Nasopharyngeal Swab     Status: None   Collection Time: 12/11/19 11:11 PM   Specimen: Nasopharyngeal Swab  Result Value Ref Range Status   SARS Coronavirus 2 by RT PCR NEGATIVE NEGATIVE Final    Comment: (NOTE) SARS-CoV-2 target nucleic acids are NOT DETECTED.  The SARS-CoV-2 RNA is generally detectable in upper respiratoy specimens during the acute phase of infection. The lowest concentration of SARS-CoV-2 viral copies this assay can detect is 131 copies/mL. A negative result does  not preclude SARS-Cov-2 infection and should not be used as the sole basis for treatment or other patient management decisions. A negative result may occur with  improper specimen collection/handling, submission of specimen other than nasopharyngeal swab, presence of viral mutation(s) within the areas targeted by this assay, and inadequate number of viral copies (<131 copies/mL). A negative result must be combined with clinical observations, patient history, and epidemiological information. The expected result is Negative.  Fact Sheet for Patients:  https://www.moore.com/https://www.fda.gov/media/142436/download  Fact Sheet for Healthcare Providers:  https://www.young.biz/https://www.fda.gov/media/142435/download  This test is no t yet approved or cleared by the [place] FDA and  has been authorized for detection and/or diagnosis of SARS-CoV-2 by FDA under an Emergency Use Authorization (EUA). This EUA will remain  in effect (meaning this test can be used) for the duration of the COVID-19 declaration under Section 564(b)(1) of the Act, 21 U.S.C. section  360bbb-3(b)(1), unless the authorization is terminated or revoked sooner.     Influenza A by PCR NEGATIVE NEGATIVE Final   Influenza B by PCR NEGATIVE NEGATIVE Final    Comment: (NOTE) The Xpert Xpress SARS-CoV-2/FLU/RSV assay is intended as an aid in  the diagnosis of influenza from Nasopharyngeal swab specimens and  should not be used as a sole basis for treatment. Nasal washings and  aspirates are unacceptable for Xpert Xpress SARS-CoV-2/FLU/RSV  testing.  Fact Sheet for Patients: https://www.moore.com/https://www.fda.gov/media/142436/download  Fact Sheet for Healthcare Providers: https://www.young.biz/https://www.fda.gov/media/142435/download  This test is not yet approved or cleared by the [place] FDA and  has been authorized for detection and/or diagnosis of SARS-CoV-2 by  FDA under an Emergency Use Authorization (EUA). This EUA will remain  in effect (meaning this test can be used) for the duration of the  Covid-19 declaration under Section 564(b)(1) of the Act, 21  U.S.C. section 360bbb-3(b)(1), unless the authorization is  terminated or revoked. Performed at [facility] [place], [address] [place], [place] [address]   MRSA PCR Screening     Status: Abnormal   Collection Time: 12/12/19  9:14 AM   Specimen: Nasopharyngeal  Result Value Ref Range Status   MRSA by PCR POSITIVE (A) NEGATIVE Final    Comment:        The GeneXpert MRSA Assay (FDA approved for NASAL specimens only), is one component of a comprehensive MRSA colonization surveillance program. It is not intended to diagnose MRSA infection nor to guide or monitor treatment for MRSA infections. RESULT CALLED TO, READ BACK BY AND VERIFIED WITH: FIELDS,O @ 1259 ON [id number] BY POTEAT,S Performed at [facility], [address] [place], [place] [address]      Labs: BNP (last 3 results) No results for input(s): BNP in the last 8760 hours. Basic Metabolic Panel: Recent Labs  Lab 12/12/19 0817 12/12/19 0942  12/12/19 1509 12/12/19 2112 12/13/19 0242  NA 136 138 138 138 136  K 3.6 3.5 3.7 3.5 3.4*  CL 102 104 105 102 102  CO2 12* 14* 20* 24 23  GLUCOSE 361* 201* 156* 139* 121*  BUN 23* 21* 20 16 15   CREATININE 1.70* 1.63* 1.23* 1.25* 1.13*  CALCIUM 8.6* 8.7* 8.5* 8.5* 7.9*  MG  --  1.7  --   --   --   PHOS  --   --  1.5* 4.4 4.7*   Liver Function Tests: Recent Labs  Lab 12/11/19 2059 12/12/19 1509 12/12/19 2112 12/13/19 0242  AST 16  --   --   --   ALT  11  --   --   --   ALKPHOS 132*  --   --   --   BILITOT 1.7*  --   --   --   PROT 7.0  --   --   --   ALBUMIN 3.1* 2.5* 2.4* 2.3*   Recent Labs  Lab 12/11/19 2059  LIPASE 38   No results for input(s): AMMONIA in the last 168 hours. CBC: Recent Labs  Lab 12/11/19 2059 12/11/19 2230 12/12/19 0243 12/12/19 0942  WBC 13.5*  --   --  14.5*  NEUTROABS 11.1*  --   --  12.0*  HGB 11.1* 10.5* 9.2* 9.9*  HCT 35.1* 31.0* 27.0* 31.5*  MCV 97.2  --   --  98.7  PLT 549*  --   --  444*   Cardiac Enzymes: No results for input(s): CKTOTAL, CKMB, CKMBINDEX, TROPONINI in the last 168 hours. BNP: Invalid input(s): POCBNP CBG: Recent Labs  Lab 12/12/19 1846 12/12/19 2143 12/13/19 0736 12/13/19 1011 12/13/19 1145  GLUCAP 139* 158* 77 132* 174*   D-Dimer No results for input(s): DDIMER in the last 72 hours. Hgb A1c Recent Labs    12/12/19 1509  HGBA1C 11.5*   Lipid Profile No results for input(s): CHOL, HDL, LDLCALC, TRIG, CHOLHDL, LDLDIRECT in the last 72 hours. Thyroid function studies No results for input(s): TSH, T4TOTAL, T3FREE, THYROIDAB in the last 72 hours.  Invalid input(s): FREET3 Anemia work up No results for input(s): VITAMINB12, FOLATE, FERRITIN, TIBC, IRON, RETICCTPCT in the last 72 hours. Urinalysis    Component Value Date/Time   COLORURINE YELLOW 12/11/2019 2059   APPEARANCEUR CLOUDY (A) 12/11/2019 2059   LABSPEC >1.030 (H) 12/11/2019 2059   PHURINE 6.0 12/11/2019 2059   GLUCOSEU >=500 (A)  12/11/2019 2059   HGBUR MODERATE (A) 12/11/2019 2059   BILIRUBINUR MODERATE (A) 12/11/2019 2059   KETONESUR >80 (A) 12/11/2019 2059   PROTEINUR 100 (A) 12/11/2019 2059   NITRITE NEGATIVE 12/11/2019 2059   LEUKOCYTESUR NEGATIVE 12/11/2019 2059   Sepsis Labs Invalid input(s): PROCALCITONIN,  WBC,  LACTICIDVEN Microbiology Recent Results (from the past 240 hour(s))  Respiratory Panel by RT PCR (Flu A&B, Covid) - Nasopharyngeal Swab     Status: None   Collection Time: 12/11/19 11:11 PM   Specimen: Nasopharyngeal Swab  Result Value Ref Range Status   SARS Coronavirus 2 by RT PCR NEGATIVE NEGATIVE Final    Comment: (NOTE) SARS-CoV-2 target nucleic acids are NOT DETECTED.  The SARS-CoV-2 RNA is generally detectable in upper respiratoy specimens during the acute phase of infection. The lowest concentration of SARS-CoV-2 viral copies this assay can detect is 131 copies/mL. A negative result does not preclude SARS-Cov-2 infection and should not be used as the sole basis for treatment or other patient management decisions. A negative result may occur with  improper specimen collection/handling, submission of specimen other than nasopharyngeal swab, presence of viral mutation(s) within the areas targeted by this assay, and inadequate number of viral copies (<131 copies/mL). A negative result must be combined with clinical observations, patient history, and epidemiological information. The expected result is Negative.  Fact Sheet for Patients:  https://www.moore.com/  Fact Sheet for Healthcare Providers:  https://www.young.biz/  This test is no t yet approved or cleared by the [place] FDA and  has been authorized for detection and/or diagnosis of SARS-CoV-2 by FDA under an Emergency Use Authorization (EUA). This EUA will remain  in effect (meaning this test can be used) for the duration of  the COVID-19 declaration under Section 564(b)(1) of  the Act, 21 U.S.C. section 360bbb-3(b)(1), unless the authorization is terminated or revoked sooner.     Influenza A by PCR NEGATIVE NEGATIVE Final   Influenza B by PCR NEGATIVE NEGATIVE Final    Comment: (NOTE) The Xpert Xpress SARS-CoV-2/FLU/RSV assay is intended as an aid in  the diagnosis of influenza from Nasopharyngeal swab specimens and  should not be used as a sole basis for treatment. Nasal washings and  aspirates are unacceptable for Xpert Xpress SARS-CoV-2/FLU/RSV  testing.  Fact Sheet for Patients: https://www.moore.com/  Fact Sheet for Healthcare Providers: https://www.young.biz/  This test is not yet approved or cleared by the [place] FDA and  has been authorized for detection and/or diagnosis of SARS-CoV-2 by  FDA under an Emergency Use Authorization (EUA). This EUA will remain  in effect (meaning this test can be used) for the duration of the  Covid-19 declaration under Section 564(b)(1) of the Act, 21  U.S.C. section 360bbb-3(b)(1), unless the authorization is  terminated or revoked. Performed at [facility], [address] [place], [place] [address]   MRSA PCR Screening     Status: Abnormal   Collection Time: 12/12/19  9:14 AM   Specimen: Nasopharyngeal  Result Value Ref Range Status   MRSA by PCR POSITIVE (A) NEGATIVE Final    Comment:        The GeneXpert MRSA Assay (FDA approved for NASAL specimens only), is one component of a comprehensive MRSA colonization surveillance program. It is not intended to diagnose MRSA infection nor to guide or monitor treatment for MRSA infections. RESULT CALLED TO, READ BACK BY AND VERIFIED WITH: FIELDS,O @ 1259 ON [id number] BY POTEAT,S Performed at [facility], [address], [place], [place] [address]     Procedures/Studies: No results found.   Time coordinating discharge: Over 30 minutes    Lewie Chamber, MD  [facility] 12/13/2019, 1:56 PM

## 2019-12-13 NOTE — Discharge Instructions (Signed)
If glucose is less than 120 in the mornings then decrease Lantus dose by 2 units. Do not take if glucose less than 90.   If glucose is greater than 200 in the mornings then increase Lantus dose by 2 units.

## 2019-12-13 NOTE — Hospital Course (Addendum)
[name] [name] is a 54 y.o. female with medical history significant of adult onset Type 1 DM (current A1c 11.5%), CKD3b, HTN. She presented with 2 days of N/V and ongoing right arm pain from a fall 2 weeks prior. Her RUE has been in a sling with outpatient management ongoing with orthopedic surgery.   On workup, she was found to be in DKA and started on IVF and insulin drip. She responded well and was able to be transitioned off the drip.  With further collateral information the following day, she has only been on SSI at home, no basal insulin with glucose levels well over 200 at home.  Etiology of her DKA was considered multifactorial with underlying uncontrolled adult onset T1DM (A1c 11.5%), recent N/V, and ongoing pain from arm fracture (per patient, no radiology records avail).  We discussed started basal insulin with Lantus and she was amenable to this. She was given 10 units after transition off insulin drip with glucose dropping to 77 but asymptomatic.  At discharge she was started on Lantus 8 units daily with instructions to monitor glucose closely and some adjustment may be needed still to her Lantus dosing. She also plans to follow up with her PCP outpatient and discuss endocrinology referral as well.   She had no significant nausea/vomiting prior to discharge and was tolerating an oral diet well.  Glucose levels were also stable and she was considered stable for discharging home with outpatient follow-up.

## 2019-12-13 NOTE — Progress Notes (Addendum)
Discharge education provided to patient and patient's husband, Susy Frizzle. All questions answered. Patient's IVs removed. Patient safely transported via wheelchair to main entrance.

## 2019-12-13 NOTE — TOC Initial Note (Signed)
Transition of Care [facility]) - Initial/Assessment Note    Patient Details  Name: [name] [name] MRN: [id number] Date of Birth: 1965/12/04  Transition of Care [facility]) CM/SW Contact:    Golda Acre, RN Phone Number: 12/13/2019, 12:52 PM  Clinical Narrative:                 54 y.o. female with medical history significant of DM2, CKD3b, HTN. Presenting with 2 days of N/V. History from husband. He reports that she was in her normal state of health until about 2 weeks ago. She fell and hurt her should while on a trip in [place]. She was seen by a local doc who gave her pain medicines and told her to follow up with orthopedics. She took the medications through this week. She started having nausea and vomiting 2 days ago, so she stopped those medications. She saw her doctor, who prescribed ultram and some antiemetics. This did not relieve her N/V. As those symptoms continued through yesterday, her husband decided that it was time to come to the ED. She reports no other aggravating or alleviating factors. She reports no other treatments.   Iv d5lr at 125cc/hrs, iv lr at 125cc/hr, ssi insulin. Following for progression and toc needs. Expected Discharge Plan: Home/Self Care Barriers to Discharge: Barriers Unresolved (comment)   Patient Goals and CMS Choice Patient states their goals for this hospitalization and ongoing recovery are:: to go home CMS Medicare.gov Compare Post Acute Care list provided to:: Patient    Expected Discharge Plan and Services Expected Discharge Plan: Home/Self Care   Discharge Planning Services: CM Consult   Living arrangements for the past 2 months: Single Family Home Expected Discharge Date: 12/13/19                                    Prior Living Arrangements/Services Living arrangements for the past 2 months: Single Family Home Lives with:: Spouse Patient language and need for interpreter reviewed:: Yes Do you feel safe going back to the place where you  live?: Yes      Need for Family Participation in Patient Care: Yes (Comment) Care giver support system in place?: Yes (comment)   Criminal Activity/Legal Involvement Pertinent to Current Situation/Hospitalization: No - Comment as needed  Activities of Daily Living Home Assistive Devices/Equipment: CBG Meter ADL Screening (condition at time of admission) Patient's cognitive ability adequate to safely complete daily activities?: Yes Is the patient deaf or have difficulty hearing?: No Does the patient have difficulty seeing, even when wearing glasses/contacts?: No Does the patient have difficulty concentrating, remembering, or making decisions?: No Patient able to express need for assistance with ADLs?: Yes Does the patient have difficulty dressing or bathing?: No Independently performs ADLs?: No Communication: Independent Dressing (OT): Needs assistance Is this a change from baseline?: Change from baseline, expected to last >3 days Grooming: Needs assistance Is this a change from baseline?: Change from baseline, expected to last >3 days Feeding: Needs assistance Is this a change from baseline?: Change from baseline, expected to last >3 days Bathing: Needs assistance Is this a change from baseline?: Change from baseline, expected to last >3 days Toileting: Needs assistance Is this a change from baseline?: Change from baseline, expected to last >3days In/Out Bed: Needs assistance Is this a change from baseline?: Change from baseline, expected to last >3 days Walks in Home: Needs assistance Is this a change from baseline?: Change  from baseline, expected to last >3 days Does the patient have difficulty walking or climbing stairs?: Yes Weakness of Legs: Both Weakness of Arms/Hands: Both  Permission Sought/Granted                  Emotional Assessment Appearance:: Appears stated age Attitude/Demeanor/Rapport: Engaged Affect (typically observed): Calm Orientation: : Oriented to  Place, Oriented to Self, Oriented to  Time, Oriented to Situation Alcohol / Substance Use: Not Applicable Psych Involvement: No (comment)  Admission diagnosis:  Compensated metabolic acidosis [E87.2] Diabetic ketosis (HCC) [E13.10] DKA (diabetic ketoacidosis) (HCC) [E11.10] Patient Active Problem List   Diagnosis Date Noted  . Acute renal failure superimposed on stage 3b chronic kidney disease (HCC) 12/13/2019  . Ulcer of left heel and midfoot with fat layer exposed (HCC) 10/12/2016  . Acute osteomyelitis of toe of right foot (HCC) 08/04/2016  . Acute sinusitis 08/04/2016  . Cellulitis of foot 08/04/2016  . Cervical high risk HPV (human papillomavirus) test positive 08/04/2016  . Charcot's joint of left ankle 08/04/2016  . Conjunctivitis 08/04/2016  . Herniated lumbar intervertebral disc 08/04/2016  . Low back pain 08/04/2016  . Neuropathic pain of both legs 07/29/2016  . MSSA (methicillin susceptible Staphylococcus aureus) 07/21/2016  . Diabetic ulcer of left heel associated with type 1 diabetes mellitus (HCC) 03/15/2016  . Diabetes mellitus type I (HCC) 03/15/2016  . Closed nondisplaced fracture of right clavicle 02/09/2016  . Localized edema 12/24/2015  . Microalbuminuria due to type 1 diabetes mellitus (HCC) 08/12/2015  . Bloating 06/18/2015  . Weight gain 06/18/2015  . Bulging lumbar disc 06/17/2015  . Cervical high risk human papillomavirus (HPV) DNA test positive 06/17/2015  . CKD stage 3 due to type 1 diabetes mellitus (HCC) 06/17/2015  . Hyperlipidemia 06/17/2015  . Hypothyroidism 06/17/2015  . Diabetic neuropathy (HCC) 06/17/2015  . Oral contraceptive pill surveillance 06/10/2015  . Hypoglycemia due to type 1 diabetes mellitus (HCC) 05/31/2015  . Acquired hypothyroidism 04/08/2015  . Anemia 04/08/2015  . Serum albumin decreased 04/08/2015  . Status post amputation of lesser toe (HCC) 04/08/2015  . Acute osteomyelitis of right foot (HCC) 03/27/2015  . Cellulitis and  abscess of foot 03/27/2015  . Essential hypertension 03/27/2015  . Osteomyelitis of ankle or foot 03/27/2015  . Type 1 diabetes mellitus with hyperglycemia (HCC) 03/27/2015  . Charcot's arthropathy 01/08/2015  . Closed avulsion fracture of left calcaneus 01/08/2015   PCP:  Ronda Fairly, MD Pharmacy:   [id number] - [place], [place] - [address] [place] [address] Phone: [phone] Fax: [phone]  [facility] - [place], [place] - [address] [place] [address] Phone: [phone] Fax: [phone]     Social Determinants of Health (SDOH) Interventions    Readmission Risk Interventions No flowsheet data found.

## 2020-05-19 ENCOUNTER — Ambulatory Visit: Payer: Medicare Other | Admitting: Podiatrist

## 2020-06-16 ENCOUNTER — Ambulatory Visit: Payer: Medicare Other | Admitting: Podiatry

## 2020-06-23 ENCOUNTER — Ambulatory Visit: Payer: Medicare Other | Admitting: Podiatry

## 2021-01-12 ENCOUNTER — Encounter (INDEPENDENT_AMBULATORY_CARE_PROVIDER_SITE_OTHER): Payer: Self-pay

## 2021-01-13 ENCOUNTER — Ambulatory Visit (INDEPENDENT_AMBULATORY_CARE_PROVIDER_SITE_OTHER): Payer: 59 | Admitting: Ophthalmology

## 2021-01-13 ENCOUNTER — Encounter (INDEPENDENT_AMBULATORY_CARE_PROVIDER_SITE_OTHER): Payer: Self-pay | Admitting: Ophthalmology

## 2021-01-13 ENCOUNTER — Other Ambulatory Visit: Payer: Self-pay

## 2021-01-13 DIAGNOSIS — E103491 Type 1 diabetes mellitus with severe nonproliferative diabetic retinopathy without macular edema, right eye: Secondary | ICD-10-CM | POA: Diagnosis not present

## 2021-01-13 DIAGNOSIS — E103592 Type 1 diabetes mellitus with proliferative diabetic retinopathy without macular edema, left eye: Secondary | ICD-10-CM | POA: Diagnosis not present

## 2021-01-13 DIAGNOSIS — H2513 Age-related nuclear cataract, bilateral: Secondary | ICD-10-CM

## 2021-01-13 DIAGNOSIS — H2511 Age-related nuclear cataract, right eye: Secondary | ICD-10-CM | POA: Insufficient documentation

## 2021-01-13 DIAGNOSIS — H4312 Vitreous hemorrhage, left eye: Secondary | ICD-10-CM | POA: Diagnosis not present

## 2021-01-13 DIAGNOSIS — E103512 Type 1 diabetes mellitus with proliferative diabetic retinopathy with macular edema, left eye: Secondary | ICD-10-CM | POA: Insufficient documentation

## 2021-01-13 MED ORDER — BEVACIZUMAB 2.5 MG/0.1ML IZ SOSY
2.5000 mg | PREFILLED_SYRINGE | INTRAVITREAL | Status: AC | PRN
  Administered 2021-01-13: 2.5 mg via INTRAVITREAL

## 2021-01-13 NOTE — Assessment & Plan Note (Signed)
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  We will treat with observation post injection antivegF medication to halt neovascular disease progression OS.  I will explained to the patient if the vitreous hemorrhage continues to spontaneously clear, we still might need to deliver peripheral panretinal photocoagulation so as to prevent recurrence from PDR progression in the future as well as being able to halt ongoing antivegF therapy

## 2021-01-13 NOTE — Assessment & Plan Note (Signed)
I agree with Dr. Lyn Records regarding the density of the cataract these are surprisingly advanced for her age.  Nonetheless good acuity is maintained in the right eye and likely in the left eye.  However should vitrectomy be required or contemplated in the left eye to remove the vitreous opacity from vitreous hemorrhage, I would be suggesting counter extraction with intraocular lens placement in the left eye prior to that.  Nonetheless we will delay this decision until we determine if the vitreous hemorrhage is going to clear after injection of antivegF medication OS

## 2021-01-13 NOTE — Progress Notes (Signed)
01/13/2021     CHIEF COMPLAINT Patient presents for  Chief Complaint  Patient presents with   Retina Evaluation      HISTORY OF PRESENT ILLNESS: [name] [name] is a 55 y.o. female who presents to the clinic today for:   HPI     Retina Evaluation   This started 6 days ago.  Duration of 6 days.  Associated Symptoms Floaters.  Context:  distance vision, mid-range vision and near vision.  Response to treatment was no improvement.  I, the attending physician,  performed the HPI with the patient and updated documentation appropriately.        Comments   NP presents for evaluation of a new Vitreous Hemorrhage OS referred from Dr. Dione Booze. History of adult onset type 1 diabetes mellitus, onset 2004  Pt c/o decreased vision in the left eye, mostly central to temporal, started 6 days ago. Pt denies any FOL. Pt denies any eye pain.       Last edited by Edmon Crape, MD on 01/13/2021  9:20 AM.      Referring physician: Ernesto Rutherford, MD [address] [place],  [place] [address]  HISTORICAL INFORMATION:   Selected notes from the MEDICAL RECORD NUMBER    Lab Results  Component Value Date   HGBA1C 11.5 (H) 12/12/2019     CURRENT MEDICATIONS: No current outpatient medications on file. (Ophthalmic Drugs)   No current facility-administered medications for this visit. (Ophthalmic Drugs)   Current Outpatient Medications (Other)  Medication Sig   acetaminophen (TYLENOL) 325 MG tablet Take 650 mg by mouth every 6 (six) hours as needed for mild pain, fever or headache.   gabapentin (NEURONTIN) 300 MG capsule Take 300 mg by mouth 2 (two) times daily.    ibuprofen (ADVIL) 200 MG tablet Take 400 mg by mouth every 6 (six) hours as needed for fever, headache or mild pain.   insulin glargine (LANTUS) 100 UNIT/ML Solostar Pen Inject 8 Units into the skin daily.   insulin lispro (HUMALOG KWIKPEN) 100 UNIT/ML KiwkPen Inject 3-10 Units into the skin 3 (three) times daily.    Insulin  Pen Needle (B-D ULTRAFINE III SHORT PEN) 31G X 8 MM MISC Inject 1 each into the skin 4 (four) times daily -  before meals and at bedtime.   levothyroxine (SYNTHROID) 88 MCG tablet Take 88 mcg by mouth daily.   omeprazole (PRILOSEC) 20 MG capsule Take 20 mg by mouth daily.    ondansetron (ZOFRAN) 4 MG tablet Take 4 mg by mouth every 8 (eight) hours as needed for nausea.    ONETOUCH VERIO test strip USE TO CHECK BLOOD SUGARS FOUR TIMES DAILY   oxyCODONE (OXY IR/ROXICODONE) 5 MG immediate release tablet Take 5-10 mg by mouth every 4 (four) hours as needed for pain.   No current facility-administered medications for this visit. (Other)      REVIEW OF SYSTEMS:    ALLERGIES Allergies  Allergen Reactions   Lisinopril Cough    PAST MEDICAL HISTORY Past Medical History:  Diagnosis Date   Arthritis    Diabetic polyneuropathy (HCC)    Diabetic polyneuropathy (HCC)    Past Surgical History:  Procedure Laterality Date   CESAREAN SECTION      FAMILY HISTORY History reviewed. No pertinent family history.  SOCIAL HISTORY Social History   Tobacco Use   Smoking status: Never   Smokeless tobacco: Never  Substance Use Topics   Alcohol use: Yes    Comment: occ  Drug use: Never         OPHTHALMIC EXAM:  Base Eye Exam     Visual Acuity (ETDRS)       Right Left   Dist [place] 20/20 -1 20/40 -2   Dist ph [place]  20/25 -2         Tonometry (Tonopen, 8:40 AM)       Right Left   Pressure 16 14         Pupils       Pupils Dark Light Shape React APD   Right PERRL 6.5 6 Round Minimal None   Left PERRL 6.5 6 Round Minimal None         Visual Fields (Counting fingers)       Left Right    Full Full         Extraocular Movement       Right Left    Full, Ortho Full, Ortho         Neuro/Psych     Oriented x3: Yes   Mood/Affect: Normal         Dilation     Both eyes: 1.0% Mydriacyl, 2.5% Phenylephrine @ 8:40 AM           Slit Lamp and Fundus Exam      External Exam       Right Left   External Normal Normal         Slit Lamp Exam       Right Left   Lids/Lashes Normal Normal   Conjunctiva/Sclera White and quiet White and quiet   Cornea Clear Clear   Anterior Chamber Deep and quiet Deep and quiet   Iris Round and reactive Round and reactive   Lens 2+ Nuclear sclerosis 2+ Nuclear sclerosis   Anterior Vitreous Normal Normal         Fundus Exam       Right Left   Posterior Vitreous Normal Vitreous hemorrhage 3+   Disc Normal Normal   C/D Ratio 0.4 0.35   Macula Microaneurysms, no clinically significant macular edema, no macular thickening No Details   Vessels  NPDR-Severe            IMAGING AND PROCEDURES  Imaging and Procedures for 01/13/21  OCT, Retina - OU - Both Eyes       Right Eye Quality was good. Scan locations included subfoveal. Central Foveal Thickness: 235. Progression has no prior data. Findings include normal foveal contour.   Left Eye Central Foveal Thickness: 217. Findings include abnormal foveal contour.   Notes No signs of CSME OU, medial opacity OS consistent with vitreous hemorrhage     Color Fundus Photography Optos - OU - Both Eyes       Right Eye Progression has no prior data. Disc findings include normal observations. Macula : microaneurysms.   Left Eye Progression has no prior data. Disc findings include normal observations. Macula : microaneurysms.   Notes OD with severe NPDR.  OS with stigmata of peripheral severe NPDR, no distinct NVE seen yet moderately dense vitreous hemorrhage located over the posterior pole hampers view of the macular nerve     Intravitreal Injection, Pharmacologic Agent - OS - Left Eye       Time Out 01/13/2021. 9:34 AM. Confirmed correct patient, procedure, site, and patient consented.   Anesthesia Topical anesthesia was used. Anesthetic medications included Lidocaine 4%.   Procedure Preparation included 5% betadine to ocular surface,  10% betadine to eyelids, Tobramycin 0.3%. A  30 gauge needle was used.   Injection: 2.5 mg bevacizumab 2.5 MG/0.1ML   Route: Intravitreal, Site: Left Eye   NDC: [phone], Lot: [id number]   Post-op Post injection exam found visual acuity of at least counting fingers. The patient tolerated the procedure well. There were no complications. The patient received written and verbal post procedure care education. Post injection medications were not given.   Notes Injection #1 intravitreal Avastin OS today for control of severe NPDR and PDR OS             ASSESSMENT/PLAN:  Nuclear sclerotic cataract of both eyes I agree with Dr. Lyn Records regarding the density of the cataract these are surprisingly advanced for her age.  Nonetheless good acuity is maintained in the right eye and likely in the left eye.  However should vitrectomy be required or contemplated in the left eye to remove the vitreous opacity from vitreous hemorrhage, I would be suggesting counter extraction with intraocular lens placement in the left eye prior to that.  Nonetheless we will delay this decision until we determine if the vitreous hemorrhage is going to clear after injection of antivegF medication OS  Vitreous hemorrhage of left eye (HCC) Moderately dense central vitreous hemorrhage likely from PDR.  We will treat with observation post injection antivegF medication to halt neovascular disease progression OS.  I will explained to the patient if the vitreous hemorrhage continues to spontaneously clear, we still might need to deliver peripheral panretinal photocoagulation so as to prevent recurrence from PDR progression in the future as well as being able to halt ongoing antivegF therapy     ICD-10-CM   1. Vitreous hemorrhage of left eye (HCC)  H43.12 Color Fundus Photography Optos - OU - Both Eyes    2. 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 Severe nonproliferative diabetic retinopathy of right eye, without macular edema, associated with type 1 diabetes mellitus (HCC)  E10.3491 OCT, Retina - OU - Both Eyes    Color Fundus Photography Optos - OU - Both Eyes    4. Nuclear sclerotic cataract of both eyes  H25.13       1.  OD with severe NPDR, will need to monitor closely  2.  OS with likely PDR hidden by the dense vitreous hemorrhage in the visual axis and over the posterior pole.  Will treat OS for severe NPDR and PDR with intravitreal Avastin so as to allow for current hemorrhage to subside.  3.  I Did explain the patient that if vitreous hemorrhage moves out of the visual axis or dissolves we will not need to intervene surgically.  Patient instructed in head of bed elevated at night as well  Ophthalmic Meds Ordered this visit:  Meds ordered this encounter  Medications   bevacizumab (AVASTIN) SOSY 2.5 mg       Return in about 2 weeks (around 01/27/2021) for DILATE OU, COLOR FP,, possible FFA, R/L.  There are no Patient Instructions on file for this visit.   Explained the diagnoses, plan, and follow up with the patient and they expressed understanding.  Patient expressed understanding of the importance of proper follow up care.  Alford Highland Von Quintanar M.D. Diseases & Surgery of the Retina and Vitreous [facility] 01/13/21     Abbreviations: M myopia (nearsighted); A astigmatism; H hyperopia (farsighted); P presbyopia; Mrx spectacle prescription;  CTL contact lenses; OD right eye; OS left eye; OU both eyes  XT exotropia; ET esotropia; PEK punctate epithelial keratitis; PEE punctate epithelial erosions; DES dry eye syndrome; MGD meibomian gland dysfunction; ATs artificial tears; PFAT's  preservative free artificial tears; NSC nuclear sclerotic cataract; PSC posterior subcapsular cataract; ERM epi-retinal membrane; PVD posterior vitreous detachment; RD retinal detachment; DM diabetes mellitus; DR diabetic retinopathy; NPDR non-proliferative diabetic retinopathy; PDR proliferative diabetic retinopathy; CSME clinically significant macular edema; DME diabetic macular edema; dbh dot blot hemorrhages; CWS cotton wool spot; POAG primary open angle glaucoma; C/D cup-to-disc ratio; HVF humphrey visual field; GVF goldmann visual field; OCT optical coherence tomography; IOP intraocular pressure; BRVO Branch retinal vein occlusion; CRVO central retinal vein occlusion; CRAO central retinal artery occlusion; BRAO branch retinal artery occlusion; RT retinal tear; SB scleral buckle; PPV pars plana vitrectomy; VH Vitreous hemorrhage; PRP panretinal laser photocoagulation; IVK intravitreal kenalog; VMT vitreomacular traction; MH Macular hole;  NVD neovascularization of the disc; NVE neovascularization elsewhere; AREDS age related eye disease study; ARMD age related macular degeneration; POAG primary open angle glaucoma; EBMD epithelial/anterior basement membrane dystrophy; ACIOL anterior chamber intraocular lens; IOL intraocular lens; PCIOL posterior chamber intraocular lens; Phaco/IOL phacoemulsification with intraocular lens placement; PRK photorefractive keratectomy; LASIK laser assisted in situ keratomileusis; HTN hypertension; DM diabetes mellitus; COPD chronic obstructive pulmonary disease

## 2021-01-27 ENCOUNTER — Encounter (INDEPENDENT_AMBULATORY_CARE_PROVIDER_SITE_OTHER): Payer: 59 | Admitting: Ophthalmology

## 2021-01-27 ENCOUNTER — Ambulatory Visit (INDEPENDENT_AMBULATORY_CARE_PROVIDER_SITE_OTHER): Payer: 59 | Admitting: Ophthalmology

## 2021-01-27 ENCOUNTER — Other Ambulatory Visit: Payer: Self-pay

## 2021-01-27 ENCOUNTER — Encounter (INDEPENDENT_AMBULATORY_CARE_PROVIDER_SITE_OTHER): Payer: Self-pay | Admitting: Ophthalmology

## 2021-01-27 DIAGNOSIS — E103592 Type 1 diabetes mellitus with proliferative diabetic retinopathy without macular edema, left eye: Secondary | ICD-10-CM

## 2021-01-27 DIAGNOSIS — H4312 Vitreous hemorrhage, left eye: Secondary | ICD-10-CM

## 2021-01-27 NOTE — Assessment & Plan Note (Signed)
OS, central vitreous hemorrhage now clearing and mostly old yet still large and central visual axis appears to be from peripheral neovascularization elsewhere seen temporally

## 2021-01-27 NOTE — Progress Notes (Signed)
01/27/2021     CHIEF COMPLAINT Patient presents for  Chief Complaint  Patient presents with   Retina Follow Up      HISTORY OF PRESENT ILLNESS: [name] [name] is a 55 y.o. female who presents to the clinic today for:   HPI     Retina Follow Up   Patient presents with  Other.  In left eye.  This started 2 weeks ago.  Severity is mild.  Duration of 2 weeks.  Since onset it is gradually improving.        Comments   2 week fu ou fp and POSSIBLE FFA r/l Pt states, "I think my vision is better but it is still blurry. There only seems to be one floater and the blurriness has cleared significantly but I can still tell." LBS: 150       Last edited by Demetrios Loll, COA on 01/27/2021 11:28 AM.      Referring physician: Ronda Fairly, MD [address],  [place] [address]  HISTORICAL INFORMATION:   Selected notes from the MEDICAL RECORD NUMBER    Lab Results  Component Value Date   HGBA1C 11.5 (H) 12/12/2019     CURRENT MEDICATIONS: No current outpatient medications on file. (Ophthalmic Drugs)   No current facility-administered medications for this visit. (Ophthalmic Drugs)   Current Outpatient Medications (Other)  Medication Sig   acetaminophen (TYLENOL) 325 MG tablet Take 650 mg by mouth every 6 (six) hours as needed for mild pain, fever or headache.   gabapentin (NEURONTIN) 300 MG capsule Take 300 mg by mouth 2 (two) times daily.    ibuprofen (ADVIL) 200 MG tablet Take 400 mg by mouth every 6 (six) hours as needed for fever, headache or mild pain.   insulin glargine (LANTUS) 100 UNIT/ML Solostar Pen Inject 8 Units into the skin daily.   insulin lispro (HUMALOG KWIKPEN) 100 UNIT/ML KiwkPen Inject 3-10 Units into the skin 3 (three) times daily.    Insulin Pen Needle (B-D ULTRAFINE III SHORT PEN) 31G X 8 MM MISC Inject 1 each into the skin 4 (four) times daily -  before meals and at bedtime.   levothyroxine (SYNTHROID) 88 MCG tablet Take 88  mcg by mouth daily.   omeprazole (PRILOSEC) 20 MG capsule Take 20 mg by mouth daily.    ondansetron (ZOFRAN) 4 MG tablet Take 4 mg by mouth every 8 (eight) hours as needed for nausea.    ONETOUCH VERIO test strip USE TO CHECK BLOOD SUGARS FOUR TIMES DAILY   oxyCODONE (OXY IR/ROXICODONE) 5 MG immediate release tablet Take 5-10 mg by mouth every 4 (four) hours as needed for pain.   No current facility-administered medications for this visit. (Other)      REVIEW OF SYSTEMS:    ALLERGIES Allergies  Allergen Reactions   Lisinopril Cough    PAST MEDICAL HISTORY Past Medical History:  Diagnosis Date   Arthritis    Diabetic polyneuropathy (HCC)    Diabetic polyneuropathy (HCC)    Past Surgical History:  Procedure Laterality Date   CESAREAN SECTION      FAMILY HISTORY History reviewed. No pertinent family history.  SOCIAL HISTORY Social History   Tobacco Use   Smoking status: Never   Smokeless tobacco: Never  Substance Use Topics   Alcohol use: Yes    Comment: occ   Drug use: Never         OPHTHALMIC EXAM:  Base Eye Exam  Visual Acuity (ETDRS)       Right Left   Dist [place] 20/20 -1 20/20         Tonometry (Tonopen, 11:31 AM)       Right Left   Pressure 11 09         Pupils       Pupils Shape React APD   Right PERRL Round Brisk None   Left PERRL Round Brisk None         Visual Fields       Left Right    Full Full         Extraocular Movement       Right Left    Full, Ortho Full, Ortho         Neuro/Psych     Oriented x3: Yes   Mood/Affect: Normal         Dilation     Both eyes: 1.0% Mydriacyl, 2.5% Phenylephrine @ 11:31 AM           Slit Lamp and Fundus Exam     External Exam       Right Left   External Normal Normal         Slit Lamp Exam       Right Left   Lids/Lashes Normal Normal   Conjunctiva/Sclera White and quiet White and quiet   Cornea Clear Clear   Anterior Chamber Deep and quiet Deep and  quiet   Iris Round and reactive Round and reactive   Lens 2+ Nuclear sclerosis 2+ Nuclear sclerosis   Anterior Vitreous Normal Normal         Fundus Exam       Right Left   Posterior Vitreous Normal Vitreous hemorrhage 2+   Disc Normal Normal   C/D Ratio 0.4 0.35   Macula Microaneurysms, no clinically significant macular edema, no macular thickening No Details   Vessels  NPDR-Severe            IMAGING AND PROCEDURES  Imaging and Procedures for 01/27/21  OCT, Retina - OU - Both Eyes       Right Eye Quality was good. Scan locations included subfoveal. Central Foveal Thickness: 235. Progression has no prior data. Findings include normal foveal contour.   Left Eye Central Foveal Thickness: 211. Findings include abnormal foveal contour.   Notes No signs of CSME OU, medial opacity OS consistent with vitreous hemorrhage     Color Fundus Photography Optos - OU - Both Eyes       Right Eye Progression has no prior data. Disc findings include normal observations. Macula : microaneurysms.   Left Eye Progression has no prior data. Disc findings include normal observations. Macula : microaneurysms.   Notes OD with severe NPDR.  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 Vitreous hemorrhage of left eye (HCC)  H43.12 OCT, Retina - OU - Both Eyes    Color Fundus Photography Optos -  OU - Both Eyes     2. 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  2.  3.  Ophthalmic Meds Ordered this visit:  No orders of the defined types were placed in this encounter.      Return in about 2 weeks (around 02/10/2021) for dilate, COLOR FP, PRP, OS.  There are no Patient Instructions on file for this visit.   Explained the diagnoses, plan, and follow up with the patient and they expressed understanding.  Patient expressed understanding of the importance of proper follow up care.   Alford Highland Jamoni Broadfoot M.D. Diseases & Surgery of the Retina and Vitreous [facility] 01/27/21     Abbreviations: M myopia (nearsighted); A astigmatism; H hyperopia (farsighted); P presbyopia; Mrx spectacle prescription;  CTL contact lenses; OD right eye; OS left eye; OU both eyes  XT exotropia; ET esotropia; PEK punctate epithelial keratitis; PEE punctate epithelial erosions; DES dry eye syndrome; MGD meibomian gland dysfunction; ATs artificial tears; PFAT's preservative free artificial tears; NSC nuclear sclerotic cataract; PSC posterior subcapsular cataract; ERM epi-retinal membrane; PVD posterior vitreous detachment; RD retinal detachment; DM diabetes mellitus; DR diabetic retinopathy; NPDR non-proliferative diabetic retinopathy; PDR proliferative diabetic retinopathy; CSME clinically significant macular edema; DME diabetic macular edema; dbh dot blot hemorrhages; CWS cotton wool spot; POAG primary open angle glaucoma; C/D cup-to-disc ratio; HVF humphrey visual field; GVF goldmann visual field; OCT optical coherence tomography; IOP intraocular pressure; BRVO Branch retinal vein occlusion; CRVO central retinal vein occlusion; CRAO central retinal artery occlusion; BRAO branch retinal artery occlusion; RT retinal tear; SB scleral buckle; PPV pars plana vitrectomy; VH Vitreous hemorrhage; PRP  panretinal laser photocoagulation; IVK intravitreal kenalog; VMT vitreomacular traction; MH Macular hole;  NVD neovascularization of the disc; NVE neovascularization elsewhere; AREDS age related eye disease study; ARMD age related macular degeneration; POAG primary open angle glaucoma; EBMD epithelial/anterior basement membrane dystrophy; ACIOL anterior chamber intraocular lens; IOL intraocular lens; PCIOL posterior chamber intraocular lens; Phaco/IOL phacoemulsification with intraocular lens placement; PRK photorefractive keratectomy; LASIK laser assisted in situ keratomileusis; HTN hypertension; DM diabetes mellitus; COPD chronic obstructive pulmonary disease

## 2021-01-27 NOTE — Assessment & Plan Note (Signed)
Positive of vitreous hemorrhage now visible NVE temporally, will schedule PRP dilate OS only next

## 2021-02-09 ENCOUNTER — Ambulatory Visit (INDEPENDENT_AMBULATORY_CARE_PROVIDER_SITE_OTHER): Payer: 59 | Admitting: Ophthalmology

## 2021-02-09 ENCOUNTER — Encounter (INDEPENDENT_AMBULATORY_CARE_PROVIDER_SITE_OTHER): Payer: Self-pay | Admitting: Ophthalmology

## 2021-02-09 ENCOUNTER — Other Ambulatory Visit: Payer: Self-pay

## 2021-02-09 DIAGNOSIS — H4312 Vitreous hemorrhage, left eye: Secondary | ICD-10-CM

## 2021-02-09 DIAGNOSIS — E103592 Type 1 diabetes mellitus with proliferative diabetic retinopathy without macular edema, left eye: Secondary | ICD-10-CM

## 2021-02-09 NOTE — Assessment & Plan Note (Addendum)
Type 1 diabetes with severe retinal nonperfusion peripherally and previous vitreous hemorrhage, plan is for PRP peripherally today, delivered inferiorly and nasally today

## 2021-02-09 NOTE — Assessment & Plan Note (Signed)
Acuity improving OS as central vitreous hemorrhage continues to clear. The patient that the dissolution and movement of the hemorrhage tends to be slow motion and weeks to months however it is already improving I do recommend observation.  Surgical intervention would be appropriate if she is unable to tolerate the symptoms any further or do undue impact on her activities of daily living or working

## 2021-02-09 NOTE — Progress Notes (Signed)
02/09/2021     CHIEF COMPLAINT Patient presents for  Chief Complaint  Patient presents with   Diabetic Retinopathy without Macular Edema      HISTORY OF PRESENT ILLNESS: [name] [name] is a 55 y.o. 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 Last edited by Edmon Crape, MD on 02/09/2021  3:11 PM.      Referring physician: Ronda Fairly, MD [address]  [place] [address]  HISTORICAL INFORMATION:   Selected notes from the MEDICAL RECORD NUMBER    Lab Results  Component Value Date   HGBA1C 11.5 (H) 12/12/2019     CURRENT MEDICATIONS: No current outpatient medications on file. (Ophthalmic Drugs)   No current facility-administered medications for this visit. (Ophthalmic Drugs)   Current Outpatient Medications (Other)  Medication Sig   acetaminophen (TYLENOL) 325 MG tablet Take 650 mg by mouth every 6 (six) hours as needed for mild pain, fever or headache.   gabapentin (NEURONTIN) 300 MG capsule Take 300 mg by mouth 2 (two) times daily.    ibuprofen (ADVIL) 200 MG tablet Take 400 mg by mouth every 6 (six) hours as needed for fever, headache or mild pain.   insulin glargine (LANTUS) 100 UNIT/ML Solostar Pen Inject 8 Units into the skin daily.   insulin lispro (HUMALOG KWIKPEN) 100 UNIT/ML KiwkPen Inject 3-10 Units into the skin 3 (three) times daily.    Insulin Pen Needle (B-D ULTRAFINE III SHORT PEN) 31G X 8 MM MISC Inject 1 each into the skin 4 (four) times daily -  before meals and at bedtime.   levothyroxine (SYNTHROID) 88 MCG tablet Take 88 mcg by mouth daily.   omeprazole (PRILOSEC) 20 MG capsule Take 20 mg by mouth daily.    ondansetron (ZOFRAN) 4 MG tablet Take 4 mg by mouth every 8 (eight) hours as needed for nausea.    ONETOUCH VERIO test strip USE TO CHECK BLOOD SUGARS FOUR TIMES DAILY   oxyCODONE (OXY  IR/ROXICODONE) 5 MG immediate release tablet Take 5-10 mg by mouth every 4 (four) hours as needed for pain.   No current facility-administered medications for this visit. (Other)      REVIEW OF SYSTEMS: ROS   Negative for: Constitutional, Gastrointestinal, Neurological, Skin, Genitourinary, Musculoskeletal, HENT, Endocrine, Cardiovascular, Eyes, Respiratory, Psychiatric, Allergic/Imm, Heme/Lymph Last edited by Edmon Crape, MD on 02/09/2021  3:11 PM.       ALLERGIES Allergies  Allergen Reactions   Lisinopril Cough    PAST MEDICAL HISTORY Past Medical History:  Diagnosis Date   Arthritis    Diabetic polyneuropathy (HCC)    Diabetic polyneuropathy (HCC)    Past Surgical History:  Procedure Laterality Date   CESAREAN SECTION      FAMILY HISTORY History reviewed. No pertinent family history.  SOCIAL HISTORY Social History   Tobacco Use   Smoking status: Never   Smokeless tobacco: Never  Substance Use Topics   Alcohol use: Yes    Comment: occ   Drug use: Never         OPHTHALMIC EXAM:  Base Eye Exam     Visual Acuity (ETDRS)       Right Left   Dist [place] 20/15 20/15 -1         Tonometry (Tonopen, 2:55 PM)       Right Left  Pressure 12 14         Pupils       Pupils APD   Right PERRL None   Left PERRL None         Visual Fields       Left Right    Full Full         Neuro/Psych     Oriented x3: Yes   Mood/Affect: Normal         Dilation     Left eye: 1.0% Mydriacyl, 2.5% Phenylephrine @ 2:55 PM           Slit Lamp and Fundus Exam     External Exam       Right Left   External Normal Normal         Slit Lamp Exam       Right Left   Lids/Lashes Normal Normal   Conjunctiva/Sclera White and quiet White and quiet   Cornea Clear Clear   Anterior Chamber Deep and quiet Deep and quiet   Iris Round and reactive Round and reactive, no neovascularization, undilated   Lens 2+ Nuclear sclerosis 2+ Nuclear sclerosis    Anterior Vitreous Normal Normal         Fundus Exam       Right Left   Posterior Vitreous  Vitreous hemorrhage 2+   Disc  Normal   C/D Ratio  0.35   Macula  No Details   Vessels  NPDR-Severe            IMAGING AND PROCEDURES  Imaging and Procedures for 02/09/21  Panretinal Photocoagulation - OS - Left Eye       Time Out Confirmed correct patient, procedure, site, and patient consented.   Anesthesia Topical anesthesia was used. Anesthetic medications included Proparacaine 0.5%.   Laser Information The type of laser was diode. Color was yellow. The duration in seconds was 0.03. The spot size was 390 microns. Laser power was 300. Total spots was 870.   Post-op The patient tolerated the procedure well. There were no complications. The patient received written and verbal post procedure care education.   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 The patient that the dissolution and movement of the hemorrhage tends to be slow motion and weeks to months however it is already improving I do recommend observation.  Surgical intervention would be appropriate if she is unable to tolerate the symptoms any further or do undue impact on her activities of daily living or working     ICD-10-CM   1. 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 Vitreous  hemorrhage of left eye (HCC)  H43.12       1.  OS PRP #1 delivered inferiorly and nasally today. 2.  Once continued on observe the hemorrhage for continued spontaneous clearing  3.  Ophthalmic Meds Ordered this visit:  No orders of the defined types were placed in this  encounter.      Return in about 8 weeks (around 04/06/2021) for DILATE OU, COLOR FP.  There are no Patient Instructions on file for this visit.   Explained the diagnoses, plan, and follow up with the patient and they expressed understanding.  Patient expressed understanding of the importance of proper follow up care.   Alford Highland Janiaya Ryser M.D. Diseases & Surgery of the Retina and Vitreous [facility] 02/09/21     Abbreviations: M myopia (nearsighted); A astigmatism; H hyperopia (farsighted); P presbyopia; Mrx spectacle prescription;  CTL contact lenses; OD right eye; OS left eye; OU both eyes  XT exotropia; ET esotropia; PEK punctate epithelial keratitis; PEE punctate epithelial erosions; DES dry eye syndrome; MGD meibomian gland dysfunction; ATs artificial tears; PFAT's preservative free artificial tears; NSC nuclear sclerotic cataract; PSC posterior subcapsular cataract; ERM epi-retinal membrane; PVD posterior vitreous detachment; RD retinal detachment; DM diabetes mellitus; DR diabetic retinopathy; NPDR non-proliferative diabetic retinopathy; PDR proliferative diabetic retinopathy; CSME clinically significant macular edema; DME diabetic macular edema; dbh dot blot hemorrhages; CWS cotton wool spot; POAG primary open angle glaucoma; C/D cup-to-disc ratio; HVF humphrey visual field; GVF goldmann visual field; OCT optical coherence tomography; IOP intraocular pressure; BRVO Branch retinal vein occlusion; CRVO central retinal vein occlusion; CRAO central retinal artery occlusion; BRAO branch retinal artery occlusion; RT retinal tear; SB scleral buckle; PPV pars plana vitrectomy; VH Vitreous hemorrhage; PRP  panretinal laser photocoagulation; IVK intravitreal kenalog; VMT vitreomacular traction; MH Macular hole;  NVD neovascularization of the disc; NVE neovascularization elsewhere; AREDS age related eye disease study; ARMD age related macular degeneration; POAG primary open angle glaucoma; EBMD epithelial/anterior basement membrane dystrophy; ACIOL anterior chamber intraocular lens; IOL intraocular lens; PCIOL posterior chamber intraocular lens; Phaco/IOL phacoemulsification with intraocular lens placement; PRK photorefractive keratectomy; LASIK laser assisted in situ keratomileusis; HTN hypertension; DM diabetes mellitus; COPD chronic obstructive pulmonary disease

## 2021-04-06 ENCOUNTER — Ambulatory Visit (INDEPENDENT_AMBULATORY_CARE_PROVIDER_SITE_OTHER): Payer: BC Managed Care – PPO | Admitting: Ophthalmology

## 2021-04-06 ENCOUNTER — Other Ambulatory Visit: Payer: Self-pay

## 2021-04-06 ENCOUNTER — Encounter (INDEPENDENT_AMBULATORY_CARE_PROVIDER_SITE_OTHER): Payer: Self-pay | Admitting: Ophthalmology

## 2021-04-06 DIAGNOSIS — H4312 Vitreous hemorrhage, left eye: Secondary | ICD-10-CM

## 2021-04-06 DIAGNOSIS — E103491 Type 1 diabetes mellitus with severe nonproliferative diabetic retinopathy without macular edema, right eye: Secondary | ICD-10-CM | POA: Diagnosis not present

## 2021-04-06 DIAGNOSIS — E103592 Type 1 diabetes mellitus with proliferative diabetic retinopathy without macular edema, left eye: Secondary | ICD-10-CM | POA: Diagnosis not present

## 2021-04-06 MED ORDER — FLUORESCEIN SODIUM 10 % IV SOLN
500.0000 mg | INTRAVENOUS | Status: AC | PRN
  Administered 2021-04-06: 500 mg via INTRAVENOUS

## 2021-04-06 NOTE — Progress Notes (Signed)
04/06/2021     CHIEF COMPLAINT Patient presents for  Chief Complaint  Patient presents with   Retina Follow Up      HISTORY OF PRESENT ILLNESS: [name] [name] is a 56 y.o. female who presents to the clinic today for:   HPI     Retina Follow Up           Diagnosis: Other   Laterality: both eyes   Onset: 8 weeks ago   Severity: mild   Duration: 8 weeks   Course: stable         Comments   8 week dilate OU and FP  Pt states VA OU stable since last visit. Pt denies FOL, floaters, or ocular pain OU.  Pt states, "My floaters that I had seem to have resolved now."        Last edited by Demetrios Loll, COA on 04/06/2021  2:03 PM.      Referring physician: Ronda Fairly, MD [address],  [place] [address]  HISTORICAL INFORMATION:   Selected notes from the MEDICAL RECORD NUMBER    Lab Results  Component Value Date   HGBA1C 11.5 (H) 12/12/2019     CURRENT MEDICATIONS: No current outpatient medications on file. (Ophthalmic Drugs)   No current facility-administered medications for this visit. (Ophthalmic Drugs)   Current Outpatient Medications (Other)  Medication Sig   acetaminophen (TYLENOL) 325 MG tablet Take 650 mg by mouth every 6 (six) hours as needed for mild pain, fever or headache.   gabapentin (NEURONTIN) 300 MG capsule Take 300 mg by mouth 2 (two) times daily.    ibuprofen (ADVIL) 200 MG tablet Take 400 mg by mouth every 6 (six) hours as needed for fever, headache or mild pain.   insulin glargine (LANTUS) 100 UNIT/ML Solostar Pen Inject 8 Units into the skin daily.   insulin lispro (HUMALOG KWIKPEN) 100 UNIT/ML KiwkPen Inject 3-10 Units into the skin 3 (three) times daily.    Insulin Pen Needle (B-D ULTRAFINE III SHORT PEN) 31G X 8 MM MISC Inject 1 each into the skin 4 (four) times daily -  before meals and at bedtime.   levothyroxine (SYNTHROID) 88 MCG tablet Take 88 mcg by mouth daily.   omeprazole (PRILOSEC) 20 MG capsule  Take 20 mg by mouth daily.    ondansetron (ZOFRAN) 4 MG tablet Take 4 mg by mouth every 8 (eight) hours as needed for nausea.    ONETOUCH VERIO test strip USE TO CHECK BLOOD SUGARS FOUR TIMES DAILY   oxyCODONE (OXY IR/ROXICODONE) 5 MG immediate release tablet Take 5-10 mg by mouth every 4 (four) hours as needed for pain.   No current facility-administered medications for this visit. (Other)      REVIEW OF SYSTEMS:    ALLERGIES Allergies  Allergen Reactions   Lisinopril Cough    PAST MEDICAL HISTORY Past Medical History:  Diagnosis Date   Arthritis    Diabetic polyneuropathy (HCC)    Diabetic polyneuropathy (HCC)    Past Surgical History:  Procedure Laterality Date   CESAREAN SECTION      FAMILY HISTORY History reviewed. No pertinent family history.  SOCIAL HISTORY Social History   Tobacco Use   Smoking status: Never   Smokeless tobacco: Never  Substance Use Topics   Alcohol use: Yes    Comment: occ   Drug use: Never         OPHTHALMIC EXAM:  Base Eye Exam  Visual Acuity (ETDRS)       Right Left   Dist [place] 20/20 20/20 -1         Tonometry (Tonopen, 2:05 PM)       Right Left   Pressure 18 19         Pupils       Pupils Shape React APD   Right PERRL Round Brisk None   Left PERRL Round Brisk None         Visual Fields (Counting fingers)       Left Right    Full Full         Extraocular Movement       Right Left    Full, Ortho Full, Ortho         Neuro/Psych     Oriented x3: Yes   Mood/Affect: Normal         Dilation     Both eyes: 1.0% Mydriacyl, 2.5% Phenylephrine @ 2:05 PM           Slit Lamp and Fundus Exam     External Exam       Right Left   External Normal Normal         Slit Lamp Exam       Right Left   Lids/Lashes Normal Normal   Conjunctiva/Sclera White and quiet White and quiet   Cornea Clear Clear   Anterior Chamber Deep and quiet Deep and quiet   Iris Round and reactive Round and  reactive, no neovascularization, undilated   Lens 2+ Nuclear sclerosis 2+ Nuclear sclerosis   Anterior Vitreous Normal Normal         Fundus Exam       Right Left   Posterior Vitreous Normal Vitreous hemorrhage mostly cleared, thin attenuated, old debris inferior   Disc Normal Neovascularization.  Large, greater than 10 cc standard photo, elevated with vitreal papillary traction   C/D Ratio 0.35 0.35   Macula Normal No Details   Vessels NPDR-Severe NPDR-Severe   Periphery Normal Normal            IMAGING AND PROCEDURES  Imaging and Procedures for 04/06/21  OCT, Retina - OU - Both Eyes       Right Eye Quality was good. Scan locations included subfoveal. Central Foveal Thickness: 232. Progression has no prior data. Findings include normal foveal contour.   Left Eye Central Foveal Thickness: 211. Findings include abnormal foveal contour.   Notes No signs of CSME OU, media opacity OS consistent with vitreous hemorrhage, continues to clear     Color Fundus Photography Optos - OU - Both Eyes       Right Eye Progression has no prior data. Disc findings include normal observations. Macula : microaneurysms.   Left Eye Progression has no prior data. Disc findings include normal observations. Macula : microaneurysms.   Notes OD with severe NPDR.  OS with stigmata of peripheral severe NPDR, central vitreous hemorrhage is now cleared OS, tag remnants of NVD on the nerve elevated white fibrous tissue possible early NVE as seen to nerve     Fluorescein Angiography Optos (Transit OS)       Injection: 500 mg Fluorescein Sodium 10 %   Route: Intravenous, Site: Left Arm   NDC: [id number]   Right Eye Mid/Late phase findings include vascular perfusion defect, microaneurysm.   Left Eye   Early phase findings include microaneurysm. Mid/Late phase findings include microaneurysm, vascular perfusion defect.   Notes Stigmata of severe NPDR,  OD, with large regional  areas of retinal nonperfusion anterior in the temporal window.  Will need peripheral PRP in this local region no active NVE OD  OS with remnant of NVD on the nerve, limited PRP noted inferotemporal, will do complete PRP anterior temporally left eye and anterior nearly 360 left eye in order to prevent further progression of PDR symptoms             ASSESSMENT/PLAN:  Vitreous hemorrhage of left eye (HCC) OS continued clearance, reviewed photographically and anatomically with the patient at the viewing screen today.  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 Severe nonproliferative diabetic retinopathy of right eye, without macular edema, associated with type 1 diabetes mellitus (HCC)  B16.9450 Color Fundus Photography Optos - OU - Both Eyes    Fluorescein Angiography Optos (Transit OS)    Fluorescein Sodium 10 % injection 500 mg    3. Vitreous hemorrhage of left eye (HCC)  H43.12       1.  Fluorescein angiography confirms extensive retinal nonperfusion anterior retina classic for type 1 diabetes mellitus, OU.  Will recommend peripheral PRP anterior completion of the left eye in the near future  OD will need peripheral anterior PRP temporally also to prevent the progression of PDR OD  2.  3.  Ophthalmic Meds Ordered this visit:  Meds ordered this  encounter  Medications   Fluorescein Sodium 10 % injection 500 mg       Return in about 1 week (around 04/13/2021) for dilate, OD, PRP    in the am.  There are no Patient Instructions on file for this visit.   Explained the diagnoses, plan, and follow up with the patient and they expressed understanding.  Patient expressed understanding of the importance of proper follow up care.   Alford Highland Jadin Kagel M.D. Diseases & Surgery of the Retina and Vitreous [facility] 04/06/21     Abbreviations: M myopia (nearsighted); A astigmatism; H hyperopia (farsighted); P presbyopia; Mrx spectacle prescription;  CTL contact lenses; OD right eye; OS left eye; OU both eyes  XT exotropia; ET esotropia; PEK punctate epithelial keratitis; PEE punctate epithelial erosions; DES dry eye syndrome; MGD meibomian gland dysfunction; ATs artificial tears; PFAT's preservative free artificial tears; NSC nuclear sclerotic cataract; PSC posterior subcapsular cataract; ERM epi-retinal membrane; PVD posterior vitreous detachment; RD retinal detachment; DM diabetes mellitus; DR diabetic retinopathy; NPDR non-proliferative diabetic retinopathy; PDR proliferative diabetic retinopathy; CSME clinically significant macular edema; DME diabetic macular edema; dbh dot blot hemorrhages; CWS cotton wool spot; POAG primary open angle glaucoma; C/D cup-to-disc ratio; HVF humphrey visual field; GVF goldmann visual field; OCT optical coherence tomography; IOP intraocular pressure; BRVO Branch retinal vein occlusion; CRVO central retinal vein occlusion; CRAO central retinal artery occlusion; BRAO branch retinal artery occlusion; RT retinal tear; SB scleral buckle;  PPV pars plana vitrectomy; VH Vitreous hemorrhage; PRP panretinal laser photocoagulation; IVK intravitreal kenalog; VMT vitreomacular traction; MH Macular hole;  NVD neovascularization of the disc; NVE neovascularization elsewhere; AREDS age related eye disease study; ARMD  age related macular degeneration; POAG primary open angle glaucoma; EBMD epithelial/anterior basement membrane dystrophy; ACIOL anterior chamber intraocular lens; IOL intraocular lens; PCIOL posterior chamber intraocular lens; Phaco/IOL phacoemulsification with intraocular lens placement; PRK photorefractive keratectomy; LASIK laser assisted in situ keratomileusis; HTN hypertension; DM diabetes mellitus; COPD chronic obstructive pulmonary disease

## 2021-04-06 NOTE — Assessment & Plan Note (Signed)
OS continued clearance, reviewed photographically and anatomically with the patient at the viewing screen today.  Now able to see neovascularization of the disc and elsewhere superonasally to the nerve

## 2021-04-06 NOTE — Assessment & Plan Note (Signed)
High risk characteristics with elevated NVD on the nerve as well as small NVE superonasal to the nerve

## 2021-04-12 ENCOUNTER — Other Ambulatory Visit: Payer: Self-pay

## 2021-04-12 ENCOUNTER — Ambulatory Visit (INDEPENDENT_AMBULATORY_CARE_PROVIDER_SITE_OTHER): Payer: BC Managed Care – PPO

## 2021-04-12 ENCOUNTER — Ambulatory Visit (INDEPENDENT_AMBULATORY_CARE_PROVIDER_SITE_OTHER): Payer: BC Managed Care – PPO | Admitting: Podiatry

## 2021-04-12 ENCOUNTER — Other Ambulatory Visit: Payer: Self-pay | Admitting: Podiatry

## 2021-04-12 VITALS — Temp 98.4°F

## 2021-04-12 DIAGNOSIS — M1460 Charcot's joint, unspecified site: Secondary | ICD-10-CM

## 2021-04-12 DIAGNOSIS — L97411 Non-pressure chronic ulcer of right heel and midfoot limited to breakdown of skin: Secondary | ICD-10-CM

## 2021-04-12 DIAGNOSIS — E08621 Diabetes mellitus due to underlying condition with foot ulcer: Secondary | ICD-10-CM | POA: Diagnosis not present

## 2021-04-12 DIAGNOSIS — M216X1 Other acquired deformities of right foot: Secondary | ICD-10-CM | POA: Diagnosis not present

## 2021-04-12 DIAGNOSIS — L97512 Non-pressure chronic ulcer of other part of right foot with fat layer exposed: Secondary | ICD-10-CM | POA: Diagnosis not present

## 2021-04-12 MED ORDER — AMOXICILLIN-POT CLAVULANATE 875-125 MG PO TABS: 1.0000 | ORAL_TABLET | Freq: Two times a day (BID) | ORAL | 0 refills | Status: DC

## 2021-04-13 ENCOUNTER — Encounter (INDEPENDENT_AMBULATORY_CARE_PROVIDER_SITE_OTHER): Payer: Medicare Other | Admitting: Ophthalmology

## 2021-04-13 ENCOUNTER — Ambulatory Visit: Payer: Medicare Other

## 2021-04-13 DIAGNOSIS — L97411 Non-pressure chronic ulcer of right heel and midfoot limited to breakdown of skin: Secondary | ICD-10-CM

## 2021-04-13 LAB — BASIC METABOLIC PANEL
BUN/Creatinine Ratio: 16 (calc) (ref 6–22)
BUN: 19 mg/dL (ref 7–25)
CO2: 30 mmol/L (ref 20–32)
Calcium: 9.2 mg/dL (ref 8.6–10.4)
Chloride: 92 mmol/L — ABNORMAL LOW (ref 98–110)
Creat: 1.21 mg/dL — ABNORMAL HIGH (ref 0.50–1.03)
Glucose, Bld: 356 mg/dL — ABNORMAL HIGH (ref 65–99)
Potassium: 4.5 mmol/L (ref 3.5–5.3)
Sodium: 135 mmol/L (ref 135–146)

## 2021-04-13 LAB — CBC WITH DIFFERENTIAL/PLATELET
Absolute Monocytes: 768 cells/uL (ref 200–950)
Basophils Absolute: 61 cells/uL (ref 0–200)
Basophils Relative: 0.9 %
Eosinophils Absolute: 170 cells/uL (ref 15–500)
Eosinophils Relative: 2.5 %
HCT: 39.1 % (ref 35.0–45.0)
Hemoglobin: 12.7 g/dL (ref 11.7–15.5)
Lymphs Abs: 1462 cells/uL (ref 850–3900)
MCH: 31.5 pg (ref 27.0–33.0)
MCHC: 32.5 g/dL (ref 32.0–36.0)
MCV: 97 fL (ref 80.0–100.0)
MPV: 10.8 fL (ref 7.5–12.5)
Monocytes Relative: 11.3 %
Neutro Abs: 4338 cells/uL (ref 1500–7800)
Neutrophils Relative %: 63.8 %
Platelets: 271 10*3/uL (ref 140–400)
RBC: 4.03 10*6/uL (ref 3.80–5.10)
RDW: 11.3 % (ref 11.0–15.0)
Total Lymphocyte: 21.5 %
WBC: 6.8 10*3/uL (ref 3.8–10.8)

## 2021-04-13 LAB — SEDIMENTATION RATE: Sed Rate: 28 mm/h (ref 0–30)

## 2021-04-13 LAB — HEMOGLOBIN A1C
Hgb A1c MFr Bld: 11.6 % of total Hgb — ABNORMAL HIGH (ref <5.7)
Mean Plasma Glucose: 286 mg/dL
eAG (mmol/L): 15.9 mmol/L

## 2021-04-13 LAB — C-REACTIVE PROTEIN: CRP: 3.2 mg/L (ref <8.0)

## 2021-04-13 NOTE — Progress Notes (Signed)
SITUATION Reason for Consult: Follow-up with wound healing sandal Patient / Caregiver Report: Patient is satisfied with custom offloading  OBJECTIVE DATA History / Diagnosis:    ICD-10-CM   1. Diabetic ulcer of right midfoot associated with diabetes mellitus due to underlying condition, limited to breakdown of skin [facility])  N82.956    L97.411       Change in Pathology: None  ACTIONS PERFORMED Patient's equipment was checked for structural stability and fit. Added 1/4 PPT to patient's offloading sandal. Offloaded wound site. Device(s) intact and fit is excellent. All questions answered and concerns addressed.  PLAN Follow-up as needed (PRN). Plan of care discussed with and agreed upon by patient / caregiver.

## 2021-04-14 ENCOUNTER — Encounter (INDEPENDENT_AMBULATORY_CARE_PROVIDER_SITE_OTHER): Payer: Medicare Other | Admitting: Ophthalmology

## 2021-04-14 NOTE — Progress Notes (Signed)
Subjective:   Patient ID: [name] [name], female   DOB: 56 y.o.   MRN: [id number]   HPI 56 year old female presents the office today for concerns of a wound on the bottom of her right foot submetatarsal 2.  She states that she has been treated by infectious disease at [facility].  She states the wound is still not healing and presents today for further evaluation.  She has bloody drainage but no significant purulence.  No increase in swelling or redness.  Also she states that she had a spot on her right big toe but not sure what this is from.  She is noticing skin peeling.  Currently she denies any fevers or chills.  She states her sugar has been elevated.  Review of Systems  All other systems reviewed and are negative.  Past Medical History:  Diagnosis Date   Arthritis    Diabetic polyneuropathy ([place])    Diabetic polyneuropathy ([place])     Past Surgical History:  Procedure Laterality Date   CESAREAN SECTION       Current Outpatient Medications:    amoxicillin-clavulanate (AUGMENTIN) 875-125 MG tablet, Take 1 tablet by mouth 2 (two) times daily., Disp: 20 tablet, Rfl: 0   acetaminophen (TYLENOL) 325 MG tablet, Take 650 mg by mouth every 6 (six) hours as needed for mild pain, fever or headache., Disp: , Rfl:    gabapentin (NEURONTIN) 300 MG capsule, Take 300 mg by mouth 2 (two) times daily. , Disp: , Rfl:    ibuprofen (ADVIL) 200 MG tablet, Take 400 mg by mouth every 6 (six) hours as needed for fever, headache or mild pain., Disp: , Rfl:    insulin glargine (LANTUS) 100 UNIT/ML Solostar Pen, Inject 8 Units into the skin daily., Disp: 15 mL, Rfl: 2   insulin lispro (HUMALOG KWIKPEN) 100 UNIT/ML KiwkPen, Inject 3-10 Units into the skin 3 (three) times daily. , Disp: , Rfl:    Insulin Pen Needle (B-D ULTRAFINE III SHORT PEN) 31G X 8 MM MISC, Inject 1 each into the skin 4 (four) times daily -  before meals and at bedtime., Disp: 100 each, Rfl: 3   levothyroxine (SYNTHROID) 88 MCG tablet,  Take 88 mcg by mouth daily., Disp: , Rfl:    omeprazole (PRILOSEC) 20 MG capsule, Take 20 mg by mouth daily. , Disp: , Rfl:    ondansetron (ZOFRAN) 4 MG tablet, Take 4 mg by mouth every 8 (eight) hours as needed for nausea. , Disp: , Rfl:    ONETOUCH VERIO test strip, USE TO CHECK BLOOD SUGARS FOUR TIMES DAILY, Disp: , Rfl: 5   oxyCODONE (OXY IR/ROXICODONE) 5 MG immediate release tablet, Take 5-10 mg by mouth every 4 (four) hours as needed for pain., Disp: , Rfl:   Allergies  Allergen Reactions   Lisinopril Cough         Objective:  Physical Exam  General: AAO x3, NAD-presents today wearing a regular shoe  Dermatological: Full-thickness ulceration noted right foot submetatarsal 2.  Prior to debridement there is fibrogranular wound base with hyperkeratotic periwound.  After wound debridement today it measures 1.5 x 1.5 x 0.5 cm.  Prior to wound debridement it was smaller 1.2 x 1 x 0.3 cm.  It does not probe directly down to bone but is very close.  There is no surrounding erythema, ascending cellulitis.  There is no fluctuation or crepitation.  There is no malodor.  Superficial skin breakdown to right hallux with any drainage or pus.  Vascular: Dorsalis Pedis artery and Posterior Tibial artery pedal pulses are palpable bilateral with immedate capillary fill time. There is no pain with calf compression, swelling, warmth, erythema.   Neruologic: Sensation decreased with Thornell Mule monofilament  Musculoskeletal: Prominence of metatarsal head.  Muscular strength 5/5 in all groups tested bilateral.  Decreased range of motion of first MPJ.  Previously potation the second digit.     Assessment:   56 year old female with chronic ulceration right foot     Plan:  -Treatment options discussed including all alternatives, risks, and complications -Etiology of symptoms were discussed -X-rays were obtained and reviewed with the patient.  There is no definitive evidence of acute  fracture, osteomyelitis or soft tissue emphysema today. -I sharply debrided the wound on the right foot utilizing a #312 with scalpel without any complications down to healthy, bleeding, granular tissue.  I debrided nonviable devitalized tissue in order to promote wound healing.  There is minimal blood loss and hemostasis achieved through manual compression.  I irrigated the wound with saline.  Silvadene was applied followed by dressing.  I will order silver alginate through prism.  Recommend offloading.  Surgical shoe was dispensed and she is in a follow-up tomorrow with our orthotist, Aaron Edelman for offloading.  Order blood work including CBC, BMP, sed rate, CRP, A1c. If abnormal will order MRI. -Monitor for any clinical signs or symptoms of infection and directed to call the office immediately should any occur or go to the ER.  Return in about 10 days (around 04/22/2021) for ulcer .  Trula Slade DPM

## 2021-04-15 ENCOUNTER — Other Ambulatory Visit: Payer: Self-pay

## 2021-04-15 ENCOUNTER — Encounter (INDEPENDENT_AMBULATORY_CARE_PROVIDER_SITE_OTHER): Payer: Self-pay | Admitting: Ophthalmology

## 2021-04-15 ENCOUNTER — Ambulatory Visit (INDEPENDENT_AMBULATORY_CARE_PROVIDER_SITE_OTHER): Payer: BC Managed Care – PPO | Admitting: Ophthalmology

## 2021-04-15 DIAGNOSIS — E103491 Type 1 diabetes mellitus with severe nonproliferative diabetic retinopathy without macular edema, right eye: Secondary | ICD-10-CM | POA: Diagnosis not present

## 2021-04-15 DIAGNOSIS — E103592 Type 1 diabetes mellitus with proliferative diabetic retinopathy without macular edema, left eye: Secondary | ICD-10-CM

## 2021-04-15 LAB — WOUND CULTURE
MICRO NUMBER:: 13001292
SPECIMEN QUALITY:: ADEQUATE

## 2021-04-15 LAB — HOUSE ACCOUNT TRACKING

## 2021-04-15 NOTE — Progress Notes (Signed)
04/15/2021     CHIEF COMPLAINT Patient presents for  Chief Complaint  Patient presents with   Retina Follow Up      HISTORY OF PRESENT ILLNESS: [name] [name] is a 56 y.o. female who presents to the clinic today for:   HPI     Retina Follow Up           Diagnosis: Other   Laterality: right eye   Onset: 1 week ago   Severity: mild   Duration: 1 week   Course: stable         Comments   1 week PRP OD and dilate OD  Pt states VA OU stable since last visit. Pt denies FOL, floaters, or ocular pain OU.        Last edited by Demetrios Loll, COA on 04/15/2021  8:49 AM.      Referring physician: Ronda Fairly, MD [address]  [place] [address]  HISTORICAL INFORMATION:   Selected notes from the MEDICAL RECORD NUMBER    Lab Results  Component Value Date   HGBA1C 11.6 (H) 04/12/2021     CURRENT MEDICATIONS: No current outpatient medications on file. (Ophthalmic Drugs)   No current facility-administered medications for this visit. (Ophthalmic Drugs)   Current Outpatient Medications (Other)  Medication Sig   acetaminophen (TYLENOL) 325 MG tablet Take 650 mg by mouth every 6 (six) hours as needed for mild pain, fever or headache.   amoxicillin-clavulanate (AUGMENTIN) 875-125 MG tablet Take 1 tablet by mouth 2 (two) times daily.   gabapentin (NEURONTIN) 300 MG capsule Take 300 mg by mouth 2 (two) times daily.    ibuprofen (ADVIL) 200 MG tablet Take 400 mg by mouth every 6 (six) hours as needed for fever, headache or mild pain.   insulin glargine (LANTUS) 100 UNIT/ML Solostar Pen Inject 8 Units into the skin daily.   insulin lispro (HUMALOG KWIKPEN) 100 UNIT/ML KiwkPen Inject 3-10 Units into the skin 3 (three) times daily.    Insulin Pen Needle (B-D ULTRAFINE III SHORT PEN) 31G X 8 MM MISC Inject 1 each into the skin 4 (four) times daily -  before meals and at bedtime.   levothyroxine (SYNTHROID) 88 MCG tablet Take 88 mcg by mouth daily.    omeprazole (PRILOSEC) 20 MG capsule Take 20 mg by mouth daily.    ondansetron (ZOFRAN) 4 MG tablet Take 4 mg by mouth every 8 (eight) hours as needed for nausea.    ONETOUCH VERIO test strip USE TO CHECK BLOOD SUGARS FOUR TIMES DAILY   oxyCODONE (OXY IR/ROXICODONE) 5 MG immediate release tablet Take 5-10 mg by mouth every 4 (four) hours as needed for pain.   No current facility-administered medications for this visit. (Other)      REVIEW OF SYSTEMS:    ALLERGIES Allergies  Allergen Reactions   Lisinopril Cough    PAST MEDICAL HISTORY Past Medical History:  Diagnosis Date   Arthritis    Diabetic polyneuropathy (HCC)    Diabetic polyneuropathy (HCC)    Past Surgical History:  Procedure Laterality Date   CESAREAN SECTION      FAMILY HISTORY History reviewed. No pertinent family history.  SOCIAL HISTORY Social History   Tobacco Use   Smoking status: Never   Smokeless tobacco: Never  Substance Use Topics   Alcohol use: Yes    Comment: occ   Drug use: Never         OPHTHALMIC EXAM:  Base Eye Exam  Visual Acuity (ETDRS)       Right Left   Dist [place] 20/15 20/20         Tonometry (Tonopen, 8:53 AM)       Right Left   Pressure 16 16         Pupils       Pupils React APD   Right PERRL Brisk None   Left PERRL Brisk None         Visual Fields (Counting fingers)       Left Right    Full Full         Extraocular Movement       Right Left    Full, Ortho Full, Ortho         Neuro/Psych     Oriented x3: Yes   Mood/Affect: Normal         Dilation     Right eye: 1.0% Mydriacyl, 2.5% Phenylephrine @ 8:53 AM           Slit Lamp and Fundus Exam     External Exam       Right Left   External Normal Normal         Slit Lamp Exam       Right Left   Lids/Lashes Normal Normal   Conjunctiva/Sclera White and quiet White and quiet   Cornea Clear Clear   Anterior Chamber Deep and quiet Deep and quiet   Iris Round and  reactive Round and reactive, no neovascularization, undilated   Lens 2+ Nuclear sclerosis 2+ Nuclear sclerosis   Anterior Vitreous Normal Normal         Fundus Exam       Right Left   Posterior Vitreous Normal Vitreous hemorrhage mostly cleared, thin attenuated, old debris inferior   Disc Normal Neovascularization.  Large, greater than 10 cc standard photo, elevated with vitreal papillary traction   C/D Ratio 0.35 0.35   Macula Normal No Details   Vessels NPDR-Severe NPDR-Severe   Periphery Normal Normal            IMAGING AND PROCEDURES  Imaging and Procedures for 04/15/21  Panretinal Photocoagulation - OD - Right Eye       Time Out Confirmed correct patient, procedure, site, and patient consented.   Anesthesia Topical anesthesia was used. Anesthetic medications included Proparacaine 0.5%.   Laser Information The type of laser was diode. Color was yellow. The duration in seconds was 0.03. The spot size was 390 microns. Laser power was 280. Total spots was 493.   Post-op The patient tolerated the procedure well. There were no complications. The patient received written and verbal post procedure care education.   Notes OD PRP delivered temporal window region of nonperfusion as well as nasally     OCT, Retina - OU - Both Eyes       Right Eye Quality was good. Scan locations included subfoveal. Central Foveal Thickness: 234. Progression has no prior data. Findings include normal foveal contour.   Left Eye Central Foveal Thickness: 207. Findings include abnormal foveal contour.   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 Severe nonproliferative diabetic retinopathy of right eye, without macular edema, associated with type 1 diabetes mellitus (HCC)  F79.0240 Panretinal Photocoagulation - OD - Right Eye    OCT, Retina - OU - Both Eyes    2. 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  OD for PRP #1 today for severe NPDR with retinal nonperfusion in order to prevent progression to PDR and vitreous hemorrhage as has occurred in the left eye only PRP #2 in 4 weeks or so  2.  OS, status post PRP #1 with clearance of vitreous hemorrhage for early PDR.  Needs PRP #2 in the coming weeks  3.  Ophthalmic Meds Ordered this visit:  No orders of the defined types were placed in this encounter.      Return in about 2 weeks (around 04/29/2021) for dilate, OS, PRP,, #2, nasal and superior.  There are no Patient Instructions on file for this visit.   Explained the diagnoses, plan, and follow up with the patient and they expressed understanding.  Patient expressed understanding of the importance of proper follow up care.   Alford Highland Temiloluwa Laredo M.D. Diseases & Surgery of the Retina and Vitreous [facility] 04/15/21     Abbreviations: M myopia (nearsighted); A astigmatism; H hyperopia (farsighted); P presbyopia; Mrx spectacle prescription;  CTL contact lenses; OD right eye; OS left eye; OU both eyes  XT exotropia; ET esotropia; PEK punctate epithelial keratitis; PEE punctate epithelial erosions; DES dry eye syndrome; MGD meibomian gland dysfunction; ATs artificial tears; PFAT's preservative free artificial tears; NSC nuclear sclerotic cataract; PSC posterior subcapsular cataract; ERM epi-retinal  membrane; PVD posterior vitreous detachment; RD retinal detachment; DM diabetes mellitus; DR diabetic retinopathy; NPDR non-proliferative diabetic retinopathy; PDR proliferative diabetic retinopathy; CSME clinically significant macular edema; DME diabetic macular edema; dbh dot blot hemorrhages; CWS cotton wool spot; POAG primary open angle glaucoma; C/D cup-to-disc ratio; HVF humphrey visual field; GVF goldmann visual field; OCT optical coherence tomography; IOP intraocular pressure; BRVO Branch retinal vein occlusion; CRVO central retinal vein occlusion; CRAO central retinal artery occlusion; BRAO branch retinal artery occlusion; RT retinal tear; SB scleral buckle; PPV pars plana vitrectomy; VH Vitreous hemorrhage; PRP panretinal laser photocoagulation; IVK intravitreal kenalog; VMT vitreomacular traction; MH Macular hole;  NVD neovascularization of the disc; NVE neovascularization elsewhere; AREDS age related eye disease study; ARMD age related macular degeneration; POAG primary open angle glaucoma; EBMD epithelial/anterior basement membrane dystrophy; ACIOL anterior chamber intraocular lens; IOL intraocular lens; PCIOL posterior chamber intraocular lens; Phaco/IOL phacoemulsification with intraocular lens placement; PRK photorefractive keratectomy; LASIK laser assisted in situ keratomileusis; HTN hypertension; DM diabetes mellitus; COPD chronic obstructive pulmonary disease

## 2021-04-15 NOTE — Assessment & Plan Note (Signed)
OD no complications PRP #1 temporally as well as nasal

## 2021-04-15 NOTE — Assessment & Plan Note (Signed)
OS, vitreous hemorrhage is cleared post PRP #1 inferiorly, will need to deliver PRP #2 in 1 to 2 weeks

## 2021-04-19 ENCOUNTER — Other Ambulatory Visit: Payer: Self-pay | Admitting: Podiatry

## 2021-04-19 ENCOUNTER — Telehealth: Payer: Self-pay | Admitting: *Deleted

## 2021-04-19 MED ORDER — SULFAMETHOXAZOLE-TRIMETHOPRIM 800-160 MG PO TABS: 1.0000 | ORAL_TABLET | Freq: Two times a day (BID) | ORAL | 0 refills | Status: DC

## 2021-04-19 NOTE — Telephone Encounter (Signed)
Patient is calling because she started having diarrhea, extreme body aches this morning , yesterday she started to have right side lower back pain which is progressively getting worse. Her wound is still draining , unchanged since visit. Still taking antibiotic.Please advise.

## 2021-04-22 ENCOUNTER — Telehealth: Payer: Self-pay

## 2021-04-22 ENCOUNTER — Ambulatory Visit (INDEPENDENT_AMBULATORY_CARE_PROVIDER_SITE_OTHER): Payer: BC Managed Care – PPO | Admitting: Podiatry

## 2021-04-22 ENCOUNTER — Ambulatory Visit: Payer: BC Managed Care – PPO

## 2021-04-22 ENCOUNTER — Other Ambulatory Visit: Payer: Self-pay

## 2021-04-22 DIAGNOSIS — E109 Type 1 diabetes mellitus without complications: Secondary | ICD-10-CM

## 2021-04-22 DIAGNOSIS — L97512 Non-pressure chronic ulcer of other part of right foot with fat layer exposed: Secondary | ICD-10-CM

## 2021-04-22 DIAGNOSIS — E08621 Diabetes mellitus due to underlying condition with foot ulcer: Secondary | ICD-10-CM

## 2021-04-22 DIAGNOSIS — L97411 Non-pressure chronic ulcer of right heel and midfoot limited to breakdown of skin: Secondary | ICD-10-CM

## 2021-04-22 NOTE — Progress Notes (Signed)
SITUATION Reason for Consult: Evaluation for Prefabricated Diabetic Shoes and Custom Diabetic Inserts. Patient / Caregiver Report: Patient would like well fitting shoes  OBJECTIVE DATA: Patient History / Diagnosis:    ICD-10-CM   1. Type 1 diabetes mellitus without complication (HCC)  [id number]     2. Right foot ulcer, with fat layer exposed ([place])  L97.512       Current or Previous Devices:   None and no history  In-Person Foot Examination: Ulcers & Callousing:   Historical  Deformities:   - None   Shoe Size: 7.23M  ORTHOTIC RECOMMENDATION Recommended Devices: - 1x pair prefabricated PDAC approved diabetic shoes; Patient Selected - Shon Millet Blue 843 Size 7.23M - 3x pair custom-to-patient PDAC approved vacuum formed diabetic insoles.  GOALS OF SHOES AND INSOLES - Reduce shear and pressure - Reduce / Prevent callus formation - Reduce / Prevent ulceration - Protect the fragile healing compromised diabetic foot.  Patient would benefit from diabetic shoes and inserts as patient has diabetes mellitus and the patient has one or more of the following conditions: - History of partial or complete amputation of the foot - History of previous foot ulceration. - History of pre-ulcerative callus - Peripheral neuropathy with evidence of callus formation - Foot deformity - Poor circulation  ACTIONS PERFORMED Patient was casted for insoles via crush box and measured for shoes via brannock device. Procedure was explained and patient tolerated procedure well. All questions were answered and concerns addressed.  PLAN Patient is to be contacted and scheduled for fitting once CMN is obtained from treaing physician and shoes and insoles have been fabricated and received.

## 2021-04-22 NOTE — Telephone Encounter (Signed)
Casts sent to [employer] - HOLD FOR CMN ?

## 2021-04-25 NOTE — Progress Notes (Signed)
Subjective: 56 year old female presents also for evaluation of wound on the bottom of her right foot, submetatarsal 2.  She had called few days ago and she reported viral-like symptoms.  Given the wound is concerned by infection she was seen at the emergency room and was discharged home.  She is continue with daily dressing changes.  She does get some bloody drainage but no frank purulence.  Denies any fevers or chills.  No nausea or vomiting.  No other concerns.  Objective: AAO x3, NAD DP/PT pulses palpable bilaterally, CRT less than 3 seconds On the right foot submetatarsal 2 is a granular wound with hyperkeratotic periwound.  After debridement the wound measures 1.3 x 1.1 x 0.4 cm.  Prior to wound debridement it was somewhat smaller at 1.1 x 1 x 0.2 cm.  There is no probing to bone, undermining or tunneling.  There is no fluctuation or crepitation.  There is no malodor. No pain with calf compression, swelling, warmth, erythema  Assessment: Right foot ulcer  Plan: -All treatment options discussed with the patient including all alternatives, risks, complications.  -Reviewed the blood work and culture results.  Continue Bactrim. -She will be debrided the wound today utilizing a #312 scalpel without any complications down to healthy, bleeding, granular tissue in order to promote wound healing and to remove nonviable devitalized tissue.  There was minimal blood loss and hemostasis was achieved through manual compression.  The wound was irrigated with saline.  Silvadene was applied followed by dressing.  She tolerated the procedure well and complications. -Continue offloading. -Patient encouraged to call the office with any questions, concerns, change in symptoms.   [name] DPM

## 2021-04-29 ENCOUNTER — Encounter (INDEPENDENT_AMBULATORY_CARE_PROVIDER_SITE_OTHER): Payer: Medicare Other | Admitting: Ophthalmology

## 2021-05-04 ENCOUNTER — Ambulatory Visit (INDEPENDENT_AMBULATORY_CARE_PROVIDER_SITE_OTHER): Payer: BC Managed Care – PPO | Admitting: Ophthalmology

## 2021-05-04 ENCOUNTER — Other Ambulatory Visit: Payer: Self-pay

## 2021-05-04 ENCOUNTER — Encounter (INDEPENDENT_AMBULATORY_CARE_PROVIDER_SITE_OTHER): Payer: Self-pay | Admitting: Ophthalmology

## 2021-05-04 DIAGNOSIS — E103592 Type 1 diabetes mellitus with proliferative diabetic retinopathy without macular edema, left eye: Secondary | ICD-10-CM

## 2021-05-04 DIAGNOSIS — E103491 Type 1 diabetes mellitus with severe nonproliferative diabetic retinopathy without macular edema, right eye: Secondary | ICD-10-CM | POA: Diagnosis not present

## 2021-05-04 NOTE — Assessment & Plan Note (Signed)
Post PRP #1, stable looks great ?

## 2021-05-04 NOTE — Progress Notes (Signed)
05/04/2021     CHIEF COMPLAINT Patient presents for  Chief Complaint  Patient presents with   Diabetic Retinopathy without Macular Edema      HISTORY OF PRESENT ILLNESS: [name] [name] is a 56 y.o. female who presents to the clinic today for:   HPI   2 weeks and 5 days Dilate OS, PRP, #2 nasal and superior. Patient states vision is stable and unchanged since last visit. Denies any new floaters or FOL. LBS: this morning before eating, pt states it was 158. Last edited by [name] on 05/04/2021  1:41 PM.      Referring physician: Ronda Fairly, MD [address] [place],  [place] [address]  HISTORICAL INFORMATION:   Selected notes from the MEDICAL RECORD NUMBER    Lab Results  Component Value Date   HGBA1C 11.6 (H) 04/12/2021     CURRENT MEDICATIONS: No current outpatient medications on file. (Ophthalmic Drugs)   No current facility-administered medications for this visit. (Ophthalmic Drugs)   Current Outpatient Medications (Other)  Medication Sig   acetaminophen (TYLENOL) 325 MG tablet Take 650 mg by mouth every 6 (six) hours as needed for mild pain, fever or headache.   gabapentin (NEURONTIN) 300 MG capsule Take 300 mg by mouth 2 (two) times daily.    ibuprofen (ADVIL) 200 MG tablet Take 400 mg by mouth every 6 (six) hours as needed for fever, headache or mild pain.   insulin glargine (LANTUS) 100 UNIT/ML Solostar Pen Inject 8 Units into the skin daily.   insulin lispro (HUMALOG KWIKPEN) 100 UNIT/ML KiwkPen Inject 3-10 Units into the skin 3 (three) times daily.    Insulin Pen Needle (B-D ULTRAFINE III SHORT PEN) 31G X 8 MM MISC Inject 1 each into the skin 4 (four) times daily -  before meals and at bedtime.   levothyroxine (SYNTHROID) 88 MCG tablet Take 88 mcg by mouth daily.   omeprazole (PRILOSEC) 20 MG capsule Take 20 mg by mouth daily.    ondansetron (ZOFRAN) 4 MG tablet Take 4 mg by mouth every 8 (eight) hours as needed for nausea.     ONETOUCH VERIO test strip USE TO CHECK BLOOD SUGARS FOUR TIMES DAILY   oxyCODONE (OXY IR/ROXICODONE) 5 MG immediate release tablet Take 5-10 mg by mouth every 4 (four) hours as needed for pain.   sulfamethoxazole-trimethoprim (BACTRIM DS) 800-160 MG tablet Take 1 tablet by mouth 2 (two) times daily.   No current facility-administered medications for this visit. (Other)      REVIEW OF SYSTEMS:    ALLERGIES Allergies  Allergen Reactions   Lisinopril Cough    PAST MEDICAL HISTORY Past Medical History:  Diagnosis Date   Arthritis    Diabetic polyneuropathy (HCC)    Diabetic polyneuropathy (HCC)    Past Surgical History:  Procedure Laterality Date   CESAREAN SECTION      FAMILY HISTORY History reviewed. No pertinent family history.  SOCIAL HISTORY Social History   Tobacco Use   Smoking status: Never   Smokeless tobacco: Never  Substance Use Topics   Alcohol use: Yes    Comment: occ   Drug use: Never         OPHTHALMIC EXAM:  Base Eye Exam     Visual Acuity (ETDRS)       Right Left   Dist [place] 20/20 20/25 +2         Tonometry (Tonopen, 1:44 PM)       Right Left  Pressure 15 14         Pupils       Pupils Dark Light APD   Right PERRL 6 6 None   Left PERRL 6 6 None         Extraocular Movement       Right Left    Full Full         Neuro/Psych     Oriented x3: Yes   Mood/Affect: Normal         Dilation     Left eye: 1.0% Mydriacyl, 2.5% Phenylephrine @ 1:44 PM           Slit Lamp and Fundus Exam     External Exam       Right Left   External Normal Normal         Slit Lamp Exam       Right Left   Lids/Lashes Normal Normal   Conjunctiva/Sclera White and quiet White and quiet   Cornea Clear Clear   Anterior Chamber Deep and quiet Deep and quiet   Iris Round and reactive Round and reactive, no neovascularization, undilated   Lens 2+ Nuclear sclerosis 2+ Nuclear sclerosis   Anterior Vitreous Normal Normal          Fundus Exam       Right Left   Posterior Vitreous  Vitreous hemorrhage mostly cleared, thin attenuated, old debris inferior   Disc  Neovascularization.  Large, greater than 10 cc standard photo, elevated with vitreal papillary traction   C/D Ratio  0.35   Macula  No Details   Vessels  NPDR-Severe   Periphery  Normal            IMAGING AND PROCEDURES  Imaging and Procedures for 05/04/21  Panretinal Photocoagulation - OS - Left Eye       Time Out Confirmed correct patient, procedure, site, and patient consented.   Anesthesia Topical anesthesia was used. Anesthetic medications included Proparacaine 0.5%.   Laser Information The type of laser was diode. Color was yellow. The duration in seconds was 0.03. The spot size was 390 microns. Laser power was 300. Total spots was 631.   Post-op The patient tolerated the procedure well. There were no complications. The patient received written and verbal post procedure care education.   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 Severe nonproliferative diabetic retinopathy of right eye, without macular edema, associated with type 1 diabetes mellitus (HCC)  X83.3383 Panretinal Photocoagulation - OS - Left Eye    2. 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  OD, post PRP #1 continue to observe, will monitor nasal retina for nonperfusion in the future  2.  OS,  severe nonperfusion treated nasally and inferiorly, will treat superiorly and temporally today  3.  Ophthalmic Meds Ordered this visit:  No orders of the defined types were placed in this encounter.      Return  in about 3 months (around 08/04/2021) for DILATE OU, COLOR FP, OCT.  There are no Patient Instructions on file for this visit.   Explained the diagnoses, plan, and follow up with the patient and they expressed understanding.  Patient expressed understanding of the importance of proper follow up care.   Alford Highland Lynann Demetrius M.D. Diseases & Surgery of the Retina and Vitreous [facility] 05/04/21     Abbreviations: M myopia (nearsighted); A astigmatism; H hyperopia (farsighted); P presbyopia; Mrx spectacle prescription;  CTL contact lenses; OD right eye; OS left eye; OU both eyes  XT exotropia; ET esotropia; PEK punctate epithelial keratitis; PEE punctate epithelial erosions; DES dry eye syndrome; MGD meibomian gland dysfunction; ATs artificial tears; PFAT's preservative free artificial tears; NSC nuclear sclerotic cataract; PSC posterior subcapsular cataract; ERM epi-retinal membrane; PVD posterior vitreous detachment; RD retinal detachment; DM diabetes mellitus; DR diabetic retinopathy; NPDR non-proliferative diabetic retinopathy; PDR proliferative diabetic retinopathy; CSME clinically significant macular edema; DME diabetic macular edema; dbh dot blot hemorrhages; CWS cotton wool spot; POAG primary open angle glaucoma; C/D cup-to-disc ratio; HVF humphrey visual field; GVF goldmann visual field; OCT optical coherence tomography; IOP intraocular pressure; BRVO Branch retinal vein occlusion; CRVO central retinal vein occlusion; CRAO central retinal artery occlusion; BRAO branch retinal artery occlusion; RT retinal tear; SB scleral buckle; PPV pars plana vitrectomy; VH Vitreous hemorrhage; PRP panretinal laser photocoagulation; IVK intravitreal kenalog; VMT vitreomacular  traction; MH Macular hole;  NVD neovascularization of the disc; NVE neovascularization elsewhere; AREDS age related eye disease study; ARMD age related macular degeneration; POAG primary open angle glaucoma; EBMD epithelial/anterior basement membrane dystrophy; ACIOL anterior chamber intraocular lens; IOL intraocular lens; PCIOL posterior chamber intraocular lens; Phaco/IOL phacoemulsification with intraocular lens placement; PRK photorefractive keratectomy; LASIK laser assisted in situ keratomileusis; HTN hypertension; DM diabetes mellitus; COPD chronic obstructive pulmonary disease

## 2021-05-04 NOTE — Assessment & Plan Note (Signed)
PRP #2 OS today superior, temporally all anterior to the equator ?

## 2021-05-06 ENCOUNTER — Other Ambulatory Visit: Payer: Self-pay

## 2021-05-06 ENCOUNTER — Ambulatory Visit (INDEPENDENT_AMBULATORY_CARE_PROVIDER_SITE_OTHER): Payer: Medicare Other | Admitting: Podiatry

## 2021-05-06 DIAGNOSIS — L97512 Non-pressure chronic ulcer of other part of right foot with fat layer exposed: Secondary | ICD-10-CM

## 2021-05-06 DIAGNOSIS — E109 Type 1 diabetes mellitus without complications: Secondary | ICD-10-CM

## 2021-05-09 NOTE — Progress Notes (Signed)
Subjective: ?56 year old female presents also for evaluation of wound on the bottom of her right foot, submetatarsal 2.  She says the wound is doing better and she still has some bloody drainage but no frank purulence.  No pain.  Occasional swelling.  No fevers or chills or any nausea or vomiting.  Last A1c was 11 but last glucose was 118.  She is continue daily dressing changes and she has no other concerns today.   ? ?Objective: ?AAO x3, NAD ?DP/PT pulses palpable bilaterally, CRT less than 3 seconds ?On the right foot submetatarsal 2 is a granular wound with hyperkeratotic periwound.  Today the wound does measure 1.3 x 1 x 0.2 cm.  Pre and post wound measurements of the same.  All about the same diameter has filled it is not as deep.  There is no probing, amount or tunneling.  No significant surrounding erythema, ascending cellulitis.  No fluctuation or crepitation.  There is no malodor. ?No pain with calf compression, swelling, warmth, erythema ? ?Assessment: ?Right foot ulcer ? ?Plan: ?-All treatment options discussed with the patient including all alternatives, risks, complications.  ?-Finish course of antibiotics. ?-She will be debrided the wound today utilizing a #312 scalpel without any complications down to healthy, bleeding, granular tissue in order to promote wound healing and to remove nonviable devitalized tissue.  There was minimal blood loss and hemostasis was achieved through manual compression.  The wound was irrigated with saline.  Silvadene was applied followed by dressing.  She tolerated the procedure well and complications. ?-I have ordered dressings through prism including silver alginate ?-Continue offloading. ?-Glucose control. ?-Patient encouraged to call the office with any questions, concerns, change in symptoms.  ? ?[name] DPM ? ?

## 2021-05-21 ENCOUNTER — Other Ambulatory Visit: Payer: Self-pay

## 2021-05-21 ENCOUNTER — Encounter: Payer: Self-pay | Admitting: Podiatry

## 2021-05-21 ENCOUNTER — Ambulatory Visit (INDEPENDENT_AMBULATORY_CARE_PROVIDER_SITE_OTHER): Payer: BC Managed Care – PPO | Admitting: Podiatry

## 2021-05-21 ENCOUNTER — Ambulatory Visit (INDEPENDENT_AMBULATORY_CARE_PROVIDER_SITE_OTHER): Payer: BC Managed Care – PPO

## 2021-05-21 DIAGNOSIS — L97512 Non-pressure chronic ulcer of other part of right foot with fat layer exposed: Secondary | ICD-10-CM

## 2021-05-24 NOTE — Progress Notes (Signed)
Subjective: ?56 year old female presents also for evaluation of wound on the bottom of her right foot, submetatarsal 2.  She states that it is getting less deep.  She has not seen any increase in swelling or redness or any drainage or pus.  No fevers or chills that she reports.  She is continue with daily dressing changes.  She has been using silver alginate.  ? ?Last A1c was 1.6 on April 12, 2018. ? ?Objective: ?AAO x3, NAD ?DP/PT pulses palpable bilaterally, CRT less than 3 seconds ?On the right foot submetatarsal 2 is a granular wound with hyperkeratotic periwound.  Today the wound does measure 1 x 1 x 0.2 cm.  There are areas that are more superficial with granulation tissue and with superficial and even with the skin.  Pre and post wound measurements of the same.There is no probing, amount or tunneling.  No significant surrounding erythema, ascending cellulitis.  No fluctuation or crepitation.  There is no malodor. ?No pain with calf compression, swelling, warmth, erythema ? ?Assessment: ?Right foot ulcer ? ?Plan: ?-All treatment options discussed with the patient including all alternatives, risks, complications.  ?-X-rays obtained reviewed.  No evidence of acute fracture.  No definitive cortical changes suggestive of osteomyelitis at this time. ?-Sharply debrided the wound today utilizing a #312 scalpel without any complications down to healthy, bleeding, granular tissue in order to promote wound healing and to remove nonviable devitalized tissue.  There was minimal blood loss and hemostasis was achieved through manual compression.  The wound was irrigated with saline.  Silvadene was applied followed by dressing.  She tolerated the procedure well and complications.  Continue daily dressing changes with silver alginate ?-Continue offloading. ?-Glucose control. ?-Monitor for any clinical signs or symptoms of infection and directed to call the office immediately should any occur or go to the ER. ?-Patient  encouraged to call the office with any questions, concerns, change in symptoms.  ? ?[name] DPM ? ?

## 2021-06-08 ENCOUNTER — Ambulatory Visit (INDEPENDENT_AMBULATORY_CARE_PROVIDER_SITE_OTHER): Payer: BC Managed Care – PPO | Admitting: Ophthalmology

## 2021-06-08 ENCOUNTER — Encounter (INDEPENDENT_AMBULATORY_CARE_PROVIDER_SITE_OTHER): Payer: BC Managed Care – PPO | Admitting: Ophthalmology

## 2021-06-08 ENCOUNTER — Encounter (INDEPENDENT_AMBULATORY_CARE_PROVIDER_SITE_OTHER): Payer: Self-pay | Admitting: Ophthalmology

## 2021-06-08 DIAGNOSIS — H4312 Vitreous hemorrhage, left eye: Secondary | ICD-10-CM

## 2021-06-08 DIAGNOSIS — H2513 Age-related nuclear cataract, bilateral: Secondary | ICD-10-CM | POA: Diagnosis not present

## 2021-06-08 DIAGNOSIS — E103491 Type 1 diabetes mellitus with severe nonproliferative diabetic retinopathy without macular edema, right eye: Secondary | ICD-10-CM

## 2021-06-08 NOTE — Assessment & Plan Note (Signed)
Progression OS likely secondary to posterior hyaloid release as there is no longer visible neovascularization of the nerve OS as seen prior to PRP.  I explained to the patient that posterior hyaloid was released spontaneous is actually an effective way to halt neovascular tissue growth in an eye such as this but nonetheless completion of PRP will be needed if the vitreous hemorrhage clears further ?

## 2021-06-08 NOTE — Progress Notes (Signed)
? ? ?06/08/2021 ? ?  ? ?CHIEF COMPLAINT ?Patient presents for  ?Chief Complaint  ?Patient presents with  ? Diabetic Retinopathy without Macular Edema  ? ? ? ? ?HISTORY OF PRESENT ILLNESS: ?[name] [name] is a 56 y.o. female who presents to the clinic today for:  ? ?HPI   ?EWIP- 5 weeks since last visit. Dilate OU, COLOR FP, OCT. ?New floaters in the left eye and it started Friday night. It does not go away and pt described it as like a black film over her vision. ?Pt denies FOL. ? ? ? ?Last edited by Silvestre Moment on 06/08/2021  1:02 PM.  ?  ? ? ?Referring physician: ?Alver Fisher, MD ?[address] ?[address] ?[place],  [place] [address] ? ?HISTORICAL INFORMATION:  ? ?Selected notes from the [facility] ?  ? ?Lab Results  ?Component Value Date  ? HGBA1C 11.6 (H) 04/12/2021  ?  ? ?CURRENT MEDICATIONS: ?No current outpatient medications on file. (Ophthalmic Drugs)  ? ?No current facility-administered medications for this visit. (Ophthalmic Drugs)  ? ?Current Outpatient Medications (Other)  ?Medication Sig  ? acetaminophen (TYLENOL) 325 MG tablet Take 650 mg by mouth every 6 (six) hours as needed for mild pain, fever or headache.  ? dicyclomine (BENTYL) 10 MG capsule Take 10 mg by mouth 4 (four) times daily as needed.  ? gabapentin (NEURONTIN) 300 MG capsule Take 300 mg by mouth 2 (two) times daily.   ? ibuprofen (ADVIL) 200 MG tablet Take 400 mg by mouth every 6 (six) hours as needed for fever, headache or mild pain.  ? insulin glargine (LANTUS) 100 UNIT/ML Solostar Pen Inject 8 Units into the skin daily.  ? insulin lispro (HUMALOG KWIKPEN) 100 UNIT/ML KiwkPen Inject 3-10 Units into the skin 3 (three) times daily.   ? Insulin Pen Needle (B-D ULTRAFINE III SHORT PEN) 31G X 8 MM MISC Inject 1 each into the skin 4 (four) times daily -  before meals and at bedtime.  ? levothyroxine (SYNTHROID) 88 MCG tablet Take 88 mcg by mouth daily.  ? NOVOLIN R FLEXPEN RELION 100 UNIT/ML FlexPen Inject into the skin.  ? omeprazole  (PRILOSEC) 20 MG capsule Take 20 mg by mouth daily.   ? ondansetron (ZOFRAN) 4 MG tablet Take 4 mg by mouth every 8 (eight) hours as needed for nausea.   ? ONETOUCH VERIO test strip USE TO CHECK BLOOD SUGARS FOUR TIMES DAILY  ? oxyCODONE (OXY IR/ROXICODONE) 5 MG immediate release tablet Take 5-10 mg by mouth every 4 (four) hours as needed for pain.  ? ?No current facility-administered medications for this visit. (Other)  ? ? ? ? ?REVIEW OF SYSTEMS: ?ROS   ?Negative for: Constitutional, Gastrointestinal, Neurological, Skin, Genitourinary, Musculoskeletal, HENT, Endocrine, Cardiovascular, Eyes, Respiratory, Psychiatric, Allergic/Imm, Heme/Lymph ?Last edited by Silvestre Moment on 06/08/2021  1:02 PM.  ?  ? ? ? ?ALLERGIES ?Allergies  ?Allergen Reactions  ? Lisinopril Cough  ? ? ?PAST MEDICAL HISTORY ?Past Medical History:  ?Diagnosis Date  ? Arthritis   ? Diabetic polyneuropathy ([place])   ? Diabetic polyneuropathy ([place])   ? ?Past Surgical History:  ?Procedure Laterality Date  ? CESAREAN SECTION    ? ? ?FAMILY HISTORY ?History reviewed. No pertinent family history. ? ?SOCIAL HISTORY ?Social History  ? ?Tobacco Use  ? Smoking status: Never  ? Smokeless tobacco: Never  ?Substance Use Topics  ? Alcohol use: Yes  ?  Comment: occ  ? Drug use: Never  ? ?  ? ?  ? ?  OPHTHALMIC EXAM: ? ?Base Eye Exam   ? ? Visual Acuity (ETDRS)   ? ?   Right Left  ? Dist [place] 20/30 +2 20/30 -2  ? ?  ?  ? ? Tonometry (Tonopen, 1:07 PM)   ? ?   Right Left  ? Pressure 17 18  ? ?  ?  ? ? Pupils   ? ?   Pupils Dark Light Shape React APD  ? Right PERRL 5 5 Round Minimal None  ? Left PERRL 5 5 Round Minimal None  ? ?  ?  ? ? Visual Fields   ? ?   Left Right  ?  Full Full  ? ?  ?  ? ? Extraocular Movement   ? ?   Right Left  ?  Full Full  ? ?  ?  ? ? Neuro/Psych   ? ? Oriented x3: Yes  ? Mood/Affect: Normal  ? ?  ?  ? ? Dilation   ? ? Both eyes: 1.0% Mydriacyl, 2.5% Phenylephrine @ 1:07 PM  ? ?  ?  ? ?  ? ?Slit Lamp and Fundus Exam   ? ? External Exam   ? ?   Right  Left  ? External Normal Normal  ? ?  ?  ? ? Slit Lamp Exam   ? ?   Right Left  ? Lids/Lashes Normal Normal  ? Conjunctiva/Sclera White and quiet White and quiet  ? Cornea Clear Clear  ? Anterior Chamber Deep and quiet Deep and quiet  ? Iris Round and reactive Round and reactive, no neovascularization, undilated  ? Lens 2+ Nuclear sclerosis 2+ Nuclear sclerosis  ? Anterior Vitreous Normal Normal  ? ?  ?  ? ? Fundus Exam   ? ?   Right Left  ? Posterior Vitreous Normal Vitreous hemorrhage mostly cleared, thin attenuated, old debris inferior  ? 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  ? C/D Ratio 0.35 0.35  ? Macula Normal No Details  ? Vessels NPDR-Severe NPDR-Severe  ? Periphery Normal, peripheral anterior PRP nasal and temporal quadrants Normal no tears, good peripheral PRP nasal and temporal quadrants  ? ?  ?  ? ?  ? ? ?IMAGING AND PROCEDURES  ?Imaging and Procedures for 06/08/21 ? ?Color Fundus Photography Optos - OU - Both Eyes   ? ?   ?Right Eye ?Progression has no prior data. Disc findings include normal observations. Macula : microaneurysms.  ? ?Left Eye ?Progression has no prior data. Disc findings include neovascularization. Macula : microaneurysms.  ? ?Notes ?OD with severe NPDR.  Good peripheral PRP nasal and temporal ? ?OS, large prior neovascularization of the nerve is now released to traction into a PVD and Weiss ring.  Central vitreous opacity from vitreous hemorrhage, good peripheral PRP nasal and temporal ? ?Central vitreous opacity from hemorrhage ? ?  ? ? ?  ?  ? ?  ?ASSESSMENT/PLAN: ? ?Nuclear sclerotic cataract of both eyes ?Bilateral cataract of moderate extent.  However in type 1 diabetes and central dense vitreous hemorrhage, will often and most often recommend cataract surgery lens implantation left eye so as to allow for a secondary way for the  vitreous hemorrhage to abate but if it does not abate nor clear, vitrectomy can be carried out thereafter assuring that the anterior hyaloid can be removed and thus clearance of the  vitreous scaffold and this type 1 diabetes patient ? ?I will refer for evaluation with Teola Bradley, OD in [facility] who could thereafter arrange cataract surgery with a cataract surgeon perhaps Dr. Audry Pili ? ?Vitreous hemorrhage of left eye ([place]) ?Progression OS likely secondary to posterior hyaloid release as there is no longer visible neovascularization of the nerve OS as seen prior to PRP.  I explained to the patient that posterior hyaloid was released spontaneous is actually an effective way to halt neovascular tissue growth in an eye such as this but nonetheless completion of PRP will be needed if the vitreous hemorrhage clears further ? ?Severe nonproliferative diabetic retinopathy of right eye, without macular edema, associated with type 1 diabetes mellitus ([place]) ?Clear media, will continue to look for severe NPDR progression to PDR given the PDR development in the left eye ? ? ?Given the occurrence of hemorrhage in the left eye and progression to PDR, we will go ahead and complete peripheral PRP superiorly and inferiorly in the right eye to prevent progression to PDR and maintain excellent visual functioning  ? ?  ICD-10-CM   ?1. Severe nonproliferative diabetic retinopathy of right eye, without macular edema, associated with type 1 diabetes mellitus ([place])  [id number] Color Fundus Photography Optos - OU - Both Eyes  ?  CANCELED: OCT, Retina - OU - Both Eyes  ?  ?2. Nuclear sclerotic cataract of both eyes  H25.13   ?  ?3. Vitreous hemorrhage of left eye (HCC)  H43.12   ?  ? ? ?1.  Patient instructed in head of bed elevation throughout the night to allow gravity to pull this centrally out of review, as well as during the day ? ?2.  Complete PRP #2 OD in the coming week or 2 ? ?3.  Refer for general examination with Dr. Teola Bradley who can coordinate having cataract surgery intraocular lens implantation left eye initially so as to allow further and more rapid clearance of vitreous hemorrhage and or progression to vitrectomy shoul

## 2021-06-08 NOTE — Assessment & Plan Note (Addendum)
Clear media, will continue to look for severe NPDR progression to PDR given the PDR development in the left eye ? ? ?Given the occurrence of hemorrhage in the left eye and progression to PDR, we will go ahead and complete peripheral PRP superiorly and inferiorly in the right eye to prevent progression to PDR and maintain excellent visual functioning ?

## 2021-06-08 NOTE — Assessment & Plan Note (Signed)
Bilateral cataract of moderate extent.  However in type 1 diabetes and central dense vitreous hemorrhage, will often and most often recommend cataract surgery lens implantation left eye so as to allow for a secondary way for the vitreous hemorrhage to abate but if it does not abate nor clear, vitrectomy can be carried out thereafter assuring that the anterior hyaloid can be removed and thus clearance of the vitreous scaffold and this type 1 diabetes patient ? ?I will refer for evaluation with Cephus Richer, OD in [facility] who could thereafter arrange cataract surgery with a cataract surgeon perhaps Dr. Gweneth Dimitri ?

## 2021-06-11 ENCOUNTER — Ambulatory Visit: Payer: Medicare Other | Admitting: Podiatry

## 2021-06-14 ENCOUNTER — Encounter (INDEPENDENT_AMBULATORY_CARE_PROVIDER_SITE_OTHER): Payer: BC Managed Care – PPO | Admitting: Ophthalmology

## 2021-06-16 ENCOUNTER — Ambulatory Visit (INDEPENDENT_AMBULATORY_CARE_PROVIDER_SITE_OTHER): Payer: PRIVATE HEALTH INSURANCE | Admitting: Podiatry

## 2021-06-16 DIAGNOSIS — E109 Type 1 diabetes mellitus without complications: Secondary | ICD-10-CM

## 2021-06-16 DIAGNOSIS — L97512 Non-pressure chronic ulcer of other part of right foot with fat layer exposed: Secondary | ICD-10-CM | POA: Diagnosis not present

## 2021-06-16 NOTE — Progress Notes (Signed)
Subjective: ?56 year old female presents also for evaluation of wound on the bottom of her right foot, submetatarsal 2.  She states the last couple days she is concerned that the wound may be getting larger.  She states has not been draining much.  She denies any increase in swelling or redness and she does not feel that the wound is infected.  Denies any fevers or chills today.  She has no other concerns today.  ? ? ?Last A1c was 11.6 on April 12, 2021 ? ?Objective: ?AAO x3, NAD ?DP/PT pulses palpable bilaterally, CRT less than 3 seconds ?On the right foot submetatarsal 2 is a granular wound with hyperkeratotic periwound.  There did appear to be more hyperkeratotic tissue today however after debridement the wound is still the same size measuring 1 x 1 x 0.2 cm.  Pre and post wound measurements are the same.  There is no probing to bone or tunneling.  No surrounding erythema, ascending cellulitis.  There is no fluctuation or crepitation.  No malodor. ?No pain with calf compression, swelling, warmth, erythema ? ?Assessment: ?Right foot ulcer ? ?Plan: ?-All treatment options discussed with the patient including all alternatives, risks, complications.  ?-Sharply debrided the wound today utilizing a #312 scalpel without any complications down to healthy, bleeding, granular tissue in order to promote wound healing and to remove nonviable devitalized tissue.  There was minimal blood loss and hemostasis was achieved through manual compression.  The wound was irrigated with saline.  Silvadene was applied followed by dressing.  She tolerated the procedure well and complications.  Continue daily dressing changes with silver alginate ?-I will try to order a foot defender boot as well as the collagen, BastX ?-Continue offloading. ?-Glucose control. ?-Monitor for any clinical signs or symptoms of infection and directed to call the office immediately should any occur or go to the ER. ?-Patient encouraged to call the office  with any questions, concerns, change in symptoms.  ? ?Trula Slade DPM ? ?

## 2021-06-17 ENCOUNTER — Encounter (INDEPENDENT_AMBULATORY_CARE_PROVIDER_SITE_OTHER): Payer: BC Managed Care – PPO | Admitting: Ophthalmology

## 2021-06-21 ENCOUNTER — Telehealth: Payer: Self-pay | Admitting: Podiatry

## 2021-06-21 NOTE — Telephone Encounter (Signed)
Pt called stating she had received a call from [facility] asking her shoe size for a boot that needed to be ordered. ? ?She states she wears a size 7.32M... ?Also she stated that Dr Jacqualyn Posey told her if she had not gotten her supplies today to let him know and pt states she has NOT gotten any supplies yet. ?

## 2021-06-22 ENCOUNTER — Ambulatory Visit (INDEPENDENT_AMBULATORY_CARE_PROVIDER_SITE_OTHER): Payer: BC Managed Care – PPO | Admitting: Ophthalmology

## 2021-06-22 ENCOUNTER — Encounter (INDEPENDENT_AMBULATORY_CARE_PROVIDER_SITE_OTHER): Payer: Self-pay | Admitting: Ophthalmology

## 2021-06-22 DIAGNOSIS — H4312 Vitreous hemorrhage, left eye: Secondary | ICD-10-CM

## 2021-06-22 DIAGNOSIS — E103592 Type 1 diabetes mellitus with proliferative diabetic retinopathy without macular edema, left eye: Secondary | ICD-10-CM

## 2021-06-22 DIAGNOSIS — E103491 Type 1 diabetes mellitus with severe nonproliferative diabetic retinopathy without macular edema, right eye: Secondary | ICD-10-CM | POA: Diagnosis not present

## 2021-06-22 NOTE — Assessment & Plan Note (Signed)
Vitreous hemorrhage left eye still clearing out of the visual axis, now room for completion of PRP ?

## 2021-06-22 NOTE — Assessment & Plan Note (Signed)
Vitreous hemorrhageThe nature of the vitreous hemorrhage was discussed with the patient as well as the common causes.   Patients with diabetic eye disease may develop retinal neovascularization.  Other eye conditions develop retinal  neovascularization secondary to retinal venous occlusions.   ?Vitreous hemorrhage may result from spontaneous vitreous detachment or retinal breaks.  Blunt trauma is a common cause as well.  The need for serial evaluation of the fundus (interior of the eye) until  clear views are obtained was addressed. An occasional need  to monitor the condition by in office  B-scan ultrasonography in the case of dense vitreous hemorrhage was discussed. ?

## 2021-06-22 NOTE — Progress Notes (Signed)
? ? ?06/22/2021 ? ?  ? ?CHIEF COMPLAINT ?Patient presents for  ?Chief Complaint  ?Patient presents with  ? Diabetic Retinopathy without Macular Edema  ? ? ? ? ?HISTORY OF PRESENT ILLNESS: ?[name] [name] is a 56 y.o. female who presents to the clinic today for:  ? ?HPI   ?1 week dilate OD, color fp, PRP. ?Pt states "my vision isn't any better."   OS, WITH persistent large vit debris OS,,Denies new FOL or floaters. ?Last edited by Hurman Horn, MD on 06/22/2021  2:09 PM.  ?  ? ? ?Referring physician: ?Alver Fisher, MD ?[address] ?[address] ?[place],  [place] [address] ? ?HISTORICAL INFORMATION:  ? ?Selected notes from the [facility] ?  ? ?Lab Results  ?Component Value Date  ? HGBA1C 11.6 (H) 04/12/2021  ?  ? ?CURRENT MEDICATIONS: ?No current outpatient medications on file. (Ophthalmic Drugs)  ? ?No current facility-administered medications for this visit. (Ophthalmic Drugs)  ? ?Current Outpatient Medications (Other)  ?Medication Sig  ? acetaminophen (TYLENOL) 325 MG tablet Take 650 mg by mouth every 6 (six) hours as needed for mild pain, fever or headache.  ? dicyclomine (BENTYL) 10 MG capsule Take 10 mg by mouth 4 (four) times daily as needed.  ? gabapentin (NEURONTIN) 300 MG capsule Take 300 mg by mouth 2 (two) times daily.   ? ibuprofen (ADVIL) 200 MG tablet Take 400 mg by mouth every 6 (six) hours as needed for fever, headache or mild pain.  ? insulin glargine (LANTUS) 100 UNIT/ML Solostar Pen Inject 8 Units into the skin daily.  ? insulin lispro (HUMALOG KWIKPEN) 100 UNIT/ML KiwkPen Inject 3-10 Units into the skin 3 (three) times daily.   ? Insulin Pen Needle (B-D ULTRAFINE III SHORT PEN) 31G X 8 MM MISC Inject 1 each into the skin 4 (four) times daily -  before meals and at bedtime.  ? levothyroxine (SYNTHROID) 88 MCG tablet Take 88 mcg by mouth daily.  ? NOVOLIN R FLEXPEN RELION 100 UNIT/ML FlexPen Inject into the skin.  ? omeprazole (PRILOSEC) 20 MG capsule Take 20 mg by mouth daily.   ?  ondansetron (ZOFRAN) 4 MG tablet Take 4 mg by mouth every 8 (eight) hours as needed for nausea.   ? ONETOUCH VERIO test strip USE TO CHECK BLOOD SUGARS FOUR TIMES DAILY  ? oxyCODONE (OXY IR/ROXICODONE) 5 MG immediate release tablet Take 5-10 mg by mouth every 4 (four) hours as needed for pain.  ? ?No current facility-administered medications for this visit. (Other)  ? ? ? ? ?REVIEW OF SYSTEMS: ?ROS   ?Negative for: Constitutional, Gastrointestinal, Neurological, Skin, Genitourinary, Musculoskeletal, HENT, Endocrine, Cardiovascular, Eyes, Respiratory, Psychiatric, Allergic/Imm, Heme/Lymph ?Last edited by Hurman Horn, MD on 06/22/2021  2:09 PM.  ?  ? ? ? ?ALLERGIES ?Allergies  ?Allergen Reactions  ? Lisinopril Cough  ? ? ?PAST MEDICAL HISTORY ?Past Medical History:  ?Diagnosis Date  ? Arthritis   ? Diabetic polyneuropathy ([place])   ? Diabetic polyneuropathy ([place])   ? ?Past Surgical History:  ?Procedure Laterality Date  ? CESAREAN SECTION    ? ? ?FAMILY HISTORY ?History reviewed. No pertinent family history. ? ?SOCIAL HISTORY ?Social History  ? ?Tobacco Use  ? Smoking status: Never  ? Smokeless tobacco: Never  ?Substance Use Topics  ? Alcohol use: Yes  ?  Comment: occ  ? Drug use: Never  ? ?  ? ?  ? ?OPHTHALMIC EXAM: ? ?Base Eye Exam   ? ? Visual  Acuity (ETDRS)   ? ?   Right Left  ? Dist [place] 20/15 -2 20/25  ? ?  ?  ? ? Tonometry (Tonopen, 12:57 PM)   ? ?   Right Left  ? Pressure 13 14  ? ?  ?  ? ? Pupils   ? ?   Pupils Dark Light React APD  ? Right PERRL 5 5 Minimal None  ? Left PERRL 5 5 Minimal None  ? ?  ?  ? ? Visual Fields   ? ? Counting fingers  ? ?  ?  ? ? Extraocular Movement   ? ?   Right Left  ?  Full Full  ? ?  ?  ? ? Neuro/Psych   ? ? Oriented x3: Yes  ? Mood/Affect: Normal  ? ?  ?  ? ? Dilation   ? ? Right eye: 1.0% Mydriacyl, 2.5% Phenylephrine @ 12:57 PM  ? ?  ?  ? ?  ? ?Slit Lamp and Fundus Exam   ? ? External Exam   ? ?   Right Left  ? External Normal Normal  ? ?  ?  ? ? Slit Lamp Exam   ? ?   Right  Left  ? Lids/Lashes Normal Normal  ? Conjunctiva/Sclera White and quiet White and quiet  ? Cornea Clear Clear  ? Anterior Chamber Deep and quiet Deep and quiet  ? Iris Round and reactive Round and reactive, no neovascularization, undilated  ? Lens 2+ Nuclear sclerosis 2+ Nuclear sclerosis  ? Anterior Vitreous Normal Normal  ? ?  ?  ? ? Fundus Exam   ? ?   Right Left  ? Posterior Vitreous Normal   ? Disc Normal   ? C/D Ratio 0.35   ? Macula Normal   ? Vessels NPDR-Severe   ? Periphery Normal, peripheral anterior PRP nasal and temporal quadrants   ? ?  ?  ? ?  ? ? ?IMAGING AND PROCEDURES  ?Imaging and Procedures for 06/22/21 ? ?Color Fundus Photography Optos - OU - Both Eyes   ? ?   ?Right Eye ?Progression has no prior data. Disc findings include normal observations. Macula : microaneurysms.  ? ?Left Eye ?Progression has no prior data. Disc findings include neovascularization. Macula : microaneurysms.  ? ?Notes ?OD with severe NPDR.  Good peripheral PRP nasal and temporal, will need completion superiorly and inferiorly OD ? ?OS, large prior neovascularization of the nerve is now released to traction into a PVD and Weiss ring.  Prior central vitreous opacity from vitreous hemorrhage, has moved inferiorly visual axis and optic nerve and macula are more clearly seen today ? ? ?We will need completion of photocoagulation ultimately temporally as well as nasally in the left eye ? ?  ? ?Panretinal Photocoagulation - OD - Right Eye   ? ?   ?Time Out ?Confirmed correct patient, procedure, site, and patient consented.  ? ?Anesthesia ?Topical anesthesia was used. Anesthetic medications included Proparacaine 0.5%.  ? ?Laser Information ?The type of laser was diode. Color was yellow. The duration in seconds was 0.03. The spot size was 390 microns. Laser power was 280. Total spots was 984.  ? ?Post-op ?The patient tolerated the procedure well. There were no complications. The patient received written and verbal post procedure care  education.  ? ?Notes ?CHRPE delivered anterior to equator superior and at the equator and anterior inferiorly ? ?  ? ? ?  ?  ? ?  ?ASSESSMENT/PLAN: ? ?  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 ?Vitreous hemorrhage of left eye ([place]) ?Vitreous hemorrhageThe nature of the vitreous hemorrhage was discussed with the patient as well as the common causes.   Patients with diabetic eye disease may develop retinal neovascularization.  Other eye conditions develop retinal  neovascularization secondary to retinal venous occlusions.   ?Vitreous hemorrhage may result from spontaneous vitreous detachment or retinal breaks.  Blunt trauma is a common cause as well.  The need for serial evaluation of the fundus (interior of the eye) until  clear views are obtained was addressed. An occasional need  to monitor the condition by in office  B-scan ultrasonography in the case of dense vitreous hemorrhage was discussed.  ? ?  ICD-10-CM   ?1. Severe nonproliferative diabetic retinopathy of right eye, without macular edema, associated with type 1 diabetes mellitus ([place])  [id number] Color Fundus Photography Optos - OU - Both Eyes  ?  Panretinal Photocoagulation - OD - Right Eye  ?  ?2. 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  ?3. Vitreous hemorrhage of left eye (HCC)  H43.12   ?  ? ? ?1.  Prp  #2 completed today for high risk severe NPDR for progression of PDR given the fellow eye clinical course. ? ?2.  OS follow-up vitreous hemorrhage next, will likely complete PRP but if vitreous hemorrhage is the same or worsens could also consider vitrectomy and endolaser as discussed with patient ? ?3.  Continue head of bed elevated for  clearance of hemorrhage OS ? ?Ophthalmic Meds Ordered this visit:  ?No orders of the defined types were placed in this encounter. ? ? ?  ? ?Return for dilate, OS, PRP or if not clearing, consider vitrectomy schedule

## 2021-06-23 ENCOUNTER — Encounter (INDEPENDENT_AMBULATORY_CARE_PROVIDER_SITE_OTHER): Payer: BC Managed Care – PPO | Admitting: Ophthalmology

## 2021-07-02 ENCOUNTER — Ambulatory Visit: Payer: PRIVATE HEALTH INSURANCE | Admitting: Podiatry

## 2021-07-06 ENCOUNTER — Ambulatory Visit (INDEPENDENT_AMBULATORY_CARE_PROVIDER_SITE_OTHER): Payer: BC Managed Care – PPO | Admitting: Podiatry

## 2021-07-06 DIAGNOSIS — E109 Type 1 diabetes mellitus without complications: Secondary | ICD-10-CM

## 2021-07-06 DIAGNOSIS — L97512 Non-pressure chronic ulcer of other part of right foot with fat layer exposed: Secondary | ICD-10-CM

## 2021-07-07 ENCOUNTER — Encounter (INDEPENDENT_AMBULATORY_CARE_PROVIDER_SITE_OTHER): Payer: Self-pay | Admitting: Ophthalmology

## 2021-07-07 ENCOUNTER — Ambulatory Visit (INDEPENDENT_AMBULATORY_CARE_PROVIDER_SITE_OTHER): Payer: BC Managed Care – PPO | Admitting: Ophthalmology

## 2021-07-07 DIAGNOSIS — E103592 Type 1 diabetes mellitus with proliferative diabetic retinopathy without macular edema, left eye: Secondary | ICD-10-CM

## 2021-07-07 DIAGNOSIS — H4312 Vitreous hemorrhage, left eye: Secondary | ICD-10-CM

## 2021-07-07 DIAGNOSIS — H2513 Age-related nuclear cataract, bilateral: Secondary | ICD-10-CM

## 2021-07-07 NOTE — Assessment & Plan Note (Signed)
OS, will need cataract surgery prior to vitrectomy to clear the vitreous hemorrhage. ? ?We will coordinate and schedule Dr. Gweneth Dimitri because of the operative days on the same day. ?

## 2021-07-07 NOTE — Progress Notes (Addendum)
07/07/2021     CHIEF COMPLAINT Patient presents for  Chief Complaint  Patient presents with   Diabetic Retinopathy without Macular Edema      HISTORY OF PRESENT ILLNESS: [name] [name] is a 56 y.o. female who presents to the clinic today for:   HPI   2 weeks (patient out of town following week) for dilate, OS, PRP or if not clearing, consider vitrectomy schedule. Patient states "it is not any better, no improvement. I was under the impression I would be having surgery today. With the cloud over my eye I am not seeing very well it seems." LBS: this morning 156 per patient.    Last edited by Nelva NayKronstein, Anna N on 07/07/2021  9:46 AM.      Referring physician: Ronda FairlyBhusal, Yogesh, MD [address] [place],  [place] [id number]  HISTORICAL INFORMATION:   Selected notes from the MEDICAL RECORD NUMBER    Lab Results  Component Value Date   HGBA1C 11.6 (H) 04/12/2021     CURRENT MEDICATIONS: No current outpatient medications on file. (Ophthalmic Drugs)   No current facility-administered medications for this visit. (Ophthalmic Drugs)   Current Outpatient Medications (Other)  Medication Sig   acetaminophen (TYLENOL) 325 MG tablet Take 650 mg by mouth every 6 (six) hours as needed for mild pain, fever or headache.   dicyclomine (BENTYL) 10 MG capsule Take 10 mg by mouth 4 (four) times daily as needed.   gabapentin (NEURONTIN) 300 MG capsule Take 300 mg by mouth 2 (two) times daily.    ibuprofen (ADVIL) 200 MG tablet Take 400 mg by mouth every 6 (six) hours as needed for fever, headache or mild pain.   insulin glargine (LANTUS) 100 UNIT/ML Solostar Pen Inject 8 Units into the skin daily.   insulin lispro (HUMALOG KWIKPEN) 100 UNIT/ML KiwkPen Inject 3-10 Units into the skin 3 (three) times daily.    Insulin Pen Needle (B-D ULTRAFINE III SHORT PEN) 31G X 8 MM MISC Inject 1 each into the skin 4 (four) times daily -  before meals and at bedtime.   levothyroxine (SYNTHROID)  88 MCG tablet Take 88 mcg by mouth daily.   NOVOLIN R FLEXPEN RELION 100 UNIT/ML FlexPen Inject into the skin.   omeprazole (PRILOSEC) 20 MG capsule Take 20 mg by mouth daily.    ondansetron (ZOFRAN) 4 MG tablet Take 4 mg by mouth every 8 (eight) hours as needed for nausea.    ONETOUCH VERIO test strip USE TO CHECK BLOOD SUGARS FOUR TIMES DAILY   oxyCODONE (OXY IR/ROXICODONE) 5 MG immediate release tablet Take 5-10 mg by mouth every 4 (four) hours as needed for pain.   No current facility-administered medications for this visit. (Other)      REVIEW OF SYSTEMS: ROS   Negative for: Constitutional, Gastrointestinal, Neurological, Skin, Genitourinary, Musculoskeletal, HENT, Endocrine, Cardiovascular, Eyes, Respiratory, Psychiatric, Allergic/Imm, Heme/Lymph Last edited by Edmon Crapeankin, Greig Altergott A, MD on 07/07/2021 10:32 AM.       ALLERGIES Allergies  Allergen Reactions   Lisinopril Cough    PAST MEDICAL HISTORY Past Medical History:  Diagnosis Date   Arthritis    Diabetic polyneuropathy (HCC)    Diabetic polyneuropathy (HCC)    Past Surgical History:  Procedure Laterality Date   CESAREAN SECTION      FAMILY HISTORY History reviewed. No pertinent family history.  SOCIAL HISTORY Social History   Tobacco Use   Smoking status: Never   Smokeless tobacco: Never  Substance Use Topics  Alcohol use: Yes    Comment: occ   Drug use: Never         OPHTHALMIC EXAM:  Base Eye Exam     Visual Acuity (ETDRS)       Right Left   Dist [place] 20/20 -2 20/25 -1         Tonometry (Tonopen, 9:47 AM)       Right Left   Pressure 12 13         Pupils       Dark Light React APD   Right 5 5 fixed None   Left 5 5 fixed None         Extraocular Movement       Right Left    Full Full         Neuro/Psych     Oriented x3: Yes   Mood/Affect: Normal         Dilation     Left eye: 1.0% Mydriacyl, 2.5% Phenylephrine @ 9:47 AM           Slit Lamp and Fundus Exam      External Exam       Right Left   External Normal Normal         Slit Lamp Exam       Right Left   Lids/Lashes Normal Normal   Conjunctiva/Sclera White and quiet White and quiet   Cornea Clear Clear   Anterior Chamber Deep and quiet Deep and quiet   Iris Round and reactive Round and reactive, no neovascularization, undilated   Lens 2+ Nuclear sclerosis 2+ Nuclear sclerosis   Anterior Vitreous Normal Normal         Fundus Exam       Right Left   Posterior Vitreous  Vitreous hemorrhage mostly cleared, thin attenuated, old debris inferior   Disc  Neovascularization is active with tractional forces anterior to posterior with obvious Weiss ring over the posterior pole with hemorrhage on the central posterior hyaloid face likely coming from regression of prior NVD.   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  Vitreous hemorrhage of left eye (HCC) Persistent visual axis vitreous hemorrhage actively impairing patient's activities of daily living on ongoing basis.  Nuclear sclerotic cataract of both eyes OS, will need cataract surgery prior to vitrectomy to clear the vitreous hemorrhage.  We will coordinate and schedule Dr. Gweneth Dimitri because of the operative days on the same day.     ICD-10-CM   1. 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 Vitreous hemorrhage of left eye (HCC)  H43.12     3. Nuclear sclerotic cataract of both eyes  H25.13       1.  OS with persistent vitreous hemorrhage, due to the tractional changes on neovascularization of the disc with  elevated N/VE and NVD.  Good peripheral PRP.  The visual axis will need vitrectomy however there is significant anterior segment clouding from nuclear sclerotic cataract.  2.  We will coordinate with Dr. Nile Riggs for consideration of a combo surgery initially cataract surgery with lens implantation left eye followed at the same OR event with vitrectomy PRP left eye Dr. Luciana Axe  3.  Attempt to schedule on a Wednesday afternoon in the coming 2 to 4 weeks.     Ophthalmic Meds Ordered this visit:  No orders of the defined types were placed in this encounter.      Return , [facility], for  combo cataract surgery IOL implantation left eye Dr. Nile Riggs, followed by vitrectomy PRP, OS.  There are no Patient Instructions on file for this visit.   Explained the diagnoses, plan, and follow up with the patient and they expressed understanding.  Patient expressed understanding of the importance of proper follow up care.   Alford Highland Daking Westervelt M.D. Diseases & Surgery of the Retina and Vitreous [facility] 07/15/21     Abbreviations: M myopia (nearsighted); A astigmatism; H hyperopia (farsighted); P presbyopia; Mrx spectacle prescription;  CTL contact lenses; OD right eye; OS left eye; OU both eyes  XT exotropia; ET esotropia; PEK punctate epithelial keratitis; PEE punctate epithelial erosions; DES dry eye syndrome; MGD meibomian gland dysfunction; ATs artificial tears; PFAT's preservative free artificial tears; NSC nuclear sclerotic cataract; PSC posterior subcapsular cataract; ERM epi-retinal membrane; PVD posterior vitreous detachment; RD retinal detachment; DM diabetes mellitus; DR diabetic retinopathy; NPDR  non-proliferative diabetic retinopathy; PDR proliferative diabetic retinopathy; CSME clinically significant macular edema; DME diabetic macular edema; dbh dot blot hemorrhages; CWS cotton wool spot; POAG primary open angle glaucoma; C/D cup-to-disc ratio; HVF humphrey visual field; GVF goldmann visual field; OCT optical coherence tomography; IOP intraocular pressure; BRVO Branch retinal vein occlusion; CRVO central retinal vein occlusion; CRAO central retinal artery occlusion; BRAO branch retinal artery occlusion; RT retinal tear; SB scleral buckle; PPV pars plana vitrectomy; VH Vitreous hemorrhage; PRP panretinal laser photocoagulation; IVK intravitreal kenalog; VMT vitreomacular traction; MH Macular hole;  NVD neovascularization of the disc; NVE neovascularization elsewhere; AREDS age related eye disease study; ARMD age related macular degeneration; POAG primary open angle glaucoma; EBMD epithelial/anterior basement membrane dystrophy; ACIOL anterior chamber intraocular lens; IOL intraocular lens; PCIOL posterior chamber intraocular lens; Phaco/IOL phacoemulsification with intraocular lens placement; PRK photorefractive keratectomy; LASIK laser assisted in situ keratomileusis; HTN hypertension; DM diabetes mellitus; COPD chronic obstructive pulmonary disease

## 2021-07-07 NOTE — Assessment & Plan Note (Signed)
Progressive and recurrent vitreous hemorrhage in the visual axis emanating from elevated neovascularization of the disc with tractional changes continuing. ?

## 2021-07-07 NOTE — Assessment & Plan Note (Signed)
Persistent visual axis vitreous hemorrhage actively impairing patient's activities of daily living on ongoing basis. ?

## 2021-07-10 NOTE — Progress Notes (Signed)
Subjective: ?56 year old female presents also for evaluation of wound on the bottom of her right foot, submetatarsal 2.  The wound is still present that would be doing somewhat better.  She did get the foot defender boot she has been applying the collagen, BlastX to the wound daily.  She has not seen any increase in swelling, redness or any drainage.  She has no fevers or chills that she reports that she has no other concerns today.  ? ?Last A1c was 11.6 on April 12, 2021 ? ?Objective: ?AAO x3, NAD ?DP/PT pulses palpable bilaterally, CRT less than 3 seconds ?On the right foot submetatarsal 2 is a granular wound with hyperkeratotic periwound.  There was very minimal hyperkeratotic periwound today.  The wound base is granular with 100% granular tissue.  The wound still measures 1 x 1 cm however is more superficial about even with the skin.  There is 1 central area that still probes 0.2 cm but the rest of it is more superficial.  Pre and post wound measurements are the same.  There is no probing, undermining or tunneling.  There is no surrounding erythema, ascending cellulitis.  No fluctuation or crepitation.  There is no malodor. ?No pain with calf compression, swelling, warmth, erythema ? ?Assessment: ?Right foot ulcer ? ?Plan: ?-All treatment options discussed with the patient including all alternatives, risks, complications.  ?-There is no significant hyperkeratotic tissue debrided today and the wound is granular.  I lightly debrided the wound but there is no significant debridement performed today.  I cleaned the wound with saline.  Silvadene was applied followed by dressing but I want her to continue with the collagen, BlastX dressing changes daily as well as continue offloading at all times with the foot defender boot.  If no improvement I discussed with her casting however she may need to going back to the wound care center if not healing.  ?-Glucose control.  ?-Monitor for any clinical signs or symptoms of  infection and directed to call the office immediately should any occur or go to the ER. ? ?No follow-ups on file. ? ?Trula Slade DPM ? ?

## 2021-07-13 ENCOUNTER — Encounter (INDEPENDENT_AMBULATORY_CARE_PROVIDER_SITE_OTHER): Payer: BC Managed Care – PPO | Admitting: Ophthalmology

## 2021-07-20 ENCOUNTER — Ambulatory Visit (INDEPENDENT_AMBULATORY_CARE_PROVIDER_SITE_OTHER): Payer: BC Managed Care – PPO | Admitting: Podiatry

## 2021-07-20 DIAGNOSIS — L97512 Non-pressure chronic ulcer of other part of right foot with fat layer exposed: Secondary | ICD-10-CM

## 2021-07-21 ENCOUNTER — Ambulatory Visit (INDEPENDENT_AMBULATORY_CARE_PROVIDER_SITE_OTHER): Payer: BC Managed Care – PPO

## 2021-07-21 ENCOUNTER — Encounter (INDEPENDENT_AMBULATORY_CARE_PROVIDER_SITE_OTHER): Payer: Self-pay

## 2021-07-21 NOTE — Progress Notes (Signed)
07/21/2021     CHIEF COMPLAINT Patient presents for No chief complaint on file.   HISTORY OF PRESENT ILLNESS: [name] [name] is a 56 y.o. female who presents to the clinic today for:       HISTORICAL INFORMATION:   Selected notes from the MEDICAL RECORD NUMBER    Lab Results  Component Value Date   HGBA1C 11.6 (H) 04/12/2021     CURRENT MEDICATIONS: No current outpatient medications on file. (Ophthalmic Drugs)   No current facility-administered medications for this visit. (Ophthalmic Drugs)   Current Outpatient Medications (Other)  Medication Sig   acetaminophen (TYLENOL) 325 MG tablet Take 650 mg by mouth every 6 (six) hours as needed for mild pain, fever or headache.   dicyclomine (BENTYL) 10 MG capsule Take 10 mg by mouth 4 (four) times daily as needed.   gabapentin (NEURONTIN) 300 MG capsule Take 300 mg by mouth 2 (two) times daily.    ibuprofen (ADVIL) 200 MG tablet Take 400 mg by mouth every 6 (six) hours as needed for fever, headache or mild pain.   insulin glargine (LANTUS) 100 UNIT/ML Solostar Pen Inject 8 Units into the skin daily.   insulin lispro (HUMALOG KWIKPEN) 100 UNIT/ML KiwkPen Inject 3-10 Units into the skin 3 (three) times daily.    Insulin Pen Needle (B-D ULTRAFINE III SHORT PEN) 31G X 8 MM MISC Inject 1 each into the skin 4 (four) times daily -  before meals and at bedtime.   levothyroxine (SYNTHROID) 88 MCG tablet Take 88 mcg by mouth daily.   NOVOLIN R FLEXPEN RELION 100 UNIT/ML FlexPen Inject into the skin.   omeprazole (PRILOSEC) 20 MG capsule Take 20 mg by mouth daily.    ondansetron (ZOFRAN) 4 MG tablet Take 4 mg by mouth every 8 (eight) hours as needed for nausea.    ONETOUCH VERIO test strip USE TO CHECK BLOOD SUGARS FOUR TIMES DAILY   oxyCODONE (OXY IR/ROXICODONE) 5 MG immediate release tablet Take 5-10 mg by mouth every 4 (four) hours as needed for pain.   No current facility-administered medications for this visit. (Other)      ALLERGIES Allergies  Allergen Reactions   Lisinopril Cough    PAST MEDICAL HISTORY Past Medical History:  Diagnosis Date   Arthritis    Diabetic polyneuropathy (HCC)    Diabetic polyneuropathy (HCC)    Past Surgical History:  Procedure Laterality Date   CESAREAN SECTION      FAMILY HISTORY No family history on file.  SOCIAL HISTORY Social History   Tobacco Use   Smoking status: Never   Smokeless tobacco: Never  Substance Use Topics   Alcohol use: Yes    Comment: occ   Drug use: Never         OPHTHALMIC EXAM:  Base Eye Exam     Visual Acuity (ETDRS)       Right Left   Dist [place] 20/25 -2 20/25 -1         Tonometry (Tonopen, 10:51 AM)       Right Left   Pressure  14         Pupils       Pupils APD   Right PERRL None   Left PERRL None         Visual Fields (Counting fingers)       Left Right    Full Full         Extraocular Movement       Right Left  Full Full         Neuro/Psych     Oriented x3: Yes   Mood/Affect: Normal         Dilation     Both eyes: No dilation @ 10:50 AM            IMAGING AND PROCEDURES  Imaging and Procedures for @TODAY @           ASSESSMENT/PLAN:  No diagnosis found.  Ophthalmic Meds Ordered this visit:  No orders of the defined types were placed in this encounter.       Pre-op completed. Operative consent obtained with pre-op eye drops reviewed with [name] and sent via [facility] as needed. Post op instructions reviewed with patient and per patient all questions answered.  Laurin Coder

## 2021-07-28 NOTE — Progress Notes (Signed)
Subjective: 56 year old female presents also for evaluation of wound on the bottom of her right foot, submetatarsal 2.  She feels the wound is filling in and getting somewhat smaller in the depth.  She been wearing the foot defender.  She is continue with collagen, blastX.  She says her blood sugar has been better controlled on a daily basis.  Denies any fevers or chills.   Last A1c was 11.6 on April 12, 2021  Objective: AAO x3, NAD DP/PT pulses palpable bilaterally, CRT less than 3 seconds On the right foot submetatarsal 2 is a granular wound with hyperkeratotic periwound.  There was very minimal hyperkeratotic periwound today.  The wound base is granular with 100% granular tissue.  The wound still measures 1 x 0.9 x 0.1 cm however is more superficial about even with the skin.  Pre and post wound measurements are the same.  No significant hyperkeratotic periwound.  There is no probing, undermining or tunneling.  There is no surrounding erythema, ascending cellulitis.  No fluctuation or crepitation.  There is no malodor. No pain with calf compression, swelling, warmth, erythema  Assessment: Right foot ulcer  Plan: -All treatment options discussed with the patient including all alternatives, risks, complications.  -There is no significant hyperkeratotic tissue debrided today and the wound is granular.  I lightly debrided the wound but there is no significant debridement performed today.  I debrided the wound looking with results, not healthy, bleeding, granular tissue as there was some biofilm over the wound.  Hemostasis sheath or manual compression.  She tolerated the procedure well.  I cleaned the wound with saline.  Silvadene was applied followed by dressing but I want her to continue with the collagen, BlastX dressing changes daily as well as continue offloading at all times with the foot defender boot.  If no improvement I discussed with her casting however she may need to going back to the  wound care center if not healing.  -Glucose control.  -Monitor for any clinical signs or symptoms of infection and directed to call the office immediately should any occur or go to the ER.  No follow-ups on file.  Trula Slade DPM

## 2021-08-04 ENCOUNTER — Encounter (AMBULATORY_SURGERY_CENTER): Payer: BC Managed Care – PPO | Admitting: Ophthalmology

## 2021-08-04 DIAGNOSIS — H4312 Vitreous hemorrhage, left eye: Secondary | ICD-10-CM | POA: Diagnosis not present

## 2021-08-04 DIAGNOSIS — E103592 Type 1 diabetes mellitus with proliferative diabetic retinopathy without macular edema, left eye: Secondary | ICD-10-CM

## 2021-08-05 ENCOUNTER — Ambulatory Visit (INDEPENDENT_AMBULATORY_CARE_PROVIDER_SITE_OTHER): Payer: BC Managed Care – PPO | Admitting: Ophthalmology

## 2021-08-05 ENCOUNTER — Encounter (INDEPENDENT_AMBULATORY_CARE_PROVIDER_SITE_OTHER): Payer: Self-pay | Admitting: Ophthalmology

## 2021-08-05 ENCOUNTER — Encounter (INDEPENDENT_AMBULATORY_CARE_PROVIDER_SITE_OTHER): Payer: Medicare Other | Admitting: Ophthalmology

## 2021-08-05 DIAGNOSIS — H4052X3 Glaucoma secondary to other eye disorders, left eye, severe stage: Secondary | ICD-10-CM | POA: Diagnosis not present

## 2021-08-05 DIAGNOSIS — H2511 Age-related nuclear cataract, right eye: Secondary | ICD-10-CM

## 2021-08-05 DIAGNOSIS — E103592 Type 1 diabetes mellitus with proliferative diabetic retinopathy without macular edema, left eye: Secondary | ICD-10-CM

## 2021-08-05 DIAGNOSIS — Z961 Presence of intraocular lens: Secondary | ICD-10-CM

## 2021-08-05 DIAGNOSIS — H4312 Vitreous hemorrhage, left eye: Secondary | ICD-10-CM

## 2021-08-05 MED ORDER — DORZOLAMIDE HCL-TIMOLOL MAL 2-0.5 % OP SOLN: 1.0000 [drp] | Freq: Two times a day (BID) | OPHTHALMIC | 1 refills | Status: DC

## 2021-08-05 NOTE — Assessment & Plan Note (Signed)
Postop day #1, status post combined cataract extraction with Dr. Rutherford Guys, followed by vitrectomy PRP Endo cautery of NVD, Dr. Zadie Rhine  Retained viscoelastic triggering secondary to coma, viscoelastic use at the time of surgery to maintain anterior chamber shape and prevent iris prolapse.  OS, will need anterior chamber tap 30-gauge needle under sterile conditions.

## 2021-08-05 NOTE — Assessment & Plan Note (Signed)
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 Mark Shapiro). 

## 2021-08-05 NOTE — Progress Notes (Signed)
08/05/2021     CHIEF COMPLAINT Patient presents for  Chief Complaint  Patient presents with   Post-op Follow-up      HISTORY OF PRESENT ILLNESS: [name] [name] is a 56 y.o. female who presents to the clinic today for:   HPI     Post-op Follow-up           Laterality: left eye   Discomfort: pain (Sore, achiness), itching and foreign body sensation.  Negative for discharge   Vision: is blurred at distance         Comments   1 day PO OS SX 08/04/2021, Vitrectomy, Endolaser, left eye (Combo case with Dr. Gershon Crane). Patient reports "it is cloudier, it is worse. It itches in the corner,  it aches a little bit." Patient states she has 3 eye drops from Dr. Kellie Moor office but is not sure the names.      Last edited by Hurman Horn, MD on 08/05/2021  9:21 AM.      Referring physician: Alver Fisher, MD [address]  [place] [address]  HISTORICAL INFORMATION:   Selected notes from the MEDICAL RECORD NUMBER    Lab Results  Component Value Date   HGBA1C 11.6 (H) 04/12/2021     CURRENT MEDICATIONS: Current Outpatient Medications (Ophthalmic Drugs)  Medication Sig   dorzolamide-timolol (COSOPT) 22.3-6.8 MG/ML ophthalmic solution Place 1 drop into the left eye 2 (two) times daily.   No current facility-administered medications for this visit. (Ophthalmic Drugs)   Current Outpatient Medications (Other)  Medication Sig   acetaminophen (TYLENOL) 325 MG tablet Take 650 mg by mouth every 6 (six) hours as needed for mild pain, fever or headache.   dicyclomine (BENTYL) 10 MG capsule Take 10 mg by mouth 4 (four) times daily as needed.   gabapentin (NEURONTIN) 300 MG capsule Take 300 mg by mouth 2 (two) times daily.    ibuprofen (ADVIL) 200 MG tablet Take 400 mg by mouth every 6 (six) hours as needed for fever, headache or mild pain.   insulin glargine (LANTUS) 100 UNIT/ML Solostar Pen Inject 8 Units into the skin daily.   insulin lispro (HUMALOG  KWIKPEN) 100 UNIT/ML KiwkPen Inject 3-10 Units into the skin 3 (three) times daily.    Insulin Pen Needle (B-D ULTRAFINE III SHORT PEN) 31G X 8 MM MISC Inject 1 each into the skin 4 (four) times daily -  before meals and at bedtime.   levothyroxine (SYNTHROID) 88 MCG tablet Take 88 mcg by mouth daily.   NOVOLIN R FLEXPEN RELION 100 UNIT/ML FlexPen Inject into the skin.   omeprazole (PRILOSEC) 20 MG capsule Take 20 mg by mouth daily.    ondansetron (ZOFRAN) 4 MG tablet Take 4 mg by mouth every 8 (eight) hours as needed for nausea.    ONETOUCH VERIO test strip USE TO CHECK BLOOD SUGARS FOUR TIMES DAILY   oxyCODONE (OXY IR/ROXICODONE) 5 MG immediate release tablet Take 5-10 mg by mouth every 4 (four) hours as needed for pain.   No current facility-administered medications for this visit. (Other)      REVIEW OF SYSTEMS: ROS   Negative for: Constitutional, Gastrointestinal, Neurological, Skin, Genitourinary, Musculoskeletal, HENT, Endocrine, Cardiovascular, Eyes, Respiratory, Psychiatric, Allergic/Imm, Heme/Lymph Last edited by Hurman Horn, MD on 08/05/2021  9:21 AM.       ALLERGIES Allergies  Allergen Reactions   Lisinopril Cough    PAST MEDICAL HISTORY Past Medical History:  Diagnosis Date   Arthritis  Diabetic polyneuropathy (HCC)    Diabetic polyneuropathy (Real)    Past Surgical History:  Procedure Laterality Date   CESAREAN SECTION      FAMILY HISTORY History reviewed. No pertinent family history.  SOCIAL HISTORY Social History   Tobacco Use   Smoking status: Never   Smokeless tobacco: Never  Substance Use Topics   Alcohol use: Yes    Comment: occ   Drug use: Never         OPHTHALMIC EXAM:  Base Eye Exam     Visual Acuity (ETDRS)       Right Left   Dist [place] 20/20 20/100 -2   Dist ph [place]  NI         Tonometry (Tonopen, 9:12 AM)       Right Left   Pressure 14 47         Tonometry #2 (Tonopen, 9:13 AM)       Right Left   Pressure  40          Pupils       Pupils Dark Light Shape React APD   Right PERRL 5 5  Minimal None   Left PERRL [employer]. Dilated  Round 9:12 AM None         Visual Fields       Left Right     Full   Restrictions Partial outer superior nasal, inferior nasal deficiencies          Extraocular Movement       Right Left    Full Full         Neuro/Psych     Oriented x3: Yes   Mood/Affect: Normal         Dilation     Left eye: 1.0% Mydriacyl, 2.5% Phenylephrine @ 9:12 AM           Slit Lamp and Fundus Exam     External Exam       Right Left   External Normal Normal         Slit Lamp Exam       Right Left   Lids/Lashes Normal Normal   Conjunctiva/Sclera White and quiet White and quiet   Cornea Clear Clear, sutured wound secure, slight central corneal edema   Anterior Chamber Deep and quiet Deep and quiet   Iris Round and reactive Pharmacologically dilated   Lens 2+ Nuclear sclerosis Centered posterior chamber intraocular lens, in the bag   Anterior Vitreous Normal Normal         Fundus Exam       Right Left   Posterior Vitreous  Clear, vitrectomized, avitric   Disc  Cauterized old elevated  NVD stalks   C/D Ratio  0.35   Macula  Attached   Vessels  PRP 360   Periphery  good peripheral PRP nasal and temporal quadrants            IMAGING AND PROCEDURES  Imaging and Procedures for 08/05/21  Aspiration of Aqueous, Diagnostic - OS - Left Eye       Topical lidocaine 4% placed inferotemporal with a cotton pledget followed thereafter by 1 drop ofloxacin, then tobramycin and then repeated for a total of 4 drops for antibiosis.  Lid speculum applied, 30-gauge needle sterilely injected into the via paracentesis in the inferotemporal quadrant and 0.1 cc of clear fluid removed atraumatically with maintenance of formation of the anterior chamber  No complications  ASSESSMENT/PLAN:  Secondary glaucoma, left, severe stage Postop day  #1, status post combined cataract extraction with Dr. Rutherford Guys, followed by vitrectomy PRP Endo cautery of NVD, Dr. Zadie Rhine  Retained viscoelastic triggering secondary to coma, viscoelastic use at the time of surgery to maintain anterior chamber shape and prevent iris prolapse.  OS, will need anterior chamber tap 30-gauge needle under sterile conditions.    Nuclear sclerotic cataract of right eye Follow-up OD as per Dr. Rutherford Guys  Pseudophakia of left eye OS looks great from the cataract standpoint.  Safety stitch 10-0 nylon was applied temporally to the cataract incision site for safety during the procedure.  Intraocular lens in great position.     ICD-10-CM   1. Secondary glaucoma, left, severe stage  H40.52X3 Aspiration of Aqueous, Diagnostic - OS - Left Eye    2. 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 Nuclear sclerotic cataract of right eye  H25.11     4. Pseudophakia of left eye  Z96.1       1.  OS today looks great, moderate to borderline elevated intraocular pressure secondarily to intraoperative viscoelastic required to stabilize the anterior chamber and prevent iris prolapse during the posterior segment portion of procedure.  Safety stitch also applied in the cornea.    2.  Intraocular pressure normalized left eye post AC tap to soft, not measurable by Tono-Pen yet formed  3.  Ophthalmic Meds Ordered this visit:  Meds ordered this encounter  Medications   dorzolamide-timolol (COSOPT) 22.3-6.8 MG/ML ophthalmic solution    Sig: Place 1 drop into the left eye 2 (two) times daily.    Dispense:  10 mL    Refill:  1       Return in about 1 week (around 08/12/2021) for dilate, POST OP.  Patient Instructions  Patient to commence dorzolamide-timolol combination medication (Cosopt) 1 drop left eye twice daily.  Patient to follow-up scheduled  with Dr. Rutherford Guys in 2 weeks   Explained the diagnoses, plan, and follow up with the patient and they expressed understanding.  Patient expressed understanding of the importance of proper follow up care.   Clent Demark Ulla Mckiernan M.D. Diseases & Surgery of the Retina and Vitreous [facility] 08/05/21     Abbreviations: M myopia (nearsighted); A astigmatism; H hyperopia (farsighted); P presbyopia; Mrx spectacle prescription;  CTL contact lenses; OD right eye; OS left eye; OU both eyes  XT exotropia; ET esotropia; PEK punctate epithelial keratitis; PEE punctate epithelial erosions; DES dry eye syndrome; MGD meibomian gland dysfunction; ATs artificial tears; PFAT's preservative free artificial tears; [place] nuclear sclerotic cataract; PSC posterior subcapsular cataract; ERM epi-retinal membrane; PVD posterior vitreous detachment; RD retinal detachment; DM diabetes mellitus; DR diabetic retinopathy; NPDR non-proliferative diabetic retinopathy; PDR proliferative diabetic retinopathy; CSME clinically significant macular edema; DME diabetic macular edema; dbh dot blot hemorrhages; CWS cotton wool spot; POAG primary open angle glaucoma; C/D cup-to-disc ratio; HVF humphrey visual field; GVF goldmann visual field; OCT optical coherence tomography; IOP intraocular pressure; BRVO Branch retinal vein occlusion; CRVO central retinal vein occlusion; CRAO central retinal artery occlusion; BRAO branch retinal artery occlusion; RT retinal tear; SB scleral buckle; PPV pars plana vitrectomy; VH Vitreous hemorrhage; PRP panretinal laser photocoagulation; IVK intravitreal kenalog; VMT vitreomacular traction; MH Macular hole;  NVD neovascularization of the disc; NVE neovascularization elsewhere; AREDS age related eye disease study; ARMD age related  macular degeneration; POAG primary open angle glaucoma; EBMD epithelial/anterior basement membrane dystrophy; ACIOL anterior chamber intraocular lens; IOL intraocular  lens; PCIOL posterior chamber intraocular lens; Phaco/IOL phacoemulsification with intraocular lens placement; [place] photorefractive keratectomy; LASIK laser assisted in situ keratomileusis; HTN hypertension; DM diabetes mellitus; COPD chronic obstructive pulmonary disease

## 2021-08-05 NOTE — Assessment & Plan Note (Signed)
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 Jethro Bolus).

## 2021-08-05 NOTE — Assessment & Plan Note (Addendum)
OS looks great from the cataract standpoint.  Safety stitch 10-0 nylon was applied temporally to the cataract incision site for safety during the procedure.  Intraocular lens in great position.

## 2021-08-05 NOTE — Patient Instructions (Signed)
Patient to commence dorzolamide-timolol combination medication (Cosopt) 1 drop left eye twice daily.  Patient to follow-up scheduled with Dr. Rutherford Guys in 2 weeks

## 2021-08-05 NOTE — Assessment & Plan Note (Addendum)
Follow-up OD as per Dr. Jethro Bolus

## 2021-08-09 ENCOUNTER — Ambulatory Visit (INDEPENDENT_AMBULATORY_CARE_PROVIDER_SITE_OTHER): Payer: BC Managed Care – PPO | Admitting: Ophthalmology

## 2021-08-09 ENCOUNTER — Encounter (INDEPENDENT_AMBULATORY_CARE_PROVIDER_SITE_OTHER): Payer: Self-pay | Admitting: Ophthalmology

## 2021-08-09 DIAGNOSIS — H4312 Vitreous hemorrhage, left eye: Secondary | ICD-10-CM

## 2021-08-09 DIAGNOSIS — E103592 Type 1 diabetes mellitus with proliferative diabetic retinopathy without macular edema, left eye: Secondary | ICD-10-CM

## 2021-08-09 NOTE — Patient Instructions (Addendum)
Patient instructed in head of bed elevation so as to allow for continued clearance via gravity through the night of vitreous hemorrhage Continue on same topical medications left eye avoid aspirin and Advil if possible  Use Tylenol as needed

## 2021-08-09 NOTE — Progress Notes (Signed)
08/09/2021     CHIEF COMPLAINT Patient presents for  Chief Complaint  Patient presents with   Post-op Follow-up      HISTORY OF PRESENT ILLNESS: [name] [name] is a 56 y.o. female who presents to the clinic today for:   HPI   4 days Dilate OS, PO sx 08/04/2021. Postop, status post combined cataract extraction with Dr. Jethro Bolus, followed by vitrectomy PRP Endo cautery of NVD, Dr. Luciana Axe Patient reports "it is crusty in the corners and I want to scratch it off my face. I am very concerned. It was doing better until Friday and then it went bad on Friday night. Now it is red and swollen." Patient is using drops from Dr. Nile Riggs but does not know the name of the drops and one drop for pressure from Dr. Luciana Axe, Dor[place]- tim BID OS. Last edited by [name] on 08/09/2021  3:45 PM.      Referring physician: Ronda Fairly, MD [address],  [place] [address]  HISTORICAL INFORMATION:   Selected notes from the MEDICAL RECORD NUMBER    Lab Results  Component Value Date   HGBA1C 11.6 (H) 04/12/2021     CURRENT MEDICATIONS: Current Outpatient Medications (Ophthalmic Drugs)  Medication Sig   dorzolamide-timolol (COSOPT) 22.3-6.8 MG/ML ophthalmic solution Place 1 drop into the left eye 2 (two) times daily.   No current facility-administered medications for this visit. (Ophthalmic Drugs)   Current Outpatient Medications (Other)  Medication Sig   acetaminophen (TYLENOL) 325 MG tablet Take 650 mg by mouth every 6 (six) hours as needed for mild pain, fever or headache.   dicyclomine (BENTYL) 10 MG capsule Take 10 mg by mouth 4 (four) times daily as needed.   gabapentin (NEURONTIN) 300 MG capsule Take 300 mg by mouth 2 (two) times daily.    ibuprofen (ADVIL) 200 MG tablet Take 400 mg by mouth every 6 (six) hours as needed for fever, headache or mild pain.   insulin glargine (LANTUS) 100 UNIT/ML Solostar Pen Inject 8 Units into the skin daily.   insulin  lispro (HUMALOG KWIKPEN) 100 UNIT/ML KiwkPen Inject 3-10 Units into the skin 3 (three) times daily.    Insulin Pen Needle (B-D ULTRAFINE III SHORT PEN) 31G X 8 MM MISC Inject 1 each into the skin 4 (four) times daily -  before meals and at bedtime.   levothyroxine (SYNTHROID) 88 MCG tablet Take 88 mcg by mouth daily.   NOVOLIN R FLEXPEN RELION 100 UNIT/ML FlexPen Inject into the skin.   omeprazole (PRILOSEC) 20 MG capsule Take 20 mg by mouth daily.    ondansetron (ZOFRAN) 4 MG tablet Take 4 mg by mouth every 8 (eight) hours as needed for nausea.    ONETOUCH VERIO test strip USE TO CHECK BLOOD SUGARS FOUR TIMES DAILY   oxyCODONE (OXY IR/ROXICODONE) 5 MG immediate release tablet Take 5-10 mg by mouth every 4 (four) hours as needed for pain.   No current facility-administered medications for this visit. (Other)      REVIEW OF SYSTEMS: ROS   Negative for: Constitutional, Gastrointestinal, Neurological, Skin, Genitourinary, Musculoskeletal, HENT, Endocrine, Cardiovascular, Eyes, Respiratory, Psychiatric, Allergic/Imm, Heme/Lymph Last edited by Edmon Crape, MD on 08/09/2021  4:33 PM.       ALLERGIES Allergies  Allergen Reactions   Lisinopril Cough    PAST MEDICAL HISTORY Past Medical History:  Diagnosis Date   Arthritis    Diabetic polyneuropathy (HCC)    Diabetic polyneuropathy (HCC)  Past Surgical History:  Procedure Laterality Date   CESAREAN SECTION      FAMILY HISTORY History reviewed. No pertinent family history.  SOCIAL HISTORY Social History   Tobacco Use   Smoking status: Never   Smokeless tobacco: Never  Substance Use Topics   Alcohol use: Yes    Comment: occ   Drug use: Never         OPHTHALMIC EXAM:  Base Eye Exam     Visual Acuity (ETDRS)       Right Left   Dist [place] 20/20 -1 20/320   Dist ph [place]  20/160 +1         Tonometry (Tonopen, 3:43 PM)       Right Left   Pressure 12 11         Pupils       Pupils Dark Light APD   Right  PERRL 5 5 None   Left PERRL 5 5 None         Visual Fields (Counting fingers)       Left Right    Full Full         Extraocular Movement       Right Left    Full Full         Neuro/Psych     Oriented x3: Yes   Mood/Affect: Normal         Dilation     Left eye: 1.0% Mydriacyl, 2.5% Phenylephrine @ 3:44 PM           Slit Lamp and Fundus Exam     External Exam       Right Left   External Normal Normal         Slit Lamp Exam       Right Left   Lids/Lashes Normal Normal   Conjunctiva/Sclera White and quiet White and quiet   Cornea Clear Clear, sutured wound secure, slight central corneal edema   Anterior Chamber Deep and quiet Deep and quiet   Iris Round and reactive Pharmacologically dilated   Lens 2+ Nuclear sclerosis Centered posterior chamber intraocular lens, in the bag   Anterior Vitreous Normal Normal         Fundus Exam       Right Left   Posterior Vitreous  Clear, vitrectomized, avitric   Disc  Cauterized old elevated  NVD stalks   C/D Ratio  0.35   Macula  Attached   Vessels  PRP 360   Periphery  good peripheral PRP nasal and temporal quadrants            IMAGING AND PROCEDURES  Imaging and Procedures for 08/09/21  OCT, Retina - OU - Both Eyes       Right Eye Central Foveal Thickness: 237.   Notes OS no details through vitreous hemorrhage     Color Fundus Photography Optos - OU - Both Eyes       Right Eye Progression has no prior data. Disc findings include normal observations. Macula : microaneurysms.   Left Eye Progression has worsened.   Notes OD with severe NPDR.  Good peripheral PRP nasal and temporal, will need completion superiorly and inferiorly OD  OS, with diffuse vitreous opacity, good PRP peripherally.  We will need completion of photocoagulation ultimately temporally as well as nasally in the left eye             ASSESSMENT/PLAN:  Vitreous hemorrhage of left eye (HCC) OS mild to  moderate recurrent, likely  evanescent hypertensive episode related with large neovascular fronds although cauterized and had to be left in place on the optic nerve due to the likelihood of causing and triggering central retinal artery occlusion if these were amputated or excised  Heavy lifting bending straining including Valsalva maneuvers were asked to be reported and and treating her constipation if it develops     ICD-10-CM   1. 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 Vitreous hemorrhage of left eye (HCC)  H43.12       1.  2.  3.  Ophthalmic Meds Ordered this visit:  No orders of the defined types were placed in this encounter.      Return in about 9 days (around 08/18/2021) for dilate, OS, possible injection Avastin OCT.  Patient Instructions  Patient instructed in head of bed elevation so as to allow for continued clearance via gravity through the night of vitreous hemorrhage Continue on same topical medications left eye avoid aspirin and Advil if possible  Use Tylenol as needed  Explained the diagnoses, plan, and follow up with the patient and they expressed understanding.  Patient expressed understanding of the importance of proper follow up care.   Alford Highland Dustee Bottenfield M.D. Diseases & Surgery of the Retina and Vitreous [facility] 08/09/21     Abbreviations: M myopia (nearsighted); A astigmatism; H hyperopia (farsighted); P presbyopia; Mrx spectacle prescription;  CTL contact lenses; OD right eye; OS left eye; OU both eyes  XT exotropia; ET esotropia; PEK punctate epithelial keratitis; PEE punctate epithelial erosions; DES dry eye syndrome; MGD meibomian gland dysfunction; ATs artificial tears; PFAT's preservative free artificial tears; NSC nuclear sclerotic  cataract; PSC posterior subcapsular cataract; ERM epi-retinal membrane; PVD posterior vitreous detachment; RD retinal detachment; DM diabetes mellitus; DR diabetic retinopathy; NPDR non-proliferative diabetic retinopathy; PDR proliferative diabetic retinopathy; CSME clinically significant macular edema; DME diabetic macular edema; dbh dot blot hemorrhages; CWS cotton wool spot; POAG primary open angle glaucoma; C/D cup-to-disc ratio; HVF humphrey visual field; GVF goldmann visual field; OCT optical coherence tomography; IOP intraocular pressure; BRVO Branch retinal vein occlusion; CRVO central retinal vein occlusion; CRAO central retinal artery occlusion; BRAO branch retinal artery occlusion; RT retinal tear; SB scleral buckle; PPV pars plana vitrectomy; VH Vitreous hemorrhage; PRP panretinal laser photocoagulation; IVK intravitreal kenalog; VMT vitreomacular traction; MH Macular hole;  NVD neovascularization of the disc; NVE neovascularization elsewhere; AREDS age related eye disease study; ARMD age related macular degeneration; POAG primary open angle glaucoma; EBMD epithelial/anterior basement membrane dystrophy; ACIOL anterior chamber intraocular lens; IOL intraocular lens; PCIOL posterior chamber intraocular lens; Phaco/IOL phacoemulsification with intraocular lens placement; PRK photorefractive keratectomy; LASIK laser assisted in situ keratomileusis; HTN hypertension; DM diabetes mellitus; COPD chronic obstructive pulmonary disease

## 2021-08-09 NOTE — Assessment & Plan Note (Signed)
OS mild to moderate recurrent, likely evanescent hypertensive episode related with large neovascular fronds although cauterized and had to be left in place on the optic nerve due to the likelihood of causing and triggering central retinal artery occlusion if these were amputated or excised  Heavy lifting bending straining including Valsalva maneuvers were asked to be reported and and treating her constipation if it develops

## 2021-08-10 ENCOUNTER — Ambulatory Visit: Payer: Medicare Other | Admitting: Podiatry

## 2021-08-11 ENCOUNTER — Telehealth (INDEPENDENT_AMBULATORY_CARE_PROVIDER_SITE_OTHER): Payer: Self-pay

## 2021-08-11 NOTE — Telephone Encounter (Signed)
Pt called in regard to a question about current eye drops. Pt had a question about taking VISINE drops along with her post-op drops and if it was okay to take them at the same time. Pt stated that her eyes felt itchy, red and yellow.  I called the pt back and asked Dr. Luciana Axe the concerns [name] [name] had. Dr. Luciana Axe advised the pt not to take VISINE or any drop that states "Takes the redness away." As it is deemed not safe. I advised the pt to take preservative-free tear drops or allergy drops OTC. Pt responded well and was compliant. The call was completed. I also advised the pt to stop one of the post-op drops with the tan bottle cap per Dr. Barbaraann Barthel request.

## 2021-08-12 ENCOUNTER — Ambulatory Visit (INDEPENDENT_AMBULATORY_CARE_PROVIDER_SITE_OTHER): Payer: BC Managed Care – PPO | Admitting: Podiatry

## 2021-08-12 ENCOUNTER — Encounter (INDEPENDENT_AMBULATORY_CARE_PROVIDER_SITE_OTHER): Payer: BC Managed Care – PPO | Admitting: Ophthalmology

## 2021-08-12 DIAGNOSIS — L97512 Non-pressure chronic ulcer of other part of right foot with fat layer exposed: Secondary | ICD-10-CM

## 2021-08-12 DIAGNOSIS — E1049 Type 1 diabetes mellitus with other diabetic neurological complication: Secondary | ICD-10-CM

## 2021-08-14 NOTE — Progress Notes (Signed)
Subjective: 56 year old female presents also for evaluation of wound on the bottom of her right foot, submetatarsal 2.  She feels the wound is filling in and getting somewhat smaller in the depth.  She states the wounds been doing much better this past couple weeks.  She is continue with daily dressing changes with the collagen, BlastX as well as using the foot defender boot.  She denies any fevers or chills.  No other concerns.   Last A1c was 11.6 on April 12, 2021  Objective: AAO x3, NAD DP/PT pulses palpable bilaterally, CRT less than 3 seconds On the right foot submetatarsal 2 is a granular wound with hyperkeratotic periwound.  The wound now is getting smaller is actually 2 smaller wounds as opposed to one larger wound.  Picture below but there is no probing, and or tunneling.  Minimal hyperkeratotic periwound.  There is no significant drainage or pus.  No fluctuation or crepitation.  There is no malodor. No pain with calf compression, swelling, warmth, erythema     Assessment: Right foot ulcer  Plan: -All treatment options discussed with the patient including all alternatives, risks, complications.  -I lightly debrided the hyperkeratotic periwound to the any complications or bleeding.  Clean the area and a small mount of Silvadene was applied followed by dressing.  I like for her to continue with the collagen, BlastX and continue offloading the foot defender boot. -Glucose control. -Monitor for any clinical signs or symptoms of infection and directed to call the office immediately should any occur or go to the ER.  Return in about 3 weeks (around 09/02/2021) for ulcer.  [name] DPM

## 2021-08-18 ENCOUNTER — Ambulatory Visit (INDEPENDENT_AMBULATORY_CARE_PROVIDER_SITE_OTHER): Payer: BC Managed Care – PPO | Admitting: Ophthalmology

## 2021-08-18 ENCOUNTER — Encounter (INDEPENDENT_AMBULATORY_CARE_PROVIDER_SITE_OTHER): Payer: Self-pay | Admitting: Ophthalmology

## 2021-08-18 DIAGNOSIS — E103512 Type 1 diabetes mellitus with proliferative diabetic retinopathy with macular edema, left eye: Secondary | ICD-10-CM

## 2021-08-18 DIAGNOSIS — H4052X3 Glaucoma secondary to other eye disorders, left eye, severe stage: Secondary | ICD-10-CM

## 2021-08-18 DIAGNOSIS — H4312 Vitreous hemorrhage, left eye: Secondary | ICD-10-CM

## 2021-08-18 DIAGNOSIS — E103592 Type 1 diabetes mellitus with proliferative diabetic retinopathy without macular edema, left eye: Secondary | ICD-10-CM

## 2021-08-18 MED ORDER — BEVACIZUMAB 2.5 MG/0.1ML IZ SOSY
2.5000 mg | PREFILLED_SYRINGE | INTRAVITREAL | Status: AC | PRN
  Administered 2021-08-18: 2.5 mg via INTRAVITREAL

## 2021-08-18 NOTE — Progress Notes (Signed)
08/18/2021     CHIEF COMPLAINT Patient presents for  Chief Complaint  Patient presents with   Diabetic Retinopathy without Macular Edema      HISTORY OF PRESENT ILLNESS: [name] [name] is a 56 y.o. female who presents to the clinic today for:   HPI   9 days dilate OS, Possible Avastin injection OS, OCT. Patient reports she thinks her vision has improved but states there is still a film over her left eye. Denies Pain or new FOL or floaters. LBS: 112 Patient uses dorz-tim BID OS, Patient is also using 2 other drops from Dr. Nile Riggs TID OS. Last edited by [name] on 08/18/2021  2:10 PM.      Referring physician: Ronda Fairly, MD [address]  [place] [address]  HISTORICAL INFORMATION:   Selected notes from the MEDICAL RECORD NUMBER    Lab Results  Component Value Date   HGBA1C 11.6 (H) 04/12/2021     CURRENT MEDICATIONS: Current Outpatient Medications (Ophthalmic Drugs)  Medication Sig   dorzolamide-timolol (COSOPT) 22.3-6.8 MG/ML ophthalmic solution Place 1 drop into the left eye 2 (two) times daily.   No current facility-administered medications for this visit. (Ophthalmic Drugs)   Current Outpatient Medications (Other)  Medication Sig   acetaminophen (TYLENOL) 325 MG tablet Take 650 mg by mouth every 6 (six) hours as needed for mild pain, fever or headache.   dicyclomine (BENTYL) 10 MG capsule Take 10 mg by mouth 4 (four) times daily as needed.   gabapentin (NEURONTIN) 300 MG capsule Take 300 mg by mouth 2 (two) times daily.    ibuprofen (ADVIL) 200 MG tablet Take 400 mg by mouth every 6 (six) hours as needed for fever, headache or mild pain.   insulin glargine (LANTUS) 100 UNIT/ML Solostar Pen Inject 8 Units into the skin daily.   insulin lispro (HUMALOG KWIKPEN) 100 UNIT/ML KiwkPen Inject 3-10 Units into the skin 3 (three) times daily.    Insulin Pen Needle (B-D ULTRAFINE III SHORT PEN) 31G X 8 MM MISC Inject 1 each into the  skin 4 (four) times daily -  before meals and at bedtime.   levothyroxine (SYNTHROID) 88 MCG tablet Take 88 mcg by mouth daily.   NOVOLIN R FLEXPEN RELION 100 UNIT/ML FlexPen Inject into the skin.   omeprazole (PRILOSEC) 20 MG capsule Take 20 mg by mouth daily.    ondansetron (ZOFRAN) 4 MG tablet Take 4 mg by mouth every 8 (eight) hours as needed for nausea.    ONETOUCH VERIO test strip USE TO CHECK BLOOD SUGARS FOUR TIMES DAILY   oxyCODONE (OXY IR/ROXICODONE) 5 MG immediate release tablet Take 5-10 mg by mouth every 4 (four) hours as needed for pain.   No current facility-administered medications for this visit. (Other)      REVIEW OF SYSTEMS: ROS   Negative for: Constitutional, Gastrointestinal, Neurological, Skin, Genitourinary, Musculoskeletal, HENT, Endocrine, Cardiovascular, Eyes, Respiratory, Psychiatric, Allergic/Imm, Heme/Lymph Last edited by Edmon Crape, MD on 08/18/2021  2:57 PM.       ALLERGIES Allergies  Allergen Reactions   Lisinopril Cough    PAST MEDICAL HISTORY Past Medical History:  Diagnosis Date   Arthritis    Diabetic polyneuropathy (HCC)    Diabetic polyneuropathy (HCC)    Past Surgical History:  Procedure Laterality Date   CESAREAN SECTION      FAMILY HISTORY History reviewed. No pertinent family history.  SOCIAL HISTORY Social History   Tobacco Use   Smoking  status: Never   Smokeless tobacco: Never  Substance Use Topics   Alcohol use: Yes    Comment: occ   Drug use: Never         OPHTHALMIC EXAM:  Base Eye Exam     Visual Acuity (ETDRS)       Right Left   Dist [place] 20/20 20/30 -2   Dist ph [place]  NI         Tonometry (Tonopen, 2:12 PM)       Right Left   Pressure 17 13         Pupils       Pupils APD   Right PERRL None   Left PERRL None         Extraocular Movement       Right Left    Full Full         Neuro/Psych     Oriented x3: Yes   Mood/Affect: Normal         Dilation     Left eye: 1.0%  Mydriacyl, 2.5% Phenylephrine @ 2:12 PM           Slit Lamp and Fundus Exam     External Exam       Right Left   External Normal Normal         Slit Lamp Exam       Right Left   Lids/Lashes Normal Normal   Conjunctiva/Sclera White and quiet White and quiet   Cornea Clear Clear, sutured wound secure, slight central corneal edema   Anterior Chamber Deep and quiet Deep and quiet   Iris Round and reactive Pharmacologically dilated   Lens 2+ Nuclear sclerosis Centered posterior chamber intraocular lens, in the bag   Anterior Vitreous Normal Normal         Fundus Exam       Right Left   Posterior Vitreous  Trace haze ,vitrectomized, avitric   Disc  Cauterized old elevated  NVD stalks   C/D Ratio  0.35   Macula  Attached   Vessels  PRP 360   Periphery  good peripheral PRP  360            IMAGING AND PROCEDURES  Imaging and Procedures for 08/18/21  Intravitreal Injection, Pharmacologic Agent - OS - Left Eye       Time Out 08/18/2021. 2:59 PM. Confirmed correct patient, procedure, site, and patient consented.   Anesthesia Topical anesthesia was used. Anesthetic medications included Lidocaine 4%.   Procedure Preparation included 5% betadine to ocular surface, 10% betadine to eyelids, Tobramycin 0.3%. A 30 gauge needle was used.   Injection: 2.5 mg bevacizumab 2.5 MG/0.1ML   Route: Intravitreal, Site: Left Eye   NDC: [phone]   Post-op Post injection exam found visual acuity of at least counting fingers. The patient tolerated the procedure well. There were no complications. The patient received written and verbal post procedure care education. Post injection medications were not given.   Notes Injection #1 intravitreal Avastin OS today for control of severe NPDR and PDR OS     OCT, Retina - OU - Both Eyes       Right Eye Central Foveal Thickness: 240.   Left Eye Central Foveal Thickness: 224.   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  With minor diffuse hemorrhage, in order to prevent recurrence, will deliver intravitreal Avastin OS today     ICD-10-CM   1. 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 Secondary glaucoma, left, severe stage  H40.52X3     3. Vitreous hemorrhage of left eye (HCC)  H43.12       1.  OS clearing vision.  Mild trace haze.  In order to control PDR and prevent ongoing hemorrhaging from residual stocks of NVE that could not be resected but were cauterized, will deliver intravitreal Avastin to further clear the hemorrhage long-term  2.  Follow-up next with dilation OU  3.  Ophthalmic Meds Ordered this visit:  Meds ordered this encounter  Medications   bevacizumab (AVASTIN) SOSY 2.5 mg       Return in about 6 weeks (around 09/29/2021) for DILATE OU, OCT possible Avastin OS.  There are no Patient Instructions on file for this visit.   Explained the diagnoses, plan, and follow up with the patient and they expressed understanding.  Patient expressed understanding of the importance of proper follow up care.   Alford Highland Kenyette Gundy M.D. Diseases & Surgery of the Retina and Vitreous [facility] 08/18/21     Abbreviations: M myopia (nearsighted); A astigmatism; H hyperopia (farsighted); P presbyopia; Mrx spectacle prescription;  CTL contact lenses; OD right eye; OS left eye; OU both eyes  XT exotropia; ET esotropia; PEK punctate epithelial keratitis; PEE punctate epithelial erosions; DES dry eye syndrome; MGD meibomian gland dysfunction; ATs artificial tears; PFAT's preservative free artificial tears; NSC nuclear sclerotic cataract; PSC posterior subcapsular cataract; ERM epi-retinal membrane; PVD posterior vitreous detachment; RD retinal detachment; DM diabetes mellitus; DR diabetic retinopathy; NPDR non-proliferative diabetic retinopathy; PDR proliferative diabetic retinopathy; CSME clinically significant macular edema; DME diabetic macular edema; dbh dot blot hemorrhages; CWS cotton wool spot; POAG primary open angle glaucoma; C/D cup-to-disc ratio; HVF humphrey visual field; GVF goldmann visual field; OCT optical coherence tomography; IOP intraocular pressure; BRVO Branch retinal vein occlusion; CRVO central retinal vein occlusion; CRAO central retinal artery occlusion; BRAO branch retinal artery occlusion; RT retinal tear; SB scleral buckle; PPV pars plana vitrectomy; VH Vitreous hemorrhage; PRP panretinal laser photocoagulation; IVK intravitreal kenalog; VMT vitreomacular traction; MH Macular hole;  NVD neovascularization of the disc; NVE neovascularization elsewhere; AREDS age related eye disease study; ARMD age related macular degeneration; POAG primary open angle glaucoma; EBMD epithelial/anterior basement membrane dystrophy; ACIOL anterior chamber intraocular lens; IOL intraocular lens; PCIOL posterior chamber intraocular lens; Phaco/IOL phacoemulsification with intraocular lens placement; PRK photorefractive keratectomy; LASIK laser assisted in situ keratomileusis; HTN hypertension; DM diabetes mellitus; COPD chronic obstructive pulmonary disease

## 2021-08-18 NOTE — Assessment & Plan Note (Signed)
Stabilized, resolved

## 2021-08-18 NOTE — Assessment & Plan Note (Signed)
OS, much improved today with good PRP peripherally, hemorrhage and medial opacity clearing nicely  Nonetheless to keep PDR from active hemorrhaging will deliver intravitreal Avastin OS today

## 2021-08-18 NOTE — Assessment & Plan Note (Signed)
OS clearing nicely and rapidly.  With minor diffuse hemorrhage, in order to prevent recurrence, will deliver intravitreal Avastin OS today

## 2021-09-02 ENCOUNTER — Other Ambulatory Visit: Payer: Medicare Other

## 2021-09-07 ENCOUNTER — Other Ambulatory Visit: Payer: Medicare Other

## 2021-09-09 ENCOUNTER — Ambulatory Visit (INDEPENDENT_AMBULATORY_CARE_PROVIDER_SITE_OTHER): Payer: BC Managed Care – PPO | Admitting: Podiatry

## 2021-09-09 DIAGNOSIS — L97512 Non-pressure chronic ulcer of other part of right foot with fat layer exposed: Secondary | ICD-10-CM | POA: Diagnosis not present

## 2021-09-09 DIAGNOSIS — E1049 Type 1 diabetes mellitus with other diabetic neurological complication: Secondary | ICD-10-CM

## 2021-09-09 MED ORDER — AMOXICILLIN-POT CLAVULANATE 875-125 MG PO TABS: 1.0000 | ORAL_TABLET | Freq: Two times a day (BID) | ORAL | 0 refills | Status: DC

## 2021-09-12 NOTE — Progress Notes (Signed)
Subjective: 56 year old female presents also for evaluation of wound on the bottom of her right foot, submetatarsal 2.  She states that she is doing much better and the wound looks like it is almost healed.  She has not seen any drainage or pus.  No increase in swelling or redness.  She has no pain she has had neuropathy.  She changes the dressing twice a day.  No fevers or chills.  No other concerns.    Last A1c was 11.6 on April 12, 2021  Objective: AAO x3, NAD DP/PT pulses palpable bilaterally, CRT less than 3 seconds On the right foot submetatarsal 2 is a granular wound with hyperkeratotic periwound.  The wound is much more superficial and appears to be almost healed.  Also much smaller in size.  There is no probing to bone or tunneling.  There is no surrounding erythema cellulitis.  No fluctuance or crepitation but was noted. No pain with calf compression, swelling, warmth, erythema  (Picture did not save)   Assessment: Right foot ulcer- improving  Plan: -All treatment options discussed with the patient including all alternatives, risks, complications.  -I lightly debrided the hyperkeratotic periwound with any complications.  Minimal bleeding occurred.  Hemostasis is achieved.  There was cleaned and Silvadene was applied followed by dressing.  Continue daily dressing changes and continue offloading at all times.   -She has an upcoming trip out of the country scheduled for next week.  Discussed keeping area clean and dry.  I prescribed antibiotics for her to take with her just in the event that she needs it otherwise would hold off on them.   -Monitor for any clinical signs or symptoms of infection and directed to call the office immediately should any occur or go to the ER.  [name] DPM

## 2021-09-29 ENCOUNTER — Other Ambulatory Visit (INDEPENDENT_AMBULATORY_CARE_PROVIDER_SITE_OTHER): Payer: Self-pay | Admitting: Ophthalmology

## 2021-09-30 ENCOUNTER — Ambulatory Visit (INDEPENDENT_AMBULATORY_CARE_PROVIDER_SITE_OTHER): Payer: BC Managed Care – PPO | Admitting: Ophthalmology

## 2021-09-30 ENCOUNTER — Ambulatory Visit: Payer: BC Managed Care – PPO | Admitting: Podiatry

## 2021-09-30 ENCOUNTER — Encounter (INDEPENDENT_AMBULATORY_CARE_PROVIDER_SITE_OTHER): Payer: BC Managed Care – PPO | Admitting: Ophthalmology

## 2021-09-30 ENCOUNTER — Encounter (INDEPENDENT_AMBULATORY_CARE_PROVIDER_SITE_OTHER): Payer: Self-pay | Admitting: Ophthalmology

## 2021-09-30 DIAGNOSIS — H4312 Vitreous hemorrhage, left eye: Secondary | ICD-10-CM | POA: Diagnosis not present

## 2021-09-30 DIAGNOSIS — E103592 Type 1 diabetes mellitus with proliferative diabetic retinopathy without macular edema, left eye: Secondary | ICD-10-CM

## 2021-09-30 DIAGNOSIS — E103552 Type 1 diabetes mellitus with stable proliferative diabetic retinopathy, left eye: Secondary | ICD-10-CM | POA: Diagnosis not present

## 2021-09-30 DIAGNOSIS — E103491 Type 1 diabetes mellitus with severe nonproliferative diabetic retinopathy without macular edema, right eye: Secondary | ICD-10-CM

## 2021-09-30 DIAGNOSIS — H2511 Age-related nuclear cataract, right eye: Secondary | ICD-10-CM

## 2021-09-30 DIAGNOSIS — E103512 Type 1 diabetes mellitus with proliferative diabetic retinopathy with macular edema, left eye: Secondary | ICD-10-CM

## 2021-09-30 NOTE — Assessment & Plan Note (Signed)
OD moderate NSC changes.  Okay to proceed with cataract surgery and lens implantation to balance with the pseudophakic left eye at any time or if patient has symptoms.

## 2021-09-30 NOTE — Assessment & Plan Note (Signed)
OS looks great.  Completely clear vitreous hemorrhage.  Completely involutional quiescent PDR.  Will not need to repeat the injection into vegF OS today.  I reassured the patient that should

## 2021-09-30 NOTE — Assessment & Plan Note (Signed)
OD status post PRP 2 sessions, region temporal anterior as well as inferiorly.  Thus relatively protected against rapid progression to PDR in the right eye.  Nonetheless we will continue to monitor and if signs of hemorrhage develop, we will treat the remainder of the peripheral PRP nasally

## 2021-09-30 NOTE — Progress Notes (Signed)
09/30/2021     CHIEF COMPLAINT Patient presents for  Chief Complaint  Patient presents with   Diabetic Retinopathy without Macular Edema      HISTORY OF PRESENT ILLNESS: [name] [name] is a 56 y.o. female who presents to the clinic today for:   HPI   6 weeks for DILATE OU, OCT, POSSIBLE AVASTIN OCT. Pt stated vision remained stable since last visit.  Last edited by Angeline Slim on 09/30/2021  1:33 PM.      Referring physician: Jethro Bolus, MD [address] [place],  [place] [address]  HISTORICAL INFORMATION:   Selected notes from the MEDICAL RECORD NUMBER    Lab Results  Component Value Date   HGBA1C 11.6 (H) 04/12/2021     CURRENT MEDICATIONS: Current Outpatient Medications (Ophthalmic Drugs)  Medication Sig   dorzolamide-timolol (COSOPT) 22.3-6.8 MG/ML ophthalmic solution INSTILL 1 DROP INTO LEFT EYE TWICE A DAY   No current facility-administered medications for this visit. (Ophthalmic Drugs)   Current Outpatient Medications (Other)  Medication Sig   acetaminophen (TYLENOL) 325 MG tablet Take 650 mg by mouth every 6 (six) hours as needed for mild pain, fever or headache.   amoxicillin-clavulanate (AUGMENTIN) 875-125 MG tablet Take 1 tablet by mouth 2 (two) times daily.   dicyclomine (BENTYL) 10 MG capsule Take 10 mg by mouth 4 (four) times daily as needed.   gabapentin (NEURONTIN) 300 MG capsule Take 300 mg by mouth 2 (two) times daily.    ibuprofen (ADVIL) 200 MG tablet Take 400 mg by mouth every 6 (six) hours as needed for fever, headache or mild pain.   insulin glargine (LANTUS) 100 UNIT/ML Solostar Pen Inject 8 Units into the skin daily.   insulin lispro (HUMALOG KWIKPEN) 100 UNIT/ML KiwkPen Inject 3-10 Units into the skin 3 (three) times daily.    Insulin Pen Needle (B-D ULTRAFINE III SHORT PEN) 31G X 8 MM MISC Inject 1 each into the skin 4 (four) times daily -  before meals and at bedtime.   levothyroxine (SYNTHROID) 88 MCG tablet Take 88 mcg by mouth  daily.   NOVOLIN R FLEXPEN RELION 100 UNIT/ML FlexPen Inject into the skin.   omeprazole (PRILOSEC) 20 MG capsule Take 20 mg by mouth daily.    ondansetron (ZOFRAN) 4 MG tablet Take 4 mg by mouth every 8 (eight) hours as needed for nausea.    ONETOUCH VERIO test strip USE TO CHECK BLOOD SUGARS FOUR TIMES DAILY   oxyCODONE (OXY IR/ROXICODONE) 5 MG immediate release tablet Take 5-10 mg by mouth every 4 (four) hours as needed for pain.   No current facility-administered medications for this visit. (Other)      REVIEW OF SYSTEMS: ROS   Negative for: Constitutional, Gastrointestinal, Neurological, Skin, Genitourinary, Musculoskeletal, HENT, Endocrine, Cardiovascular, Eyes, Respiratory, Psychiatric, Allergic/Imm, Heme/Lymph Last edited by Angeline Slim on 09/30/2021  1:33 PM.       ALLERGIES Allergies  Allergen Reactions   Lisinopril Cough    PAST MEDICAL HISTORY Past Medical History:  Diagnosis Date   Arthritis    Diabetic polyneuropathy (HCC)    Diabetic polyneuropathy (HCC)    Past Surgical History:  Procedure Laterality Date   CESAREAN SECTION      FAMILY HISTORY History reviewed. No pertinent family history.  SOCIAL HISTORY Social History   Tobacco Use   Smoking status: Never   Smokeless tobacco: Never  Substance Use Topics   Alcohol use: Yes    Comment: occ   Drug use: Never  OPHTHALMIC EXAM:  Base Eye Exam     Visual Acuity (ETDRS)       Right Left   Dist [place] 20/25 -1 20/25 -2         Tonometry (Tonopen, 1:37 PM)       Right Left   Pressure 14 12         Pupils       Pupils APD   Right PERRL None   Left PERRL None         Visual Fields       Left Right    Full Full         Extraocular Movement       Right Left    Full Full         Neuro/Psych     Oriented x3: Yes   Mood/Affect: Normal         Dilation     Both eyes: 1.0% Mydriacyl, 2.5% Phenylephrine @ 1:37 PM           Slit Lamp and Fundus Exam      External Exam       Right Left   External Normal Normal         Slit Lamp Exam       Right Left   Lids/Lashes Normal Normal   Conjunctiva/Sclera White and quiet White and quiet   Cornea Clear Clear, sutured wound secure, slight central corneal edema   Anterior Chamber Deep and quiet Deep and quiet   Iris Round and reactive Pharmacologically dilated   Lens 2+ Nuclear sclerosis Centered posterior chamber intraocular lens, in the bag   Anterior Vitreous Normal Normal         Fundus Exam       Right Left   Posterior Vitreous  Trace haze ,vitrectomized, avitric   Disc  Cauterized old elevated  NVD stalks   C/D Ratio  0.35   Macula  Attached   Vessels  PRP 360   Periphery  good peripheral PRP  360            IMAGING AND PROCEDURES  Imaging and Procedures for 09/30/21  OCT, Retina - OU - Both Eyes       Right Eye Central Foveal Thickness: 242. Progression has been stable.   Left Eye Central Foveal Thickness: 228. Progression has improved.   Notes OS, diffuse atrophy Minor epiretinal membrane much clearer in media today OS             ASSESSMENT/PLAN:  Vitreous hemorrhage of left eye (HCC) Mild postop vitreous hemorrhage from residual stalk of large vascular tissue on the optic nerve which could not be resected safely at the time of surgery.  This is clearly atrophic and nonvascularized at this point today.  After most recent injection into vegF.  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  Completely clear vitreous hemorrhage.  Completely involutional quiescent PDR.  Will not need to repeat the injection into vegF OS today.  I reassured the patient that should  Severe nonproliferative diabetic retinopathy of right eye, without macular edema, associated with type 1 diabetes mellitus (HCC) OD status post PRP 2 sessions, region  temporal anterior as well as inferiorly.  Thus relatively protected against rapid progression to PDR in the right eye.  Nonetheless we will continue to monitor and if signs of hemorrhage develop, we will treat the  remainder of the peripheral PRP nasally  Nuclear sclerotic cataract of right eye OD moderate NSC changes.  Okay to proceed with cataract surgery and lens implantation to balance with the pseudophakic left eye at any time or if patient has symptoms.       ICD-10-CM   1. Proliferative diabetic retinopathy of left eye without macular edema determined by examination associated with type 1 diabetes mellitus (HCC)  E10.3592 OCT, Retina - OU - Both Eyes    CANCELED: Intravitreal Injection, Pharmacologic Agent - OS - Left Eye    2. Vitreous hemorrhage of left eye (HCC)  H43.12     3. Stable treated proliferative diabetic retinopathy of left eye with macular edema determined by examination associated with type 1 diabetes mellitus (HCC)  X54.0086    P61.9509     4. Severe nonproliferative diabetic retinopathy of right eye, without macular edema, associated with type 1 diabetes mellitus (HCC)  E10.3491     5. Nuclear sclerotic cataract of right eye  H25.11       1.  OS looks great vitreous hemorrhage completely cleared post vitrectomy but also post minor postop vitreous hemorrhage.  PDR OS is now involutional and quiescent.  2.  Very NPDR OD with moderate scatter PRP in place not likely to progress to rapid PDR.  3.  Follow-up with Dr. Jethro Bolus as scheduled or at any time should she choose to proceed with balancing the right eye with cataract surgery and lens implantation to balance with the left eye  Ophthalmic Meds Ordered this visit:  No orders of the defined types were placed in this encounter.      Return in about 8 weeks (around 11/25/2021) for DILATE OU, COLOR FP, OCT.  There are no Patient Instructions on file for this visit.   Explained the diagnoses, plan, and  follow up with the patient and they expressed understanding.  Patient expressed understanding of the importance of proper follow up care.   Alford Highland Cesar Rogerson M.D. Diseases & Surgery of the Retina and Vitreous [facility] 09/30/21     Abbreviations: M myopia (nearsighted); A astigmatism; H hyperopia (farsighted); P presbyopia; Mrx spectacle prescription;  CTL contact lenses; OD right eye; OS left eye; OU both eyes  XT exotropia; ET esotropia; PEK punctate epithelial keratitis; PEE punctate epithelial erosions; DES dry eye syndrome; MGD meibomian gland dysfunction; ATs artificial tears; PFAT's preservative free artificial tears; NSC nuclear sclerotic cataract; PSC posterior subcapsular cataract; ERM epi-retinal membrane; PVD posterior vitreous detachment; RD retinal detachment; DM diabetes mellitus; DR diabetic retinopathy; NPDR non-proliferative diabetic retinopathy; PDR proliferative diabetic retinopathy; CSME clinically significant macular edema; DME diabetic macular edema; dbh dot blot hemorrhages; CWS cotton wool spot; POAG primary open angle glaucoma; C/D cup-to-disc ratio; HVF humphrey visual field; GVF goldmann visual field; OCT optical coherence tomography; IOP intraocular pressure; BRVO Branch retinal vein occlusion; CRVO central retinal vein occlusion; CRAO central retinal artery occlusion; BRAO branch retinal artery occlusion; RT retinal tear; SB scleral buckle; PPV pars plana vitrectomy; VH Vitreous hemorrhage; PRP panretinal laser photocoagulation; IVK intravitreal kenalog; VMT vitreomacular traction; MH Macular hole;  NVD neovascularization of the disc; NVE neovascularization elsewhere; AREDS age related eye disease study; ARMD age related macular degeneration; POAG primary open angle glaucoma; EBMD epithelial/anterior basement membrane dystrophy; ACIOL anterior chamber intraocular lens; IOL intraocular lens; PCIOL posterior chamber intraocular lens; Phaco/IOL  phacoemulsification with intraocular lens placement; PRK photorefractive keratectomy; LASIK laser assisted in situ keratomileusis; HTN hypertension; DM diabetes mellitus; COPD  chronic obstructive pulmonary disease

## 2021-09-30 NOTE — Assessment & Plan Note (Signed)
Mild postop vitreous hemorrhage from residual stalk of large vascular tissue on the optic nerve which could not be resected safely at the time of surgery.  This is clearly atrophic and nonvascularized at this point today.  After most recent injection into vegF.

## 2021-11-05 ENCOUNTER — Ambulatory Visit: Payer: Medicare Other | Admitting: Podiatry

## 2021-11-25 ENCOUNTER — Encounter (INDEPENDENT_AMBULATORY_CARE_PROVIDER_SITE_OTHER): Payer: BC Managed Care – PPO | Admitting: Ophthalmology

## 2021-12-14 ENCOUNTER — Ambulatory Visit: Payer: Medicare Other | Admitting: Podiatry

## 2021-12-16 ENCOUNTER — Ambulatory Visit: Payer: Medicare Other | Admitting: Podiatry

## 2022-01-03 ENCOUNTER — Ambulatory Visit (INDEPENDENT_AMBULATORY_CARE_PROVIDER_SITE_OTHER): Payer: Medicare Other | Admitting: Podiatry

## 2022-01-03 ENCOUNTER — Ambulatory Visit (INDEPENDENT_AMBULATORY_CARE_PROVIDER_SITE_OTHER): Payer: Medicare Other

## 2022-01-03 DIAGNOSIS — S92535A Nondisplaced fracture of distal phalanx of left lesser toe(s), initial encounter for closed fracture: Secondary | ICD-10-CM | POA: Diagnosis not present

## 2022-01-03 DIAGNOSIS — L03116 Cellulitis of left lower limb: Secondary | ICD-10-CM | POA: Diagnosis not present

## 2022-01-03 DIAGNOSIS — M10072 Idiopathic gout, left ankle and foot: Secondary | ICD-10-CM

## 2022-01-03 DIAGNOSIS — L97512 Non-pressure chronic ulcer of other part of right foot with fat layer exposed: Secondary | ICD-10-CM

## 2022-01-03 DIAGNOSIS — M79672 Pain in left foot: Secondary | ICD-10-CM

## 2022-01-03 MED ORDER — DOXYCYCLINE HYCLATE 100 MG PO TABS: 100.0000 mg | ORAL_TABLET | Freq: Two times a day (BID) | ORAL | 0 refills | Status: DC

## 2022-01-03 NOTE — Progress Notes (Signed)
Subjective: Chief Complaint  Patient presents with   Foot Swelling    left foot swelling, started a month ago, patient denies any pain, patient does remember hitting 2nd toe.     56 year old female presents the office for above concerns.  She states that her left foot some swelling getting red.  She does not see any open lesions or any areas of drainage.  Treatment receiving her second toe but no other injuries.  Right foot is been doing well.  She states that she still is 1 small area that is opened back up.  Objective: AAO x3, NAD DP/PT pulses palpable bilaterally, CRT less than 3 seconds Superficial granular wound measuring 0.3 x 0.3 x 0.1 cm there is no probing to bone or joint.  No surrounding erythema, ascending cellulitis.  No fluctuation or crepitation is noted. There is edema and erythema present to the left foot.  There is no areas of fluctuance or crepitation.  There is no open lesions that I can appreciate today as well.  Flexor, extensor tendons appear to be intact.  MMT 5/5 bilaterally. No pain with calf compression, swelling, warmth, erythema  Assessment: Left foot swelling, erythema; right foot ulceration  Plan: -All treatment options discussed with the patient including all alternatives, risks, complications.  -X-rays were obtained and reviewed of the left foot.  Fracture of the second toe.  No significant fracture otherwise. -Although she had her second toe doing this account for the edema and erythema to the entire foot.  Discussed possibly gout versus infection versus Charcot.  Prescribed doxycycline.  I have ordered blood work wound sed rate, CRP, CBC, uric acid.  Cam boot for immobilization. -Right foot wound appears to be noninfected and doing well.  Continue daily dressing changes and offloading. -Monitor for any clinical signs or symptoms of infection and directed to call the office immediately should any occur or go to the ER. -Patient encouraged to call the office  with any questions, concerns, change in symptoms.   Trula Slade DPM

## 2022-01-05 ENCOUNTER — Telehealth: Payer: Self-pay | Admitting: Student

## 2022-01-05 ENCOUNTER — Other Ambulatory Visit: Payer: Self-pay | Admitting: Podiatry

## 2022-01-05 LAB — CBC WITH DIFFERENTIAL/PLATELET
Basophils Absolute: 0.1 10*3/uL (ref 0.0–0.2)
Basos: 1 %
EOS (ABSOLUTE): 0.2 10*3/uL (ref 0.0–0.4)
Eos: 3 %
Hematocrit: 35.7 % (ref 34.0–46.6)
Hemoglobin: 11.8 g/dL (ref 11.1–15.9)
Immature Grans (Abs): 0 10*3/uL (ref 0.0–0.1)
Immature Granulocytes: 0 %
Lymphocytes Absolute: 1.3 10*3/uL (ref 0.7–3.1)
Lymphs: 13 %
MCH: 30.6 pg (ref 26.6–33.0)
MCHC: 33.1 g/dL (ref 31.5–35.7)
MCV: 93 fL (ref 79–97)
Monocytes Absolute: 1 10*3/uL — ABNORMAL HIGH (ref 0.1–0.9)
Monocytes: 11 %
Neutrophils Absolute: 7 10*3/uL (ref 1.4–7.0)
Neutrophils: 72 %
Platelets: 347 10*3/uL (ref 150–450)
RBC: 3.85 x10E6/uL (ref 3.77–5.28)
RDW: 13.7 % (ref 11.7–15.4)
WBC: 9.6 10*3/uL (ref 3.4–10.8)

## 2022-01-05 LAB — SEDIMENTATION RATE: Sed Rate: 57 mm/hr — ABNORMAL HIGH (ref 0–40)

## 2022-01-05 LAB — URIC ACID: Uric Acid: 9.6 mg/dL — ABNORMAL HIGH (ref 3.0–7.2)

## 2022-01-05 LAB — C-REACTIVE PROTEIN: CRP: 16 mg/L — ABNORMAL HIGH (ref 0–10)

## 2022-01-05 MED ORDER — COLCHICINE 0.6 MG PO TABS: 0.6000 mg | ORAL_TABLET | Freq: Every day | ORAL | 0 refills | Status: DC

## 2022-01-05 NOTE — Telephone Encounter (Signed)
Pt calling to get her Lab results please call.

## 2022-01-05 NOTE — Telephone Encounter (Signed)
Patient is not active on mychart

## 2022-01-06 NOTE — Telephone Encounter (Signed)
Patient called and was given results /instructions per physician, to call if symptoms worsen or fail to improve, verbalized understanding. Should she remain in boot until f/u appointment?

## 2022-01-06 NOTE — Telephone Encounter (Signed)
Left voicemail for the patient to call the office back for lab results.

## 2022-01-06 NOTE — Telephone Encounter (Signed)
Patient has been given recommendations for wearing her boot, verbalized understanding.

## 2022-01-17 ENCOUNTER — Ambulatory Visit: Payer: Medicare Other | Admitting: Podiatry

## 2022-01-18 ENCOUNTER — Other Ambulatory Visit: Payer: Self-pay | Admitting: Podiatry

## 2022-01-18 ENCOUNTER — Telehealth: Payer: Self-pay | Admitting: Podiatry

## 2022-01-18 MED ORDER — COLCHICINE 0.6 MG PO TABS: 0.6000 mg | ORAL_TABLET | Freq: Every day | ORAL | 0 refills | Status: AC

## 2022-01-18 NOTE — Telephone Encounter (Signed)
Patient called and wanted to get a refill on gout medication. She had an appointment yesterday and she was in the hospital so she had to r/s.

## 2022-01-24 NOTE — Telephone Encounter (Signed)
I added patient to Dr. Ardelle Anton schedule tomorrow, there was a cancellation

## 2022-01-24 NOTE — Telephone Encounter (Signed)
Patient called and stated that her foot is very swollen and warm to touch. She stated that the medication prescribed is not working anymore. She was wondering if she can be worked in on Dr. Ardelle Anton scheduled today or can someone giver her a call.  Please advise

## 2022-01-25 ENCOUNTER — Ambulatory Visit: Payer: Medicare Other | Admitting: Podiatry

## 2022-01-29 ENCOUNTER — Ambulatory Visit (INDEPENDENT_AMBULATORY_CARE_PROVIDER_SITE_OTHER): Payer: Medicare Other

## 2022-01-29 ENCOUNTER — Ambulatory Visit (INDEPENDENT_AMBULATORY_CARE_PROVIDER_SITE_OTHER): Payer: BC Managed Care – PPO | Admitting: Podiatry

## 2022-01-29 DIAGNOSIS — M14672 Charcot's joint, left ankle and foot: Secondary | ICD-10-CM | POA: Diagnosis not present

## 2022-01-29 DIAGNOSIS — M10072 Idiopathic gout, left ankle and foot: Secondary | ICD-10-CM

## 2022-01-29 DIAGNOSIS — R2242 Localized swelling, mass and lump, left lower limb: Secondary | ICD-10-CM

## 2022-01-29 DIAGNOSIS — S92535A Nondisplaced fracture of distal phalanx of left lesser toe(s), initial encounter for closed fracture: Secondary | ICD-10-CM

## 2022-01-29 DIAGNOSIS — L97512 Non-pressure chronic ulcer of other part of right foot with fat layer exposed: Secondary | ICD-10-CM

## 2022-01-29 NOTE — Patient Instructions (Signed)
[employer] Care- REMOVE IN 3-5 DAYS OR SOONER IF THERE IS INCREASED SWELLING/PAIN/TOE DISCOLORATION An Unna boot is a type of bandage (dressing) for the foot and leg. The dressing is a wrap made of gauze that is soaked with a medicine called zinc oxide. The gauze may also include other lotions and medicines that help in wound healing, such as calamine. An Unna boot may be used to: Treat open sores (venous ulcers) or graft sites on the foot, heel, or leg. Help with swelling from conditions that affect the veins or lymphatic system (lymphedema). Treat skin conditions such as inflammation caused by poor blood flow (stasis dermatitis). Heal wounds on parts of the body below the hips (lower extremities). The dressing is applied by a health care provider. The gauze is wrapped around your lower extremity in several layers that overlap. These layers usually start at the toes and go up to the knee. A dry outer wrap goes over the medicated wrap for support and pressure (compression). Before applying the [employer], your health care provider will clean your leg and foot and may apply an antibiotic. You may be asked to raise (elevate) your leg for a while to reduce swelling before the boot is put on. The boot will dry and harden after it is applied. It may need to be changed or replaced once or twice a week. Follow these instructions at home: Boot care Wear the [employer] as told by your health care provider. You may need to wear a slipper or shoe over the boot that is one or two sizes larger than normal. Do not stick anything inside the boot to scratch your skin. Doing that increases your risk of infection. Check the skin around the boot every day. Tell your health care provider about any concerns. Keep your [employer]ocker clean and dry. Check the area around the boot every day for signs of infection. Check for: Redness, swelling, or pain in your foot or toes. Fluid or blood coming from the boot. Warmth. Pus or a  bad smell. A rash, itching, or red, swollen areas of skin (hives). Remove the boot and call your health care provider if you have signs of poor blood flow, such as: Your toes tingle or become numb. Your toes turn cold or turn blue or pale. Your toes are more swollen or painful. You cannot move your toes. Bathing Do not take baths, swim, or use a hot tub until your health care provider approves. Ask your health care provider if you may take showers. You may only be allowed to take sponge baths. If your health care provider says that you can take a bath or shower: Do not let the Unna boot get wet. Cover the boot with a watertight covering when you shower. Keep your leg with the boot out of the bathtub when you take a bath. Activity Rest as told by your health care provider. Do not sit for a long time without moving. Get up to take short walks every 1-2 hours. This will improve blood flow and breathing. Ask for help if you feel weak or unsteady. You may walk with the boot once it has dried. Ask your health care provider how much walking is safe for you. General instructions Take over-the-counter and prescription medicines only as told by your health care provider. Keep your leg elevated above the level of your heart while you are sitting or lying down. This will decrease swelling. Do not sit with your knee bent for  long periods of time. Do not use any products that contain nicotine or tobacco. These products include cigarettes, chewing tobacco, and vaping devices, such as e-cigarettes. If you need help quitting, ask your health care provider. Keep all follow-up visits. Your health care provider will change your boot once or twice a week until it is no longer needed. Contact a health care provider if: Your skin feels itchy inside the boot. You feel burning or have a rash or hives in the boot area. You have a fever or chills. You have any signs of infection. You have more numbness or pain in  your foot or toes. The skin on your foot or toes changes colors. This may include the skin turning blue or pale or having patchy areas with spots. Your boot has been damaged or feels like it no longer fits like it should. This information is not intended to replace advice given to you by your health care provider. Make sure you discuss any questions you have with your health care provider. Document Revised: 07/12/2021 Document Reviewed: 07/12/2021 Elsevier Patient Education  2023 [employer].

## 2022-01-29 NOTE — Progress Notes (Signed)
Subjective: Chief Complaint  Patient presents with   Foot Problem    Patient came in today for left foot swelling and redness with some pain, patient states that the swelling started 10 days ago, rate of pain 5 out of 10, X-rays were taken today   56 year old female presents for above concerns.  She said that she is still getting swelling or redness.  She states the pain Is resolved but she still been swelling and redness and she is on antibiotics.  No recent injuries.  She is still in the cam boot.  No fever or chills.   Objective: AAO x3, NAD DP/PT pulses palpable bilaterally, CRT less than 3 seconds Left foot: There is still edema and mild erythema present to the left foot mostly on the midfoot area.  There is no open lesions and there is no drainage or pus or any fluctuation or crepitation.  No other signs of infection noted. Right foot: Right foot submetatarsal areas annular ulceration measuring 0.3 x 0.2 x 0.2 cm without any probing to bone or joint.  There is no surrounding erythema, ascending cellulitis.  There is no fluctuance or crepitation.  No malodor. No pain with calf compression, swelling, warmth, erythema  Assessment: Left foot Charcot, right foot ulceration  Plan: -All treatment options discussed with the patient including all alternatives, risks, complications.  -X-rays obtained reviewed of the left foot.  3 views were obtained.  There is changes present along the Lisfranc joint consistent with Charcot changes. -For the left foot recommended nonweightbearing.  Given the swelling I placed her into a new boot today and continue the cam boot.  She has crutches at home for nonweightbearing she is getting get a knee scooter.  She is not able to get 1 over the weekend as today is Saturday she is in contact the office on Monday and we will order this for her.  Need to stay nonweightbearing.  Discussed total contact casting but I held off on this today because of swelling and I was  worried about the neuropathy and the swelling and rubbing with the cast. -Continue daily dressing changes in the right foot. -Patient encouraged to call the office with any questions, concerns, change in symptoms.     [name] DPM

## 2022-01-31 ENCOUNTER — Ambulatory Visit: Payer: Medicare Other | Admitting: Podiatry

## 2022-02-07 ENCOUNTER — Ambulatory Visit (INDEPENDENT_AMBULATORY_CARE_PROVIDER_SITE_OTHER): Payer: BC Managed Care – PPO | Admitting: Podiatry

## 2022-02-07 DIAGNOSIS — L97512 Non-pressure chronic ulcer of other part of right foot with fat layer exposed: Secondary | ICD-10-CM

## 2022-02-07 DIAGNOSIS — M14672 Charcot's joint, left ankle and foot: Secondary | ICD-10-CM

## 2022-02-12 ENCOUNTER — Other Ambulatory Visit: Payer: Self-pay | Admitting: Podiatry

## 2022-02-12 DIAGNOSIS — R2242 Localized swelling, mass and lump, left lower limb: Secondary | ICD-10-CM

## 2022-02-12 DIAGNOSIS — M14672 Charcot's joint, left ankle and foot: Secondary | ICD-10-CM

## 2022-02-12 DIAGNOSIS — L97512 Non-pressure chronic ulcer of other part of right foot with fat layer exposed: Secondary | ICD-10-CM

## 2022-02-12 DIAGNOSIS — S92535A Nondisplaced fracture of distal phalanx of left lesser toe(s), initial encounter for closed fracture: Secondary | ICD-10-CM

## 2022-02-12 DIAGNOSIS — M10072 Idiopathic gout, left ankle and foot: Secondary | ICD-10-CM

## 2022-02-13 NOTE — Progress Notes (Signed)
Subjective: Chief Complaint  Patient presents with   charcot's     Left foot, patient states she is having less swelling,      56 year old female presents for above concerns.  She states the swelling has improved compared to last appointment.  She been nonweightbearing in the cam boot utilizing a knee scooter.  No open lesions that she reports.    Objective: AAO x3, NAD DP/PT pulses palpable bilaterally, CRT less than 3 seconds Left foot: There is still edema present to the foot but this is much improved compared to last appointment.  There is some mild increased temperature compared to the contralateral extremity.  There is no significant erythema.  There is no open lesions. Right foot: Right foot submetatarsal areas annular ulceration measuring 0.2 x 0.2 x 0.2 cm without any probing to bone or joint.  There is no surrounding erythema, ascending cellulitis.  There is no fluctuance or crepitation.  No malodor. No pain with calf compression, swelling, warmth, erythema  Assessment: Left foot Charcot, right foot ulceration  Plan: -All treatment options discussed with the patient including all alternatives, risks, complications.  -In regards to the left foot we discussed continuing nonweightbearing, immobilization.  We discussed putting a cast on that the swelling in her neuropathy is hesitant to do this due to causing ulcerations.  She has been very compliant with nonweightbearing.  Continue with immobilization in cam boot, nonweightbearing. -Continue daily dressing changes in the right foot. -Patient encouraged to call the office with any questions, concerns, change in symptoms.   [name] DPM

## 2022-02-15 ENCOUNTER — Ambulatory Visit (INDEPENDENT_AMBULATORY_CARE_PROVIDER_SITE_OTHER): Payer: BC Managed Care – PPO | Admitting: Podiatry

## 2022-02-15 DIAGNOSIS — M14672 Charcot's joint, left ankle and foot: Secondary | ICD-10-CM

## 2022-02-18 NOTE — Progress Notes (Signed)
Subjective: Chief Complaint  Patient presents with   charcot     Left foot charcot's foot, X-rays done and cast setup     56 year old female presents for above concerns.  Presents today for follow-up evaluation of Charcot on the left foot.  She states that 7 some swelling but is not worsening.  No open lesions that she reports.  No significant pain at this time.    Objective: AAO x3, NAD DP/PT pulses palpable bilaterally, CRT less than 3 seconds Left foot: There is still edema present to the foot but this is much improved compared to last appointment.  There is some mild increased temperature compared to the contralateral extremity.  There is no significant erythema.  There is no open lesions. Right foot: Right foot submetatarsal areas annular ulceration measuring 0.2 x 0.2 x 0.2 cm without any probing to bone or joint.  There is no surrounding erythema, ascending cellulitis.  There is no fluctuance or crepitation.  No malodor. No pain with calf compression, swelling, warmth, erythema  Assessment: Left foot Charcot, right foot ulceration  Plan: -All treatment options discussed with the patient including all alternatives, risks, complications.  -X-rays were obtained reviewed.  3 views of the foot were obtained.  Lucency the Lisfranc joint consistent with Charcot but appears to be stable compared to last x-rays. -As the swelling has improved we will place into a below-knee fiberglass cast.  A well-padded below-knee cast was applied making sure to pad all bony prominences well.  She is to continue nonweightbearing.  Elevation.  Cast change next appointment, make sure to pad all prominences well.  [name] DPM

## 2022-02-22 ENCOUNTER — Ambulatory Visit (INDEPENDENT_AMBULATORY_CARE_PROVIDER_SITE_OTHER): Payer: Medicare Other | Admitting: *Deleted

## 2022-02-22 DIAGNOSIS — L97521 Non-pressure chronic ulcer of other part of left foot limited to breakdown of skin: Secondary | ICD-10-CM

## 2022-02-22 DIAGNOSIS — M14672 Charcot's joint, left ankle and foot: Secondary | ICD-10-CM

## 2022-02-22 MED ORDER — DOXYCYCLINE HYCLATE 100 MG PO TABS: 100.0000 mg | ORAL_TABLET | Freq: Two times a day (BID) | ORAL | 0 refills | Status: DC

## 2022-02-22 NOTE — Progress Notes (Signed)
Patient presents today for cast change of the left foot, patient of [facility]. She is being treated for charcot left.   She presents using her knee scooter with her below the knee cast. Denies any falls or injury to the foot. No calf pain or shortness of breath. Bandages/cast dry and intact.  Upon evaluation, prior to cast removal, there is an area that appears to have been rubbed by the cast. The area has been bleeding, but not currently active.  Cast removed and reveals a wound dorsal forefoot below the 4th and 5th toes left. There is redness and swelling. Picture of the wound was taken today.    Dr. Allena Katz evaluated, recommended no cast application today, betadine wet to dry dressing changes daily and also sent in Doxycycline.    Foot redressed today with betadine and DSD. Patient has a boot at home and she will put that on as soon as she gets there. Her husband is here to help with mobility needs. Reviewed instructions for dressing changes and continued non-weight bearing. She will follow up with Dr. Ardelle Anton in 1 week.

## 2022-02-24 ENCOUNTER — Telehealth: Payer: Self-pay | Admitting: Podiatry

## 2022-02-24 NOTE — Telephone Encounter (Signed)
Spoke the patient and she states that she is having some yellow drainage, redness, and swelling. Patient is already on the antibiotic but she is not having any pain, no foul smell, and no fever. Do you want the patient to be seen sooner than 03/03/22 or can another antibiotic be called in.

## 2022-02-24 NOTE — Telephone Encounter (Signed)
Patient called and lvm on the general mailbox and stated that she was seen on 12/26. She changed the dressing and her foot is red around the incision, it has moved up to the 4th and 5th toe.  Patient is requesting someone to call her back

## 2022-02-25 ENCOUNTER — Other Ambulatory Visit: Payer: Self-pay | Admitting: Podiatry

## 2022-02-25 MED ORDER — AMOXICILLIN-POT CLAVULANATE 875-125 MG PO TABS: 1.0000 | ORAL_TABLET | Freq: Two times a day (BID) | ORAL | 0 refills | Status: DC

## 2022-02-25 NOTE — Telephone Encounter (Signed)
Patient is aware and verbalized understanding of information given. 

## 2022-03-03 ENCOUNTER — Ambulatory Visit (INDEPENDENT_AMBULATORY_CARE_PROVIDER_SITE_OTHER): Payer: BC Managed Care – PPO

## 2022-03-03 ENCOUNTER — Ambulatory Visit (INDEPENDENT_AMBULATORY_CARE_PROVIDER_SITE_OTHER): Payer: BC Managed Care – PPO | Admitting: Podiatry

## 2022-03-03 DIAGNOSIS — L97512 Non-pressure chronic ulcer of other part of right foot with fat layer exposed: Secondary | ICD-10-CM

## 2022-03-03 DIAGNOSIS — I739 Peripheral vascular disease, unspecified: Secondary | ICD-10-CM | POA: Diagnosis not present

## 2022-03-03 DIAGNOSIS — M14672 Charcot's joint, left ankle and foot: Secondary | ICD-10-CM

## 2022-03-03 DIAGNOSIS — E109 Type 1 diabetes mellitus without complications: Secondary | ICD-10-CM

## 2022-03-03 MED ORDER — DOXYCYCLINE HYCLATE 100 MG PO TABS: 100.0000 mg | ORAL_TABLET | Freq: Two times a day (BID) | ORAL | 0 refills | Status: DC

## 2022-03-03 NOTE — Progress Notes (Signed)
Subjective: Chief Complaint  Patient presents with   Foot Ulcer    Patient came in today for a ulcer top of the left foot, patient denies any pain, there is some drainage, TX: Cam Boot and knee scooter, X-rays done     57 year old female presents for above concerns.  Since I saw her last she developed a wound on the left foot.  She been keeping Betadine on this.  She states that looking better and the scabs come off.  She had some bloody clear drainage but no frank purulence.  Swelling to the foot proved compared to what it was.  No fevers or chills.   Objective: AAO x3, NAD DP/PT pulses palpable bilaterally, CRT less than 3 seconds Left foot: Full-thickness ulceration noted on the dorsal aspect of the left forefoot.  Greenish tissue on the periphery and fibrotic wound centrally.  This remains cellulitis.  There is no fluctuance or crepitation and there is no malodor.  No exposed bone or tendon today. Right foot: Right foot submetatarsal area has become larger.  Today measures 0.5 x 0.5 x 0.4 cm and is probing close to bone but not to the bone today.  Prior debridement it was smaller measuring 0.3 x 0.3 x 0.2 centimeters. No exposed bone or tendon.  There is no surrounding erythema, ascending cellulitis.  No fluctuance or crepitation.   No pain with calf compression, swelling, warmth, erythema    Assessment: Left foot Charcot, right foot ulceration  Plan: -All treatment options discussed with the patient including all alternatives, risks, complications.  -X-rays were obtained reviewed of the left foot.  3 views of the foot were obtained.  Lucency the Lisfranc joint consistent with Charcot but appears to be stable compared to last x-rays.  No evidence of osteomyelitis. -I did the wound today.  We discussed using Medihoney which I applied again for today.  Continue daily dressing changes. -Medically necessary wound debridement is performed on the right foot.  I utilized a #[id number] with scalpel to  sharply debride the wound down to healthy, granular tissue debrided of nonviable evaluated tissue in order promote wound healing.  There is no significant blood loss.  I irrigated with saline.  Betadine was applied to the wound.  Recommended dressing changes and she can use Medihoney for this as well.  Offloading pads were dispensed.  Unfortunately now that she is nonweightbearing left foot I think this is putting more pressure on the right. -Doxycycline -Updated ABI ordered.  Trula Slade DPM

## 2022-03-10 ENCOUNTER — Ambulatory Visit (HOSPITAL_COMMUNITY): Payer: BC Managed Care – PPO | Attending: Podiatry

## 2022-03-16 ENCOUNTER — Other Ambulatory Visit: Payer: Self-pay | Admitting: Podiatry

## 2022-03-16 DIAGNOSIS — M14672 Charcot's joint, left ankle and foot: Secondary | ICD-10-CM

## 2022-03-16 DIAGNOSIS — I739 Peripheral vascular disease, unspecified: Secondary | ICD-10-CM

## 2022-03-16 DIAGNOSIS — E109 Type 1 diabetes mellitus without complications: Secondary | ICD-10-CM

## 2022-03-16 DIAGNOSIS — L97512 Non-pressure chronic ulcer of other part of right foot with fat layer exposed: Secondary | ICD-10-CM

## 2022-03-21 ENCOUNTER — Ambulatory Visit: Payer: Medicare Other | Admitting: Podiatry

## 2022-03-22 ENCOUNTER — Ambulatory Visit: Payer: Medicare Other | Admitting: Podiatry

## 2022-03-24 ENCOUNTER — Ambulatory Visit (HOSPITAL_COMMUNITY)
Admission: RE | Admit: 2022-03-24 | Payer: BC Managed Care – PPO | Source: Ambulatory Visit | Attending: Podiatry | Admitting: Podiatry

## 2022-03-28 ENCOUNTER — Ambulatory Visit: Payer: Medicare Other | Admitting: Podiatry

## 2022-06-09 ENCOUNTER — Ambulatory Visit: Payer: Medicare Other | Admitting: Podiatry

## 2022-06-23 ENCOUNTER — Ambulatory Visit (INDEPENDENT_AMBULATORY_CARE_PROVIDER_SITE_OTHER): Payer: BC Managed Care – PPO | Admitting: Podiatry

## 2022-06-23 ENCOUNTER — Ambulatory Visit (INDEPENDENT_AMBULATORY_CARE_PROVIDER_SITE_OTHER): Payer: BC Managed Care – PPO

## 2022-06-23 DIAGNOSIS — M14672 Charcot's joint, left ankle and foot: Secondary | ICD-10-CM

## 2022-06-23 DIAGNOSIS — M79671 Pain in right foot: Secondary | ICD-10-CM

## 2022-06-23 DIAGNOSIS — M86171 Other acute osteomyelitis, right ankle and foot: Secondary | ICD-10-CM

## 2022-06-23 DIAGNOSIS — L97414 Non-pressure chronic ulcer of right heel and midfoot with necrosis of bone: Secondary | ICD-10-CM | POA: Diagnosis not present

## 2022-06-26 NOTE — Progress Notes (Signed)
Subjective: Chief Complaint  Patient presents with   Wound Check    57 year old female presents for above concerns.  Since I saw her last she has been in the hospital for different back issues that she developed a purple area to the right heel which then developed into a large full-thickness ulceration.  She has been going to the wound care center and the wound is now to the bone and she is been started on clindamycin, ciprofloxacin.  She is also referred to vascular evaluation.  She currently denies any fevers or chills.  She does have an offloading boot.  Objective: AAO x3, NAD DP/PT pulses palpable bilaterally, CRT less than 3 seconds Left foot: Ulceration is healed. Right foot: Large full-thickness ulceration which probes to bone at the posterior aspect of the right heel.  Mild surrounding erythema without any ascending cellulitis.  There is no fluctuation or crepitation there is no malodor.  Wound base is fibrogranular with mostly granulation tissue present. No pain with calf compression, swelling, warmth, erythema   Assessment: Right heel full-thickness ulceration  Plan: -All treatment options discussed with the patient including all alternatives, risks, complications.  -X-rays were obtained reviewed of the right foot.  3 views of the foot were obtained.  Soft tissue defect noted along the posterior heel and there is concern for cortical changes to the posterior calcaneus consistent with osteomyelitis. -I had a long discussion with the patient regards to this ulcer.  She was recently started on clindamycin, ciprofloxacin.  She is scheduled to see infectious disease on Tuesday.  We discussed possibly doing a bone biopsy.  I like for her to be off of antibiotics prior to the bone biopsy to get a more accurate result. -She has been referred for vascular testing.  She does have palpable pulses but given the large ulceration on the right foot I agree with this. -Offloading at all  times. -Continue daily dressing changes with Dakin's. -Unfortunately the patient is at high risk of limb loss which we discussed.  I discussed with her in order to attempt limb salvage I would recommend long-term antibiotics, aggressive wound care, offloading.  We discussed surgical intervention.  We discussed biopsy and/or debridement, graft, wound VAC application.   RTC 1 week, or sooner if needed  35 minutes were spent  [name] DPM

## 2022-07-01 ENCOUNTER — Ambulatory Visit (INDEPENDENT_AMBULATORY_CARE_PROVIDER_SITE_OTHER): Payer: BC Managed Care – PPO | Admitting: Podiatry

## 2022-07-01 ENCOUNTER — Ambulatory Visit (INDEPENDENT_AMBULATORY_CARE_PROVIDER_SITE_OTHER): Payer: BC Managed Care – PPO

## 2022-07-01 ENCOUNTER — Encounter: Payer: Self-pay | Admitting: Podiatry

## 2022-07-01 DIAGNOSIS — L97414 Non-pressure chronic ulcer of right heel and midfoot with necrosis of bone: Secondary | ICD-10-CM

## 2022-07-01 DIAGNOSIS — S9031XA Contusion of right foot, initial encounter: Secondary | ICD-10-CM | POA: Diagnosis not present

## 2022-07-01 DIAGNOSIS — S92041A Displaced other fracture of tuberosity of right calcaneus, initial encounter for closed fracture: Secondary | ICD-10-CM

## 2022-07-01 DIAGNOSIS — M86071 Acute hematogenous osteomyelitis, right ankle and foot: Secondary | ICD-10-CM | POA: Diagnosis not present

## 2022-07-01 NOTE — Progress Notes (Signed)
Subjective: Chief Complaint  Patient presents with   Foot Ulcer    Follow up ulcer heel right   "I started infusions yesterday"   57 year old female presents the office for above concerns.  She did follow with infectious disease and she started her IV antibiotics this week.  She did have a bone biopsy performed previously that they were able to find.  Due to this she was started on IV antibiotics.  She is continuing Dakin's solution, iodoform packing to the wound daily.  She does not report any fevers or chills.  She tested that she was going up a step left foot slide infection she has had swelling and bruising and pain to be healed.  Objective: AAO x3, NAD DP/PT pulses palpable bilaterally, CRT less than 3 seconds Large full-thickness ulceration noted to right heel measuring about 4.3 cm with a depth of 1 cm with fibrogranular wound base.  There is 1 central area probing but it does not probe to bone although it is close today.  There is surrounding edema and ecchymosis to the posterior heel.  Achilles tendon clinically appears to be intact. No pain with calf compression, swelling, warmth, erythema  Assessment: 57 year old female with right heel ulceration, osteomyelitis, calcaneal fracture  Plan: -All treatment options discussed with the patient including all alternatives, risks, complications.  -X-rays were obtained reviewed.  There does appear to be displaced posterior tubercle fracture, likely from avulsion of the Achilles versus osteomyelitis. -We discussed unfortunately she is at risk of amputation but we will continue with warm salvage, wound care.  Would recommend to continue with IV antibiotics, local wound care.  Will hold off on surgical intervention for now and will reassess next week.  I did sharply debride some of the hyperkeratotic tissue was present on the wound as well as the fibrotic tissue.  Iodoform was applied followed by dressing.  Continue with her dressing changes at home  with Dakin's solution.  Possible plan for surgery if the wound does not heal in the next week or 2 for debridement, possible wound VAC and graft. -CAM boot dispensed for immobilization -NWB -Wound care center referral.  -Monitor for any clinical signs or symptoms of infection and directed to call the office immediately should any occur or go to the ER. -Patient encouraged to call the office with any questions, concerns, change in symptoms.   [name] [name] DPM

## 2022-07-08 ENCOUNTER — Ambulatory Visit (INDEPENDENT_AMBULATORY_CARE_PROVIDER_SITE_OTHER): Payer: BC Managed Care – PPO | Admitting: Podiatry

## 2022-07-08 DIAGNOSIS — L97414 Non-pressure chronic ulcer of right heel and midfoot with necrosis of bone: Secondary | ICD-10-CM | POA: Diagnosis not present

## 2022-07-08 NOTE — Progress Notes (Unsigned)
Subjective: Chief Complaint  Patient presents with   Foot Ulcer    1 week follow up right heel ulcer    57 year old female presents the office for above concerns.  She is still on IV antibiotics and doing daily dressing changes with Dakin solution, packing.  Swelling is improved.  Denies any fevers or chills.  She has no new concerns today.   Objective: AAO x3, NAD DP/PT pulses palpable bilaterally, CRT less than 3 seconds Large full-thickness ulceration noted to right heel measuring about 4 x 3 cm with a depth of 0.6 cm with fibrogranular wound base.  There is increased granulation tissue present in the central area that was probing is filling in.  Overall the wound appears to be not as deep.  The edema that was present is improved.  There is no significant cellulitis.  There is no fluctuation or crepitation.  There is no malodor.   No pain with calf compression, swelling, warmth, erythema  Assessment: 57 year old female with right heel ulceration, osteomyelitis, calcaneal fracture  Plan: -All treatment options discussed with the patient including all alternatives, risks, complications.  -Medically necessary wound debridement was performed today.  I sharply debrided the wound to remove fibrotic tissue that was present on the central aspect of the tissue nipper as well as a #312 with scalpel.  I debrided the wound down to healthy, bleeding, granulation tissue.  There is minimal blood loss and hemostasis achieved with manual compression.  Pre and post wound measurements are the same.  Iodoform packing was applied followed by dressing.  Continue offloading at all times. -Continue antibiotics per infectious disease -Offloading at all times -Monitor for any clinical signs or symptoms of infection and directed to call the office immediately should any occur or go to the ER.  X-ray next appointment   [name] DPM

## 2022-07-12 ENCOUNTER — Encounter (HOSPITAL_BASED_OUTPATIENT_CLINIC_OR_DEPARTMENT_OTHER): Payer: BC Managed Care – PPO | Attending: Internal Medicine | Admitting: Internal Medicine

## 2022-07-12 DIAGNOSIS — Z794 Long term (current) use of insulin: Secondary | ICD-10-CM | POA: Insufficient documentation

## 2022-07-12 DIAGNOSIS — M86671 Other chronic osteomyelitis, right ankle and foot: Secondary | ICD-10-CM | POA: Diagnosis not present

## 2022-07-12 DIAGNOSIS — E10621 Type 1 diabetes mellitus with foot ulcer: Secondary | ICD-10-CM

## 2022-07-12 DIAGNOSIS — L97514 Non-pressure chronic ulcer of other part of right foot with necrosis of bone: Secondary | ICD-10-CM | POA: Insufficient documentation

## 2022-07-12 NOTE — Progress Notes (Signed)
NABA, BUONOCORE ([id number]) [id number] Page 1 of 4 Visit Report for 07/12/2022 Abuse Risk Screen Details Patient Name: Date of Service: [name] [name], [name] [name] 07/12/2022 12:30 PM Medical Record Number: [id number] Patient Account Number: [id number] Date of Birth/Sex: Treating RN: [birth year] (57 y.o. [name] [name] Primary Care Elke Holtry: [name] Other Clinician: Referring Maxcine Strong: Treating Noboru Bidinger/Extender: Jenene Slicker in Treatment: 0 Abuse Risk Screen Items Answer ABUSE RISK SCREEN: Has anyone close to you tried to hurt or harm you recentlyo No Do you feel uncomfortable with anyone in your familyo No Has anyone forced you do things that you didnt want to doo No Electronic Signature(s) Signed: 07/12/2022 4:28:59 PM By: Redmond Pulling RN, BSN Entered By: Redmond Pulling on 07/12/2022 12:46:10 -------------------------------------------------------------------------------- Activities of Daily Living Details Patient Name: Date of Service: [name], [name] 07/12/2022 12:30 PM Medical Record Number: [id number] Patient Account Number: [id number] Date of Birth/Sex: Treating RN: Aug 13, 1965 (57 y.o. [name] [name] Primary Care Gulianna Hornsby: [name] Other Clinician: Referring Allani Reber: Treating Fallyn Munnerlyn/Extender: Jenene Slicker in Treatment: 0 Activities of Daily Living Items Answer Activities of Daily Living (Please select one for each item) Drive Automobile Need Assistance T Medications ake Completely Able Use T elephone Completely Able Care for Appearance Completely Able Use T oilet Completely Able Bath / Shower Completely Able Dress Self Completely Able Feed Self Completely Able Walk Completely Able Get In / Out Bed Completely Able Housework Need Assistance Prepare Meals Need Assistance Handle Money Completely Able Shop for Self Need Assistance Electronic Signature(s) Signed:  07/12/2022 4:28:59 PM By: Redmond Pulling RN, BSN Entered By: Redmond Pulling on 07/12/2022 12:47:01 -------------------------------------------------------------------------------- Education Screening Details Patient Name: Date of Service: [name] Milo D. 07/12/2022 12:30 PM Medical Record Number: [id number] Patient Account Number: [id number] Date of Birth/Sex: Treating RN: [birth year] (57 y.o. [name] [name] Primary Care Radley Barto: [name] Other Clinician: Referring Melah Ebling: Treating Krystianna Soth/Extender: Jenene Slicker in Treatment: [address] [name], [name] ([id number]) [id number] Page 2 of 4 Learning Preferences/Education Level/Primary Language Learning Preference: Explanation, Demonstration, Printed Material Highest Education Level: College or Above Preferred Language: Economist Language Barrier: No Translator Needed: No Memory Deficit: No Emotional Barrier: No Cultural/Religious Beliefs Affecting Medical Care: No Physical Barrier Impaired Vision: No Impaired Hearing: No Decreased Hand dexterity: No Knowledge/Comprehension Knowledge Level: High Comprehension Level: High Ability to understand written instructions: High Ability to understand verbal instructions: High Motivation Anxiety Level: Calm Cooperation: Cooperative Education Importance: Acknowledges Need Interest in Health Problems: Asks Questions Perception: Coherent Willingness to Engage in Self-Management High Activities: Readiness to Engage in Self-Management High Activities: Electronic Signature(s) Signed: 07/12/2022 4:28:59 PM By: Redmond Pulling RN, BSN Entered By: Redmond Pulling on 07/12/2022 12:47:34 -------------------------------------------------------------------------------- Fall Risk Assessment Details Patient Name: Date of Service: [name] Milo D. 07/12/2022 12:30 PM Medical Record Number: [id number] Patient Account  Number: [id number] Date of Birth/Sex: Treating RN: December 29, 1965 (57 y.o. [name] [name] Primary Care Shun Pletz: [name] Other Clinician: Referring Zymier Rodgers: Treating Prajwal Fellner/Extender: Jenene Slicker in Treatment: 0 Fall Risk Assessment Items Have you had 2 or more falls in the last 12 monthso 0 No Have you had any fall that resulted in injury in the last 12 monthso 0 No FALLS RISK SCREEN History of falling - immediate or within 3 months 0 No Secondary diagnosis (Do you have 2 or more medical diagnoseso) 15 Yes Ambulatory aid None/bed rest/wheelchair/nurse 0 Yes Crutches/cane/walker 0 No Furniture 0 No  Intravenous therapy Access/Saline/Heparin Lock 0 No Gait/Transferring Normal/ bed rest/ wheelchair 0 Yes Weak (short steps with or without shuffle, stooped but able to lift head while walking, may seek 0 No support from furniture) Impaired (short steps with shuffle, may have difficulty arising from chair, head down, impaired 0 No balance) Mental Status Oriented to own ability 0 Yes Overestimates or forgets limitations 0 No Risk Level: Low Risk Score: [address] D ([id number]) [phone] [id number] Page 3 of 4 Electronic Signature(s) -------------------------------------------------------------------------------- Foot Assessment Details Patient Name: Date of Service: [name], [name] 07/12/2022 12:30 PM Medical Record Number: [id number] Patient Account Number: [id number] Date of Birth/Sex: Treating RN: 1965/08/21 (57 y.o. [name] [name] Primary Care Mosella Kasa: [name] Other Clinician: Referring Ketty Bitton: Treating Hajer Dwyer/Extender: Jenene Slicker in Treatment: 0 Foot Assessment Items Site Locations + = Sensation present, - = Sensation absent, C = Callus, U = Ulcer R = Redness, W = Warmth, M = Maceration, PU = Pre-ulcerative lesion F = Fissure, S = Swelling, D =  Dryness Assessment Right: Left: Other Deformity: No No Prior Foot Ulcer: No No Prior Amputation: No No Charcot Joint: No No Ambulatory Status: Gait: Electronic Signature(s) Signed: 07/12/2022 4:28:59 PM By: Redmond Pulling RN, BSN Entered By: Redmond Pulling on 07/12/2022 12:52:25 -------------------------------------------------------------------------------- Nutrition Risk Screening Details Patient Name: Date of Service: JAMARIYAH, HADDIX 07/12/2022 12:30 PM Medical Record Number: [id number] Patient Account Number: [id number] Date of Birth/Sex: Treating RN: 01/26/1966 (57 y.o. [name] [name] Primary Care Raymie Trani: [name] Other Clinician: Referring Lylla Eifler: Treating Auden Tatar/Extender: Jenene Slicker in Treatment: 0 Height (in): 65 Weight (lbs): 105 Body Mass Index (BMI): 17.5 ADHVIKA, HERSEY ([id number]) [phone] [id number] Page 4 of 4 Nutrition Risk Screening Items Score Screening NUTRITION RISK SCREEN: I have an illness or condition that made me change the kind and/or amount of food I eat 0 No I eat fewer than two meals per day 0 No I eat few fruits and vegetables, or milk products 0 No I have three or more drinks of beer, liquor or wine almost every day 0 No I have tooth or mouth problems that make it hard for me to eat 0 No I don't always have enough money to buy the food I need 0 No I eat alone most of the time 0 No I take three or more different prescribed or over-the-counter drugs a day 1 Yes Without wanting to, I have lost or gained 10 pounds in the last six months 0 No I am not always physically able to shop, cook and/or feed myself 0 No Nutrition Protocols Good Risk Protocol Moderate Risk Protocol High Risk Proctocol Risk Level: Good Risk Score: 1 Electronic Signature(s) Signed: 07/12/2022 4:28:59 PM By: Redmond Pulling RN, BSN Entered By: Redmond Pulling on 07/12/2022 12:48:09

## 2022-07-14 ENCOUNTER — Ambulatory Visit: Payer: BC Managed Care – PPO | Admitting: Podiatry

## 2022-07-15 ENCOUNTER — Ambulatory Visit (INDEPENDENT_AMBULATORY_CARE_PROVIDER_SITE_OTHER): Payer: BC Managed Care – PPO | Admitting: Podiatry

## 2022-07-15 ENCOUNTER — Ambulatory Visit (INDEPENDENT_AMBULATORY_CARE_PROVIDER_SITE_OTHER): Payer: BC Managed Care – PPO

## 2022-07-15 ENCOUNTER — Encounter: Payer: Self-pay | Admitting: Podiatry

## 2022-07-15 DIAGNOSIS — M86071 Acute hematogenous osteomyelitis, right ankle and foot: Secondary | ICD-10-CM

## 2022-07-15 DIAGNOSIS — L97414 Non-pressure chronic ulcer of right heel and midfoot with necrosis of bone: Secondary | ICD-10-CM | POA: Diagnosis not present

## 2022-07-18 ENCOUNTER — Encounter (HOSPITAL_BASED_OUTPATIENT_CLINIC_OR_DEPARTMENT_OTHER): Payer: BC Managed Care – PPO | Admitting: Internal Medicine

## 2022-07-18 DIAGNOSIS — L97514 Non-pressure chronic ulcer of other part of right foot with necrosis of bone: Secondary | ICD-10-CM | POA: Diagnosis not present

## 2022-07-18 DIAGNOSIS — E10621 Type 1 diabetes mellitus with foot ulcer: Secondary | ICD-10-CM

## 2022-07-18 DIAGNOSIS — M86671 Other chronic osteomyelitis, right ankle and foot: Secondary | ICD-10-CM

## 2022-07-18 LAB — GLUCOSE, CAPILLARY
Glucose-Capillary: 179 mg/dL — ABNORMAL HIGH (ref 70–99)
Glucose-Capillary: 299 mg/dL — ABNORMAL HIGH (ref 70–99)

## 2022-07-18 NOTE — Progress Notes (Signed)
[name], [name] ([id number]) [id number] Page 1 of 2 Visit Report for 07/18/2022 HBO Details Patient Name: Date of Service: [name] [name], [name] [name] 07/18/2022 12:00 PM Medical Record Number: [id number] Patient Account Number: [id number] Date of Birth/Sex: Treating RN: 06-May-1965 (57 y.o. [name] [name] Primary Care Marielouise Amey: [name] Other Clinician: Haywood Pao Referring Pier Laux: Treating Sharnese Heath/Extender: [name], [name]: 0 HBO Treatment Course Details Treatment Course Number: 2 Ordering Crystle Carelli: [name] T Treatments Ordered: otal 40 HBO Treatment Start Date: 07/18/2022 HBO Indication: Diabetic Ulcer(s) of the Lower Extremity Standard/Conservative Wound Care tried and failed greater than or equal to 30 days Wound #4 Right Calcaneus HBO Treatment Details Treatment Number: 1 Patient Type: Outpatient Chamber Type: Monoplace Chamber Serial #: [id number] Treatment Protocol: 2.0 ATA with 90 minutes oxygen, with two 5 minute air breaks Treatment Details Compression Rate Down: 1.0 psi / minute De-Compression Rate Up: 1.0 psi / minute A breaks and breathing ir Compress Tx Pressure periods Decompress Decompress Begins Reached (leave unused spaces Begins Ends blank) Chamber Pressure (ATA 1 2 2 2 2 2  --2 1 ) Clock Time (24 hr) 12:41 12:57 13:27 13:32 14:02 14:07 - - 14:37 14:53 Treatment Length: 132 (minutes) Treatment Segments: 4 Vital Signs Capillary Blood Glucose Reference Range: 80 - 120 mg / dl HBO Diabetic Blood Glucose Intervention Range: <131 mg/dl or >782 mg/dl Type: Time Vitals Blood Respiratory Capillary Blood Glucose Pulse Action Pulse: Temperature: Taken: Pressure: Rate: Glucose (mg/dl): Meter #: Oximetry (%) Taken: Pre 12:09 124/69 72 18 97.9 179 2 none per protocol Post 14:56 161/83 70 18 97.2 299 2 informed physician Treatment Response Treatment Toleration: Well Treatment  Completion Status: Treatment Completed without Adverse Event Treatment Notes [name] arrived with vital signs within normal limits. She prepared for treatment. Dr. Mikey Bussing examined her as part of her first HBO treatment today. After performing a safety check and reviewing safety information, patient was placed in the chamber which was compressed at a rate of 1 psi/min continually checking for normal ear equalization. She denied any issues with ear equalization and/or pain during compression. [name] tolerated the treatment and the subsequent decompression of the chamber at the rate of 1 psi/min. She stated that she was experiencing some equalization issues immediately after exiting the chamber but stated that the pressure in her middle ear equalized after a few moments. She was exhibiting some thirst with her measured blood glucose of 299 mg/dL. Dr. Mikey Bussing was informed of the blood glucose level. Her post-treatment vital signs were otherwise normal. She was stable upon discharge. Additional Procedure Documentation Tissue Sevierity: Necrosis of bone Malayjah Otoole Notes Evaluated patient prior to her first dive. Lungs clear to auscultation to the anterior and posterior aspect. Cardiac regular rate and rhythm. Tympanic membranes visualized to both ears. Overall patient states she feels well. Physician HBO Attestation: I certify that I supervised this HBO treatment in accordance with Medicare guidelines. A trained emergency response team is readily available per Yes hospital policies and procedures. Continue HBOT as ordered. Yes Electronic Signature(s) Signed: 07/19/2022 4:32:32 PM By: Audley Hose, Signed: 07/19/2022 4:32:32 PM By: Sinda Du D ([id number]) [id number] Page 2 of 2 Previous Signature: 07/19/2022 12:02:08 PM Version By: [name] DO Previous Signature: 07/18/2022 4:04:35 PM Version By: Haywood Pao CHT EMT BS ,  , Previous Signature: 07/18/2022 1:19:53 PM Version By: Haywood Pao CHT EMT BS , , Entered By: [name] on 07/19/2022 16:07:15 -------------------------------------------------------------------------------- HBO Safety Checklist  Details Patient Name: Date of Service: [name], [name] 07/18/2022 12:00 PM Medical Record Number: [id number] Patient Account Number: [id number] Date of Birth/Sex: Treating RN: 1965/10/09 (57 y.o. [name] [name] Primary Care Britini Garcilazo: [name] Other Clinician: Haywood Pao Referring Jearl Soto: Treating Bowen Goyal/Extender: [name], [name]: 0 HBO Safety Checklist Items Safety Checklist Consent Form Signed Patient voided / foley secured and emptied When did you last eato 1100 Last dose of injectable or oral agent 0800 Humalog Ostomy pouch emptied and vented if applicable NA All implantable devices assessed, documented and approved Libre 3 on Approved Items List Intravenous access site secured and place Right Arm PICC Valuables secured Linens and cotton and cotton/polyester blend (less than 51% polyester) Personal oil-based products / skin lotions / body lotions removed Wigs or hairpieces removed NA Smoking or tobacco materials removed NA Books / newspapers / magazines / loose paper removed Cologne, aftershave, perfume and deodorant removed Jewelry removed (may wrap wedding band) Make-up removed Hair care products removed Battery operated devices (external) removed Heating patches and chemical warmers removed Titanium eyewear removed Nail polish cured greater than 10 hours greater than 10 hours Casting material cured greater than 10 hours NA Hearing aids removed NA Loose dentures or partials removed NA Prosthetics have been removed NA Patient demonstrates correct use of air break device (if applicable) Patient concerns have been addressed Patient grounding bracelet on and cord  attached to chamber Specifics for Inpatients (complete in addition to above) Medication sheet sent with patient NA Intravenous medications needed or due during therapy sent with patient NA Drainage tubes (e.g. nasogastric tube or chest tube secured and vented) NA Endotracheal or Tracheotomy tube secured NA Cuff deflated of air and inflated with saline NA Airway suctioned NA Notes Paper version used prior to treatment start. Electronic Signature(s) Signed: 07/18/2022 1:10:11 PM By: Haywood Pao CHT EMT BS , , Entered By: Haywood Pao on 07/18/2022 13:10:10

## 2022-07-19 ENCOUNTER — Encounter (HOSPITAL_BASED_OUTPATIENT_CLINIC_OR_DEPARTMENT_OTHER): Payer: BC Managed Care – PPO | Admitting: Internal Medicine

## 2022-07-19 DIAGNOSIS — L97514 Non-pressure chronic ulcer of other part of right foot with necrosis of bone: Secondary | ICD-10-CM | POA: Diagnosis not present

## 2022-07-19 DIAGNOSIS — M86671 Other chronic osteomyelitis, right ankle and foot: Secondary | ICD-10-CM

## 2022-07-19 DIAGNOSIS — E10621 Type 1 diabetes mellitus with foot ulcer: Secondary | ICD-10-CM | POA: Diagnosis not present

## 2022-07-19 LAB — GLUCOSE, CAPILLARY
Glucose-Capillary: 190 mg/dL — ABNORMAL HIGH (ref 70–99)
Glucose-Capillary: 112 mg/dL — ABNORMAL HIGH (ref 70–99)

## 2022-07-19 NOTE — Progress Notes (Signed)
[name] [name], [name] [name] ([id number]) [id number] Page 1 of 2 Visit Report for 07/19/2022 HBO Details Patient Name: Date of Service: [name] [name], [name] [name] 07/19/2022 8:00 A M Medical Record Number: [id number] Patient Account Number: [id number] Date of Birth/Sex: Treating RN: 05-Aug-1965 (57 y.o. [name] [name], [name] Primary Care Hameed Kolar: [name] Other Clinician: Haywood Pao Referring [name] [name]: Treating [name] [name]/Extender: [name], [name]: 1 HBO Treatment Course Details Treatment Course Number: 2 Ordering Chenoah Mcnally: [name] T Treatments Ordered: otal 40 HBO Treatment Start Date: 07/18/2022 HBO Indication: Diabetic Ulcer(s) of the Lower Extremity Standard/Conservative Wound Care tried and failed greater than or equal to 30 days Wound #4 Right Calcaneus HBO Treatment Details Treatment Number: 2 Patient Type: Outpatient Chamber Type: Monoplace Chamber Serial #: [id number] Treatment Protocol: 2.0 ATA with 90 minutes oxygen, with two 5 minute air breaks Treatment Details Compression Rate Down: 1.0 psi / minute De-Compression Rate Up: 1.0 psi / minute A breaks and breathing ir Compress Tx Pressure periods Decompress Decompress Begins Reached (leave unused spaces Begins Ends blank) Chamber Pressure (ATA 1 2 2 2 2 2  --2 1 ) Clock Time (24 hr) 08:01 08:16 08:46 08:51 09:21 09:26 - - 09:56 10:10 Treatment Length: 129 (minutes) Treatment Segments: 4 Vital Signs Capillary Blood Glucose Reference Range: 80 - 120 mg / dl HBO Diabetic Blood Glucose Intervention Range: <131 mg/dl or >782 mg/dl Type: Time Vitals Blood Respiratory Capillary Blood Glucose Pulse Action Pulse: Temperature: Taken: Pressure: Rate: Glucose (mg/dl): Meter #: Oximetry (%) Taken: Pre 07:38 148/81 69 18 97.1 190 none per protocol Post 10:16 178/101 69 18 97.2 112 repeat BP Post 10:34 145/84 71 none per protocol. Treatment Response Treatment  Toleration: Well Treatment Completion Status: Treatment Completed without Adverse Event Treatment Notes Mrs. [name] [name] arrived with vital signs within normal limits. She prepared for treatment. After performing a safety check and reviewing safety information, patient was placed in the chamber which was compressed at a rate of 1 psi/min continually checking for normal ear equalization. She denied any issues with ear equalization and/or pain during compression. Mrs. [name] [name] tolerated the treatment and the subsequent decompression of the chamber at the rate of 1 psi/min. She denied any issues with ear equalization and/or pain. Her post-treatment blood pressure was 178/101 mmHg (forearm). After approximately 18 minutes her blood pressure was taken again with a result of 145/84. She was stable upon discharge. Additional Procedure Documentation Tissue Sevierity: Necrosis of bone Physician HBO Attestation: I certify that I supervised this HBO treatment in accordance with Medicare guidelines. A trained emergency response team is readily available per Yes hospital policies and procedures. Continue HBOT as ordered. Yes Electronic Signature(s) Signed: 07/19/2022 4:32:32 PM By: [name] DO Previous Signature: 07/19/2022 10:59:10 AM Version By: Haywood Pao CHT EMT BS , , Entered By: [name] on 07/19/2022 16:05:12 [name] [name] ([id number]) [id number] Page 2 of 2 -------------------------------------------------------------------------------- HBO Safety Checklist Details Patient Name: Date of Service: [name] [name] 07/19/2022 8:00 A M Medical Record Number: [id number] Patient Account Number: [id number] Date of Birth/Sex: Treating RN: Feb 12, 1966 (57 y.o. [name] [name] Primary Care Pernell Lenoir: [name] Other Clinician: Haywood Pao Referring Dorse Locy: Treating Careen Mauch/Extender: [name], [name]: 1 HBO  Safety Checklist Items Safety Checklist Consent Form Signed Patient voided / foley secured and emptied When did you last eato 0700 Last dose of injectable or oral agent 0700 Ostomy pouch emptied and vented if applicable NA All implantable devices assessed,  documented and approved Libre 3 on Approved Items List Intravenous access site secured and place Right Arm PICC Valuables secured Linens and cotton and cotton/polyester blend (less than 51% polyester) Personal oil-based products / skin lotions / body lotions removed Wigs or hairpieces removed NA Smoking or tobacco materials removed NA Books / newspapers / magazines / loose paper removed Cologne, aftershave, perfume and deodorant removed Jewelry removed (may wrap wedding band) Make-up removed Hair care products removed Battery operated devices (external) removed Heating patches and chemical warmers removed Titanium eyewear removed Nail polish cured greater than 10 hours greater than 10 hours Casting material cured greater than 10 hours NA Hearing aids removed NA Loose dentures or partials removed NA Prosthetics have been removed NA Patient demonstrates correct use of air break device (if applicable) Patient concerns have been addressed Patient grounding bracelet on and cord attached to chamber Specifics for Inpatients (complete in addition to above) Medication sheet sent with patient NA Intravenous medications needed or due during therapy sent with patient NA Drainage tubes (e.g. nasogastric tube or chest tube secured and vented) NA Endotracheal or Tracheotomy tube secured NA Cuff deflated of air and inflated with saline NA Airway suctioned NA Notes Paper version used prior to treatment start. Electronic Signature(s) Signed: 07/20/2022 1:28:43 PM By: Haywood Pao CHT EMT BS , , Previous Signature: 07/19/2022 10:52:08 AM Version By: Haywood Pao CHT EMT BS , , Entered By: Haywood Pao on  07/20/2022 16:10:96

## 2022-07-19 NOTE — Progress Notes (Signed)
[name] [name] ([id number]) [id number] Page 1 of 1 Visit Report for 07/19/2022 Arrival Information Details Patient Name: Date of Service: [name] [name] 07/19/2022 8:00 A M Medical Record Number: [id number] Patient Account Number: [id number] Date of Birth/Sex: Treating RN: 01-31-1966 (57 y.o. [name] [name], [name] Primary Care Veronia Laprise: [name] Other Clinician: Haywood Pao Referring Eliyanah Elgersma: Treating Ajahnae Rathgeber/Extender: [name], [name]: 1 Visit Information History Since Last Visit All ordered tests and consults were completed: Yes Patient Arrived: Wheel Chair Added or deleted any medications: No Arrival Time: 07:30 Any new allergies or adverse reactions: No Accompanied By: spouse Had a fall or experienced change in No Transfer Assistance: None activities of daily living that may affect Patient Identification Verified: Yes risk of falls: Secondary Verification Process Completed: Yes Signs or symptoms of abuse/neglect since last visito No Hospitalized since last visit: No Implantable device outside of the clinic excluding No cellular tissue based products placed in the center since last visit: Pain Present Now: No Electronic Signature(s) Signed: 07/19/2022 9:18:41 AM By: Haywood Pao CHT EMT BS , , Entered By: Haywood Pao on 07/19/2022 09:18:41

## 2022-07-19 NOTE — Progress Notes (Signed)
DUSTIE, TACKE ([id number]) [id number] Page 1 of 1 Visit Report for 07/18/2022 SuperBill Details Patient Name: Date of Service: [name] [name], [name] [name] 07/18/2022 Medical Record Number: [id number] Patient Account Number: [id number] Date of Birth/Sex: Treating RN: [birth year] (57 y.o. 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 Other chronic osteomyelitis, right ankle and foot E10.621 Type 1 diabetes mellitus with foot ulcer Facility Procedures CPT4 Code Description Modifier Quantity [id number] G0277-(Facility Use Only) HBOT full body chamber, , 4 ICD-10 Diagnosis Description E10.621 Type 1 diabetes mellitus with foot ulcer L97.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

## 2022-07-20 ENCOUNTER — Encounter (HOSPITAL_BASED_OUTPATIENT_CLINIC_OR_DEPARTMENT_OTHER): Payer: BC Managed Care – PPO | Admitting: Physician Assistant

## 2022-07-20 DIAGNOSIS — E10621 Type 1 diabetes mellitus with foot ulcer: Secondary | ICD-10-CM | POA: Diagnosis not present

## 2022-07-20 LAB — GLUCOSE, CAPILLARY
Glucose-Capillary: 209 mg/dL — ABNORMAL HIGH (ref 70–99)
Glucose-Capillary: 117 mg/dL — ABNORMAL HIGH (ref 70–99)

## 2022-07-20 NOTE — Progress Notes (Signed)
[name] [name], [name] [name] ([id number]) [id number] Page 1 of 2 Visit Report for 07/20/2022 Problem List Details Patient Name: Date of Service: [name], [name] 07/20/2022 8:00 A M Medical Record Number: [id number] Patient Account Number: [id number] Date of Birth/Sex: Treating RN: Jan 20, 1966 (57 y.o. [name], [name] Primary Care Provider: [name] Other Clinician: Karl Bales Referring Provider: Treating Provider/Extender: Alford Highland, [name]: 1 Active Problems ICD-10 Encounter Code Description Active Date MDM Diagnosis L97.514 Non-pressure chronic ulcer of other part of right foot with 07/12/2022 No Yes necrosis of bone M86.671 Other chronic osteomyelitis, right ankle and foot 07/12/2022 No Yes E10.621 Type 1 diabetes mellitus with foot ulcer 07/12/2022 No Yes Inactive Problems Resolved Problems Electronic Signature(s) Signed: 07/20/2022 10:55:54 AM By: Karl Bales EMT Signed: 07/20/2022 3:53:56 PM By: Allen Derry PA-C Entered By: Karl Bales on 07/20/2022 10:55:53 -------------------------------------------------------------------------------- SuperBill Details Patient Name: Date of Service: [name] [name] 07/20/2022 Medical Record Number: [id number] Patient Account Number: [id number] Date of Birth/Sex: Treating RN: 08-08-1965 (57 y.o. 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 Other chronic osteomyelitis, right ankle and foot E10.621 Type 1 diabetes mellitus with foot ulcer Facility Procedures Physician Procedures : CPT4 Code Description Modifier [id number] [phone] - WC PHYS HYPERBARIC OXYGEN THERAPY ICD-10 Diagnosis  Description E10.621 Type 1 diabetes mellitus with foot ulcer L97.514 Non-pressure chronic ulcer of other part of right foot with necrosis o M86.671 Other  chronic osteomyelitis, right ankle and foot Quantity: 1 f bone Electronic Signature(s) Signed: 07/20/2022 10:55:48 AM By: Karl Bales EMT Signed: 07/20/2022 3:53:56 PM By: Allen Derry PA-C Entered By: Karl Bales on 07/20/2022 10:55:47

## 2022-07-20 NOTE — Progress Notes (Signed)
[name] [name], [name] [name] ([id number]) [id number] Page 1 of 1 Visit Report for 07/19/2022 SuperBill Details Patient Name: Date of Service: [name] [name], [name] [name] 07/19/2022 Medical Record Number: [id number] Patient Account Number: [id number] Date of Birth/Sex: Treating RN: [birth year] (57 y.o. 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 Other chronic osteomyelitis, right ankle and foot E10.621 Type 1 diabetes mellitus with foot ulcer Facility Procedures CPT4 Code Description Modifier Quantity [id number] G0277-(Facility Use Only) HBOT full body chamber, , 4 ICD-10 Diagnosis Description E10.621 Type 1 diabetes mellitus with foot ulcer L97.514 Non-pressure chronic ulcer of other part of right foot with necrosis of bone M86.671 Other chronic osteomyelitis, right ankle and foot Physician Procedures Quantity CPT4 Code Description Modifier [id number] 99183 - WC PHYS HYPERBARIC OXYGEN THERAPY 1 ICD-10 Diagnosis Description E10.621 Type 1 diabetes mellitus with foot ulcer L97.514 Non-pressure chronic ulcer of other part of right foot with necrosis of bone M86.671 Other chronic osteomyelitis, right ankle and foot Electronic Signature(s) Signed: 07/19/2022 10:59:35 AM By: Haywood Pao CHT EMT BS , , Signed: 07/19/2022 4:32:32 PM By: [name] DO Entered By: Haywood Pao on 07/19/2022 10:59:35

## 2022-07-20 NOTE — Progress Notes (Addendum)
[name], [name] ([id number]) [id number] Page 1 of 2 Visit Report for 07/20/2022 HBO Details Patient Name: Date of Service: [name] [name] 07/20/2022 8:00 A M Medical Record Number: [id number] Patient Account Number: [id number] Date of Birth/Sex: Treating RN: 03-16-1965 (57 y.o. [name] [name], [name] Primary Care [name] [name]: [name] Other Clinician: Karl Bales Referring [name] [name]: Treating Boubacar Lerette/Extender: Alford Highland, [name]: 1 HBO Treatment Course Details Treatment Course Number: 2 Ordering Mattea Seger: [name] T Treatments Ordered: otal 40 HBO Treatment Start Date: 07/18/2022 HBO Indication: Diabetic Ulcer(s) of the Lower Extremity Standard/Conservative Wound Care tried and failed greater than or equal to 30 days Wound #4 Right Calcaneus HBO Treatment Details Treatment Number: 3 Patient Type: Outpatient Chamber Type: Monoplace Chamber Serial #: [id number] Treatment Protocol: 2.0 ATA with 90 minutes oxygen, with two 5 minute air breaks Treatment Details Compression Rate Down: 1.0 psi / minute De-Compression Rate Up: 1.5 psi / minute A breaks and breathing ir Compress Tx Pressure periods Decompress Decompress Begins Reached (leave unused spaces Begins Ends blank) Chamber Pressure (ATA 1 2 2 2 2 2  --2 1 ) Clock Time (24 hr) 07:59 08:14 08:44 08:49 09:19 09:24 - - 09:54 10:05 Treatment Length: 126 (minutes) Treatment Segments: 4 Vital Signs Capillary Blood Glucose Reference Range: 80 - 120 mg / dl HBO Diabetic Blood Glucose Intervention Range: <131 mg/dl or >782 mg/dl Time Vitals Blood Respiratory Capillary Blood Glucose Pulse Action Type: Pulse: Temperature: Taken: Pressure: Rate: Glucose (mg/dl): Meter #: Oximetry (%) Taken: Pre 07:34 156/89 75 18 97.2 209 Post 10:08 161/89 71 18 97.2 117 Treatment Response Treatment Toleration: Well Treatment Completion Status: Treatment Completed without Adverse  Event Additional Procedure Documentation Tissue Sevierity: Necrosis of bone Electronic Signature(s) Signed: 07/20/2022 10:54:43 AM By: Karl Bales EMT Signed: 07/20/2022 3:53:56 PM By: Allen Derry PA-C Entered By: Karl Bales on 07/20/2022 10:54:43 -------------------------------------------------------------------------------- HBO Safety Checklist Details Patient Name: Date of Service: [name] Milo D. 07/20/2022 8:00 A M Medical Record Number: [id number] Patient Account Number: [id number] Date of Birth/Sex: Treating RN: 1966-02-15 (57 y.o. [name] [name], [name] Primary Care [name] [name]: [name] Other Clinician: Karl Bales Referring [name] [name]: Treating Lorice Lafave/Extender: Alford Highland, [name]: 1 HBO Safety Checklist Items Safety Checklist [name] [name] ([id number]) [id number] Page 2 of 2 Consent Form Signed Patient voided / foley secured and emptied When did you last eato 0700 Last dose of injectable or oral agent 0700 Ostomy pouch emptied and vented if applicable NA All implantable devices assessed, documented and approved NA Intravenous access site secured and place PICC Line Valuables secured Linens and cotton and cotton/polyester blend (less than 51% polyester) Personal oil-based products / skin lotions / body lotions removed Wigs or hairpieces removed Smoking or tobacco materials removed Books / newspapers / magazines / loose paper removed Cologne, aftershave, perfume and deodorant removed Jewelry removed (may wrap wedding band) Make-up removed Hair care products removed Battery operated devices (external) removed Libre3 Heating patches and chemical warmers removed Titanium eyewear removed NA Nail polish cured greater than 38 hours week old Haematologist cured greater than 10 hours NA Hearing aids removed NA Loose dentures or partials removed NA Prosthetics have been removed NA Patient  demonstrates correct use of air break device (if applicable) Patient concerns have been addressed Patient grounding bracelet on and cord attached to chamber Specifics for Inpatients (complete in addition to above) Medication sheet sent with patient NA Intravenous medications needed or due during  therapy sent with patient NA Drainage tubes (e.g. nasogastric tube or chest tube secured and vented) NA Endotracheal or Tracheotomy tube secured NA Cuff deflated of air and inflated with saline NA Airway suctioned NA Notes The safety checklist was done before the treatment was started. Electronic Signature(s) Signed: 07/20/2022 10:53:15 AM By: Karl Bales EMT Entered By: Karl Bales on 07/20/2022 10:53:14

## 2022-07-21 ENCOUNTER — Encounter (HOSPITAL_BASED_OUTPATIENT_CLINIC_OR_DEPARTMENT_OTHER): Payer: BC Managed Care – PPO | Admitting: Internal Medicine

## 2022-07-21 DIAGNOSIS — L97514 Non-pressure chronic ulcer of other part of right foot with necrosis of bone: Secondary | ICD-10-CM | POA: Diagnosis not present

## 2022-07-21 DIAGNOSIS — M86671 Other chronic osteomyelitis, right ankle and foot: Secondary | ICD-10-CM | POA: Diagnosis not present

## 2022-07-21 DIAGNOSIS — E10621 Type 1 diabetes mellitus with foot ulcer: Secondary | ICD-10-CM

## 2022-07-21 LAB — GLUCOSE, CAPILLARY
Glucose-Capillary: 201 mg/dL — ABNORMAL HIGH (ref 70–99)
Glucose-Capillary: 113 mg/dL — ABNORMAL HIGH (ref 70–99)

## 2022-07-21 NOTE — Progress Notes (Signed)
Subjective: Chief Complaint  Patient presents with   Foot Ulcer    Follow up heel ulcer right   "I think its looking better"    57 year old female presents the office for above concerns.  She is still on IV antibiotics and doing daily dressing changes with Dakin solution, packing.  Overall she states is doing a little better.  Still with some bloody drainage.  No purulence.  No fevers or chills.  Objective: AAO x3, NAD DP/PT pulses palpable bilaterally, CRT less than 3 seconds Large full-thickness ulceration noted to right heel measuring about 4 x 3 cm however it is much more superficial and there is no probing to bone today.  Wound is 90% granular with 10% fibrotic tissue.  There is minimal surrounding rim of erythema without any ascending cellulitis there is no drainage or pus.  No fluctuation or crepitation was noted.  No pain with calf compression, swelling, warmth, erythema  Assessment: 57 year old female with right heel ulceration, osteomyelitis, calcaneal fracture  Plan: -All treatment options discussed with the patient including all alternatives, risks, complications.  -X-rays obtained and reviewed.  3 views of the foot were obtained.  Cortical changes of the posterior calcaneus consistent with osteomyelitis with fracture noted of the posterior calcaneus. -We discussed treatment options including limb salvage versus amputation.  She is in continue with limb salvage but we discussed risk of amputation -Debrided the fibrotic tissue with a 15 blade scalpel as well as a tissue nipper.  Debrided to healthy, bleeding tissue.  Minimal bleeding occurred.  Pre and post measurements are noted above.  Hemostasis due to manual compression.  She is to continue with Dakin's solution daily and packing. -She is followed with wound care center and likely can start hyperbaric oxygen therapy. -Continue antibiotics per infectious disease -Offloading at all times -Monitor for any clinical signs or  symptoms of infection and directed to call the office immediately should any occur or go to the ER.  [name] DPM

## 2022-07-21 NOTE — Progress Notes (Addendum)
FABLE, MURE ([id number]) [id number] Page 1 of 2 Visit Report for 07/21/2022 Arrival Information Details Patient Name: Date of Service: [name] [name], [name] [name] 07/21/2022 8:00 A M Medical Record Number: [id number] Patient Account Number: [id number] Date of Birth/Sex: Treating RN: [birth year] (57 y.o. [name] Primary Care Janathan Bribiesca: [name] Other Clinician: Karl Bales Referring Baily Serpe: Treating Keidy Thurgood/Extender: [name], [name]: 1 Visit Information History Since Last Visit All ordered tests and consults were completed: Yes Patient Arrived: Wheel Chair Added or deleted any medications: Yes Arrival Time: 07:28 Any new allergies or adverse reactions: No Accompanied By: Husband Had a fall or experienced change in No Transfer Assistance: EasyPivot Patient Lift activities of daily living that may affect Patient Identification Verified: Yes risk of falls: Secondary Verification Process Completed: Yes Signs or symptoms of abuse/neglect since last visito No Hospitalized since last visit: No Implantable device outside of the clinic excluding No cellular tissue based products placed in the center since last visit: Pain Present Now: No Electronic Signature(s) Signed: 07/21/2022 11:06:52 AM By: Karl Bales EMT Previous Signature: 07/21/2022 11:02:47 AM Version By: Karl Bales EMT Entered By: Karl Bales on 07/21/2022 11:06:51 -------------------------------------------------------------------------------- Encounter Discharge Information Details Patient Name: Date of Service: [name] Milo D. 07/21/2022 8:00 A M Medical Record Number: [id number] Patient Account Number: [id number] Date of Birth/Sex: Treating RN: May 28, 1965 (57 y.o. [name] Primary Care Zidane Renner: [name] Other Clinician: Karl Bales Referring Jawaun Celmer: Treating Sheridan Gettel/Extender: [name],  [name]: 1 Encounter Discharge Information Items Discharge Condition: Stable Ambulatory Status: Wheelchair Discharge Destination: Home Transportation: Private Auto Accompanied By: None Schedule Follow-up Appointment: No Clinical Summary of Care: Electronic Signature(s) Signed: 07/21/2022 11:06:35 AM By: Karl Bales EMT Entered By: Karl Bales on 07/21/2022 11:06:35 -------------------------------------------------------------------------------- Vitals Details Patient Name: Date of Service: [name] Milo D. 07/21/2022 8:00 A M Medical Record Number: [id number] Patient Account Number: [id number] Date of Birth/Sex: Treating RN: 1965/07/02 (57 y.o. [name] Primary Care Hargun Spurling: [name] Other Clinician: Karl Bales Referring Negar Sieler: Treating Aziyah Provencal/Extender: [name], [name]: 1 Vital Signs Time Taken: 07:36 Capillary Blood Glucose (mg/dl): 629 [name] [name], [name] [name] ([id number]) [id number].pdf Page 2 of 2 Height (in): 65 Reference Range: 80 - 120 mg / dl Weight (lbs): 295 Body Mass Index (BMI): 17.5 Electronic Signature(s) Signed: 07/21/2022 11:03:13 AM By: Karl Bales EMT Entered By: Karl Bales on 07/21/2022 11:03:13

## 2022-07-21 NOTE — Progress Notes (Signed)
[name], [name] ([id number]) [id number] Page 1 of 2 Visit Report for 07/21/2022 Problem List Details Patient Name: Date of Service: [name] [name], [name] [name] 07/21/2022 8:00 A M Medical Record Number: [id number] Patient Account Number: [id number] Date of Birth/Sex: Treating RN: [birth year] (57 y.o. 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 M86.671 Other chronic osteomyelitis, right ankle and foot 07/12/2022 No Yes E10.621 Type 1 diabetes mellitus with foot ulcer 07/12/2022 No Yes Inactive Problems Resolved Problems Electronic Signature(s) Signed: 07/21/2022 11:06:01 AM By: Karl Bales EMT Signed: 07/21/2022 12:56:08 PM By: [name] DO Entered By: Karl Bales on 07/21/2022 11:06:00 -------------------------------------------------------------------------------- SuperBill Details Patient Name: Date of Service: [name] 07/21/2022 Medical Record Number: [id number] Patient Account Number: [id number] Date of Birth/Sex: Treating RN: 05-May-1965 (57 y.o. 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, right ankle and foot E10.621 Type 1 diabetes mellitus with foot ulcer Facility Procedures Physician Procedures : CPT4 Code Description Modifier [id number] [phone] - WC PHYS HYPERBARIC OXYGEN THERAPY ICD-10  Diagnosis Description E10.621 Type 1 diabetes mellitus with foot ulcer L97.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

## 2022-07-21 NOTE — Progress Notes (Addendum)
[name], [name] ([id number]) [id number] Page 1 of 2 Visit Report for 07/21/2022 HBO Details Patient Name: Date of Service: [name] [name], [name] [name] 07/21/2022 8:00 A M Medical Record Number: [id number] Patient Account Number: [id number] Date of Birth/Sex: Treating RN: [birth year] (57 y.o. [name] [name] Primary Care Talib Headley: [name] Other Clinician: Karl Bales Referring Finleigh Cheong: Treating Tamrah Victorino/Extender: [name], [name]: 1 HBO Treatment Course Details Treatment Course Number: 2 Ordering Lunden Mcleish: [name] T Treatments Ordered: otal 40 HBO Treatment Start Date: 07/18/2022 HBO Indication: Diabetic Ulcer(s) of the Lower Extremity Standard/Conservative Wound Care tried and failed greater than or equal to 30 days Wound #4 Right Calcaneus HBO Treatment Details Treatment Number: 4 Patient Type: Outpatient Chamber Type: Monoplace Chamber Serial #: [id number] Treatment Protocol: 2.0 ATA with 90 minutes oxygen, with two 5 minute air breaks Treatment Details Compression Rate Down: 1.5 psi / minute De-Compression Rate Up: 1.5 psi / minute A breaks and breathing ir Compress Tx Pressure periods Decompress Decompress Begins Reached (leave unused spaces Begins Ends blank) Chamber Pressure (ATA 1 2 2 2 2 2  --2 1 ) Clock Time (24 hr) 08:09 08:17 08:47 08:52 09:22 09:27 - - 09:57 10:05 Treatment Length: 116 (minutes) Treatment Segments: 4 Vital Signs Capillary Blood Glucose Reference Range: 80 - 120 mg / dl HBO Diabetic Blood Glucose Intervention Range: <131 mg/dl or >782 mg/dl Time Vitals Blood Respiratory Capillary Blood Glucose Pulse Action Type: Pulse: Temperature: Taken: Pressure: Rate: Glucose (mg/dl): Meter #: Oximetry (%) Taken: Pre 07:36 201 Treatment Response Treatment Toleration: Well Treatment Completion Status: Treatment Completed without Adverse Event Additional Procedure  Documentation Tissue Sevierity: Necrosis of bone Physician HBO Attestation: I certify that I supervised this HBO treatment in accordance with Medicare guidelines. A trained emergency response team is readily available per Yes hospital policies and procedures. Continue HBOT as ordered. Yes Electronic Signature(s) Signed: 07/21/2022 4:23:19 PM By: [name] DO Previous Signature: 07/21/2022 11:05:31 AM Version By: Karl Bales EMT Previous Signature: 07/21/2022 12:56:08 PM Version By: [name] DO Entered By: [name] on 07/21/2022 16:16:12 -------------------------------------------------------------------------------- HBO Safety Checklist Details Patient Name: Date of Service: [name] 07/21/2022 8:00 A M Medical Record Number: [id number] Patient Account Number: [id number] Date of Birth/Sex: Treating RN: 1965/10/21 (57 y.o. [name] [name] [employer], [place]ley D ([id number]) [id number] Page 2 of 2 Primary Care Isamar Nazir: [name] Other Clinician: Karl Bales Referring Caitlen Worth: Treating Dawnya Grams/Extender: [name], [name]: 1 HBO Safety Checklist Items Safety Checklist Consent Form Signed Patient voided / foley secured and emptied When did you last eato 0700 Last dose of injectable or oral agent 0700 Ostomy pouch emptied and vented if applicable NA All implantable devices assessed, documented and approved NA Intravenous access site secured and place NA PICC Line removed 07/20/2022 Valuables secured Linens and cotton and cotton/polyester blend (less than 51% polyester) Personal oil-based products / skin lotions / body lotions removed Wigs or hairpieces removed Smoking or tobacco materials removed Books / newspapers / magazines / loose paper removed Cologne, aftershave, perfume and deodorant removed Jewelry removed (may wrap wedding band) Make-up removed Hair care products  removed Battery operated devices (external) removed Libre3 Heating patches and chemical warmers removed Titanium eyewear removed NA Nail polish cured greater than 10 hours NA Casting material cured greater than 10 hours NA Hearing aids removed NA Loose dentures or partials removed NA Prosthetics have been removed NA Patient demonstrates correct use of air break device (  if applicable) Patient concerns have been addressed Patient grounding bracelet on and cord attached to chamber Specifics for Inpatients (complete in addition to above) Medication sheet sent with patient NA Intravenous medications needed or due during therapy sent with patient NA Drainage tubes (e.g. nasogastric tube or chest tube secured and vented) NA Endotracheal or Tracheotomy tube secured NA Cuff deflated of air and inflated with saline NA Airway suctioned NA Notes The safety checklist was done before the treatment was started. Electronic Signature(s) Signed: 07/21/2022 11:04:35 AM By: Karl Bales EMT Entered By: Karl Bales on 07/21/2022 11:04:35

## 2022-07-22 ENCOUNTER — Encounter (HOSPITAL_BASED_OUTPATIENT_CLINIC_OR_DEPARTMENT_OTHER): Payer: BC Managed Care – PPO | Admitting: Internal Medicine

## 2022-07-22 DIAGNOSIS — L97514 Non-pressure chronic ulcer of other part of right foot with necrosis of bone: Secondary | ICD-10-CM | POA: Diagnosis not present

## 2022-07-22 DIAGNOSIS — M86671 Other chronic osteomyelitis, right ankle and foot: Secondary | ICD-10-CM

## 2022-07-22 DIAGNOSIS — E10621 Type 1 diabetes mellitus with foot ulcer: Secondary | ICD-10-CM | POA: Diagnosis not present

## 2022-07-22 LAB — GLUCOSE, CAPILLARY
Glucose-Capillary: 282 mg/dL — ABNORMAL HIGH (ref 70–99)
Glucose-Capillary: 295 mg/dL — ABNORMAL HIGH (ref 70–99)
Glucose-Capillary: 174 mg/dL — ABNORMAL HIGH (ref 70–99)

## 2022-07-22 NOTE — Progress Notes (Addendum)
[name] [name] ([id number]) [id number] Page 1 of 2 Visit Report for 07/22/2022 HBO Details Patient Name: Date of Service: [name] [name], [name] [name] 07/22/2022 8:00 A M Medical Record Number: [id number] Patient Account Number: [id number] Date of Birth/Sex: Treating RN: Jul 30, 1965 (57 y.o. [name] [name], [name] [name] Primary Care [name] [name]: [name] [name] Other Clinician: Haywood [name] Referring [name] [name]: Treating [name] [name]/Extender: [name] [name], [name]: 1 HBO Treatment Course Details Treatment Course Number: 2 Ordering [name] [name]: [name] [name] T Treatments Ordered: otal 40 HBO Treatment Start Date: 07/18/2022 HBO Indication: Diabetic Ulcer(s) of the Lower Extremity [name]/Conservative Wound Care tried and failed greater than or equal to 30 days Wound #4 Right Calcaneus HBO Treatment Details Treatment Number: 5 Patient Type: Outpatient Chamber Type: Monoplace Chamber Serial #: [id number] Treatment Protocol: 2.0 ATA with 90 minutes oxygen, with two 5 minute air breaks Treatment Details Compression Rate Down: 1.5 psi / minute De-Compression Rate Up: 1.5 psi / minute A breaks and breathing ir Compress Tx Pressure periods Decompress Decompress Begins Reached (leave unused spaces Begins Ends blank) Chamber Pressure (ATA 1 2 2 2 2 2  --2 1 ) Clock Time (24 hr) 08:05 08:16 08:46 08:51 09:21 09:26 - - 09:56 10:06 Treatment Length: 121 (minutes) Treatment Segments: 4 Vital Signs Capillary Blood Glucose Reference Range: 80 - 120 mg / dl HBO Diabetic Blood Glucose Intervention Range: <131 mg/dl or >782 mg/dl Time Blood Respiratory Capillary Blood Glucose Pulse Action Type: Vitals Pressure: Pulse: Temperature: Glucose (mg/dl): Meter #: Oximetry (%) Rate: Taken: Taken: Pre 07:39 146/83 78 18 97.3 282 1 informed physician/retest based on today's history Pre 08:04 295 1 Informed physician of CBG Post 10:07 151/86 76 18 97.1 174 1 none per  protocol. Treatment Response Treatment Toleration: Well Treatment Completion Status: Treatment Completed without Adverse Event Treatment Notes [name] [name] arrived with normal vital signs with the exception of blood glucose of 282 mg/dL. She stated that her [name] [name] 3 reader app was not working prior to arrival at the [name] [name]. The reader app started working and she self-administered 2 units insulin approximately 0730. She prepared for treatment. After performing a safety check, contacted physician about blood glucose level. Patient denied symptoms of hyperglycemia with the exception of fatigue stating that she did feel tired but unsure if just because it is morning. Dr. Mikey [name] ordered to re-check blood glucose level which resulted in 295 mg/dL. Patient was cleared for treatment. Patient was placed in the chamber which was compressed with 100% oxygen at rate set of 1.5 pis/min, denying any issues with ear equalization and/or pain. She tolerated treatment and subsequent decompression at the rate of 1.5 psi/min without any issues. Her post-treatment vital signs were within normal range and she was stable upon discharge. Additional Procedure Documentation Tissue Sevierity: Necrosis of bone Physician HBO Attestation: I certify that I supervised this HBO treatment in accordance with Medicare guidelines. A trained emergency response team is readily available per Yes hospital policies and procedures. Continue HBOT as ordered. Yes Electronic Signature(s) Signed: 07/22/2022 11:50:19 AM By: [name] Corwin DO Previous Signature: 07/22/2022 11:19:20 AM Version By: [name] [name] CHT EMT BS , , Previous Signature: 07/22/2022 9:07:00 AM Version By: [name] [name] CHT EMT BS , , [name] [name] ([id number]) [id number] Page 2 of 2 Previous Signature: 07/22/2022 9:07:00 AM Version By: [name] [name] CHT EMT BS , , Entered By: [name] [name] on 07/22/2022  11:50:06 -------------------------------------------------------------------------------- HBO Safety Checklist Details Patient Name: Date of Service: [name] [name], [name] [name]. 07/22/2022 8:00 A M Medical Record  Number: [id number] Patient Account Number: [id number] Date of Birth/Sex: Treating RN: 01-15-1966 (57 y.o. [name] [name] Primary Care Tashanda Fuhrer: [name] [name] Other Clinician: Haywood [name] Referring Kean Gautreau: Treating Amoree Newlon/Extender: [name] [name], [name]: 1 HBO Safety Checklist Items Safety Checklist Consent Form Signed Patient voided / foley secured and emptied When did you last eato 0700 Last dose of injectable or oral agent 0730 Ostomy pouch emptied and vented if applicable NA All implantable devices assessed, documented and approved NA Intravenous access site secured and place NA Valuables secured Linens and cotton and cotton/polyester blend (less than 51% polyester) Personal oil-based products / skin lotions / body lotions removed Wigs or hairpieces removed NA Smoking or tobacco materials removed NA Books / newspapers / magazines / loose paper removed Cologne, aftershave, perfume and deodorant removed Jewelry removed (may wrap wedding band) Make-up removed Hair care products removed Battery operated devices (external) removed Heating patches and chemical warmers removed Titanium eyewear removed Nail polish cured greater than 10 hours greater than 10 hours Casting material cured greater than 10 hours NA Hearing aids removed NA Loose dentures or partials removed NA Prosthetics have been removed NA Patient demonstrates correct use of air break device (if applicable) Patient concerns have been addressed Patient grounding bracelet on and cord attached to chamber Specifics for Inpatients (complete in addition to above) Medication sheet sent with patient NA Intravenous medications needed or due during therapy sent with  patient NA Drainage tubes (e.g. nasogastric tube or chest tube secured and vented) NA Endotracheal or Tracheotomy tube secured NA Cuff deflated of air and inflated with saline NA Airway suctioned NA Notes The safety checklist was done before the treatment was started. Electronic Signature(s) Signed: 07/22/2022 8:51:04 AM By: [name] [name] CHT EMT BS , , Entered By: [name] [name] on 07/22/2022 08:51:04

## 2022-07-22 NOTE — Progress Notes (Signed)
ARITZEL, GEERDES ([id number]) [id number] Page 1 of 1 Visit Report for 07/22/2022 SuperBill Details Patient Name: Date of Service: [name] [name], [name] [name] 07/22/2022 Medical Record Number: [id number] Patient Account Number: [id number] Date of Birth/Sex: Treating RN: Aug 23, 1965 (57 y.o. 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, right ankle and foot E10.621 Type 1 diabetes mellitus with foot ulcer Facility Procedures CPT4 Code Description Modifier Quantity [id number] G0277-(Facility Use Only) HBOT full body chamber, , 4 ICD-10 Diagnosis Description E10.621 Type 1 diabetes mellitus with foot ulcer L97.514 Non-pressure chronic ulcer of other part of right foot with necrosis of bone M86.671 Other chronic osteomyelitis, right ankle and foot Physician Procedures Quantity CPT4 Code Description Modifier [id number] - WC PHYS HYPERBARIC OXYGEN THERAPY 1 ICD-10 Diagnosis Description E10.621 Type 1 diabetes mellitus with foot ulcer L97.514 Non-pressure chronic ulcer of other part of right foot with necrosis of bone M86.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

## 2022-07-26 ENCOUNTER — Encounter (HOSPITAL_BASED_OUTPATIENT_CLINIC_OR_DEPARTMENT_OTHER): Payer: BC Managed Care – PPO | Admitting: Internal Medicine

## 2022-07-26 DIAGNOSIS — E10621 Type 1 diabetes mellitus with foot ulcer: Secondary | ICD-10-CM

## 2022-07-26 DIAGNOSIS — M86671 Other chronic osteomyelitis, right ankle and foot: Secondary | ICD-10-CM

## 2022-07-26 DIAGNOSIS — L97514 Non-pressure chronic ulcer of other part of right foot with necrosis of bone: Secondary | ICD-10-CM | POA: Diagnosis not present

## 2022-07-26 LAB — GLUCOSE, CAPILLARY
Glucose-Capillary: 299 mg/dL — ABNORMAL HIGH (ref 70–99)
Glucose-Capillary: 138 mg/dL — ABNORMAL HIGH (ref 70–99)

## 2022-07-26 NOTE — Progress Notes (Signed)
ZOHARA, MCGREEVY ([id number]) [id number] Page 1 of 1 Visit Report for 07/26/2022 Arrival Information Details Patient Name: Date of Service: [name] [name], [name] [name] 07/26/2022 8:00 A M Medical Record Number: [id number] Patient Account Number: [id number] Date of Birth/Sex: Treating RN: 1965-08-09 (57 y.o. [name] Primary Care Niyonna Betsill: [name] Other Clinician: Haywood Pao Referring Niklas Chretien: Treating Jamariyah Johannsen/Extender: [name], [name]: 2 Visit Information History Since Last Visit All ordered tests and consults were completed: Yes Patient Arrived: Ambulatory Added or deleted any medications: No Arrival Time: 07:30 Any new allergies or adverse reactions: No Accompanied By: Spouse Had a fall or experienced change in No Transfer Assistance: None activities of daily living that may affect Patient Identification Verified: Yes risk of falls: Secondary Verification Process Completed: Yes Signs or symptoms of abuse/neglect since last visito No Hospitalized since last visit: No Implantable device outside of the clinic excluding No cellular tissue based products placed in the center since last visit: Pain Present Now: No Electronic Signature(s) Signed: 07/26/2022 9:27:13 AM By: Haywood Pao CHT EMT BS , , Entered By: Haywood Pao on 07/26/2022 09:27:13

## 2022-07-26 NOTE — Progress Notes (Addendum)
[name], [name] ([id number]) [id number] Page 1 of 2 Visit Report for 07/26/2022 HBO Details Patient Name: Date of Service: [name] [name], [name] [name] 07/26/2022 8:00 A M Medical Record Number: [id number] Patient Account Number: [id number] Date of Birth/Sex: Treating RN: 08-04-1965 (56 y.o. [name] [name], [name] [name] Primary Care Pessy Delamar: [name] Other Clinician: Haywood Pao Referring Quintara Bost: Treating [place]anyika Bowlds/Extender: Caleen Essex GESH Weeks in Treatment: 2 HBO Treatment Course Details Treatment Course Number: 2 Ordering Chancie Lampert: [name] T Treatments Ordered: otal 40 HBO Treatment Start Date: 07/18/2022 HBO Indication: Diabetic Ulcer(s) of the Lower Extremity Standard/Conservative Wound Care tried and failed greater than or equal to 30 days Wound #4 Right Calcaneus HBO Treatment Details Treatment Number: 6 Patient Type: Outpatient Chamber Type: Monoplace Chamber Serial #: [id number] Treatment Protocol: 2.0 ATA with 90 minutes oxygen, with two 5 minute air breaks Treatment Details Compression Rate Down: 1.0 psi / minute De-Compression Rate Up: 1.5 psi / minute A breaks and breathing ir Compress Tx Pressure periods Decompress Decompress Begins Reached (leave unused spaces Begins Ends blank) Chamber Pressure (ATA 1 2 2 2 2 2  --2 1 ) Clock Time (24 hr) 08:07 08:20 08:50 08:55 09:25 09:30 - - 10:00 10:11 Treatment Length: 124 (minutes) Treatment Segments: 4 Vital Signs Capillary Blood Glucose Reference Range: 80 - 120 mg / dl HBO Diabetic Blood Glucose Intervention Range: <131 mg/dl or >086 mg/dl Type: Time Vitals Blood Pulse: Respiratory Capillary Blood Glucose Pulse Action Temperature: Taken: Pressure: Rate: Glucose (mg/dl): Meter #: Oximetry (%) Taken: Pre 07:42 117/82 73 18 97.7 299 1 informed physician of CBG Post 10:15 129/80 72 18 97.3 138 1 none per protocol Treatment Response Treatment Toleration: Well Treatment  Completion Status: Treatment Completed without Adverse Event Treatment Notes Mrs. Mansouri arrived with normal vital signs with exception of 299 mg/dL blood glucose level. Patient stated that she ate some toast and half of a banana for breakfast and self-administered 4 units of Humalog this morning. She prepared for treatment. Dr. Mikey Bussing was informed of blood glucose level. Patient denied symptoms of hyperglycemia. Informed patient that she should follow up with her endocrinologist for blood glucose levels. After performing safety check, she was placed in the chamber which was compressed with 100% oxygen at a rate of 1.5 psi/min after confirming normal ear equalization. She tolerated the treatment and subsequent decompression at rate set of approximately 1.75 psi/min. She denied any issues with ear equalization and/or pain. Post-treatment vital signs were within normal range. She was stable upon discharge. Additional Procedure Documentation Tissue Sevierity: Necrosis of bone Physician HBO Attestation: I certify that I supervised this HBO treatment in accordance with Medicare guidelines. A trained emergency response team is readily available per Yes hospital policies and procedures. Continue HBOT as ordered. Yes Electronic Signature(s) Signed: 07/26/2022 3:42:44 PM By: [name] DO Previous Signature: 07/26/2022 12:18:23 PM Version By: Haywood Pao CHT EMT BS , , Previous Signature: 07/26/2022 11:35:26 AM Version By: Haywood Pao CHT EMT BS , , Previous Signature: 07/26/2022 9:55:37 AM Version By: Haywood Pao CHT EMT BS , , Entered By: [name] on 07/26/2022 15:36:01 [name] ([id number]) [id number] Page 2 of 2 -------------------------------------------------------------------------------- HBO Safety Checklist Details Patient Name: Date of Service: [name] [name], [name] [name] 07/26/2022 8:00 A M Medical Record Number: [id number] Patient  Account Number: [id number] Date of Birth/Sex: Treating RN: November 01, 1965 (57 y.o. [name] Primary Care Jomel Whittlesey: [name] Other Clinician: Haywood Pao Referring Raksha Wolfgang: Treating Ashanta Amoroso/Extender: [name], YO GESH  Weeks in Treatment: 2 HBO Safety Checklist Items Safety Checklist Consent Form Signed Patient voided / foley secured and emptied When did you last eato 0700 Last dose of injectable or oral agent 0700 Ostomy pouch emptied and vented if applicable NA All implantable devices assessed, documented and approved NA Intravenous access site secured and place NA Valuables secured Linens and cotton and cotton/polyester blend (less than 51% polyester) Personal oil-based products / skin lotions / body lotions removed Wigs or hairpieces removed NA Smoking or tobacco materials removed NA Books / newspapers / magazines / loose paper removed Cologne, aftershave, perfume and deodorant removed Jewelry removed (may wrap wedding band) Make-up removed Hair care products removed Battery operated devices (external) removed Heating patches and chemical warmers removed Titanium eyewear removed Nail polish cured greater than 10 hours greater than 10 hours Casting material cured greater than 10 hours NA Hearing aids removed NA Loose dentures or partials removed NA Prosthetics have been removed NA Patient demonstrates correct use of air break device (if applicable) Patient concerns have been addressed Patient grounding bracelet on and cord attached to chamber Specifics for Inpatients (complete in addition to above) Medication sheet sent with patient NA Intravenous medications needed or due during therapy sent with patient NA Drainage tubes (e.g. nasogastric tube or chest tube secured and vented) NA Endotracheal or Tracheotomy tube secured NA Cuff deflated of air and inflated with saline NA Airway suctioned NA Notes Paper version used  prior to treatment start. Electronic Signature(s) Signed: 07/26/2022 9:54:39 AM By: Haywood Pao CHT EMT BS , , Entered By: Haywood Pao on 07/26/2022 09:54:38

## 2022-07-26 NOTE — Progress Notes (Signed)
VALENDA, MCLEARY ([id number]) [id number] Page 1 of 1 Visit Report for 07/26/2022 SuperBill Details Patient Name: Date of Service: [name] [name], [name] [name] 07/26/2022 Medical Record Number: [id number] Patient Account Number: [id number] Date of Birth/Sex: Treating RN: [birth year] (57 y.o. 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 Other chronic osteomyelitis, right ankle and foot E10.621 Type 1 diabetes mellitus with foot ulcer Facility Procedures CPT4 Code Description Modifier Quantity [id number] G0277-(Facility Use Only) HBOT full body chamber, , 4 ICD-10 Diagnosis Description E10.621 Type 1 diabetes mellitus with foot ulcer L97.514 Non-pressure chronic ulcer of other part of right foot with necrosis of bone M86.671 Other chronic osteomyelitis, right ankle and foot Physician Procedures Quantity CPT4 Code Description Modifier [id number] 99183 - WC PHYS HYPERBARIC OXYGEN THERAPY 1 ICD-10 Diagnosis Description E10.621 Type 1 diabetes mellitus with foot ulcer L97.514 Non-pressure chronic ulcer of other part of right foot with necrosis of bone M86.671 Other chronic osteomyelitis, right ankle and foot Electronic Signature(s) Signed: 07/26/2022 11:35:46 AM By: Haywood Pao CHT EMT BS , , Signed: 07/26/2022 3:42:44 PM By: [name] [name] Entered By: Haywood Pao on 07/26/2022 11:35:46

## 2022-07-27 ENCOUNTER — Encounter (HOSPITAL_BASED_OUTPATIENT_CLINIC_OR_DEPARTMENT_OTHER): Payer: BC Managed Care – PPO | Admitting: Physician Assistant

## 2022-07-27 DIAGNOSIS — E10621 Type 1 diabetes mellitus with foot ulcer: Secondary | ICD-10-CM | POA: Diagnosis not present

## 2022-07-27 LAB — GLUCOSE, CAPILLARY
Glucose-Capillary: 218 mg/dL — ABNORMAL HIGH (ref 70–99)
Glucose-Capillary: 158 mg/dL — ABNORMAL HIGH (ref 70–99)

## 2022-07-27 NOTE — Progress Notes (Signed)
NATOSHA, PADGET ([id number]) [id number] Page 1 of 2 Visit Report for 07/27/2022 HBO Details Patient Name: Date of Service: KARISSA, BRUESCH 07/27/2022 8:00 A M Medical Record Number: [id number] Patient Account Number: [id number] Date of Birth/Sex: Treating RN: 1965-04-25 (57 y.o. [name], [name] Primary Care Ayzia Day: [name] Other Clinician: Haywood Pao Referring Kenly Henckel: Treating Norell Brisbin/Extender: Caleen Essex GESH Weeks in Treatment: 2 HBO Treatment Course Details Treatment Course Number: 2 Ordering Rusty Villella: [name] T Treatments Ordered: otal 40 HBO Treatment Start Date: 07/18/2022 HBO Indication: Diabetic Ulcer(s) of the Lower Extremity Standard/Conservative Wound Care tried and failed greater than or equal to 30 days Wound #4 Right Calcaneus HBO Treatment Details Treatment Number: 7 Patient Type: Outpatient Chamber Type: Monoplace Chamber Serial #: [id number] Treatment Protocol: 2.0 ATA with 90 minutes oxygen, with two 5 minute air breaks Treatment Details Compression Rate Down: 1.5 psi / minute De-Compression Rate Up: 1.5 psi / minute A breaks and breathing ir Compress Tx Pressure periods Decompress Decompress Begins Reached (leave unused spaces Begins Ends blank) Chamber Pressure (ATA 1 2 2 2 2 2  --2 1 ) Clock Time (24 hr) 07:58 08:12 08:42 08:47 09:17 09:22 - - 09:52 10:04 Treatment Length: 126 (minutes) Treatment Segments: 4 Vital Signs Capillary Blood Glucose Reference Range: 80 - 120 mg / dl HBO Diabetic Blood Glucose Intervention Range: <131 mg/dl or >782 mg/dl Type: Time Vitals Blood Respiratory Capillary Blood Glucose Pulse Action Pulse: Temperature: Taken: Pressure: Rate: Glucose (mg/dl): Meter #: Oximetry (%) Taken: Pre 07:48 134/85 70 18 97.5 218 1 none per protocol Post 10:07 142/83 71 18 97 158 1 none per protocol Treatment Response Treatment Toleration: Well Treatment Completion  Status: Treatment Completed without Adverse Event Treatment Notes Mrs. Darwish arrived with normal vital signs. Patient stated that she ate a fruit bar this morning. She prepared for treatment. After performing safety check, she was placed in the chamber which was compressed with 100% oxygen at a rate of 1.5 psi/min after confirming normal ear equalization. She tolerated the treatment and subsequent decompression at rate set of approximately 1.75 psi/min. She denied any issues with ear equalization and/or pain associated with barotrauma. Post-treatment vital signs were within normal range. She was stable upon discharge. Additional Procedure Documentation Tissue Sevierity: Necrosis of bone Electronic Signature(s) Signed: 07/27/2022 11:11:47 AM By: Haywood Pao CHT EMT BS , , Previous Signature: 07/27/2022 9:58:08 AM Version By: Haywood Pao CHT EMT BS , , Entered By: Haywood Pao on 07/27/2022 11:11:47 -------------------------------------------------------------------------------- HBO Safety Checklist Details Patient Name: Date of Service: [name] 07/27/2022 8:00 A M Medical Record Number: [id number] Patient Account Number: [id number] Date of Birth/Sex: Treating RN: 1965-11-16 (57 y.o. [name] Primary Care Natacia Chaisson: [name] Other Clinician: Cidney, Naff ([id number]) [id number] Page 2 of 2 Referring Latrice Storlie: Treating Pennelope Basque/Extender: [name], [name]: 2 HBO Safety Checklist Items Safety Checklist Consent Form Signed Patient voided / foley secured and emptied When did you last eato 0700 Last dose of injectable or oral agent 0700 Ostomy pouch emptied and vented if applicable NA All implantable devices assessed, documented and approved NA Intravenous access site secured and place NA Valuables secured Linens and cotton and cotton/polyester blend (less than 51%  polyester) Personal oil-based products / skin lotions / body lotions removed Wigs or hairpieces removed NA Smoking or tobacco materials removed NA Books / newspapers / magazines / loose paper removed Cologne, aftershave, perfume and deodorant  removed Jewelry removed (may wrap wedding band) Make-up removed Hair care products removed Battery operated devices (external) removed Heating patches and chemical warmers removed Titanium eyewear removed Nail polish cured greater than 10 hours greater than 10 hours Casting material cured greater than 10 hours NA Hearing aids removed NA Loose dentures or partials removed NA Prosthetics have been removed NA Patient demonstrates correct use of air break device (if applicable) Patient concerns have been addressed Patient grounding bracelet on and cord attached to chamber Specifics for Inpatients (complete in addition to above) Medication sheet sent with patient NA Intravenous medications needed or due during therapy sent with patient NA Drainage tubes (e.g. nasogastric tube or chest tube secured and vented) NA Endotracheal or Tracheotomy tube secured NA Cuff deflated of air and inflated with saline NA Airway suctioned NA Notes Paper version used prior to treatment start. Electronic Signature(s) Signed: 07/27/2022 9:54:23 AM By: Haywood Pao CHT EMT BS , , Entered By: Haywood Pao on 07/27/2022 09:54:23

## 2022-07-28 ENCOUNTER — Encounter (HOSPITAL_BASED_OUTPATIENT_CLINIC_OR_DEPARTMENT_OTHER): Payer: BC Managed Care – PPO | Admitting: Internal Medicine

## 2022-07-28 DIAGNOSIS — M86671 Other chronic osteomyelitis, right ankle and foot: Secondary | ICD-10-CM

## 2022-07-28 DIAGNOSIS — L97514 Non-pressure chronic ulcer of other part of right foot with necrosis of bone: Secondary | ICD-10-CM | POA: Diagnosis not present

## 2022-07-28 DIAGNOSIS — E10621 Type 1 diabetes mellitus with foot ulcer: Secondary | ICD-10-CM

## 2022-07-28 LAB — GLUCOSE, CAPILLARY
Glucose-Capillary: 200 mg/dL — ABNORMAL HIGH (ref 70–99)
Glucose-Capillary: 368 mg/dL — ABNORMAL HIGH (ref 70–99)

## 2022-07-28 NOTE — Progress Notes (Signed)
IDELL, DUQUAINE ([id number]) [id number] Page 1 of 1 Visit Report for 07/27/2022 SuperBill Details Patient Name: Date of Service: [name] [name], [name] [name] 07/27/2022 Medical Record Number: [id number] Patient Account Number: [id number] Date of Birth/Sex: Treating RN: 04-23-1965 (57 y.o. 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 Other chronic osteomyelitis, right ankle and foot E10.621 Type 1 diabetes mellitus with foot ulcer Facility Procedures CPT4 Code Description Modifier Quantity [id number] G0277-(Facility Use Only) HBOT full body chamber, , 4 ICD-10 Diagnosis Description E10.621 Type 1 diabetes mellitus with foot ulcer L97.514 Non-pressure chronic ulcer of other part of right foot with necrosis of bone M86.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

## 2022-07-29 ENCOUNTER — Encounter (HOSPITAL_BASED_OUTPATIENT_CLINIC_OR_DEPARTMENT_OTHER): Payer: BC Managed Care – PPO | Admitting: Internal Medicine

## 2022-07-29 ENCOUNTER — Ambulatory Visit (INDEPENDENT_AMBULATORY_CARE_PROVIDER_SITE_OTHER): Payer: BC Managed Care – PPO

## 2022-07-29 ENCOUNTER — Ambulatory Visit (INDEPENDENT_AMBULATORY_CARE_PROVIDER_SITE_OTHER): Payer: BC Managed Care – PPO | Admitting: Podiatry

## 2022-07-29 DIAGNOSIS — M86071 Acute hematogenous osteomyelitis, right ankle and foot: Secondary | ICD-10-CM | POA: Diagnosis not present

## 2022-07-29 NOTE — Progress Notes (Addendum)
JAQUITA, MOSSHOLDER ([id number]) [id number] Page 1 of 2 Visit Report for 07/28/2022 HBO Details Patient Name: Date of Service: [name], [name] 07/28/2022 8:00 A M Medical Record Number: [id number] Patient Account Number: [id number] Date of Birth/Sex: Treating RN: [birth year] (57 y.o. [name] [name] Primary Care [name] [name]: [name] [name] Other Clinician: Karl [name] Referring [name] [name]: Treating [name] [name]/Extender: Caleen Essex GESH Weeks in Treatment: 2 HBO Treatment Course Details Treatment Course Number: 2 Ordering [name] [name]: [name] [name] T Treatments Ordered: otal 40 HBO Treatment Start Date: 07/18/2022 HBO Indication: Diabetic Ulcer(s) of the Lower Extremity Standard/Conservative Wound Care tried and failed greater than or equal to 30 days Wound #4 Right Calcaneus HBO Treatment Details Treatment Number: 8 Patient Type: Outpatient Chamber Type: Monoplace Chamber Serial #: [id number] Treatment Protocol: 2.0 ATA with 90 minutes oxygen, with two 5 minute air breaks Treatment Details Compression Rate Down: 2.0 psi / minute De-Compression Rate Up: 2.0 psi / minute A breaks and breathing ir Compress Tx Pressure periods Decompress Decompress Begins Reached (leave unused spaces Begins Ends blank) Chamber Pressure (ATA 1 2 2 2 2 2  --2 1 ) Clock Time (24 hr) 08:04 08:13 08:43 08:48 09:18 09:23 - - 09:53 10:01 Treatment Length: 117 (minutes) Treatment Segments: 4 Vital Signs Capillary Blood Glucose Reference Range: 80 - 120 mg / dl HBO Diabetic Blood Glucose Intervention Range: <131 mg/dl or >782 mg/dl Type: Time Vitals Blood Pulse: Respiratory Temperature: Capillary Blood Glucose Pulse Action Taken: Pressure: Rate: Glucose (mg/dl): Meter #: Oximetry (%) Taken: Pre 08:02 162/88 74 16 97.6 200 Post 10:07 152/92 75 16 97.1 368 Dr. Mikey Bussing informed of blood sugar Treatment Response Treatment Toleration: Well Treatment Completion Status:  Treatment Completed without Adverse Event Additional Procedure Documentation Tissue Sevierity: Necrosis of bone Physician HBO Attestation: I certify that I supervised this HBO treatment in accordance with Medicare guidelines. A trained emergency response team is readily available per Yes hospital policies and procedures. Continue HBOT as ordered. Yes Electronic Signature(s) Signed: 07/28/2022 4:34:47 PM By: [name] Corwin DO Previous Signature: 07/28/2022 11:06:01 AM Version By: [name] [name] EMT Previous Signature: 07/28/2022 12:18:27 PM Version By: [name] Corwin DO Entered By: [name] [name] on 07/28/2022 16:33:24 -------------------------------------------------------------------------------- HBO Safety Checklist Details Patient Name: Date of Service: [name] Milo D. 07/28/2022 8:00 A M Medical Record Number: [id number] Patient Account Number: [id number] [name], [name] ([id number]) [id number].pdf Page 2 of 2 Date of Birth/Sex: Treating RN: October 10, 1965 (57 y.o. [name] [name] Primary Care [name] [name]: Other Clinician: Juanita [name] Referring [name] [name]: Treating [name] [name]/Extender: [name] [name], [name]: 2 HBO Safety Checklist Items Safety Checklist Consent Form Signed Patient voided / foley secured and emptied When did you last eato 0700 Last dose of injectable or oral agent 0700 Ostomy pouch emptied and vented if applicable NA All implantable devices assessed, documented and approved NA Intravenous access site secured and place NA Valuables secured Linens and cotton and cotton/polyester blend (less than 51% polyester) Personal oil-based products / skin lotions / body lotions removed Wigs or hairpieces removed Smoking or tobacco materials removed Books / newspapers / magazines / loose paper removed Cologne, aftershave, perfume and deodorant removed Jewelry removed (may wrap wedding band) Make-up  removed Hair care products removed Battery operated devices (external) removed Libre3 Heating patches and chemical warmers removed Titanium eyewear removed Nail polish cured greater than 10 hours 3 weeks ago Casting material cured greater than 10 hours NA Hearing aids removed NA Loose dentures or partials removed  NA Prosthetics have been removed NA Patient demonstrates correct use of air break device (if applicable) Patient concerns have been addressed Patient grounding bracelet on and cord attached to chamber Specifics for Inpatients (complete in addition to above) Medication sheet sent with patient NA Intravenous medications needed or due during therapy sent with patient NA Drainage tubes (e.g. nasogastric tube or chest tube secured and vented) NA Endotracheal or Tracheotomy tube secured NA Cuff deflated of air and inflated with saline NA Airway suctioned NA Notes The safety checklist was done before the treatment was started. Electronic Signature(s) Signed: 07/28/2022 11:03:14 AM By: [name] [name] EMT Entered By: [name] [name] on 07/28/2022 11:03:13

## 2022-07-29 NOTE — Progress Notes (Signed)
KENZA, LEMOS ([id number]) [id number] Page 1 of 2 Visit Report for 07/28/2022 Problem List Details Patient Name: Date of Service: [name] [name], [name] [name] 07/28/2022 8:00 A M Medical Record Number: [id number] Patient Account Number: [id number] Date of Birth/Sex: Treating RN: [birth year] (57 y.o. [name] Primary Care Provider: [name] Other Clinician: Karl Bales Referring Provider: Treating Provider/Extender: [name], [name]: 2 Active Problems ICD-10 Encounter Code Description Active Date MDM Diagnosis L97.514 Non-pressure chronic ulcer of other part of right foot with 07/12/2022 No Yes necrosis of bone M86.671 Other chronic osteomyelitis, right ankle and foot 07/12/2022 No Yes E10.621 Type 1 diabetes mellitus with foot ulcer 07/12/2022 No Yes Inactive Problems Resolved Problems Electronic Signature(s) Signed: 07/28/2022 11:06:39 AM By: Karl Bales EMT Signed: 07/28/2022 12:18:27 PM By: [name] DO Entered By: Karl Bales on 07/28/2022 11:06:38 -------------------------------------------------------------------------------- SuperBill Details Patient Name: Date of Service: [name] [name], [name] [name] 07/28/2022 Medical Record Number: [id number] Patient Account Number: [id number] Date of Birth/Sex: Treating RN: [birth year] (57 y.o. 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 Other chronic osteomyelitis, right ankle and foot E10.621 Type 1 diabetes mellitus with foot ulcer Facility Procedures Physician Procedures : CPT4 Code Description Modifier [id number] - WC PHYS HYPERBARIC OXYGEN THERAPY ICD-10 Diagnosis  Description E10.621 Type 1 diabetes mellitus with foot ulcer L97.514 Non-pressure chronic ulcer of other part of right foot with necrosis o M86.671 Other  chronic osteomyelitis, right ankle and foot Quantity: 1 f bone Electronic Signature(s) Signed: 07/28/2022 11:06:33 AM By: Karl Bales EMT Signed: 07/28/2022 12:18:27 PM By: [name] DO Entered By: Karl Bales on 07/28/2022 11:06:33

## 2022-08-01 ENCOUNTER — Encounter (HOSPITAL_BASED_OUTPATIENT_CLINIC_OR_DEPARTMENT_OTHER): Payer: BC Managed Care – PPO | Admitting: Internal Medicine

## 2022-08-01 ENCOUNTER — Encounter (HOSPITAL_BASED_OUTPATIENT_CLINIC_OR_DEPARTMENT_OTHER): Payer: BC Managed Care – PPO | Attending: Internal Medicine | Admitting: Internal Medicine

## 2022-08-01 DIAGNOSIS — L97514 Non-pressure chronic ulcer of other part of right foot with necrosis of bone: Secondary | ICD-10-CM | POA: Diagnosis present

## 2022-08-01 DIAGNOSIS — E10621 Type 1 diabetes mellitus with foot ulcer: Secondary | ICD-10-CM | POA: Diagnosis not present

## 2022-08-01 DIAGNOSIS — M86671 Other chronic osteomyelitis, right ankle and foot: Secondary | ICD-10-CM | POA: Insufficient documentation

## 2022-08-01 LAB — GLUCOSE, CAPILLARY
Glucose-Capillary: 86 mg/dL (ref 70–99)
Glucose-Capillary: 115 mg/dL — ABNORMAL HIGH (ref 70–99)
Glucose-Capillary: 246 mg/dL — ABNORMAL HIGH (ref 70–99)

## 2022-08-01 NOTE — Progress Notes (Signed)
ELBIA, LAFOUNTAIN ([id number]) [id number] Page 1 of 2 Visit Report for 08/01/2022 HBO Details Patient Name: Date of Service: [name] [name], [name] [name] 08/01/2022 11:00 A M Medical Record Number: [id number] Patient Account Number: [id number] Date of Birth/Sex: Treating RN: [birth year] (57 y.o. [name] [name], [name] [name] Primary Care [name] [name]: [name] [name] Other Clinician: Haywood Pao Referring [name] [name]: Treating [name] [name]/Extender: [name] [name] [name] [name] Weeks in Treatment: 2 HBO Treatment Course Details Treatment Course Number: 2 Ordering [name] [name]: [name] [name] T Treatments Ordered: otal 40 HBO Treatment Start Date: 07/18/2022 HBO Indication: Diabetic Ulcer(s) of the Lower Extremity Standard/Conservative Wound Care tried and failed greater than or equal to 30 days Wound #4 Right Calcaneus HBO Treatment Details Treatment Number: 9 Patient Type: Outpatient Chamber Type: Monoplace Chamber Serial #: [id number] Treatment Protocol: 2.0 ATA with 90 minutes oxygen, with two 5 minute air breaks Treatment Details Compression Rate Down: 1.5 psi / minute De-Compression Rate Up: 1.5 psi / minute A breaks and breathing ir Compress Tx Pressure periods Decompress Decompress Begins Reached (leave unused spaces Begins Ends blank) Chamber Pressure (ATA 1 2 2 2 2 2  --2 1 ) Clock Time (24 hr) 12:24 12:40 13:10 13:15 13:45 13:50 - - 14:20 14:30 Treatment Length: 126 (minutes) Treatment Segments: 4 Vital Signs Capillary Blood Glucose Reference Range: 80 - 120 mg / dl HBO Diabetic Blood Glucose Intervention Range: <131 mg/dl or >578 mg/dl Type: Time Vitals Blood Pulse: Respiratory Temperature: Capillary Blood Glucose Pulse Action Taken: Pressure: Rate: Glucose (mg/dl): Meter #: Oximetry (%) Taken: Pre 11:16 120/69 86 18 97.3 86 2 Given 8 oz Glucerna and 2 Juice per protocol/order Pre 12:04 115 2 Cleared for HBOT per protocol Post 14:34 130/91 76 18 98.2 246 2 none per  protocol. Treatment Response Treatment Toleration: Well Treatment Completion Status: Treatment Completed without Adverse Event Treatment Notes Mrs. [name] [name] arrived after wound care visit with a low glucose reading with her Bouton 3 device. She states that for breakfast she ate toast with peanut butter and fruit. Prior to arriving she ate a pack of Lance Peanut Butter Crackers. Patient's blood glucose was measured with glucometer at 86 mg/dL. She was given an 8 oz Glucerna per protocol and two 4 oz apple juices according to physician's order. At 1204, blood glucose remeasured at 115 mg/dL. Informed physician of blood glucose level interventions. Patient's glucose does drop some during treatment, therefore, 8 oz of apple juice was sent in with patient contained in empty water bottle for safety. After performing a safety check, patient was placed in the chamber which was compressed with 100% oxygen at a rate of 1.5 psi/min after confirming normal ear equalization at 10 psig. She tolerated treatment and subsequent decompression of the chamber at 1.5 psi/min without any issues with ear equalization and/or pain associated with barotrauma. She was stable upon discharge with her spouse. Additional Procedure Documentation [name] [name]: Necrosis of bone [name] [name] Notes No concerns with treatment given. Patient was also seen for wound care evaluation Physician HBO Attestation: I certify that I supervised this HBO treatment in accordance with Medicare guidelines. A trained emergency response team is readily available per Yes hospital policies and procedures. Continue HBOT as ordered. [address] [name] [name], [name] [name] ([id number]) [id number] Page 2 of 2 Signed: 08/02/2022 7:23:00 AM By: Baltazar Najjar MD Previous Signature: 08/01/2022 3:01:53 PM Version By: Haywood Pao CHT EMT BS , , Previous Signature: 08/01/2022 2:12:22 PM Version By: Haywood Pao CHT EMT BS ,  , Previous Signature: 08/01/2022 2:08:16 PM Version  By: Haywood Pao CHT EMT BS , , Previous Signature: 08/01/2022 2:03:15 PM Version By: Haywood Pao CHT EMT BS , , Previous Signature: 08/01/2022 2:00:42 PM Version By: Haywood Pao CHT EMT BS , , Entered By: Baltazar Najjar on 08/01/2022 15:36:05 -------------------------------------------------------------------------------- HBO Safety Checklist Details Patient Name: Date of Service: [name] Milo D. 08/01/2022 11:00 A M Medical Record Number: [id number] Patient Account Number: [id number] Date of Birth/Sex: Treating RN: 13-Sep-1965 (57 y.o. [name] [name], [name] [name] Primary Care [name]: [name] [name] Other Clinician: Haywood Pao Referring [name] [name]: Treating [name] [name]/Extender: Deberah Pelton, YO [name] [name] Weeks in Treatment: 2 HBO Safety Checklist Items Safety Checklist Consent Form Signed Patient voided / foley secured and emptied When did you last eato 1100 Last dose of injectable or oral agent 0900 5 units Ostomy pouch emptied and vented if applicable NA All implantable devices assessed, documented and approved Intravenous access site secured and place NA Valuables secured Linens and cotton and cotton/polyester blend (less than 51% polyester) Personal oil-based products / skin lotions / body lotions removed Wigs or hairpieces removed NA Smoking or tobacco materials removed NA Books / newspapers / magazines / loose paper removed Cologne, aftershave, perfume and deodorant removed Jewelry removed (may wrap [name] [name] band) Make-up removed Hair care products removed Battery operated devices (external) removed Heating patches and chemical warmers removed Titanium eyewear removed Nail polish cured greater than 10 hours greater than 10 hours Casting material cured greater than 10 hours NA Hearing aids removed NA Loose dentures or partials removed NA Prosthetics have been removed NA Patient demonstrates  correct use of air break device (if applicable) Patient concerns have been addressed Patient grounding bracelet on and cord attached to chamber Specifics for Inpatients (complete in addition to above) Medication sheet sent with patient NA Intravenous medications needed or due during therapy sent with patient NA Drainage tubes (e.g. nasogastric tube or chest tube secured and vented) NA Endotracheal or Tracheotomy tube secured NA Cuff deflated of air and inflated with saline NA Airway suctioned NA Notes Paper version used prior to treatment start. Electronic Signature(s) Signed: 08/01/2022 1:34:06 PM By: Haywood Pao CHT EMT BS , , Entered By: Haywood Pao on 08/01/2022 13:34:06

## 2022-08-02 ENCOUNTER — Encounter (HOSPITAL_BASED_OUTPATIENT_CLINIC_OR_DEPARTMENT_OTHER): Payer: BC Managed Care – PPO | Admitting: Internal Medicine

## 2022-08-02 DIAGNOSIS — E10621 Type 1 diabetes mellitus with foot ulcer: Secondary | ICD-10-CM | POA: Diagnosis not present

## 2022-08-02 LAB — GLUCOSE, CAPILLARY
Glucose-Capillary: 201 mg/dL — ABNORMAL HIGH (ref 70–99)
Glucose-Capillary: 113 mg/dL — ABNORMAL HIGH (ref 70–99)

## 2022-08-02 NOTE — Progress Notes (Signed)
CARMYN, SPOONAMORE ([id number]) [id number] Page 1 of 1 Visit Report for 08/01/2022 SuperBill Details Patient Name: Date of Service: [name] [name], [name] [name] 08/01/2022 Medical Record Number: [id number] Patient Account Number: [id number] Date of Birth/Sex: Treating RN: 1965-08-30 (57 y.o. 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, right ankle and foot E10.621 Type 1 diabetes mellitus with foot ulcer Facility Procedures CPT4 Code Description Modifier Quantity [id number] G0277-(Facility Use Only) HBOT full body chamber, , 4 ICD-10 Diagnosis Description E10.621 Type 1 diabetes mellitus with foot ulcer L97.514 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

## 2022-08-03 ENCOUNTER — Encounter (HOSPITAL_BASED_OUTPATIENT_CLINIC_OR_DEPARTMENT_OTHER): Payer: BC Managed Care – PPO | Admitting: Physician Assistant

## 2022-08-03 DIAGNOSIS — E10621 Type 1 diabetes mellitus with foot ulcer: Secondary | ICD-10-CM | POA: Diagnosis not present

## 2022-08-03 LAB — GLUCOSE, CAPILLARY: Glucose-Capillary: 99 mg/dL (ref 70–99)

## 2022-08-03 NOTE — Progress Notes (Signed)
[name] [name], [name] [name] ([id number]) [id number] Page 1 of 2 Visit Report for 08/02/2022 HBO Details Patient Name: Date of Service: [name] [name], [name] [name] 08/02/2022 10:00 A M Medical Record Number: [id number] Patient Account Number: [id number] Date of Birth/Sex: Treating RN: [name] (57 y.o. [name] [name], [name] [name] Primary Care [name] [name]: [name] [name] Other Clinician: Referring [name] [name]: Treating [name] [name]/Extender: [name] [name] [name] Weeks in Treatment: 3 HBO Treatment Course Details Treatment Course Number: 2 Ordering [name] [name]: [name] [name] T Treatments Ordered: otal 40 HBO Treatment Start Date: 07/18/2022 HBO Indication: Diabetic Ulcer(s) of the Lower Extremity Standard/Conservative Wound Care tried and failed greater than or equal to 30 days Wound #4 Right Calcaneus HBO Treatment Details Treatment Number: 10 Patient Type: Outpatient Chamber Type: Monoplace Chamber Serial #: [id number] Treatment Protocol: 2.0 ATA with 90 minutes oxygen, with two 5 minute air breaks Treatment Details Compression Rate Down: 1.5 psi / minute De-Compression Rate Up: 1.5 psi / minute A breaks and breathing ir Compress Tx Pressure periods Decompress Decompress Begins Reached (leave unused spaces Begins Ends blank) Chamber Pressure (ATA 1 2 2 2 2 2  --2 1 ) Clock Time (24 hr) 10:11 10:22 10:53 10:58 11:28 11:33 - - 12:04 12:11 Treatment Length: 120 (minutes) Treatment Segments: 4 Vital Signs Capillary Blood Glucose Reference Range: 80 - 120 mg / dl HBO Diabetic Blood Glucose Intervention Range: <131 mg/dl or >962 mg/dl Time Vitals Blood Respiratory Capillary Blood Glucose Pulse Action Type: Pulse: Temperature: Taken: Pressure: Rate: Glucose (mg/dl): Meter #: Oximetry (%) Taken: Pre 09:40 112/78 75 18 97.2 201 Post 12:18 128/78 72 18 97.2 113 Treatment Response Treatment Toleration: Well Treatment Completion Status: Treatment Completed without Adverse  Event [name] [name] Notes No concerns with treatment given Physician HBO Attestation: I certify that I supervised this HBO treatment in accordance with Medicare guidelines. A trained emergency response team is readily available per Yes hospital policies and procedures. Continue HBOT as ordered. Yes Electronic Signature(s) Signed: 08/02/2022 4:47:09 PM By: [name] Najjar MD Previous Signature: 08/02/2022 3:12:37 PM Version By: [name] Deed RN, BSN Entered By: [name] [name] on 08/02/2022 16:45:47 -------------------------------------------------------------------------------- HBO Safety Checklist Details Patient Name: Date of Service: [name] Milo D. 08/02/2022 10:00 A M Medical Record Number: [id number] Patient Account Number: [id number] Date of Birth/Sex: Treating RN: [name] [name] (57 y.o. [name] [name] [name], [place] D ([id number]) [phone].pdf Page 2 of 2 Primary Care [name] [name]: [name] [name] Other Clinician: Referring [name] [name]: Treating [name] [name]/Extender: [name] [name], [name] [name] Weeks in Treatment: 3 HBO Safety Checklist Items Safety Checklist Consent Form Signed Patient voided / foley secured and emptied When did you last eato 0700 Last dose of injectable or oral agent 0700 Ostomy pouch emptied and vented if applicable NA All implantable devices assessed, documented and approved Intravenous access site secured and place NA Valuables secured Linens and cotton and cotton/polyester blend (less than 51% polyester) Personal oil-based products / skin lotions / body lotions removed Wigs or hairpieces removed Smoking or tobacco materials removed Books / newspapers / magazines / loose paper removed Cologne, aftershave, perfume and deodorant removed Jewelry removed (may wrap wedding band) Make-up removed Hair care products removed Battery operated devices (external) removed Heating patches and chemical warmers removed Titanium eyewear  removed Nail polish cured greater than 10 hours Casting material cured greater than 10 hours NA Hearing aids removed NA Loose dentures or partials removed Prosthetics have been removed NA Patient demonstrates correct use of air break device (if applicable) Patient concerns have been addressed Patient grounding bracelet on and  cord attached to chamber Specifics for Inpatients (complete in addition to above) Medication sheet sent with patient NA Intravenous medications needed or due during therapy sent with patient NA Drainage tubes (e.g. nasogastric tube or chest tube secured and vented) NA Endotracheal or Tracheotomy tube secured NA Cuff deflated of air and inflated with saline NA Airway suctioned NA Electronic Signature(s) Signed: 08/02/2022 3:12:37 PM By: [name] Deed RN, BSN Entered By: [name] [name] on 08/02/2022 12:56:21

## 2022-08-03 NOTE — Progress Notes (Signed)
[name], [name] ([id number]) [id number] Page 1 of 1 Visit Report for 08/02/2022 SuperBill Details Patient Name: Date of Service: [name] [name], [name] [name] 08/02/2022 Medical Record Number: [id number] Patient Account Number: [id number] Date of Birth/Sex: Treating RN: Oct 03, 1965 (57 y.o. [name], [name] Primary Care Provider: [name] Other Clinician: Referring Provider: Treating Provider/Extender: [name] GESH Weeks in Treatment: 3 Diagnosis Coding ICD-10 Codes Code Description [phone] Non-pressure chronic ulcer of other part of right foot with necrosis of bone M86.671 Other chronic osteomyelitis, right ankle and foot E10.621 Type 1 diabetes mellitus with foot ulcer Facility Procedures CPT4 Code Description Modifier Quantity [id number] G0277-(Facility Use Only) HBOT full body chamber, , 4 Physician Procedures Quantity CPT4 Code Description Modifier [id number] 99183 - WC PHYS HYPERBARIC OXYGEN THERAPY 1 ICD-10 Diagnosis Description M86.671 Other chronic osteomyelitis, right ankle and foot E10.621 Type 1 diabetes mellitus with foot ulcer L97.514 Non-pressure chronic ulcer of other part of right foot with necrosis of bone Electronic Signature(s) Signed: 08/02/2022 3:12:37 PM By: Zenaida Deed RN, BSN Signed: 08/02/2022 4:47:09 PM By: Baltazar Najjar MD Entered By: Zenaida Deed on 08/02/2022 12:59:57

## 2022-08-03 NOTE — Progress Notes (Signed)
[name], [name] ([id number]) [id number] Page 1 of 2 Visit Report for 08/03/2022 HBO Details Patient Name: Date of Service: [name] [name], [name] [name] 08/03/2022 10:00 A M Medical Record Number: [id number] Patient Account Number: [id number] Date of Birth/Sex: Treating RN: 07-22-1965 (57 y.o. [name] [name], [name] Primary Care Floriene Jeschke: [name] Other Clinician: Haywood Pao Referring Arvind Mexicano: Treating Odessie Polzin/Extender: Alford Highland, [name]: 3 HBO Treatment Course Details Treatment Course Number: 2 Ordering Shemaiah Round: [name] T Treatments Ordered: otal 40 HBO Treatment Start Date: 07/18/2022 HBO Indication: Diabetic Ulcer(s) of the Lower Extremity Standard/Conservative Wound Care tried and failed greater than or equal to 30 days Wound #4 Right Calcaneus HBO Treatment Details Treatment Number: 11 Patient Type: Outpatient Chamber Type: Monoplace Chamber Serial #: [id number] Treatment Protocol: 2.0 ATA with 90 minutes oxygen, with two 5 minute air breaks Treatment Details Compression Rate Down: 1.0 psi / minute De-Compression Rate Up: 2.0 psi / minute A breaks and breathing ir Compress Tx Pressure periods Decompress Decompress Begins Reached (leave unused spaces Begins Ends blank) Chamber Pressure (ATA 1 2 2 2 2 2  --2 1 ) Clock Time (24 hr) 10:04 10:18 10:48 10:53 11:23 11:28 - - 11:58 12:08 Treatment Length: 124 (minutes) Treatment Segments: 4 Vital Signs Capillary Blood Glucose Reference Range: 80 - 120 mg / dl HBO Diabetic Blood Glucose Intervention Range: <131 mg/dl or >166 mg/dl Type: Time Vitals Blood Pulse: Respiratory Temperature: Capillary Blood Glucose Pulse Action Taken: Pressure: Rate: Glucose (mg/dl): Meter #: Oximetry (%) Taken: Pre 09:36 126/78 75 18 97.3 248 1 none per protocol Post 12:11 140/90 74 18 97.2 99 1 Patient given 8 oz Glucerna Shake, per protocol. Treatment Response Treatment Toleration:  Well Treatment Completion Status: Treatment Completed without Adverse Event Treatment Notes [name] [name] arrived with vital signs within normal limits per protocol. She prepared for treatment. After performing a safety check, patient was placed in the chamber which was compressed with 100% oxygen at a rate of 1.5 psi/min after confirming normal ear equalization. Patient tolerated the treatment and subsequent decompression. She denied any issues with ear equalization and/or pain associated with barotrauma. Her post-treatment vital signs were within normal range with the exception of blood glucose of 99 mg/dL. After 15 minutes patient was asymptomatic for hypoglycemia. She was stable upon discharge with her driver. Additional Procedure Documentation Tissue Sevierity: Necrosis of bone Electronic Signature(s) Signed: 08/03/2022 12:54:47 PM By: Haywood Pao CHT EMT BS , , Signed: 08/03/2022 6:23:56 PM By: Allen Derry PA-C Previous Signature: 08/03/2022 12:54:20 PM Version By: Haywood Pao CHT EMT BS , , Previous Signature: 08/03/2022 11:53:07 AM Version By: Haywood Pao CHT EMT BS , , Entered By: Haywood Pao on 08/03/2022 12:54:47 -------------------------------------------------------------------------------- HBO Safety Checklist Details Patient Name: Date of Service: [name] [name], [name] [name] 08/03/2022 10:00 A Lorayne Bender, Unknown Foley D ([id number]) [id number] Page 2 of 2 Medical Record Number: [id number] Patient Account Number: [id number] Date of Birth/Sex: Treating RN: February 22, 1966 (57 y.o. [name] [name] Primary Care Hargis Vandyne: [name] Other Clinician: Haywood Pao Referring Clifford Coudriet: Treating Krisann Mckenna/Extender: Alford Highland, [name]: 3 HBO Safety Checklist Items Safety Checklist Consent Form Signed Patient voided / foley secured and emptied When did you last eato 0700 Last dose of injectable or oral agent 0700 Ostomy  pouch emptied and vented if applicable NA All implantable devices assessed, documented and approved NA Intravenous access site secured and place NA Valuables secured Linens and cotton and cotton/polyester blend (less  than 51% polyester) Personal oil-based products / skin lotions / body lotions removed Wigs or hairpieces removed NA Smoking or tobacco materials removed NA Books / newspapers / magazines / loose paper removed Cologne, aftershave, perfume and deodorant removed Jewelry removed (may wrap wedding band) Make-up removed Hair care products removed Battery operated devices (external) removed Heating patches and chemical warmers removed Titanium eyewear removed Nail polish cured greater than 10 hours greater than 10 hours Casting material cured greater than 10 hours NA Hearing aids removed NA Loose dentures or partials removed NA Prosthetics have been removed NA Patient demonstrates correct use of air break device (if applicable) Patient concerns have been addressed Patient grounding bracelet on and cord attached to chamber Specifics for Inpatients (complete in addition to above) Medication sheet sent with patient NA Intravenous medications needed or due during therapy sent with patient NA Drainage tubes (e.g. nasogastric tube or chest tube secured and vented) NA Endotracheal or Tracheotomy tube secured NA Cuff deflated of air and inflated with saline NA Airway suctioned NA Notes Paper version used prior to treatment start. Electronic Signature(s) Signed: 08/03/2022 11:47:11 AM By: Haywood Pao CHT EMT BS , , Entered By: Haywood Pao on 08/03/2022 11:47:10

## 2022-08-03 NOTE — Progress Notes (Signed)
Subjective: Chief Complaint  Patient presents with   Osteomyletis    RM 11  7-10 wound check of right. Pt states she is doing well. No questions or anything concerning.      57 year old female presents the office for above concerns.  She is on antibiotics per infectious disease and she is also been going to get daily hyperbaric oxygen therapy treatment.  She is continue with Dakin's solution to the wound daily.    Objective: AAO x3, NAD DP/PT pulses palpable bilaterally, CRT less than 3 seconds Large full-thickness ulceration noted to right heel measuring about the same its mostly 100% granular.  Wound is still edematous more superficial and there is no probing, and or tunneling.  There is no fluctuation, crepitation there is no malodor.  No pain with calf compression, swelling, warmth, erythema  Assessment: 57 year old female with right heel ulceration, osteomyelitis, calcaneal fracture  Plan: -All treatment options discussed with the patient including all alternatives, risks, complications.  -X-rays obtained and reviewed.  3 views of the foot were obtained.  Cortical changes of the posterior calcaneus consistent with osteomyelitis with fracture noted of the posterior calcaneus.  No soft tissue emphysema -At this time she is going to wound care center she is also on antibiotics.  She will continue with Dakin's solution for wound care center for now and I will defer to them for further treatment for the wound however if she is eating at all to let me know. She will see the MD next week she states.  -Monitor for any clinical signs or symptoms of infection and directed to call the office immediately should any occur or go to the ER.  [name] [name] DPM

## 2022-08-04 ENCOUNTER — Encounter (HOSPITAL_BASED_OUTPATIENT_CLINIC_OR_DEPARTMENT_OTHER): Payer: Self-pay

## 2022-08-04 ENCOUNTER — Encounter (HOSPITAL_BASED_OUTPATIENT_CLINIC_OR_DEPARTMENT_OTHER): Payer: BC Managed Care – PPO | Admitting: Internal Medicine

## 2022-08-04 NOTE — Progress Notes (Signed)
KEITLYN, BONNER ([id number]) [id number] Page 1 of 1 Visit Report for 08/03/2022 SuperBill Details Patient Name: Date of Service: [name] [name], [name] [name] 08/03/2022 Medical Record Number: [id number] Patient Account Number: [id number] Date of Birth/Sex: Treating RN: 07-16-1965 (57 y.o. 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 Other chronic osteomyelitis, right ankle and foot E10.621 Type 1 diabetes mellitus with foot ulcer Facility Procedures CPT4 Code Description Modifier Quantity [id number] G0277-(Facility Use Only) HBOT full body chamber, , 4 ICD-10 Diagnosis Description E10.621 Type 1 diabetes mellitus with foot ulcer 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

## 2022-08-05 ENCOUNTER — Encounter (HOSPITAL_BASED_OUTPATIENT_CLINIC_OR_DEPARTMENT_OTHER): Payer: BC Managed Care – PPO | Admitting: Internal Medicine

## 2022-08-05 DIAGNOSIS — E10621 Type 1 diabetes mellitus with foot ulcer: Secondary | ICD-10-CM | POA: Diagnosis not present

## 2022-08-05 LAB — GLUCOSE, CAPILLARY
Glucose-Capillary: 261 mg/dL — ABNORMAL HIGH (ref 70–99)
Glucose-Capillary: 92 mg/dL (ref 70–99)

## 2022-08-05 NOTE — Progress Notes (Signed)
FOREST, PRUDEN ([id number]) [id number] Page 1 of 2 Visit Report for 08/05/2022 HBO Details Patient Name: Date of Service: [name] [name], [name] [name] 08/05/2022 8:00 A M Medical Record Number: [id number] Patient Account Number: [id number] Date of Birth/Sex: Treating RN: August 19, 1965 (57 y.o. [name] Primary Care Dacota Ruben: [name] Other Clinician: Karl Bales Referring Arsh Feutz: Treating Daissy Yerian/Extender: [name] GESH Weeks in Treatment: 3 HBO Treatment Course Details Treatment Course Number: 2 Ordering Josiane Labine: [name] T Treatments Ordered: otal 40 HBO Treatment Start Date: 07/18/2022 HBO Indication: Diabetic Ulcer(s) of the Lower Extremity Standard/Conservative Wound Care tried and failed greater than or equal to 30 days Wound #4 Right Calcaneus HBO Treatment Details Treatment Number: 12 Patient Type: Outpatient Chamber Type: Monoplace Chamber Serial #: [id number] Treatment Protocol: 2.0 ATA with 90 minutes oxygen, with two 5 minute air breaks Treatment Details Compression Rate Down: 1.5 psi / minute De-Compression Rate Up: 2.0 psi / minute A breaks and breathing ir Compress Tx Pressure periods Decompress Decompress Begins Reached (leave unused spaces Begins Ends blank) Chamber Pressure (ATA 1 2 2 2 2 2  --2 1 ) Clock Time (24 hr) 08:09 08:19 08:49 08:54 09:25 09:30 - - 10:00 10:07 Treatment Length: 118 (minutes) Treatment Segments: 4 Vital Signs Capillary Blood Glucose Reference Range: 80 - 120 mg / dl HBO Diabetic Blood Glucose Intervention Range: <131 mg/dl or >782 mg/dl Type: Time Vitals Blood Pulse: Respiratory Temperature: Capillary Blood Glucose Pulse Action Taken: Pressure: Rate: Glucose (mg/dl): Meter #: Oximetry (%) Taken: Pre 07:45 261 Post 10:12 137/82 71 20 97.3 92 Patient given 8ox Glucerna and waited Treatment Response Treatment Toleration: Well Treatment Completion Status: Treatment  Completed without Adverse Event Additional Procedure Documentation Tissue Sevierity: Necrosis of bone Jackee Glasner Notes No concerns with treatment given Physician HBO Attestation: I certify that I supervised this HBO treatment in accordance with Medicare guidelines. A trained emergency response team is readily available per Yes hospital policies and procedures. Continue HBOT as ordered. Yes Electronic Signature(s) Signed: 08/05/2022 4:34:22 PM By: Baltazar Najjar MD Previous Signature: 08/05/2022 1:19:10 PM Version By: Karl Bales EMT Previous Signature: 08/05/2022 11:04:11 AM Version By: Karl Bales EMT Previous Signature: 08/05/2022 8:46:53 AM Version By: Karl Bales EMT Entered By: Baltazar Najjar on 08/05/2022 16:33:17 [name] ([id number]) [id number] Page 2 of 2 -------------------------------------------------------------------------------- HBO Safety Checklist Details Patient Name: Date of Service: [name] [name], [name] [name] 08/05/2022 8:00 A M Medical Record Number: [id number] Patient Account Number: [id number] Date of Birth/Sex: Treating RN: August 28, 1965 (57 y.o. [name] Primary Care Stefan Karen: [name] Other Clinician: Karl Bales Referring Chevette Fee: Treating Irania Durell/Extender: Deberah Pelton, [name]: 3 HBO Safety Checklist Items Safety Checklist Consent Form Signed Patient voided / foley secured and emptied When did you last eato 0700 Last dose of injectable or oral agent 0700 Ostomy pouch emptied and vented if applicable NA All implantable devices assessed, documented and approved NA Intravenous access site secured and place NA Valuables secured Linens and cotton and cotton/polyester blend (less than 51% polyester) Personal oil-based products / skin lotions / body lotions removed Wigs or hairpieces removed Human Extensions approved Smoking or tobacco materials removed Books / newspapers /  magazines / loose paper removed Cologne, aftershave, perfume and deodorant removed Jewelry removed (may wrap wedding band) Make-up removed Hair care products removed Battery operated devices (external) removed Libre3 Heating patches and chemical warmers removed Titanium eyewear removed NA Nail polish cured greater than 10 hours over 2  weeks old Casting material cured greater than 10 hours NA Hearing aids removed NA Loose dentures or partials removed NA Prosthetics have been removed NA Patient demonstrates correct use of air break device (if applicable) Patient concerns have been addressed Patient grounding bracelet on and cord attached to chamber Specifics for Inpatients (complete in addition to above) Medication sheet sent with patient NA Intravenous medications needed or due during therapy sent with patient NA Drainage tubes (e.g. nasogastric tube or chest tube secured and vented) NA Endotracheal or Tracheotomy tube secured NA Cuff deflated of air and inflated with saline NA Airway suctioned NA Notes The safety checklist was done before the treatment started. Electronic Signature(s) Signed: 08/05/2022 1:18:58 PM By: Karl Bales EMT Previous Signature: 08/05/2022 8:43:06 AM Version By: Karl Bales EMT Entered By: Karl Bales on 08/05/2022 13:18:58

## 2022-08-06 NOTE — Progress Notes (Signed)
KAYLEN, MOTL ([id number]) [id number] Page 1 of 2 Visit Report for 08/05/2022 Arrival Information Details Patient Name: Date of Service: [name] [name], [name] [name] 08/05/2022 8:00 A M Medical Record Number: [id number] Patient Account Number: [id number] Date of Birth/Sex: Treating RN: 09-07-1965 (57 y.o. [name] Primary Care Dhriti Fales: [name] Other Clinician: Karl Bales Referring Broc Caspers: Treating Iverna Hammac/Extender: [name] GESH Weeks in Treatment: 3 Visit Information History Since Last Visit All ordered tests and consults were completed: Yes Patient Arrived: Wheel Chair Added or deleted any medications: No Arrival Time: 07:35 Any new allergies or adverse reactions: No Accompanied By: Husband Had a fall or experienced change in No Transfer Assistance: None activities of daily living that may affect Patient Identification Verified: Yes risk of falls: Secondary Verification Process Completed: Yes Signs or symptoms of abuse/neglect since last visito No Patient Requires Transmission-Based Precautions: No Hospitalized since last visit: No Patient Has Alerts: No Implantable device outside of the clinic excluding No cellular tissue based products placed in the center since last visit: Pain Present Now: No Electronic Signature(s) Signed: 08/05/2022 1:18:33 PM By: Karl Bales EMT Entered By: Karl Bales on 08/05/2022 13:18:33 -------------------------------------------------------------------------------- Encounter Discharge Information Details Patient Name: Date of Service: [name] Milo D. 08/05/2022 8:00 A M Medical Record Number: [id number] Patient Account Number: [id number] Date of Birth/Sex: Treating RN: 27-May-1965 (57 y.o. [name] Primary Care Kodiak Rollyson: [name] Other Clinician: Karl Bales Referring Leasha Goldberger: Treating Verdelle Valtierra/Extender: [name] GESH Weeks in  Treatment: 3 Encounter Discharge Information Items Discharge Condition: Stable Ambulatory Status: Wheelchair Discharge Destination: Home Transportation: Private Auto Accompanied By: Husband Schedule Follow-up Appointment: Yes Clinical Summary of Care: Electronic Signature(s) Signed: 08/05/2022 1:19:52 PM By: Karl Bales EMT Previous Signature: 08/05/2022 11:05:57 AM Version By: Karl Bales EMT Entered By: Karl Bales on 08/05/2022 13:19:52 -------------------------------------------------------------------------------- Vitals Details Patient Name: Date of Service: [name] Milo D. 08/05/2022 8:00 A M Medical Record Number: [id number] Patient Account Number: [id number] Date of Birth/Sex: Treating RN: 1965-10-02 (57 y.o. [name] Primary Care Ursala Cressy: [name] Other Clinician: Karl Bales Referring Micala Saltsman: Treating Annalucia Laino/Extender: Deberah Pelton, [name]: 3 Vital Signs Time Taken: 07:45 Capillary Blood Glucose (mg/dl): 629 [name] [name], [name] [name] ([id number]) [phone].pdf Page 2 of 2 Height (in): 65 Reference Range: 80 - 120 mg / dl Weight (lbs): 295 Body Mass Index (BMI): 17.5 Electronic Signature(s) Signed: 08/05/2022 1:18:43 PM By: Karl Bales EMT Previous Signature: 08/05/2022 8:39:10 AM Version By: Karl Bales EMT Entered By: Karl Bales on 08/05/2022 13:18:43

## 2022-08-06 NOTE — Progress Notes (Signed)
ROSELENE, GRAY ([id number]) [id number] Page 1 of 2 Visit Report for 08/05/2022 Problem List Details Patient Name: Date of Service: DAPHNE, KARRER 08/05/2022 8:00 A M Medical Record Number: [id number] Patient Account Number: [id number] Date of Birth/Sex: Treating RN: Aug 31, 1965 (57 y.o. 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 Other chronic osteomyelitis, right ankle and foot E10.621 Type 1 diabetes mellitus with foot ulcer Facility Procedures Physician Procedures : CPT4 Code Description Modifier [id number] [phone] - WC PHYS HYPERBARIC OXYGEN THERAPY ICD-10 Diagnosis  Description E10.621 Type 1 diabetes mellitus with foot ulcer 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

## 2022-08-08 ENCOUNTER — Encounter (HOSPITAL_BASED_OUTPATIENT_CLINIC_OR_DEPARTMENT_OTHER): Payer: BC Managed Care – PPO | Admitting: Internal Medicine

## 2022-08-08 DIAGNOSIS — M86671 Other chronic osteomyelitis, right ankle and foot: Secondary | ICD-10-CM

## 2022-08-08 DIAGNOSIS — E10621 Type 1 diabetes mellitus with foot ulcer: Secondary | ICD-10-CM

## 2022-08-08 DIAGNOSIS — L97514 Non-pressure chronic ulcer of other part of right foot with necrosis of bone: Secondary | ICD-10-CM

## 2022-08-08 LAB — GLUCOSE, CAPILLARY
Glucose-Capillary: 221 mg/dL — ABNORMAL HIGH (ref 70–99)
Glucose-Capillary: 158 mg/dL — ABNORMAL HIGH (ref 70–99)

## 2022-08-08 NOTE — Progress Notes (Signed)
[name], [name] ([id number]) [id number] Page 1 of 2 Visit Report for 08/08/2022 HBO Details Patient Name: Date of Service: [name] [name], [name] [name] 08/08/2022 8:00 A M Medical Record Number: [id number] Patient Account Number: [id number] Date of Birth/Sex: Treating RN: 06-15-1965 (57 y.o. [name] [name], [name] [name] Primary Care [name] [name]: [name] [name] Other Clinician: Karl [name] Referring [name] [name]: Treating [name] [name]/Extender: [name] [name] [name] Weeks in Treatment: 3 HBO Treatment Course Details Treatment Course Number: 2 Ordering [name] [name]: [name] [name] T Treatments Ordered: otal 40 HBO Treatment Start Date: 07/18/2022 HBO Indication: Diabetic Ulcer(s) of the Lower Extremity Standard/Conservative Wound Care tried and failed greater than or equal to 30 days Wound #4 Right Calcaneus HBO Treatment Details Treatment Number: 13 Patient Type: Outpatient Chamber Type: Monoplace Chamber Serial #: [id number] Treatment Protocol: 2.0 ATA with 90 minutes oxygen, with two 5 minute air breaks Treatment Details Compression Rate Down: 1.5 psi / minute De-Compression Rate Up: 2.0 psi / minute A breaks and breathing ir Compress Tx Pressure periods Decompress Decompress Begins Reached (leave unused spaces Begins Ends blank) Chamber Pressure (ATA 1 2 2 2 2 2  --2 1 ) Clock Time (24 hr) 08:07 08:17 08:47 08:52 09:22 09:27 - - 09:57 10:04 Treatment Length: 117 (minutes) Treatment Segments: 4 Vital Signs Capillary Blood Glucose Reference Range: 80 - 120 mg / dl HBO Diabetic Blood Glucose Intervention Range: <131 mg/dl or >324 mg/dl Time Vitals Blood Respiratory Capillary Blood Glucose Pulse Action Type: Pulse: Temperature: Taken: Pressure: Rate: Glucose (mg/dl): Meter #: Oximetry (%) Taken: Pre 07:46 137/76 73 18 97.3 221 Post 10:05 156/87 72 16 98.2 158 Treatment Response Treatment Toleration: Well Treatment Completion Status: Treatment Completed without  Adverse Event Additional Procedure Documentation Tissue Sevierity: Necrosis of bone Physician HBO Attestation: I certify that I supervised this HBO treatment in accordance with Medicare guidelines. A trained emergency response team is readily available per Yes hospital policies and procedures. Continue HBOT as ordered. Yes Electronic Signature(s) Signed: 08/09/2022 3:34:20 PM By: [name] Corwin DO Previous Signature: 08/08/2022 3:40:27 PM Version By: [name] [name] EMT Previous Signature: 08/08/2022 4:48:17 PM Version By: [name] Corwin DO Previous Signature: 08/08/2022 3:39:49 PM Version By: [name] [name] EMT Entered By: [name] [name] on 08/09/2022 12:17:26 -------------------------------------------------------------------------------- HBO Safety Checklist Details Patient Name: Date of Service: [name] [name] D. 08/08/2022 8:00 A M Medical Record Number: [id number] Patient Account Number: [id number] [name] [name], [name] [name] ([id number]) [phone].pdf Page 2 of 2 Date of Birth/Sex: Treating RN: [birth year] (57 y.o. [name] [name], [name] [name] Primary Care [name] [name]: Other Clinician: Juanita [name] Referring [name] [name]: Treating [name] [name]/Extender: [name] [name], [name] [name] Weeks in Treatment: 3 HBO Safety Checklist Items Safety Checklist Consent Form Signed Patient voided / foley secured and emptied When did you last eato 0700 Last dose of injectable or oral agent 0700 Ostomy pouch emptied and vented if applicable NA All implantable devices assessed, documented and approved NA Intravenous access site secured and place NA Valuables secured Linens and cotton and cotton/polyester blend (less than 51% polyester) Personal oil-based products / skin lotions / body lotions removed Wigs or hairpieces removed Human Extensions approved Smoking or tobacco materials removed Books / newspapers / magazines / loose paper removed Cologne, aftershave, perfume and  deodorant removed Jewelry removed (may wrap wedding band) Make-up removed Hair care products removed Battery operated devices (external) removed Libre3 Heating patches and chemical warmers removed Titanium eyewear removed NA Nail polish cured greater than 10 hours 3 weeks ago + Casting material cured greater than 10 hours NA  Hearing aids removed NA Loose dentures or partials removed NA Prosthetics have been removed NA Patient demonstrates correct use of air break device (if applicable) Patient concerns have been addressed Patient grounding bracelet on and cord attached to chamber Specifics for Inpatients (complete in addition to above) Medication sheet sent with patient NA Intravenous medications needed or due during therapy sent with patient NA Drainage tubes (e.g. nasogastric tube or chest tube secured and vented) NA Endotracheal or Tracheotomy tube secured NA Cuff deflated of air and inflated with saline NA Airway suctioned NA Notes The safety checklist was done before the treatment started. Electronic Signature(s) Signed: 08/08/2022 3:29:55 PM By: [name] [name] EMT Entered By: [name] [name] on 08/08/2022 15:29:54

## 2022-08-09 ENCOUNTER — Encounter (HOSPITAL_BASED_OUTPATIENT_CLINIC_OR_DEPARTMENT_OTHER): Payer: BC Managed Care – PPO | Admitting: Internal Medicine

## 2022-08-09 DIAGNOSIS — E10621 Type 1 diabetes mellitus with foot ulcer: Secondary | ICD-10-CM | POA: Diagnosis not present

## 2022-08-09 DIAGNOSIS — M86671 Other chronic osteomyelitis, right ankle and foot: Secondary | ICD-10-CM

## 2022-08-09 DIAGNOSIS — L97514 Non-pressure chronic ulcer of other part of right foot with necrosis of bone: Secondary | ICD-10-CM

## 2022-08-09 LAB — GLUCOSE, CAPILLARY
Glucose-Capillary: 271 mg/dL — ABNORMAL HIGH (ref 70–99)
Glucose-Capillary: 200 mg/dL — ABNORMAL HIGH (ref 70–99)

## 2022-08-09 NOTE — Progress Notes (Signed)
[name] [name] ([id number]) [id number] Page 1 of 2 Visit Report for 08/09/2022 HBO Details Patient Name: Date of Service: [name] [name], [name] [name] 08/09/2022 10:00 A M Medical Record Number: [id number] Patient Account Number: [id number] Date of Birth/Sex: Treating RN: November 13, 1965 (57 y.o. [name] [name] Primary Care Davaughn Hillyard: [name] Other Clinician: Haywood Pao Referring Angelika Jerrett: Treating Berania Peedin/Extender: [name], [name]: 4 HBO Treatment Course Details Treatment Course Number: 2 Ordering Nettie Wyffels: [name] T Treatments Ordered: otal 40 HBO Treatment Start Date: 07/18/2022 HBO Indication: Diabetic Ulcer(s) of the Lower Extremity Standard/Conservative Wound Care tried and failed greater than or equal to 30 days Wound #4 Right Calcaneus HBO Treatment Details Treatment Number: 14 Patient Type: Outpatient Chamber Type: Monoplace Chamber Serial #: [id number] Treatment Protocol: 2.0 ATA with 90 minutes oxygen, with two 5 minute air breaks Treatment Details Compression Rate Down: 1.5 psi / minute De-Compression Rate Up: 2.0 psi / minute A breaks and breathing ir Compress Tx Pressure periods Decompress Decompress Begins Reached (leave unused spaces Begins Ends blank) Chamber Pressure (ATA 1 2 2 2 2 2  --2 1 ) Clock Time (24 hr) 10:00 10:11 10:41 10:46 11:17 11:22 - - 11:51 12:00 Treatment Length: 120 (minutes) Treatment Segments: 4 Vital Signs Capillary Blood Glucose Reference Range: 80 - 120 mg / dl HBO Diabetic Blood Glucose Intervention Range: <131 mg/dl or >403 mg/dl Type: Time Vitals Blood Pulse: Respiratory Temperature: Capillary Blood Glucose Pulse Action Taken: Pressure: Rate: Glucose (mg/dl): Meter #: Oximetry (%) Taken: Pre 09:38 131/76 79 18 98.4 271 1 asymptomatic for hyperglycemia Post 12:07 139/98 75 18 98.2 200 1 none per protocol Treatment Response Treatment Toleration:  Well Treatment Completion Status: Treatment Completed without Adverse Event Treatment Notes [name] [name] arrived with blood glucose of 271 mg/dL. Patient was asymptomatic for hyperglycemia. She stated that she self-administered insulin at 0700 after eating Toast with peanut butter and fruit. She prepared for treatment. After performing a safety check, she was placed in the chamber which was compressed with 100% oxygen at a rate of 2 psi/min after confirming normal ear equalization. She tolerated treatment and subsequent decompression at a rate set of 2 psi/min. Her post-treatment vital signs were within normal range. She was stable upon discharge with driver taking her home. Additional Procedure Documentation Tissue Sevierity: Necrosis of bone Physician HBO Attestation: I certify that I supervised this HBO treatment in accordance with Medicare guidelines. A trained emergency response team is readily available per Yes hospital policies and procedures. Continue HBOT as ordered. Yes Electronic Signature(s) Signed: 08/10/2022 2:12:00 PM By: [name] DO Previous Signature: 08/09/2022 12:46:33 PM Version By: Haywood Pao CHT EMT BS , , Previous Signature: 08/09/2022 12:28:22 PM Version By: Haywood Pao CHT EMT BS , , Entered By: [name] on 08/10/2022 13:54:21 [name] [name] ([id number]) [id number] Page 2 of 2 -------------------------------------------------------------------------------- HBO Safety Checklist Details Patient Name: Date of Service: [name] [name], [name] [name] 08/09/2022 10:00 A M Medical Record Number: [id number] Patient Account Number: [id number] Date of Birth/Sex: Treating RN: 24-Mar-1965 (57 y.o. [name] [name] Primary Care Suhey Radford: [name] Other Clinician: Haywood Pao Referring Kimsey Demaree: Treating Haim Hansson/Extender: [name], [name]: 4 HBO Safety Checklist Items Safety  Checklist Consent Form Signed Patient voided / foley secured and emptied When did you last eato 0700 Toast, PB, Fruit Last dose of injectable or oral agent 0700 Ostomy pouch emptied and vented if applicable NA All implantable devices assessed, documented and approved  NA Intravenous access site secured and place NA Valuables secured Linens and cotton and cotton/polyester blend (less than 51% polyester) Personal oil-based products / skin lotions / body lotions removed Wigs or hairpieces removed human hair extensions approved. Smoking or tobacco materials removed NA Books / newspapers / magazines / loose paper removed Cologne, aftershave, perfume and deodorant removed Jewelry removed (may wrap wedding band) Make-up removed Hair care products removed Human Hair extensions approved Libre 3 Approved, otherwise all electronics Battery operated devices (external) removed removed. Heating patches and chemical warmers removed Titanium eyewear removed Nail polish cured greater than 10 hours greater than 10 hours Casting material cured greater than 10 hours NA Hearing aids removed NA Loose dentures or partials removed NA Prosthetics have been removed NA Patient demonstrates correct use of air break device (if applicable) Patient concerns have been addressed Patient grounding bracelet on and cord attached to chamber Specifics for Inpatients (complete in addition to above) Medication sheet sent with patient NA Intravenous medications needed or due during therapy sent with patient NA Drainage tubes (e.g. nasogastric tube or chest tube secured and vented) NA Endotracheal or Tracheotomy tube secured NA Cuff deflated of air and inflated with saline NA Airway suctioned NA Notes Paper version used prior to treatment start. Electronic Signature(s) Signed: 08/09/2022 12:00:55 PM By: Haywood Pao CHT EMT BS , , Previous Signature: 08/09/2022 11:59:41 AM Version By: Haywood Pao CHT EMT BS , , Entered By: Haywood Pao on 08/09/2022 12:00:54

## 2022-08-09 NOTE — Progress Notes (Signed)
ERIELLE, GAWRONSKI ([id number]) [id number] Page 1 of 2 Visit Report for 08/09/2022 Arrival Information Details Patient Name: Date of Service: [name] [name], [name] [name] 08/09/2022 10:00 A M Medical Record Number: [id number] Patient Account Number: [id number] Date of Birth/Sex: Treating RN: Dec 30, 1965 (57 y.o. [name] Primary Care Marleah Beever: [name] Other Clinician: Haywood Pao Referring Taitum Menton: Treating Tiney Zipper/Extender: [name], [name]: 4 Visit Information History Since Last Visit All ordered tests and consults were completed: Yes Patient Arrived: Wheel Chair Added or deleted any medications: No Arrival Time: 09:33 Any new allergies or adverse reactions: No Accompanied By: driver Had a fall or experienced change in No Transfer Assistance: None activities of daily living that may affect Patient Identification Verified: Yes risk of falls: Secondary Verification Process Completed: Yes Signs or symptoms of abuse/neglect since last visito No Patient Requires Transmission-Based Precautions: No Hospitalized since last visit: No Patient Has Alerts: No Implantable device outside of the clinic excluding No cellular tissue based products placed in the center since last visit: Pain Present Now: No Electronic Signature(s) Signed: 08/09/2022 11:55:27 AM By: Haywood Pao CHT EMT BS , , Entered By: Haywood Pao on 08/09/2022 11:55:27 -------------------------------------------------------------------------------- Encounter Discharge Information Details Patient Name: Date of Service: [name] Milo [name]. 08/09/2022 10:00 A M Medical Record Number: [id number] Patient Account Number: [id number] Date of Birth/Sex: Treating RN: 18-Jul-1965 (57 y.o. [name] Primary Care Denae Zulueta: [name] Other Clinician: Haywood Pao Referring Breyah Akhter: Treating Cloria Ciresi/Extender: [name], [name]: 4 Encounter Discharge Information Items Discharge Condition: Stable Ambulatory Status: Wheelchair Discharge Destination: Home Transportation: Private Auto Accompanied By: driver Schedule Follow-up Appointment: No Clinical Summary of Care: Electronic Signature(s) Signed: 08/09/2022 12:57:00 PM By: Haywood Pao CHT EMT BS , , Entered By: Haywood Pao on 08/09/2022 12:57:00 -------------------------------------------------------------------------------- Vitals Details Patient Name: Date of Service: [name] Milo [name]. 08/09/2022 10:00 A M Medical Record Number: [id number] Patient Account Number: [id number] Date of Birth/Sex: Treating RN: [birth year] (57 y.o. [name] Primary Care Luretha Eberly: [name] Other Clinician: Haywood Pao Referring Donielle Kaigler: Treating Ayrton Mcvay/Extender: [name], [name]: 4 Vital Signs Time Taken: 09:38 Temperature (F): 98.4 [name] [name], [name] [name] ([id number]) [id number] Page 2 of 2 Height (in): 65 Pulse (bpm): 79 Weight (lbs): 105 Respiratory Rate (breaths/min): 18 Body Mass Index (BMI): 17.5 Blood Pressure (mmHg): 131/76 Capillary Blood Glucose (mg/dl): 366 Reference Range: 80 - 120 mg / dl Electronic Signature(s) Signed: 08/09/2022 11:55:57 AM By: Haywood Pao CHT EMT BS , , Entered By: Haywood Pao on 08/09/2022 11:55:57

## 2022-08-09 NOTE — Progress Notes (Signed)
GWENNA, FUSTON ([id number]) [id number] Page 1 of 2 Visit Report for 08/08/2022 Problem List Details Patient Name: Date of Service: SOLYMAR, GRACE 08/08/2022 8:00 A M Medical Record Number: [id number] Patient Account Number: [id number] Date of Birth/Sex: Treating RN: [birth year] (57 y.o. 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, right ankle and foot E10.621 Type 1 diabetes mellitus with foot ulcer Facility Procedures Physician Procedures : CPT4 Code Description Modifier [id number] [phone] - WC PHYS HYPERBARIC OXYGEN THERAPY ICD-10 Diagnosis  Description E10.621 Type 1 diabetes mellitus with foot ulcer L97.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

## 2022-08-10 ENCOUNTER — Encounter (HOSPITAL_BASED_OUTPATIENT_CLINIC_OR_DEPARTMENT_OTHER): Payer: BC Managed Care – PPO | Admitting: Physician Assistant

## 2022-08-10 DIAGNOSIS — E10621 Type 1 diabetes mellitus with foot ulcer: Secondary | ICD-10-CM | POA: Diagnosis not present

## 2022-08-10 LAB — GLUCOSE, CAPILLARY
Glucose-Capillary: 174 mg/dL — ABNORMAL HIGH (ref 70–99)
Glucose-Capillary: 210 mg/dL — ABNORMAL HIGH (ref 70–99)
Glucose-Capillary: 177 mg/dL — ABNORMAL HIGH (ref 70–99)
Glucose-Capillary: 258 mg/dL — ABNORMAL HIGH (ref 70–99)

## 2022-08-11 ENCOUNTER — Ambulatory Visit (INDEPENDENT_AMBULATORY_CARE_PROVIDER_SITE_OTHER): Payer: BC Managed Care – PPO

## 2022-08-11 ENCOUNTER — Ambulatory Visit (INDEPENDENT_AMBULATORY_CARE_PROVIDER_SITE_OTHER): Payer: BC Managed Care – PPO | Admitting: Podiatry

## 2022-08-11 ENCOUNTER — Encounter (HOSPITAL_BASED_OUTPATIENT_CLINIC_OR_DEPARTMENT_OTHER): Payer: BC Managed Care – PPO | Admitting: Internal Medicine

## 2022-08-11 DIAGNOSIS — M86671 Other chronic osteomyelitis, right ankle and foot: Secondary | ICD-10-CM | POA: Diagnosis not present

## 2022-08-11 DIAGNOSIS — L97514 Non-pressure chronic ulcer of other part of right foot with necrosis of bone: Secondary | ICD-10-CM | POA: Diagnosis not present

## 2022-08-11 DIAGNOSIS — L97414 Non-pressure chronic ulcer of right heel and midfoot with necrosis of bone: Secondary | ICD-10-CM

## 2022-08-11 DIAGNOSIS — M86071 Acute hematogenous osteomyelitis, right ankle and foot: Secondary | ICD-10-CM

## 2022-08-11 DIAGNOSIS — E10621 Type 1 diabetes mellitus with foot ulcer: Secondary | ICD-10-CM | POA: Diagnosis not present

## 2022-08-11 LAB — GLUCOSE, CAPILLARY
Glucose-Capillary: 290 mg/dL — ABNORMAL HIGH (ref 70–99)
Glucose-Capillary: 175 mg/dL — ABNORMAL HIGH (ref 70–99)

## 2022-08-11 NOTE — Progress Notes (Signed)
[name] [name], [name] [name] ([id number]) [id number] Page 1 of 1 Visit Report for 08/09/2022 SuperBill Details Patient Name: Date of Service: [name] [name], [name] [name] 08/09/2022 Medical Record Number: [id number] Patient Account Number: [id number] Date of Birth/Sex: Treating RN: [birth year] (57 y.o. 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, right ankle and foot E10.621 Type 1 diabetes mellitus with foot ulcer Facility Procedures CPT4 Code Description Modifier Quantity [id number] G0277-(Facility Use Only) HBOT full body chamber, , 4 ICD-10 Diagnosis Description E10.621 Type 1 diabetes mellitus with foot ulcer 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

## 2022-08-11 NOTE — Progress Notes (Signed)
RHONNA, HOLSTER ([id number]) [id number] Page 1 of 1 Visit Report for 08/10/2022 SuperBill Details Patient Name: Date of Service: [name] [name], [name] [name] 08/10/2022 Medical Record Number: [id number] Patient Account Number: [id number] Date of Birth/Sex: Treating RN: 1966-02-14 (57 y.o. 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, right ankle and foot E10.621 Type 1 diabetes mellitus with foot ulcer Facility Procedures CPT4 Code Description Modifier Quantity [id number] G0277-(Facility Use Only) HBOT full body chamber, , 4 ICD-10 Diagnosis Description E10.621 Type 1 diabetes mellitus with foot ulcer 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

## 2022-08-11 NOTE — Progress Notes (Signed)
Subjective: No chief complaint on file.   57 year old female presents the office for above concerns.  She is on antibiotics per infectious disease and she is also been going to get daily hyperbaric oxygen therapy treatment.  She is continue with Dakin's solution to the wound daily.  She sees the wound care MD tomorrow.   Objective: AAO x3, NAD DP/PT pulses palpable bilaterally, CRT less than 3 seconds Large full-thickness ulceration noted to right heel measuring about the same its mostly 100% granular.  It measures 3 x2 cm today.  There is no family coming today. Cellulitis present.  There is no fluctuation, crepitation there is no malodor.  No pain with calf compression, swelling, warmth, erythema  Assessment: 57 year old female with right heel ulceration, osteomyelitis, calcaneal fracture  Plan: -All treatment options discussed with the patient including all alternatives, risks, complications.  -X-rays obtained and reviewed.  3 views of the foot were obtained.  Cortical changes of the posterior calcaneus consistent with osteomyelitis with fracture noted of the posterior calcaneus.  No soft tissue emphysema. Changes appear to be stable compared to last x-ray.  -At this time she is going to wound care center she is also on antibiotics.  She will continue with Dakin's solution for wound care center for now and I will defer to them for further treatment for the wound however if she is eating at all to let me know.  -She is out of antibiotics, but not sure what she is on. She is going to call me when she gets home or send me a MyChart message to refill until she sees ID (or she can call the ID officer for a refill).  -Monitor for any clinical signs or symptoms of infection and directed to call the office immediately should any occur or go to the ER.  [name] DPM   3 x2 Put in for new wheelchair

## 2022-08-11 NOTE — Progress Notes (Signed)
[name], [name] ([id number]) [id number] Page 1 of 2 Visit Report for 08/10/2022 Arrival Information Details Patient Name: Date of Service: [name], [name] 08/10/2022 10:00 A M Medical Record Number: [id number] Patient Account Number: [id number] Date of Birth/Sex: Treating RN: 1965/08/12 (57 y.o. Katrinka Blazing Primary Care Ryosuke Ericksen: [name] Other Clinician: Haywood Pao Referring Matilynn Dacey: Treating Ela Moffat/Extender: Alford Highland, [name]: 4 Visit Information History Since Last Visit All ordered tests and consults were completed: Yes Patient Arrived: Wheel Chair Added or deleted any medications: No Arrival Time: 12:13 Any new allergies or adverse reactions: No Accompanied By: driver Had a fall or experienced change in No Transfer Assistance: None activities of daily living that may affect Patient Identification Verified: Yes risk of falls: Secondary Verification Process Completed: Yes Signs or symptoms of abuse/neglect since last visito No Patient Requires Transmission-Based Precautions: No Hospitalized since last visit: No Patient Has Alerts: No Implantable device outside of the clinic excluding No cellular tissue based products placed in the center since last visit: Pain Present Now: No Electronic Signature(s) Signed: 08/10/2022 12:13:42 PM By: Haywood Pao CHT EMT BS , , Entered By: Haywood Pao on 08/10/2022 12:13:41 -------------------------------------------------------------------------------- Encounter Discharge Information Details Patient Name: Date of Service: [name] Milo [name]. 08/10/2022 10:00 A M Medical Record Number: [id number] Patient Account Number: [id number] Date of Birth/Sex: Treating RN: [birth year] (57 y.o. Katrinka Blazing Primary Care Camden Mazzaferro: [name] Other Clinician: Haywood Pao Referring Sindhu Nguyen: Treating Avangeline Stockburger/Extender: Alford Highland, [name]: 4 Encounter Discharge Information Items Discharge Condition: Stable Ambulatory Status: Wheelchair Discharge Destination: Home Transportation: Other Accompanied By: driver Schedule Follow-up Appointment: No Clinical Summary of Care: Electronic Signature(s) Signed: 08/10/2022 1:45:26 PM By: Haywood Pao CHT EMT BS , , Entered By: Haywood Pao on 08/10/2022 13:45:26 -------------------------------------------------------------------------------- Vitals Details Patient Name: Date of Service: [name] Milo [name]. 08/10/2022 10:00 A M Medical Record Number: [id number] Patient Account Number: [id number] Date of Birth/Sex: Treating RN: 14-Dec-1965 (57 y.o. Katrinka Blazing Primary Care Marciel Offenberger: [name] Other Clinician: Haywood Pao Referring Charie Pinkus: Treating Joseff Luckman/Extender: Alford Highland, [name]: 4 Vital Signs Time Taken: 09:39 Temperature (F): 97.9 [name] [name] ([id number]) [id number] Page 2 of 2 Height (in): 65 Pulse (bpm): 68 Weight (lbs): 105 Respiratory Rate (breaths/min): 18 Body Mass Index (BMI): 17.5 Blood Pressure (mmHg): 96/62 Capillary Blood Glucose (mg/dl): 244 Reference Range: 80 - 120 mg / dl Electronic Signature(s) Signed: 08/10/2022 12:14:36 PM By: Haywood Pao CHT EMT BS , , Entered By: Haywood Pao on 08/10/2022 12:14:36

## 2022-08-11 NOTE — Progress Notes (Addendum)
[name] [name] ([id number]) [id number] Page 1 of 2 Visit Report for 08/10/2022 HBO Details Patient Name: Date of Service: [name] [name], [name] [name] 08/10/2022 10:00 A M Medical Record Number: [id number] Patient Account Number: [id number] Date of Birth/Sex: Treating RN: 1965/07/24 (57 y.o. Katrinka Blazing Primary Care Khalid Lacko: [name] Other Clinician: Haywood Pao Referring Teshaun Olarte: Treating Jazzmin Newbold/Extender: Alford Highland, [name]: 4 HBO Treatment Course Details Treatment Course Number: 2 Ordering Kimari Lienhard: [name] T Treatments Ordered: otal 40 HBO Treatment Start Date: 07/18/2022 HBO Indication: Diabetic Ulcer(s) of the Lower Extremity Standard/Conservative Wound Care tried and failed greater than or equal to 30 days Wound #4 Right Calcaneus HBO Treatment Details Treatment Number: 15 Patient Type: Outpatient Chamber Type: Monoplace Chamber Serial #: [id number] Treatment Protocol: 2.0 ATA with 90 minutes oxygen, with two 5 minute air breaks Treatment Details Compression Rate Down: 1.0 psi / minute De-Compression Rate Up: 2.0 psi / minute A breaks and breathing ir Compress Tx Pressure periods Decompress Decompress Begins Reached (leave unused spaces Begins Ends blank) Chamber Pressure (ATA 1 2 2 2 2 2  --2 1 ) Clock Time (24 hr) 11:04 11:16 11:46 11:51 12:21 12:26 - - 12:56 13:04 Treatment Length: 120 (minutes) Treatment Segments: 4 Vital Signs Capillary Blood Glucose Reference Range: 80 - 120 mg / dl HBO Diabetic Blood Glucose Intervention Range: <131 mg/dl or >782 mg/dl Type: Time Vitals Blood Pulse: Respiratory Temperature: Capillary Blood Glucose Pulse Action Taken: Pressure: Rate: Glucose (mg/dl): Meter #: Oximetry (%) Taken: Pre 09:39 96/62 68 18 97.9 210 1 none per protocol Pre 10:00 174 1 taken at patient request Pre 10:54 177 Post 13:05 143/87 72 18 97.8 258 1 patient asymptomatic for  hyperglycemia Treatment Response Treatment Toleration: Well Treatment Completion Status: Treatment Completed without Adverse Event Treatment Notes [name] arrived with systolic blood pressure at 96 mmHg, otherwise vital signs all normal. She denied symptoms of hypotension. She prepared for treatment. After preparing for treatment she informed me that her blood glucose was on a downward trend according to her Libre 3. Confirmed downward trend with 174 mg/dL at 9562. She stated that she had taken 5 units of humalog and ate toast with peanut butter, fruit, and coffee. Patient was given 8 oz Glucerna, apple juice and ate peanut butter crackers that she brought with her. Patient monitored blood glucose with Libre 3 decreasing to 160, 134, and 130 mg/dL 130 mg/dL on her Libre 3 at 8657 indicating an upward trend. At 1054 the upward trend was confirmed with 177 mg/dL with hospital glucometer. After performing a safety check, patient was placed in the chamber which was compressed with 100% oxygen at a rate of 2 psi/min after confirming normal ear equalization. She tolerated the treatment and decompression of the chamber at a rate of 2 psi/min. She denied any issues with ear equalization and/or pain associated with barotrauma. Her blood glucose level was 258 mg/dL, post treatment. She denied any symptoms of hyperglycemia. She was stable upon discharge with driver. Additional Procedure Documentation Tissue Sevierity: Necrosis of bone Electronic Signature(s) Signed: 08/10/2022 1:44:00 PM By: Haywood Pao CHT EMT BS , , Signed: 08/10/2022 4:47:25 PM By: Allen Derry PA-C Previous Signature: 08/10/2022 1:39:14 PM Version By: Haywood Pao CHT EMT BS , , Previous Signature: 08/10/2022 12:33:23 PM Version By: Haywood Pao CHT EMT BS , , Entered By: Haywood Pao on 08/10/2022 13:44:00 [name] ([id number]) [id number] Page 2 of  2 -------------------------------------------------------------------------------- HBO Safety Checklist Details Patient  Name: Date of Service: [name], [name] 08/10/2022 10:00 A M Medical Record Number: [id number] Patient Account Number: [id number] Date of Birth/Sex: Treating RN: 11-15-1965 (57 y.o. Katrinka Blazing Primary Care Phu Record: [name] Other Clinician: Haywood Pao Referring Ankith Edmonston: Treating Lucillie Kiesel/Extender: Alford Highland, [name]: 4 HBO Safety Checklist Items Safety Checklist Consent Form Signed Patient voided / foley secured and emptied When did you last eato 0700 Last dose of injectable or oral agent 0700 5 units humalog Ostomy pouch emptied and vented if applicable NA All implantable devices assessed, documented and approved NA Intravenous access site secured and place NA Valuables secured Linens and cotton and cotton/polyester blend (less than 51% polyester) Personal oil-based products / skin lotions / body lotions removed Wigs or hairpieces removed NA human hair extensions approved. Smoking or tobacco materials removed NA Books / newspapers / magazines / loose paper removed Cologne, aftershave, perfume and deodorant removed Jewelry removed (may wrap wedding band) Make-up removed Hair care products removed Human Hair extensions approved Libre 3 Approved, otherwise all electronics Battery operated devices (external) removed removed. Heating patches and chemical warmers removed Titanium eyewear removed NA Nail polish cured greater than 10 hours NA Casting material cured greater than 10 hours NA Hearing aids removed NA Loose dentures or partials removed NA Prosthetics have been removed NA Patient demonstrates correct use of air break device (if applicable) Patient concerns have been addressed Patient grounding bracelet on and cord attached to chamber Specifics for Inpatients (complete in addition to  above) Medication sheet sent with patient NA Intravenous medications needed or due during therapy sent with patient NA Drainage tubes (e.g. nasogastric tube or chest tube secured and vented) NA Endotracheal or Tracheotomy tube secured NA Cuff deflated of air and inflated with saline NA Airway suctioned NA Notes Paper version used prior to treatment start. Electronic Signature(s) Signed: 08/10/2022 12:17:02 PM By: Haywood Pao CHT EMT BS , , Entered By: Haywood Pao on 08/10/2022 12:17:02

## 2022-08-12 ENCOUNTER — Encounter (HOSPITAL_BASED_OUTPATIENT_CLINIC_OR_DEPARTMENT_OTHER): Payer: BC Managed Care – PPO | Admitting: Internal Medicine

## 2022-08-12 DIAGNOSIS — L97514 Non-pressure chronic ulcer of other part of right foot with necrosis of bone: Secondary | ICD-10-CM

## 2022-08-12 DIAGNOSIS — M86671 Other chronic osteomyelitis, right ankle and foot: Secondary | ICD-10-CM

## 2022-08-12 DIAGNOSIS — E10621 Type 1 diabetes mellitus with foot ulcer: Secondary | ICD-10-CM

## 2022-08-12 LAB — GLUCOSE, CAPILLARY
Glucose-Capillary: 244 mg/dL — ABNORMAL HIGH (ref 70–99)
Glucose-Capillary: 93 mg/dL (ref 70–99)

## 2022-08-13 NOTE — Progress Notes (Signed)
DEMETA, LIGHTBODY ([id number]) [id number] Page 1 of 2 Visit Report for 08/12/2022 Arrival Information Details Patient Name: Date of Service: [name] [name], [name] [name] 08/12/2022 8:00 A M Medical Record Number: [id number] Patient Account Number: [id number] Date of Birth/Sex: Treating RN: Dec 03, 1965 (57 y.o. [name] Primary Care Gentry Seeber: [name] Other Clinician: Karl Bales Referring Naila Elizondo: Treating Tylek Boney/Extender: [name], [name]: 4 Visit Information History Since Last Visit All ordered tests and consults were completed: Yes Patient Arrived: Wheel Chair Added or deleted any medications: No Arrival Time: 07:34 Any new allergies or adverse reactions: No Accompanied By: Husband Had a fall or experienced change in No Transfer Assistance: None activities of daily living that may affect Patient Identification Verified: Yes risk of falls: Secondary Verification Process Completed: Yes Signs or symptoms of abuse/neglect since last visito No Patient Requires Transmission-Based Precautions: No Hospitalized since last visit: No Patient Has Alerts: No Implantable device outside of the clinic excluding No cellular tissue based products placed in the center since last visit: Pain Present Now: No Electronic Signature(s) Signed: 08/12/2022 11:47:31 AM By: Karl Bales EMT Entered By: Karl Bales on 08/12/2022 11:47:31 -------------------------------------------------------------------------------- Encounter Discharge Information Details Patient Name: Date of Service: [name] [name]. 08/12/2022 8:00 A M Medical Record Number: [id number] Patient Account Number: [id number] Date of Birth/Sex: Treating RN: 1965-10-24 (57 y.o. [name] Primary Care Waylon Koffler: [name] Other Clinician: Karl Bales Referring Gordana Kewley: Treating Tiani Stanbery/Extender: Caleen Essex GESH Weeks in  Treatment: 4 Encounter Discharge Information Items Discharge Condition: Stable Ambulatory Status: Wheelchair Discharge Destination: Other (Note Required) Transportation: Private Auto Schedule Follow-up Appointment: Yes Clinical Summary of Care: Notes The patient was seen in the [facility] after her treatment. Electronic Signature(s) Signed: 08/12/2022 11:57:04 AM By: Karl Bales EMT Entered By: Karl Bales on 08/12/2022 11:57:04 [name] [name] ([id number]) [id number] Page 2 of 2 -------------------------------------------------------------------------------- Vitals Details Patient Name: Date of Service: [name] [name], [name] [name] 08/12/2022 8:00 A M Medical Record Number: [id number] Patient Account Number: [id number] Date of Birth/Sex: Treating RN: Oct 28, 1965 (57 y.o. [name] Primary Care Dalaya Suppa: [name] Other Clinician: Karl Bales Referring Nhu Glasby: Treating Jwan Hornbaker/Extender: [name], [name]: 4 Vital Signs Time Taken: 07:41 Temperature (F): 97.2 Height (in): 65 Pulse (bpm): 77 Weight (lbs): 105 Respiratory Rate (breaths/min): 18 Body Mass Index (BMI): 17.5 Blood Pressure (mmHg): 132/96 Capillary Blood Glucose (mg/dl): 536 Reference Range: 80 - 120 mg / dl Electronic Signature(s) Signed: 08/12/2022 11:48:56 AM By: Karl Bales EMT Entered By: Karl Bales on 08/12/2022 11:48:56

## 2022-08-13 NOTE — Progress Notes (Signed)
[name] [name] ([id number]) [id number] Page 1 of 9 Visit Report for 08/12/2022 Arrival Information Details Patient Name: Date of Service: [name] [name], [name] [name] 08/12/2022 10:30 A M Medical Record Number: [id number] Patient Account Number: [id number] Date of Birth/Sex: Treating RN: 1965-08-10 (57 y.o. [name] [name] Primary Care Shada Nienaber: [name] Other Clinician: Referring Debralee Braaksma: Treating Albertus Chiarelli/Extender: [name], [name]: 4 Visit Information History Since Last Visit Added or deleted any medications: No Patient Arrived: Wheel Chair Any new allergies or adverse reactions: No Arrival Time: 10:37 Had a fall or experienced change in No Accompanied By: self activities of daily living that may affect Transfer Assistance: None risk of falls: Patient Identification Verified: Yes Signs or symptoms of abuse/neglect since last visito No Secondary Verification Process Completed: Yes Hospitalized since last visit: No Patient Requires Transmission-Based Precautions: No Implantable device outside of the clinic excluding No Patient Has Alerts: No cellular tissue based products placed in the center since last visit: Has Dressing in Place as Prescribed: Yes Pain Present Now: No Electronic Signature(s) Signed: 08/12/2022 12:15:24 PM By: Redmond Pulling RN, BSN Entered By: Redmond Pulling on 08/12/2022 10:37:36 -------------------------------------------------------------------------------- Clinic Level of Care Assessment Details Patient Name: Date of Service: [name] [name], [name] [name] 08/12/2022 10:30 A M Medical Record Number: [id number] Patient Account Number: [id number] Date of Birth/Sex: Treating RN: [birth year] (57 y.o. [name] [name] Primary Care Brityn Mastrogiovanni: [name] Other Clinician: Referring Karely Hurtado: Treating Marnee Sherrard/Extender: [name], [name]: 4 Clinic Level of Care Assessment  Items TOOL 4 Quantity Score X- 1 0 Use when only an EandM is performed on FOLLOW-UP visit ASSESSMENTS - Nursing Assessment / Reassessment X- 1 10 Reassessment of Co-morbidities (includes updates in patient status) X- 1 5 Reassessment of Adherence to Treatment Plan ASSESSMENTS - Wound and Skin A ssessment / Reassessment X - Simple Wound Assessment / Reassessment - one wound 1 5 []  - 0 Complex Wound Assessment / Reassessment - multiple wounds []  - 0 Dermatologic / Skin Assessment (not related to wound area) ASSESSMENTS - Focused Assessment X- 1 5 Circumferential Edema Measurements - multi extremities []  - 0 Nutritional Assessment / Counseling / Intervention [name] [name] ([id number]) [id number] Page 2 of 9 []  - 0 Lower Extremity Assessment (monofilament, tuning fork, pulses) []  - 0 Peripheral Arterial Disease Assessment (using hand held doppler) ASSESSMENTS - Ostomy and/or Continence Assessment and Care []  - 0 Incontinence Assessment and Management []  - 0 Ostomy Care Assessment and Management (repouching, etc.) PROCESS - Coordination of Care X - Simple Patient / Family Education for ongoing care 1 15 []  - 0 Complex (extensive) Patient / Family Education for ongoing care X- 1 10 Staff obtains Chiropractor, Records, T Results / Process Orders est []  - 0 Staff telephones HHA, Nursing Homes / Clarify orders / etc []  - 0 Routine Transfer to another Facility (non-emergent condition) []  - 0 Routine Hospital Admission (non-emergent condition) []  - 0 New Admissions / Manufacturing engineer / Ordering NPWT Apligraf, etc. , []  - 0 Emergency Hospital Admission (emergent condition) []  - 0 Simple Discharge Coordination []  - 0 Complex (extensive) Discharge Coordination PROCESS - Special Needs []  - 0 Pediatric / Minor Patient Management []  - 0 Isolation Patient Management []  - 0 Hearing / Language / Visual special needs []  - 0 Assessment of  Community assistance (transportation, D/C planning, etc.) []  - 0 Additional assistance / Altered mentation []  - 0 Support Surface(s) Assessment (bed, cushion, seat, etc.) INTERVENTIONS -  Wound Cleansing / Measurement X - Simple Wound Cleansing - one wound 1 5 []  - 0 Complex Wound Cleansing - multiple wounds X- 1 5 Wound Imaging (photographs - any number of wounds) []  - 0 Wound Tracing (instead of photographs) X- 1 5 Simple Wound Measurement - one wound []  - 0 Complex Wound Measurement - multiple wounds INTERVENTIONS - Wound Dressings X - Small Wound Dressing one or multiple wounds 1 10 []  - 0 Medium Wound Dressing one or multiple wounds []  - 0 Large Wound Dressing one or multiple wounds []  - 0 Application of Medications - topical []  - 0 Application of Medications - injection INTERVENTIONS - Miscellaneous []  - 0 External ear exam []  - 0 Specimen Collection (cultures, biopsies, blood, body fluids, etc.) []  - 0 Specimen(s) / Culture(s) sent or taken to Lab for analysis []  - 0 Patient Transfer (multiple staff / Nurse, adult / Similar devices) []  - 0 Simple Staple / Suture removal (25 or less) []  - 0 Complex Staple / Suture removal (26 or more) []  - 0 Hypo / Hyperglycemic Management (close monitor of Blood Glucose) [name] [name], [name] [name] ([id number]) [id number] Page 3 of 9 []  - 0 Ankle / Brachial Index (ABI) - do not check if billed separately X- 1 5 Vital Signs Has the patient been seen at the hospital within the last three years: Yes Total Score: 80 Level Of Care: New/Established - Level 3 Electronic Signature(s) Signed: 08/12/2022 12:15:24 PM By: Redmond Pulling RN, BSN Entered By: Redmond Pulling on 08/12/2022 11:02:38 -------------------------------------------------------------------------------- Encounter Discharge Information Details Patient Name: Date of Service: [name] Milo D. 08/12/2022 10:30 A M Medical Record Number: [id number] Patient  Account Number: [id number] Date of Birth/Sex: Treating RN: [birth year] (57 y.o. [name] [name] Primary Care Kincade Granberg: [name] Other Clinician: Referring Xiara Knisley: Treating Chairty Toman/Extender: [name], [name]: 4 Encounter Discharge Information Items Discharge Condition: Stable Ambulatory Status: Wheelchair Discharge Destination: Home Transportation: Private Auto Accompanied By: self Schedule Follow-up Appointment: Yes Clinical Summary of Care: Patient Declined Electronic Signature(s) Signed: 08/12/2022 12:15:24 PM By: Redmond Pulling RN, BSN Entered By: Redmond Pulling on 08/12/2022 11:03:18 -------------------------------------------------------------------------------- Lower Extremity Assessment Details Patient Name: Date of Service: [name] Milo D. 08/12/2022 10:30 A M Medical Record Number: [id number] Patient Account Number: [id number] Date of Birth/Sex: Treating RN: 18-Jan-1966 (57 y.o. [name] [name] Primary Care Tellis Spivak: [name] Other Clinician: Referring Placida Cambre: Treating Hannie Shoe/Extender: [name], [name]: 4 Edema Assessment Assessed: [Left: No] [Right: No] [Left: Edema] [Right: :] Calf Left: Right: Point of Measurement: From Medial Instep 26.4 cm Ankle Left: Right: Point of Measurement: From Medial Instep 20 cm Vascular Assessment [name] [name], [name] [name] ([id number]) [Right:[id number] Page 4 of 9] Pulses: Dorsalis Pedis Palpable: [Right:Yes] Electronic Signature(s) Signed: 08/12/2022 12:15:24 PM By: Redmond Pulling RN, BSN Entered By: Redmond Pulling on 08/12/2022 10:40:58 -------------------------------------------------------------------------------- Multi Wound Chart Details Patient Name: Date of Service: [name] Milo D. 08/12/2022 10:30 A M Medical Record Number: [id number] Patient Account Number: [id number] Date of Birth/Sex: Treating  RN: January 02, 1966 (57 y.o. F) Primary Care Rai Severns: [name] Other Clinician: Referring Margarie Mcguirt: Treating Markeda Narvaez/Extender: [name], [name]: 4 Vital Signs Height(in): 65 Capillary Blood Glucose(mg/dl): 93 Weight(lbs): 433 Pulse(bpm): 72 Body Mass Index(BMI): 17.5 Blood Pressure(mmHg): 142/90 Temperature(F): 98.4 Respiratory Rate(breaths/min): 18 [4:Photos:] [N/A:N/A] Right Calcaneus N/A N/A Wound Location: Gradually Appeared N/A N/A Wounding Event: Diabetic Wound/Ulcer of the Lower N/A N/A Primary Etiology:  Extremity Hypertension, Type I Diabetes, N/A N/A Comorbid History: Osteoarthritis, Osteomyelitis, Neuropathy 05/30/2022 N/A N/A Date Acquired: 4 N/A N/A Weeks of Treatment: Open N/A N/A Wound Status: No N/A N/A Wound Recurrence: 2.7x2.1x0.3 N/A N/A Measurements L x W x D (cm) 4.453 N/A N/A A (cm) : rea 1.336 N/A N/A Volume (cm) : 48.90% N/A N/A % Reduction in A rea: 48.90% N/A N/A % Reduction in Volume: Grade 2 N/A N/A Classification: Medium N/A N/A Exudate A mount: Serosanguineous N/A N/A Exudate Type: red, brown N/A N/A Exudate Color: Large (67-100%) N/A N/A Granulation A mount: Red N/A N/A Granulation Quality: Small (1-33%) N/A N/A Necrotic A mount: Fat Layer (Subcutaneous Tissue): Yes N/A N/A Exposed Structures: Fascia: No Tendon: No Muscle: No Joint: No Bone: No None N/A N/A Epithelialization: No Abnormalities Noted N/A N/A Periwound Skin Texture: No Abnormalities Noted N/A N/A Periwound Skin Moisture: No Abnormalities Noted N/A N/A Periwound Skin Color: Hot N/A N/A TemperatureREAL, [name] [name] ([id number]) [id number] Page 5 of 9 Treatment Notes Electronic Signature(s) Signed: 08/15/2022 12:26:35 PM By: [name] DO Entered By: [name] on 08/12/2022  11:01:54 -------------------------------------------------------------------------------- Multi-Disciplinary Care Plan Details Patient Name: Date of Service: [name] Milo D. 08/12/2022 10:30 A M Medical Record Number: [id number] Patient Account Number: [id number] Date of Birth/Sex: Treating RN: 07-23-1965 (57 y.o. [name] [name] Primary Care Montrelle Eddings: [name] Other Clinician: Referring Samiha Denapoli: Treating Artice Holohan/Extender: [name], [name]: 4 Active Inactive HBO Nursing Diagnoses: Anxiety related to feelings of confinement associated with the hyperbaric oxygen chamber Anxiety related to knowledge deficit of hyperbaric oxygen therapy and treatment procedures Potential for barotraumas to ears, sinuses, teeth, and lungs or cerebral gas embolism related to changes in atmospheric pressure inside hyperbaric oxygen chamber Potential for oxygen toxicity seizures related to delivery of 100% oxygen at an increased atmospheric pressure Potential for pulmonary oxygen toxicity related to delivery of 100% oxygen at an increased atmospheric pressure Goals: Patient and/or family will be able to state/discuss factors appropriate to the management of their disease process during treatment Date Initiated: 07/12/2022 Target Resolution Date: 08/09/2022 Goal Status: Active Patient will tolerate the hyperbaric oxygen therapy treatment Date Initiated: 07/12/2022 Target Resolution Date: 08/09/2022 Goal Status: Active Patient/caregiver will verbalize understanding of HBO goals, rationale, procedures and potential hazards Date Initiated: 07/12/2022 Date Inactivated: 08/12/2022 Target Resolution Date: 08/09/2022 Goal Status: Met Interventions: Administer a five (5) minute air break for patient if signs and symptoms of seizure appear and notify the hyperbaric physician Administer a ten (10) minute air break for patient if signs and symptoms of seizure appear and  notify the hyperbaric physician Administer decongestants, per physician orders, prior to HBO2 Administer the correct therapeutic gas delivery based on the patients needs and limitations, per physician order Assess and provide for patients comfort related to the hyperbaric environment and equalization of middle ear Assess for signs and symptoms related to adverse events, including but not limited to confinement anxiety, pneumothorax, oxygen toxicity and baurotrauma Assess patient for any history of confinement anxiety Assess patient's knowledge and expectations regarding hyperbaric medicine and provide education related to the hyperbaric environment, goals of treatment and prevention of adverse events Implement protocols to decrease risk of pneumothorax in high risk patients Notes: Nutrition Nursing Diagnoses: Imbalanced nutrition Impaired glucose control: actual or potential Goals: Patient/caregiver verbalizes understanding of need to maintain therapeutic glucose control per primary care physician Date Initiated: 07/12/2022 Target Resolution Date: 08/02/2022 Goal Status: Active Patient/caregiver will maintain therapeutic glucose control Date  Initiated: 07/12/2022 Target Resolution Date: 08/26/2022 [name] [name], [name] [name] ([id number]) [phone].pdf Page 6 of 9 Goal Status: Active Interventions: Assess HgA1c results as ordered upon admission and as needed Assess patient nutrition upon admission and as needed per policy Provide education on elevated blood sugars and impact on wound healing Provide education on nutrition Treatment Activities: Education provided on Nutrition : 07/12/2022 Notes: Osteomyelitis Nursing Diagnoses: Infection: osteomyelitis Knowledge deficit related to disease process and management Goals: Patient/caregiver will verbalize understanding of disease process and disease management Date Initiated: 07/12/2022 Date Inactivated: 08/12/2022 Target  Resolution Date: 08/18/2022 Goal Status: Met Patient's osteomyelitis will resolve Date Initiated: 07/12/2022 Target Resolution Date: 10/28/2022 Goal Status: Active Signs and symptoms for osteomyelitis will be recognized and promptly addressed Date Initiated: 07/12/2022 Target Resolution Date: 09/13/2022 Goal Status: Active Interventions: Assess for signs and symptoms of osteomyelitis resolution every visit Provide education on osteomyelitis Notes: Wound/Skin Impairment Nursing Diagnoses: Impaired tissue integrity Knowledge deficit related to ulceration/compromised skin integrity Goals: Patient/caregiver will verbalize understanding of skin care regimen Date Initiated: 07/12/2022 Target Resolution Date: 08/09/2022 Goal Status: Active Ulcer/skin breakdown will have a volume reduction of 30% by week 4 Date Initiated: 07/12/2022 Target Resolution Date: 08/09/2022 Goal Status: Active Interventions: Assess patient/caregiver ability to obtain necessary supplies Assess patient/caregiver ability to perform ulcer/skin care regimen upon admission and as needed Assess ulceration(s) every visit Provide education on ulcer and skin care Notes: Electronic Signature(s) Signed: 08/12/2022 12:15:24 PM By: Redmond Pulling RN, BSN Entered By: Redmond Pulling on 08/12/2022 10:49:18 -------------------------------------------------------------------------------- Pain Assessment Details Patient Name: Date of Service: [name] Milo D. 08/12/2022 10:30 A M Medical Record Number: [id number] Patient Account Number: [id number] Date of Birth/Sex: Treating RN: [birth year] (57 y.o. [name] [name] Primary Care Storm Dulski: [name] Other Clinician: TAMECCA, [name] [name] ([id number]) [id number] Page 7 of 9 Referring Kristiana Jacko: Treating Jissel Slavens/Extender: [name], [name]: 4 Active Problems Location of Pain Severity and Description of Pain Patient Has  Paino No Site Locations Pain Management and Medication Current Pain Management: Electronic Signature(s) Signed: 08/12/2022 12:15:24 PM By: Redmond Pulling RN, BSN Entered By: Redmond Pulling on 08/12/2022 10:37:51 -------------------------------------------------------------------------------- Patient/Caregiver Education Details Patient Name: Date of Service: [name] [name] 6/14/2024andnbsp10:30 A M Medical Record Number: [id number] Patient Account Number: [id number] Date of Birth/Gender: Treating RN: Jul 06, 1965 (57 y.o. [name] [name] Primary Care Physician: [name] Other Clinician: Referring Physician: Treating Physician/Extender: Caleen Essex GESH Weeks in Treatment: 4 Education Assessment Education Provided To: Patient Education Topics Provided Wound/Skin Impairment: Methods: Explain/Verbal Responses: State content correctly Electronic Signature(s) Signed: 08/12/2022 12:15:24 PM By: Redmond Pulling RN, BSN Entered By: Redmond Pulling on 08/12/2022 10:49:37 [name] [name] ([id number]) [id number] Page 8 of 9 -------------------------------------------------------------------------------- Wound Assessment Details Patient Name: Date of Service: [name] [name], [name] [name] 08/12/2022 10:30 A M Medical Record Number: [id number] Patient Account Number: [id number] Date of Birth/Sex: Treating RN: 16-Jun-1965 (57 y.o. [name] [name] Primary Care Hopie Pellegrin: [name] Other Clinician: Referring Londyn Hotard: Treating Tanji Storrs/Extender: [name], [name]: 4 Wound Status Wound Number: 4 Primary Diabetic Wound/Ulcer of the Lower Extremity Etiology: Wound Location: Right Calcaneus Wound Status: Open Wounding Event: Gradually Appeared Comorbid Hypertension, Type I Diabetes, Osteoarthritis, Osteomyelitis, Date Acquired: 05/30/2022 History: Neuropathy Weeks Of Treatment: 4 Clustered Wound:  No Photos Wound Measurements Length: (cm) 2.7 Width: (cm) 2.1 Depth: (cm) 0.3 Area: (cm) 4.453 Volume: (cm) 1.336 % Reduction in Area: 48.9% % Reduction in Volume: 48.9% Epithelialization: None  Tunneling: No Undermining: No Wound Description Classification: Grade 2 Exudate Amount: Medium Exudate Type: Serosanguineous Exudate Color: red, brown Foul Odor After Cleansing: No Slough/Fibrino Yes Wound Bed Granulation Amount: Large (67-100%) Exposed Structure Granulation Quality: Red Fascia Exposed: No Necrotic Amount: Small (1-33%) Fat Layer (Subcutaneous Tissue) Exposed: Yes Necrotic Quality: Adherent Slough Tendon Exposed: No Muscle Exposed: No Joint Exposed: No Bone Exposed: No Periwound Skin Texture Texture Color No Abnormalities Noted: Yes No Abnormalities Noted: Yes Moisture Temperature / Pain No Abnormalities Noted: Yes Temperature: Hot Treatment Notes Wound #4 (Calcaneus) Wound Laterality: Right Cleanser Soap and Water Discharge Instruction: May shower and wash wound with dial antibacterial soap and water prior to dressing change. [name] [name], [name] [name] ([id number]) [id number] Page 9 of 9 Peri-Wound Care Topical Primary Dressing Dakin's Solution 0.25%, 16 (oz) Discharge Instruction: Moisten gauze with Dakin's solution Secondary Dressing Secured With Compression Wrap Compression Stockings Add-Ons Electronic Signature(s) Signed: 08/12/2022 12:15:24 PM By: Redmond Pulling RN, BSN Entered By: Redmond Pulling on 08/12/2022 10:43:15 -------------------------------------------------------------------------------- Vitals Details Patient Name: Date of Service: [name] Milo D. 08/12/2022 10:30 A M Medical Record Number: [id number] Patient Account Number: [id number] Date of Birth/Sex: Treating RN: 05-Mar-1965 (57 y.o. F) Primary Care Christina Waldrop: [name] Other Clinician: Referring Chaselynn Kepple: Treating Nyimah Shadduck/Extender: [name], [name]: 4 Vital Signs Time Taken: 10:04 Temperature (F): 98.4 Height (in): 65 Pulse (bpm): 72 Weight (lbs): 105 Respiratory Rate (breaths/min): 18 Body Mass Index (BMI): 17.5 Blood Pressure (mmHg): 142/90 Capillary Blood Glucose (mg/dl): 93 Reference Range: 80 - 120 mg / dl Electronic Signature(s) Signed: 08/12/2022 10:29:12 AM By: Haywood Pao CHT EMT BS , , Entered By: Haywood Pao on 08/12/2022 10:29:12

## 2022-08-13 NOTE — Progress Notes (Signed)
[name] [name], [name] [name] ([id number]) [id number] Page 1 of 2 Visit Report for 08/11/2022 HBO Details Patient Name: Date of Service: [name], [name] [name] 08/11/2022 10:00 A M Medical Record Number: [id number] Patient Account Number: [id number] Date of Birth/Sex: Treating RN: 03-Jul-1965 (57 y.o. [name] [name], [name] [name] Primary Care Marcellis Frampton: [name] Other Clinician: Haywood Pao Referring Dayanne Yiu: Treating Vester Titsworth/Extender: [name], [name]: 4 HBO Treatment Course Details Treatment Course Number: 2 Ordering Khoi Hamberger: [name] T Treatments Ordered: otal 40 HBO Treatment Start Date: 07/18/2022 HBO Indication: Diabetic Ulcer(s) of the Lower Extremity Standard/Conservative Wound Care tried and failed greater than or equal to 30 days Wound #4 Right Calcaneus HBO Treatment Details Treatment Number: 16 Patient Type: Outpatient Chamber Type: Monoplace Chamber Serial #: [id number] Treatment Protocol: 2.0 ATA with 90 minutes oxygen, with two 5 minute air breaks Treatment Details Compression Rate Down: 1.5 psi / minute De-Compression Rate Up: A breaks and breathing ir Compress Tx Pressure periods Decompress Decompress Begins Reached (leave unused spaces Begins Ends blank) Chamber Pressure (ATA 1 2 2 2 2 2  --2 1 ) Clock Time (24 hr) 09:58 10:11 10:41 10:46 11:16 11:21 - - 11:51 12:08 Treatment Length: 130 (minutes) Treatment Segments: 4 Vital Signs Capillary Blood Glucose Reference Range: 80 - 120 mg / dl HBO Diabetic Blood Glucose Intervention Range: <131 mg/dl or >742 mg/dl Type: Time Vitals Blood Pulse: Respiratory Temperature: Capillary Blood Glucose Pulse Action Taken: Pressure: Rate: Glucose (mg/dl): Meter #: Oximetry (%) Taken: Pre 09:37 148/86 78 18 97.3 290 1 asymptomatic for hyperglycemia Post 13:04 153/97 78 18 97.3 175 1 none per protocol Treatment Response Treatment Toleration: Well Treatment Completion  Status: Treatment Completed without Adverse Event Treatment Notes [name] arrived with blood glucose level of 290 mg/dL, otherwise vital signs all normal. She denied symptoms of hyperglycemia. She prepared for treatment. After performing a safety check, patient was placed in the chamber which was compressed with 100% oxygen at a rate of 2 psi/min after confirming normal ear equalization. She tolerated the treatment and decompression of the chamber at a rate of 2 psi/min. She denied any issues with ear equalization and/or pain associated with barotrauma. Her blood glucose level was 175 mg/dL, post-treatment. She denied any symptoms of hyperglycemia. She was stable upon discharge with driver. Additional Procedure Documentation Tissue Sevierity: Necrosis of bone Physician HBO Attestation: I certify that I supervised this HBO treatment in accordance with Medicare guidelines. A trained emergency response team is readily available per Yes hospital policies and procedures. Continue HBOT as ordered. Yes Electronic Signature(s) Janaiyah Feo, [place] D ([id number]) Unknown Foley [phone].pdf Page 2 of 2 Previous Signature: 08/11/2022 2:35:18 PM Version By: Haywood Pao CHT EMT BS , , Entered By: [name] on 08/12/2022 13:05:30 -------------------------------------------------------------------------------- HBO Safety Checklist Details Patient Name: Date of Service: [name] [name] 08/11/2022 10:00 A M Medical Record Number: [id number] Patient Account Number: [id number] Date of Birth/Sex: Treating RN: 30-Mar-1965 (57 y.o. [name] Primary Care Yaiza Palazzola: [name] Other Clinician: Haywood Pao Referring Djuan Talton: Treating Cartrell Bentsen/Extender: [name], [name]: 4 HBO Safety Checklist Items Safety Checklist Consent Form Signed Patient voided / foley secured and emptied When did you last eato 0700 cereal  banana Last dose of injectable or oral agent 0700 Ostomy pouch emptied and vented if applicable NA All implantable devices assessed, documented and approved NA Intravenous access site secured and place NA Valuables secured Linens and cotton and cotton/polyester blend (less than  51% polyester) Personal oil-based products / skin lotions / body lotions removed Wigs or hairpieces removed NA Smoking or tobacco materials removed NA Books / newspapers / magazines / loose paper removed Cologne, aftershave, perfume and deodorant removed Jewelry removed (may wrap wedding band) Make-up removed Hair care products removed Human Hair extensions approved Libre 3 Approved, otherwise all electronics Battery operated devices (external) removed removed. Heating patches and chemical warmers removed NA Titanium eyewear removed Nail polish cured greater than 10 hours greater than 10 hours Casting material cured greater than 10 hours NA Hearing aids removed NA Loose dentures or partials removed NA Prosthetics have been removed NA Patient demonstrates correct use of air break device (if applicable) Patient concerns have been addressed Patient grounding bracelet on and cord attached to chamber Specifics for Inpatients (complete in addition to above) Medication sheet sent with patient NA Intravenous medications needed or due during therapy sent with patient NA Drainage tubes (e.g. nasogastric tube or chest tube secured and vented) NA Endotracheal or Tracheotomy tube secured NA Cuff deflated of air and inflated with saline NA Airway suctioned NA Notes Paper version used prior to treatment start. Electronic Signature(s) Signed: 08/11/2022 2:36:12 PM By: Haywood Pao CHT EMT BS , , Previous Signature: 08/11/2022 2:29:30 PM Version By: Haywood Pao CHT EMT BS , , Entered By: Haywood Pao on 08/11/2022 14:36:11

## 2022-08-13 NOTE — Progress Notes (Signed)
TIRZA, MAULER ([id number]) [id number] Page 1 of 2 Visit Report for 08/11/2022 Arrival Information Details Patient Name: Date of Service: DIAMANI, BERGES 08/11/2022 10:00 A M Medical Record Number: [id number] Patient Account Number: [id number] Date of Birth/Sex: Treating RN: 1965-03-17 (57 y.o. [name] Primary Care Antanette Richwine: [name] Other Clinician: Haywood Pao Referring Aundray Cartlidge: Treating Rhyan Radler/Extender: [name], [name]: 4 Visit Information History Since Last Visit All ordered tests and consults were completed: Yes Patient Arrived: Wheel Chair Added or deleted any medications: No Arrival Time: 09:29 Any new allergies or adverse reactions: No Accompanied By: driver Had a fall or experienced change in No Transfer Assistance: None activities of daily living that may affect Patient Identification Verified: Yes risk of falls: Secondary Verification Process Completed: Yes Signs or symptoms of abuse/neglect since last visito No Patient Requires Transmission-Based Precautions: No Hospitalized since last visit: No Patient Has Alerts: No Implantable device outside of the clinic excluding No cellular tissue based products placed in the center since last visit: Pain Present Now: No Electronic Signature(s) Signed: 08/11/2022 2:13:57 PM By: Haywood Pao CHT EMT BS , , Entered By: Haywood Pao on 08/11/2022 14:13:57 -------------------------------------------------------------------------------- Encounter Discharge Information Details Patient Name: Date of Service: [name] 08/11/2022 10:00 A M Medical Record Number: [id number] Patient Account Number: [id number] Date of Birth/Sex: Treating RN: 1965/05/17 (57 y.o. [name] Primary Care Demitrios Molyneux: [name] Other Clinician: Haywood Pao Referring Herma Uballe: Treating Favor Hackler/Extender: Caleen Essex GESH Weeks in Treatment: 4 Encounter Discharge Information Items Discharge Condition: Stable Ambulatory Status: Wheelchair Discharge Destination: Home Transportation: Other Accompanied By: driver Schedule Follow-up Appointment: No Clinical Summary of Care: Electronic Signature(s) Signed: 08/11/2022 2:37:49 PM By: Haywood Pao CHT EMT BS , , Entered By: Haywood Pao on 08/11/2022 14:37:49 [name] ([id number]) [phone].pdf Page 2 of 2 -------------------------------------------------------------------------------- Vitals Details Patient Name: Date of Service: MILLANA, STAHL 08/11/2022 10:00 A M Medical Record Number: [id number] Patient Account Number: [id number] Date of Birth/Sex: Treating RN: 11-Jan-1966 (57 y.o. [name] Primary Care Pallie Swigert: [name] Other Clinician: Haywood Pao Referring Jaquelyne Firkus: Treating Kissa Campoy/Extender: [name], [name]: 4 Vital Signs Time Taken: 09:37 Temperature (F): 97.3 Height (in): 65 Pulse (bpm): 78 Weight (lbs): 105 Respiratory Rate (breaths/min): 18 Body Mass Index (BMI): 17.5 Blood Pressure (mmHg): 148/86 Capillary Blood Glucose (mg/dl): 016 Reference Range: 80 - 120 mg / dl Electronic Signature(s) Signed: 08/11/2022 2:25:44 PM By: Haywood Pao CHT EMT BS , , Entered By: Haywood Pao on 08/11/2022 14:25:44

## 2022-08-13 NOTE — Progress Notes (Signed)
[name] [name] ([id number]) [id number] Page 1 of 2 Visit Report for 08/12/2022 HBO Details Patient Name: Date of Service: [name] [name], [name] [name] 08/12/2022 8:00 A M Medical Record Number: [id number] Patient Account Number: [id number] Date of Birth/Sex: Treating RN: 12-04-1965 (57 y.o. [name] [name] Primary Care [name] [name]: [name] [name] Other Clinician: Karl [name] Referring [name] [name]: Treating [name] [name]/Extender: [name] [name] GESH Weeks in Treatment: 4 HBO Treatment Course Details Treatment Course Number: 2 Ordering [name] [name]: [name] [name] T Treatments Ordered: otal 40 HBO Treatment Start Date: 07/18/2022 HBO Indication: Diabetic Ulcer(s) of the Lower Extremity Standard/Conservative Wound Care tried and failed greater than or equal to 30 days Wound #4 Right Calcaneus HBO Treatment Details Treatment Number: 17 Patient Type: Outpatient Chamber Type: Monoplace Chamber Serial #: [id number] Treatment Protocol: 2.0 ATA with 90 minutes oxygen, with two 5 minute air breaks Treatment Details Compression Rate Down: 1.5 psi / minute De-Compression Rate Up: 2.0 psi / minute A breaks and breathing ir Compress Tx Pressure periods Decompress Decompress Begins Reached (leave unused spaces Begins Ends blank) Chamber Pressure (ATA 1 2 2 2 2 2  --2 1 ) Clock Time (24 hr) 08:01 08:12 08:42 08:47 09:17 09:22 - - 09:52 09:59 Treatment Length: 118 (minutes) Treatment Segments: 4 Vital Signs Capillary Blood Glucose Reference Range: 80 - 120 mg / dl HBO Diabetic Blood Glucose Intervention Range: <131 mg/dl or >782 mg/dl Time Vitals Blood Respiratory Capillary Blood Glucose Pulse Action Type: Pulse: Temperature: Taken: Pressure: Rate: Glucose (mg/dl): Meter #: Oximetry (%) Taken: Pre 07:41 132/96 77 18 97.2 244 Post 10:04 142/90 72 18 98.4 93 Treatment Response Treatment Toleration: Well Treatment Completion Status: Treatment Completed without  Adverse Event Treatment Notes Post treatment blood sugar was 93. She was given 8 oz of Glucerna to drink. She was also given 8 oz of apple juice with some snacks that she had with her per Dr. Mikey Bussing. The patient had a wound care visit after treatment and was seen by Dr. Mikey Bussing. Additional Procedure Documentation Tissue Sevierity: Necrosis of bone Physician HBO Attestation: I certify that I supervised this HBO treatment in accordance with Medicare guidelines. A trained emergency response team is readily available per Yes hospital policies and procedures. Continue HBOT as ordered. Yes Electronic Signature(s) Signed: 08/15/2022 12:31:21 PM By: [name] Corwin DO Previous Signature: 08/12/2022 11:55:32 AM Version By: [name] [name] EMT Previous Signature: 08/15/2022 12:26:35 PM Version By: Audley Hose, Vivianne Spence ([id number]) [id number] Page 2 of 2 Entered By: [name] [name] on 08/15/2022 12:27:21 -------------------------------------------------------------------------------- HBO Safety Checklist Details Patient Name: Date of Service: [name] [name] 08/12/2022 8:00 A M Medical Record Number: [id number] Patient Account Number: [id number] Date of Birth/Sex: Treating RN: Aug 04, 1965 (57 y.o. [name] [name] Primary Care Wrangler Penning: [name] [name] Other Clinician: Karl [name] Referring Cleo Villamizar: Treating O[place]ale Marney/Extender: [name], [name]: 4 HBO Safety Checklist Items Safety Checklist Consent Form Signed Patient voided / foley secured and emptied When did you last eato 0700 Last dose of injectable or oral agent 0700 Ostomy pouch emptied and vented if applicable NA All implantable devices assessed, documented and approved NA Intravenous access site secured and place NA Valuables secured Linens and cotton and cotton/polyester blend (less than 51% polyester) Personal oil-based products / skin lotions /  body lotions removed Wigs or hairpieces removed Human Extensions approved Smoking or tobacco materials removed Books / newspapers / magazines / loose paper removed Cologne, aftershave, perfume and deodorant removed Jewelry removed (may wrap wedding  band) Make-up removed Hair care products removed Battery operated devices (external) removed Heating patches and chemical warmers removed Titanium eyewear removed NA Nail polish cured greater than 10 hours over 75 weeks old Casting material cured greater than 10 hours NA Hearing aids removed NA Loose dentures or partials removed NA Prosthetics have been removed NA Patient demonstrates correct use of air break device (if applicable) Patient concerns have been addressed Patient grounding bracelet on and cord attached to chamber Specifics for Inpatients (complete in addition to above) Medication sheet sent with patient NA Intravenous medications needed or due during therapy sent with patient NA Drainage tubes (e.g. nasogastric tube or chest tube secured and vented) NA Endotracheal or Tracheotomy tube secured NA Cuff deflated of air and inflated with saline NA Airway suctioned NA Notes The safety checklist was done before the treatment was started. Electronic Signature(s) Signed: 08/12/2022 11:51:02 AM By: [name] [name] EMT Entered By: [name] [name] on 08/12/2022 11:51:01

## 2022-08-15 ENCOUNTER — Encounter (HOSPITAL_BASED_OUTPATIENT_CLINIC_OR_DEPARTMENT_OTHER): Payer: BC Managed Care – PPO | Admitting: Internal Medicine

## 2022-08-15 DIAGNOSIS — L97514 Non-pressure chronic ulcer of other part of right foot with necrosis of bone: Secondary | ICD-10-CM

## 2022-08-15 DIAGNOSIS — M86671 Other chronic osteomyelitis, right ankle and foot: Secondary | ICD-10-CM

## 2022-08-15 DIAGNOSIS — E10621 Type 1 diabetes mellitus with foot ulcer: Secondary | ICD-10-CM

## 2022-08-15 LAB — GLUCOSE, CAPILLARY
Glucose-Capillary: 144 mg/dL — ABNORMAL HIGH (ref 70–99)
Glucose-Capillary: 178 mg/dL — ABNORMAL HIGH (ref 70–99)
Glucose-Capillary: 172 mg/dL — ABNORMAL HIGH (ref 70–99)
Glucose-Capillary: 191 mg/dL — ABNORMAL HIGH (ref 70–99)

## 2022-08-15 NOTE — Progress Notes (Addendum)
DEEM, DONLON ([id number]) [id number] Page 1 of 3 Visit Report for 08/15/2022 HBO Details Patient Name: Date of Service: [name] [name], [name] [name] 08/15/2022 8:00 A M Medical Record Number: [id number] Patient Account Number: [id number] Date of Birth/Sex: Treating RN: 1966/02/27 (57 y.o. [name], [name] Primary Care Tiearra Colwell: [name] Other Clinician: Haywood Pao Referring Redell Bhandari: Treating Georgio Hattabaugh/Extender: [name], [name]: 4 HBO Treatment Course Details Treatment Course Number: 2 Ordering Ayonna Speranza: [name] T Treatments Ordered: otal 40 HBO Treatment Start Date: 07/18/2022 HBO Indication: Diabetic Ulcer(s) of the Lower Extremity Standard/Conservative Wound Care tried and failed greater than or equal to 30 days Wound #4 Right Calcaneus HBO Treatment Details Treatment Number: 18 Patient Type: Outpatient Chamber Type: Monoplace Chamber Serial #: [id number] Treatment Protocol: 2.0 ATA with 90 minutes oxygen, with two 5 minute air breaks Treatment Details Compression Rate Down: 1.5 psi / minute De-Compression Rate Up: 2.0 psi / minute A breaks and breathing ir Compress Tx Pressure periods Decompress Decompress Begins Reached (leave unused spaces Begins Ends blank) Chamber Pressure (ATA 1 2 2 2 2 2  --2 1 ) Clock Time (24 hr) 08:46 08:58 09:28 09:33 10:03 10:08 - - 10:38 10:45 Treatment Length: 119 (minutes) Treatment Segments: 4 Vital Signs Capillary Blood Glucose Reference Range: 80 - 120 mg / dl HBO Diabetic Blood Glucose Intervention Range: <131 mg/dl or >782 mg/dl Type: Time Vitals Blood Respiratory Capillary Blood Glucose Pulse Action Pulse: Temperature: Taken: Pressure: Rate: Glucose (mg/dl): Meter #: Oximetry (%) Taken: Pre 07:48 103/69 79 18 98.1 178 2 see note Pre 08:15 144 2 Pre 07:47 178 2 Post 10:51 138/83 78 18 97.3 191 1 none per protocol Treatment Response Treatment Toleration:  Well Treatment Completion Status: Treatment Completed without Adverse Event Treatment Notes [name] [name] arrived with normal vital signs. Blood Glucose Level was 178 mg/dL Her Libre 3 was showing that blood glucose was decreasing. As a safety measure blood glucose was re-measured when patient was prepared for treatment. Informed Dr. Mikey Bussing. Patient given 8 oz orange juice and 8 oz Glucerna. At 0815 CBG was 144 mg/dL per [facility]. At 0840 CBG was 172 mg/dL per [facility]. Safety check was completed during time of waiting. An water bottle was emptied and filled with 8 oz orange juice to go with patient for safety. Patient was placed in the chamber which was compressed at a rate of 2 psi/min after confirming normal ear equalization. She tolerated treatment and decompression at 2 psi/min. She did consume the orange juice prior to chamber exit. Post-treatment glucose was 191 mg/dL. Other post-treatment vital signs were within normal range. She was stable upon discharge. Additional Procedure Documentation Tissue Sevierity: Necrosis of bone Physician HBO Attestation: I certify that I supervised this HBO treatment in accordance with Medicare guidelines. A trained emergency response team is readily available per Yes hospital policies and procedures. Continue HBOT as ordered. [address] JOWANDA, KEMPF ([id number]) [id number] Page 2 of 3 Electronic Signature(s) Signed: 08/16/2022 3:47:10 PM By: [name] DO Previous Signature: 08/15/2022 1:15:13 PM Version By: Haywood Pao CHT EMT BS , , Previous Signature: 08/15/2022 4:37:01 PM Version By: [name] DO Previous Signature: 08/15/2022 9:12:03 AM Version By: Haywood Pao CHT EMT BS , , Entered By: [name] on 08/16/2022 15:46:01 -------------------------------------------------------------------------------- HBO Safety Checklist Details Patient Name: Date of Service: [name] Milo D.  08/15/2022 8:00 A M Medical Record Number: [id number] Patient Account Number: [id number] Date of Birth/Sex: Treating RN: 1965/05/08 (57 y.o. F) Deaton,  Bobbi Primary Care Decklyn Hornik: [name] Other Clinician: Haywood Pao Referring Toi Stelly: Treating Tyrice Hewitt/Extender: [name], [name]: 4 HBO Safety Checklist Items Safety Checklist Consent Form Signed Patient voided / foley secured and emptied When did you last eato 0700 peanut butter toast, fruit Last dose of injectable or oral agent 0700 5 units humalog Ostomy pouch emptied and vented if applicable NA All implantable devices assessed, documented and approved NA Intravenous access site secured and place NA Valuables secured Linens and cotton and cotton/polyester blend (less than 51% polyester) Personal oil-based products / skin lotions / body lotions removed Wigs or hairpieces removed NA Smoking or tobacco materials removed NA Books / newspapers / magazines / loose paper removed Cologne, aftershave, perfume and deodorant removed Jewelry removed (may wrap wedding band) Make-up removed Hair care products removed Human Hair extensions approved Libre 3 Approved, otherwise all electronics Battery operated devices (external) removed removed. Heating patches and chemical warmers removed Titanium eyewear removed Nail polish cured greater than 10 hours greater than 10 hours Casting material cured greater than 10 hours NA Hearing aids removed NA Loose dentures or partials removed NA Prosthetics have been removed NA Patient demonstrates correct use of air break device (if applicable) Patient concerns have been addressed Patient grounding bracelet on and cord attached to chamber Specifics for Inpatients (complete in addition to above) Medication sheet sent with patient NA Intravenous medications needed or due during therapy sent with patient NA Drainage tubes (e.g. nasogastric  tube or chest tube secured and vented) NA Endotracheal or Tracheotomy tube secured NA Cuff deflated of air and inflated with saline NA Airway suctioned NA Notes Paper version used prior to treatment start. Electronic Signature(s) Signed: 08/15/2022 3:36:11 PM By: Haywood Pao CHT EMT BS , , Previous Signature: 08/15/2022 9:08:58 AM Version By: Haywood Pao CHT EMT BS , , Entered By: Haywood Pao on 08/15/2022 15:36:11 [name] [name] ([id number]) [id number] Page 3 of 3

## 2022-08-15 NOTE — Progress Notes (Addendum)
[name] [name] ([id number]) [id number] Page 1 of 2 Visit Report for 08/15/2022 Arrival Information Details Patient Name: Date of Service: [name] [name] [name] 08/15/2022 8:00 A M Medical Record Number: [id number] Patient Account Number: [id number] Date of Birth/Sex: Treating RN: 13-Apr-1965 (57 y.o. [name], [name] Primary Care Khang Hannum: [name] Other Clinician: Haywood Pao Referring Mirta Mally: Treating Keirra Zeimet/Extender: [name], [name]: 4 Visit Information History Since Last Visit All ordered tests and consults were completed: Yes Patient Arrived: Wheel Chair Added or deleted any medications: No Arrival Time: 07:30 Any new allergies or adverse reactions: No Accompanied By: spouse Had a fall or experienced change in No Transfer Assistance: None activities of daily living that may affect Patient Identification Verified: Yes risk of falls: Secondary Verification Process Completed: Yes Signs or symptoms of abuse/neglect since last visito No Patient Requires Transmission-Based Precautions: No Hospitalized since last visit: No Patient Has Alerts: No Implantable device outside of the clinic excluding No cellular tissue based products placed in the center since last visit: Pain Present Now: No Electronic Signature(s) Signed: 08/15/2022 1:26:17 PM By: Haywood Pao CHT EMT BS , , Previous Signature: 08/15/2022 9:00:52 AM Version By: Haywood Pao CHT EMT BS , , Entered By: Haywood Pao on 08/15/2022 13:26:17 -------------------------------------------------------------------------------- Encounter Discharge Information Details Patient Name: Date of Service: [name] Milo D. 08/15/2022 8:00 A M Medical Record Number: [id number] Patient Account Number: [id number] Date of Birth/Sex: Treating RN: 03/23/1965 (57 y.o. [name] Primary Care Harini Dearmond: [name] Other Clinician: Haywood Pao Referring Biff Rutigliano: Treating Keysi Oelkers/Extender: [name], [name]: 4 Encounter Discharge Information Items Discharge Condition: Stable Ambulatory Status: Wheelchair Discharge Destination: Home Transportation: Private Auto Accompanied By: spouse Schedule Follow-up Appointment: No Clinical Summary of Care: Electronic Signature(s) Signed: 08/15/2022 1:26:04 PM By: Haywood Pao CHT EMT BS , , Entered By: Haywood Pao on 08/15/2022 13:26:04 [name] ([id number]) [id number] Page 2 of 2 -------------------------------------------------------------------------------- Vitals Details Patient Name: Date of Service: [name] [name] [name] 08/15/2022 8:00 A M Medical Record Number: [id number] Patient Account Number: [id number] Date of Birth/Sex: Treating RN: 1965/05/20 (57 y.o. [name], [name] Primary Care Anjelina Dung: [name] Other Clinician: Haywood Pao Referring Mecca Barga: Treating Janiyah Beery/Extender: [name], [name]: 4 Vital Signs Time Taken: 07:48 Temperature (F): 98.1 Height (in): 65 Pulse (bpm): 79 Weight (lbs): 105 Respiratory Rate (breaths/min): 18 Body Mass Index (BMI): 17.5 Blood Pressure (mmHg): 103/69 Capillary Blood Glucose (mg/dl): 244 Reference Range: 80 - 120 mg / dl Electronic Signature(s) Signed: 08/15/2022 9:01:37 AM By: Haywood Pao CHT EMT BS , , Entered By: Haywood Pao on 08/15/2022 09:01:36

## 2022-08-15 NOTE — Progress Notes (Signed)
[name], [name] ([id number]) [id number] Page 1 of 2 Visit Report for 08/12/2022 Problem List Details Patient Name: Date of Service: [name] [name], [name] [name] 08/12/2022 8:00 A M Medical Record Number: [id number] Patient Account Number: [id number] Date of Birth/Sex: Treating RN: 02-17-1966 (57 y.o. 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 M86.671 Other chronic osteomyelitis, right ankle and foot 07/12/2022 No Yes E10.621 Type 1 diabetes mellitus with foot ulcer 07/12/2022 No Yes Inactive Problems Resolved Problems Electronic Signature(s) Signed: 08/12/2022 11:55:59 AM By: Karl Bales EMT Signed: 08/15/2022 12:26:35 PM By: [name] DO Entered By: Karl Bales on 08/12/2022 11:55:59 -------------------------------------------------------------------------------- SuperBill Details Patient Name: Date of Service: [name] 08/12/2022 Medical Record Number: [id number] Patient Account Number: [id number] Date of Birth/Sex: Treating RN: 19-Dec-1965 (57 y.o. 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 Other chronic osteomyelitis, right ankle and foot E10.621 Type 1 diabetes mellitus with foot ulcer Facility Procedures ADAYAH, GENSON ([id number]): CPT4 Code Description Modifier [id number] G0277-(Facility Use Only) HBOT full body chamber,  , ICD-10 Diagnosis Description E10.621 Type 1 diabetes mellitus with foot ulcer L97.514 Non-pressure chronic ulcer of other part  of right foot with necrosis o M86.671 Other chronic osteomyelitis, right ankle and foot [id number] Page 2 of 2: Quantity 4 f bone Physician Procedures : CPT4 Code Description Modifier [phone] - WC PHYS HYPERBARIC OXYGEN THERAPY ICD-10 Diagnosis Description E10.621 Type 1 diabetes mellitus with foot ulcer L97.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

## 2022-08-15 NOTE — Progress Notes (Signed)
TERRIANA, KNOFF ([id number]) [id number] Page 1 of 1 Visit Report for 08/11/2022 SuperBill Details Patient Name: Date of Service: [name] [name], [name] [name] 08/11/2022 Medical Record Number: [id number] Patient Account Number: [id number] Date of Birth/Sex: Treating RN: [birth year] (57 y.o. 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 Other chronic osteomyelitis, right ankle and foot E10.621 Type 1 diabetes mellitus with foot ulcer Facility Procedures CPT4 Code Description Modifier Quantity [id number] G0277-(Facility Use Only) HBOT full body chamber, , 4 ICD-10 Diagnosis Description E10.621 Type 1 diabetes mellitus with foot ulcer 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

## 2022-08-15 NOTE — Progress Notes (Signed)
OLUWADARA, PELON ([id number]) [id number] Page 1 of 1 Visit Report for 08/15/2022 SuperBill Details Patient Name: Date of Service: [name] [name], [name] [name] 08/15/2022 Medical Record Number: [id number] Patient Account Number: [id number] Date of Birth/Sex: Treating RN: [birth year] (57 y.o. 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 Other chronic osteomyelitis, right ankle and foot E10.621 Type 1 diabetes mellitus with foot ulcer Facility Procedures CPT4 Code Description Modifier Quantity [id number] G0277-(Facility Use Only) HBOT full body chamber, , 4 ICD-10 Diagnosis Description E10.621 Type 1 diabetes mellitus with foot ulcer 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

## 2022-08-16 ENCOUNTER — Telehealth: Payer: Self-pay | Admitting: Podiatry

## 2022-08-16 ENCOUNTER — Encounter (HOSPITAL_BASED_OUTPATIENT_CLINIC_OR_DEPARTMENT_OTHER): Payer: BC Managed Care – PPO | Admitting: Internal Medicine

## 2022-08-16 ENCOUNTER — Other Ambulatory Visit: Payer: Self-pay | Admitting: Podiatry

## 2022-08-16 DIAGNOSIS — E10621 Type 1 diabetes mellitus with foot ulcer: Secondary | ICD-10-CM | POA: Diagnosis not present

## 2022-08-16 DIAGNOSIS — L97514 Non-pressure chronic ulcer of other part of right foot with necrosis of bone: Secondary | ICD-10-CM

## 2022-08-16 DIAGNOSIS — M86671 Other chronic osteomyelitis, right ankle and foot: Secondary | ICD-10-CM | POA: Diagnosis not present

## 2022-08-16 LAB — GLUCOSE, CAPILLARY
Glucose-Capillary: 300 mg/dL — ABNORMAL HIGH (ref 70–99)
Glucose-Capillary: 69 mg/dL — ABNORMAL LOW (ref 70–99)
Glucose-Capillary: 95 mg/dL (ref 70–99)

## 2022-08-16 MED ORDER — CIPROFLOXACIN HCL 500 MG PO TABS: 500.0000 mg | ORAL_TABLET | Freq: Two times a day (BID) | ORAL | 0 refills | Status: DC

## 2022-08-16 MED ORDER — DOXYCYCLINE HYCLATE 100 MG PO TABS: 100.0000 mg | ORAL_TABLET | Freq: Two times a day (BID) | ORAL | 0 refills | Status: DC

## 2022-08-16 NOTE — Progress Notes (Addendum)
[name] [name], [name] [name] ([id number]) [id number] Page 1 of 2 Visit Report for 08/16/2022 HBO Details Patient Name: Date of Service: [name], [name] 08/16/2022 10:00 A M Medical Record Number: [id number] Patient Account Number: [id number] Date of Birth/Sex: Treating RN: 05/31/1965 (57 y.o. [name] [name] Primary Care Teren Franckowiak: [name] Other Clinician: Haywood Pao Referring Cyrena Kuchenbecker: Treating Westyn Keatley/Extender: [name], [name]: 5 HBO Treatment Course Details Treatment Course Number: 2 Ordering Brittanie Dosanjh: [name] T Treatments Ordered: otal 40 HBO Treatment Start Date: 07/18/2022 HBO Indication: Diabetic Ulcer(s) of the Lower Extremity Standard/Conservative Wound Care tried and failed greater than or equal to 30 days Wound #4 Right Calcaneus HBO Treatment Details Treatment Number: 19 Patient Type: Outpatient Chamber Type: Monoplace Chamber Serial #: [id number] Treatment Protocol: 2.0 ATA with 90 minutes oxygen, with two 5 minute air breaks Treatment Details Compression Rate Down: 1.5 psi / minute De-Compression Rate Up: 2.0 psi / minute A breaks and breathing ir Compress Tx Pressure periods Decompress Decompress Begins Reached (leave unused spaces Begins Ends blank) Chamber Pressure (ATA 1 2 2 2 2 2  --2 1 ) Clock Time (24 hr) 10:17 10:27 10:57 11:02 11:32 11:37 - - 12:07 12:15 Treatment Length: 118 (minutes) Treatment Segments: 4 Vital Signs Capillary Blood Glucose Reference Range: 80 - 120 mg / dl HBO Diabetic Blood Glucose Intervention Range: <131 mg/dl or >308 mg/dl Time Capillary Blood Glucose Pulse Blood Respiratory Action Type: Vitals Pulse: Temperature: Glucose Oximetry Pressure: Rate: Meter #: Taken: Taken: (mg/dl): (%) Pre 65:78 469/62 74 18 97.4 300 2 informed physician given 8 oz Glucerna, 2 x 4 oz OJ, PB [place] 12:25 153/86 72 18 97.5 69 2 [place] 12:59 95 1  Physician informed. Treatment Response Treatment Toleration: Well Treatment Completion Status: Treatment Completed without Adverse Event Treatment Notes Mrs. Haubner arrived with normal vital signs. Blood Glucose Level was 300 mg/dL Dr. Mikey Bussing was informed. Patient advised to follow up with endocrinologist. Safety check was completed. A water bottle was emptied and filled with 8 oz apple juice to go with patient for safety. Patient was placed in the chamber which was compressed at a rate of 2 psi/min after confirming normal ear equalization. She tolerated treatment and decompression at 2 psi/min. She did consume half of the apple juice prior to chamber exit. Post-treatment glucose was 69 mg/dL. Other post-treatment vital signs were within range. She was given 8 oz total apple juice outside the chamber, 8 oz Glucerna, 2 containers of peanut butter, and four graham crackers. Blood Glucose Level was checked again at 1259 with a result of 95 mg/dL. Dr. Mikey Bussing was informed. The patient was stable upon discharge with her driver. Additional Procedure Documentation Tissue Sevierity: Necrosis of bone Physician HBO Attestation: I certify that I supervised this HBO treatment in accordance with Medicare guidelines. A trained emergency response team is readily available per Yes hospital policies and procedures. Continue HBOT as ordered. [address] [name] [name], [name] [name] ([id number]) [id number] Page 2 of 2 Electronic Signature(s) Signed: 08/16/2022 3:45:24 PM By: [name] DO Previous Signature: 08/16/2022 2:10:34 PM Version By: Haywood Pao CHT EMT BS , , Entered By: [name] on 08/16/2022 15:42:44 -------------------------------------------------------------------------------- HBO Safety Checklist Details Patient Name: Date of Service: [name] Milo D. 08/16/2022 10:00 A M Medical Record Number: [id number] Patient Account Number: [id number] Date of Birth/Sex: Treating  RN: 1965/12/21 (57 y.o. [name] [name] Primary Care Mettie Roylance: [name] Other Clinician: Haywood Pao Referring Demetria Iwai: Treating Jaquell Seddon/Extender: Kathrynn Humble  L, [name]: 5 HBO Safety Checklist Items Safety Checklist Consent Form Signed Patient voided / foley secured and emptied When did you last eato 0700 Toast, PB, Fruit Last dose of injectable or oral agent 0700 7 units humalog Ostomy pouch emptied and vented if applicable NA All implantable devices assessed, documented and approved NA Intravenous access site secured and place NA Valuables secured Linens and cotton and cotton/polyester blend (less than 51% polyester) Personal oil-based products / skin lotions / body lotions removed Wigs or hairpieces removed NA Smoking or tobacco materials removed NA Books / newspapers / magazines / loose paper removed Cologne, aftershave, perfume and deodorant removed Jewelry removed (may wrap wedding band) Make-up removed Hair care products removed Human Hair extensions approved Libre 3 Approved, otherwise all electronics Battery operated devices (external) removed removed. Heating patches and chemical warmers removed Titanium eyewear removed Nail polish cured greater than 10 hours greater than 10 hours Casting material cured greater than 10 hours NA Hearing aids removed NA Loose dentures or partials removed NA Prosthetics have been removed NA Patient demonstrates correct use of air break device (if applicable) Patient concerns have been addressed Patient grounding bracelet on and cord attached to chamber Specifics for Inpatients (complete in addition to above) Medication sheet sent with patient NA Intravenous medications needed or due during therapy sent with patient NA Drainage tubes (e.g. nasogastric tube or chest tube secured and vented) NA Endotracheal or Tracheotomy tube secured NA Cuff deflated of air and inflated  with saline NA Airway suctioned NA Notes Paper version used prior to treatment start. Electronic Signature(s) Signed: 08/16/2022 1:58:37 PM By: Haywood Pao CHT EMT BS , , Entered By: Haywood Pao on 08/16/2022 13:58:37

## 2022-08-16 NOTE — Progress Notes (Signed)
[name] [name], [name] [name] ([id number]) [id number] Page 1 of 1 Visit Report for 08/16/2022 SuperBill Details Patient Name: Date of Service: [name] [name], [name] [name] 08/16/2022 Medical Record Number: [id number] Patient Account Number: [id number] Date of Birth/Sex: Treating RN: 1965-03-29 (57 y.o. 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 Other chronic osteomyelitis, right ankle and foot E10.621 Type 1 diabetes mellitus with foot ulcer Facility Procedures CPT4 Code Description Modifier Quantity [id number] G0277-(Facility Use Only) HBOT full body chamber, , 4 ICD-10 Diagnosis Description E10.621 Type 1 diabetes mellitus with foot ulcer 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

## 2022-08-16 NOTE — Progress Notes (Addendum)
DELLANIRA, ZOELLNER ([id number]) [id number] Page 1 of 2 Visit Report for 08/16/2022 Arrival Information Details Patient Name: Date of Service: [name] [name], [name] [name] 08/16/2022 10:00 A M Medical Record Number: [id number] Patient Account Number: [id number] Date of Birth/Sex: Treating RN: Apr 14, 1965 (57 y.o. [name] Primary Care Chonda Baney: [name] Other Clinician: Karl Bales Referring Bravery Ketcham: Treating Alianny Toelle/Extender: [name], [name]: 5 Visit Information History Since Last Visit All ordered tests and consults were completed: Yes Patient Arrived: Wheel Chair Added or deleted any medications: No Arrival Time: 09:28 Any new allergies or adverse reactions: No Accompanied By: self Had a fall or experienced change in No Transfer Assistance: None activities of daily living that may affect Patient Identification Verified: Yes risk of falls: Secondary Verification Process Completed: Yes Signs or symptoms of abuse/neglect since last visito No Patient Requires Transmission-Based Precautions: No Hospitalized since last visit: No Patient Has Alerts: No Implantable device outside of the clinic excluding No cellular tissue based products placed in the center since last visit: Pain Present Now: No Electronic Signature(s) Signed: 08/16/2022 1:56:56 PM By: Haywood Pao CHT EMT BS , , Previous Signature: 08/16/2022 1:55:58 PM Version By: Haywood Pao CHT EMT BS , , Entered By: Haywood Pao on 08/16/2022 13:56:56 -------------------------------------------------------------------------------- Encounter Discharge Information Details Patient Name: Date of Service: [name] Milo D. 08/16/2022 10:00 A M Medical Record Number: [id number] Patient Account Number: [id number] Date of Birth/Sex: Treating RN: 04-Feb-1966 (57 y.o. [name] Primary Care Mescal Flinchbaugh: [name] Other Clinician:  Haywood Pao Referring Kalecia Hartney: Treating Nevaeh Casillas/Extender: Caleen Essex GESH Weeks in Treatment: 5 Encounter Discharge Information Items Discharge Condition: Stable Ambulatory Status: Ambulatory Discharge Destination: Home Transportation: Private Auto Accompanied By: driver Schedule Follow-up Appointment: No Clinical Summary of Care: Electronic Signature(s) Signed: 08/16/2022 3:09:49 PM By: Haywood Pao CHT EMT BS , , Entered By: Haywood Pao on 08/16/2022 15:09:49 [name] ([id number]) [phone].pdf Page 2 of 2 -------------------------------------------------------------------------------- Vitals Details Patient Name: Date of Service: [name] [name], [name] [name] 08/16/2022 10:00 A M Medical Record Number: [id number] Patient Account Number: [id number] Date of Birth/Sex: Treating RN: 06-03-1965 (57 y.o. [name] Primary Care Shawnique Mariotti: [name] Other Clinician: Karl Bales Referring Rayane Gallardo: Treating Sinan Tuch/Extender: [name], [name]: 5 Vital Signs Time Taken: 10:12 Temperature (F): 97.4 Height (in): 65 Pulse (bpm): 74 Weight (lbs): 105 Respiratory Rate (breaths/min): 18 Body Mass Index (BMI): 17.5 Blood Pressure (mmHg): 113/72 Capillary Blood Glucose (mg/dl): 433 Reference Range: 80 - 120 mg / dl Electronic Signature(s) Signed: 08/16/2022 1:57:03 PM By: Haywood Pao CHT EMT BS , , Previous Signature: 08/16/2022 1:56:25 PM Version By: Haywood Pao CHT EMT BS , , Entered By: Haywood Pao on 08/16/2022 13:57:03

## 2022-08-16 NOTE — Telephone Encounter (Signed)
Needs a refill on prescription Ciprofloxacin 500mg  and Doxycycline 100mg s. Stated that you told her that you would prescribe due [place] and could not be reached if you could reach out to her about prescription refill

## 2022-08-17 ENCOUNTER — Encounter (HOSPITAL_BASED_OUTPATIENT_CLINIC_OR_DEPARTMENT_OTHER): Payer: BC Managed Care – PPO | Admitting: Physician Assistant

## 2022-08-17 DIAGNOSIS — E10621 Type 1 diabetes mellitus with foot ulcer: Secondary | ICD-10-CM | POA: Diagnosis not present

## 2022-08-17 LAB — GLUCOSE, CAPILLARY
Glucose-Capillary: 221 mg/dL — ABNORMAL HIGH (ref 70–99)
Glucose-Capillary: 201 mg/dL — ABNORMAL HIGH (ref 70–99)

## 2022-08-17 NOTE — Progress Notes (Signed)
[name], [name] ([id number]) [id number] Page 1 of 2 Visit Report for 08/17/2022 Arrival Information Details Patient Name: Date of Service: [name] [name], [name] [name] 08/17/2022 10:00 A M Medical Record Number: [id number] Patient Account Number: [id number] Date of Birth/Sex: Treating RN: [birth year] (57 y.o. [name] [name], [name] Primary Care Abhishek Levesque: [name] Other Clinician: Karl Bales Referring Zylie Mumaw: Treating Shaquanna Lycan/Extender: Alford Highland, [name]: 5 Visit Information History Since Last Visit All ordered tests and consults were completed: Yes Patient Arrived: Wheel Chair Added or deleted any medications: No Arrival Time: 09:28 Any new allergies or adverse reactions: No Accompanied By: Friend Had a fall or experienced change in No Transfer Assistance: None activities of daily living that may affect Patient Identification Verified: Yes risk of falls: Secondary Verification Process Completed: Yes Signs or symptoms of abuse/neglect since last visito No Patient Requires Transmission-Based Precautions: No Hospitalized since last visit: No Patient Has Alerts: No Implantable device outside of the clinic excluding No cellular tissue based products placed in the center since last visit: Pain Present Now: No Electronic Signature(s) Signed: 08/17/2022 2:17:18 PM By: Karl Bales EMT Entered By: Karl Bales on 08/17/2022 14:17:18 -------------------------------------------------------------------------------- Encounter Discharge Information Details Patient Name: Date of Service: [name] Milo D. 08/17/2022 10:00 A M Medical Record Number: [id number] Patient Account Number: [id number] Date of Birth/Sex: Treating RN: February 09, 1966 (57 y.o. [name] Primary Care Pardeep Pautz: [name] Other Clinician: Karl Bales Referring Britnay Magnussen: Treating Cherryl Babin/Extender: Alford Highland, YO GESH Weeks in  Treatment: 5 Encounter Discharge Information Items Discharge Condition: Stable Ambulatory Status: Wheelchair Discharge Destination: Home Transportation: Private Auto Accompanied By: Clearence Cheek Schedule Follow-up Appointment: Yes Clinical Summary of Care: Electronic Signature(s) Signed: 08/17/2022 2:22:25 PM By: Karl Bales EMT Entered By: Karl Bales on 08/17/2022 14:22:25 [name] ([id number]) [id number] Page 2 of 2 -------------------------------------------------------------------------------- Vitals Details Patient Name: Date of Service: [name], [name] 08/17/2022 10:00 A M Medical Record Number: [id number] Patient Account Number: [id number] Date of Birth/Sex: Treating RN: 12/13/1965 (57 y.o. [name] Primary Care Jamillah Camilo: [name] Other Clinician: Karl Bales Referring Journey Ratterman: Treating Carle Fenech/Extender: Alford Highland, [name]: 5 Vital Signs Time Taken: 09:39 Temperature (F): 97.7 Height (in): 65 Pulse (bpm): 77 Weight (lbs): 105 Respiratory Rate (breaths/min): 18 Body Mass Index (BMI): 17.5 Blood Pressure (mmHg): 129/78 Capillary Blood Glucose (mg/dl): 956 Reference Range: 80 - 120 mg / dl Electronic Signature(s) Signed: 08/17/2022 2:18:05 PM By: Karl Bales EMT Entered By: Karl Bales on 08/17/2022 14:18:04

## 2022-08-17 NOTE — Progress Notes (Addendum)
LANDRI, DORSAINVIL ([id number]) [id number] Page 1 of 2 Visit Report for 08/17/2022 HBO Details Patient Name: Date of Service: KASHMERE, DAYWALT 08/17/2022 10:00 A M Medical Record Number: [id number] Patient Account Number: [id number] Date of Birth/Sex: Treating RN: 14-Oct-1965 (57 y.o. [name], [name] Primary Care Caleah Tortorelli: [name] Other Clinician: Karl Bales Referring Elissia Spiewak: Treating Aiven Kampe/Extender: Alford Highland, [name]: 5 HBO Treatment Course Details Treatment Course Number: 2 Ordering Floyce Bujak: [name] T Treatments Ordered: otal 40 HBO Treatment Start Date: 07/18/2022 HBO Indication: Diabetic Ulcer(s) of the Lower Extremity Standard/Conservative Wound Care tried and failed greater than or equal to 30 days Wound #4 Right Calcaneus HBO Treatment Details Treatment Number: 20 Patient Type: Outpatient Chamber Type: Monoplace Chamber Serial #: [id number] Treatment Protocol: 2.0 ATA with 90 minutes oxygen, with two 5 minute air breaks Treatment Details Compression Rate Down: 2.0 psi / minute De-Compression Rate Up: 2.0 psi / minute A breaks and breathing ir Compress Tx Pressure periods Decompress Decompress Begins Reached (leave unused spaces Begins Ends blank) Chamber Pressure (ATA 1 2 2 2 2 2  --2 1 ) Clock Time (24 hr) 10:36 10:44 11:14 11:19 11:49 11:54 - - 12:24 12:31 Treatment Length: 115 (minutes) Treatment Segments: 4 Vital Signs Capillary Blood Glucose Reference Range: 80 - 120 mg / dl HBO Diabetic Blood Glucose Intervention Range: <131 mg/dl or >102 mg/dl Time Vitals Blood Respiratory Capillary Blood Glucose Pulse Action Type: Pulse: Temperature: Taken: Pressure: Rate: Glucose (mg/dl): Meter #: Oximetry (%) Taken: Pre 09:39 129/78 77 18 97.7 221 Post 12:33 147/82 77 18 97.9 201 Treatment Response Treatment Toleration: Well Treatment Completion Status: Treatment Completed without  Adverse Event Additional Procedure Documentation Tissue Sevierity: Necrosis of bone Electronic Signature(s) Signed: 08/17/2022 2:21:12 PM By: Karl Bales EMT Signed: 08/18/2022 5:52:49 PM By: Allen Derry PA-C Entered By: Karl Bales on 08/17/2022 14:21:12 HBO Safety Checklist Details -------------------------------------------------------------------------------- [name] ([id number]) [id number] Page 2 of 2 Patient Name: Date of Service: FERRIS, TALLY 08/17/2022 10:00 A M Medical Record Number: [id number] Patient Account Number: [id number] Date of Birth/Sex: Treating RN: 1965/05/06 (57 y.o. [name] Primary Care Dionta Larke: [name] Other Clinician: Karl Bales Referring Norissa Bartee: Treating Dalores Weger/Extender: Alford Highland, [name]: 5 HBO Safety Checklist Items Safety Checklist Consent Form Signed Patient voided / foley secured and emptied When did you last eato 0700 Last dose of injectable or oral agent 0700 Ostomy pouch emptied and vented if applicable NA All implantable devices assessed, documented and approved NA Intravenous access site secured and place NA Valuables secured Linens and cotton and cotton/polyester blend (less than 51% polyester) Personal oil-based products / skin lotions / body lotions removed Wigs or hairpieces removed NA Smoking or tobacco materials removed Books / newspapers / magazines / loose paper removed Cologne, aftershave, perfume and deodorant removed Jewelry removed (may wrap wedding band) Make-up removed NA Hair care products removed Battery operated devices (external) removed Libre3 Heating patches and chemical warmers removed Titanium eyewear removed NA Nail polish cured greater than 10 hours NA Casting material cured greater than 10 hours NA Hearing aids removed NA Loose dentures or partials removed NA Prosthetics have been removed NA Patient  demonstrates correct use of air break device (if applicable) Patient concerns have been addressed Patient grounding bracelet on and cord attached to chamber Specifics for Inpatients (complete in addition to above) Medication sheet sent with patient NA Intravenous medications needed or due during  therapy sent with patient NA Drainage tubes (e.g. nasogastric tube or chest tube secured and vented) NA Endotracheal or Tracheotomy tube secured NA Cuff deflated of air and inflated with saline NA Airway suctioned NA Notes The safety checklist was done before the treatment was started. Electronic Signature(s) Signed: 08/17/2022 2:19:48 PM By: Karl Bales EMT Entered By: Karl Bales on 08/17/2022 14:19:48

## 2022-08-18 ENCOUNTER — Encounter (HOSPITAL_BASED_OUTPATIENT_CLINIC_OR_DEPARTMENT_OTHER): Payer: BC Managed Care – PPO | Admitting: Internal Medicine

## 2022-08-18 DIAGNOSIS — E10621 Type 1 diabetes mellitus with foot ulcer: Secondary | ICD-10-CM | POA: Diagnosis not present

## 2022-08-18 DIAGNOSIS — M86671 Other chronic osteomyelitis, right ankle and foot: Secondary | ICD-10-CM

## 2022-08-18 DIAGNOSIS — L97514 Non-pressure chronic ulcer of other part of right foot with necrosis of bone: Secondary | ICD-10-CM

## 2022-08-18 LAB — GLUCOSE, CAPILLARY
Glucose-Capillary: 170 mg/dL — ABNORMAL HIGH (ref 70–99)
Glucose-Capillary: 171 mg/dL — ABNORMAL HIGH (ref 70–99)

## 2022-08-18 NOTE — Progress Notes (Signed)
[name] [name], [name] [name] ([id number]) [id number] Page 1 of 2 Visit Report for 08/18/2022 Problem List Details Patient Name: Date of Service: [name] [name], [name] [name] 08/18/2022 10:00 A M Medical Record Number: [id number] Patient Account Number: [id number] Date of Birth/Sex: Treating RN: 1965-08-28 (57 y.o. 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 M86.671 Other chronic osteomyelitis, right ankle and foot 07/12/2022 No Yes E10.621 Type 1 diabetes mellitus with foot ulcer 07/12/2022 No Yes Inactive Problems Resolved Problems Electronic Signature(s) Signed: 08/18/2022 1:17:59 PM By: Karl Bales EMT Signed: 08/18/2022 4:07:01 PM By: [name] DO Entered By: Karl Bales on 08/18/2022 13:17:58 -------------------------------------------------------------------------------- SuperBill Details Patient Name: Date of Service: [name] [name] 08/18/2022 Medical Record Number: [id number] Patient Account Number: [id number] Date of Birth/Sex: Treating RN: Mar 17, 1965 (57 y.o. [name], [name] Primary Care Provider: [name] Other Clinician: Karl Bales Referring Provider: Treating Provider/Extender: [name], [name]: 5 Diagnosis Coding ICD-10 Codes Code Description [phone] Non-pressure chronic ulcer of other part of right foot with necrosis of bone M86.671 Other chronic osteomyelitis, right ankle and foot E10.621 Type 1 diabetes mellitus with foot ulcer Facility Procedures [name] [name], [name] [name] ([id number]): CPT4 Code Description Modifier [id number]-(Facility Use Only) HBOT full body chamber, ,  ICD-10 Diagnosis Description E10.621 Type 1 diabetes mellitus with foot ulcer L97.514 Non-pressure chronic ulcer of other part  of right foot with necrosis o M86.671 Other chronic osteomyelitis, right ankle and foot [id number] Page 2 of 2: Quantity 4 f bone Physician Procedures : CPT4 Code Description Modifier [phone] - WC PHYS HYPERBARIC OXYGEN THERAPY ICD-10 Diagnosis Description E10.621 Type 1 diabetes mellitus with foot ulcer 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

## 2022-08-18 NOTE — Progress Notes (Addendum)
[name], [name] ([id number]) [id number] Page 1 of 2 Visit Report for 08/18/2022 HBO Details Patient Name: Date of Service: [name] [name], [name] [name] 08/18/2022 10:00 A M Medical Record Number: [id number] Patient Account Number: [id number] Date of Birth/Sex: Treating RN: Nov 15, 1965 (57 y.o. [name] [name], [name] [name] Primary Care Bradin Mcadory: [name] Other Clinician: Karl Bales Referring [name]: Treating [name] [name]/Extender: [name] [name], [name]: 5 HBO Treatment Course Details Treatment Course Number: 2 Ordering Joleah Kosak: [name] T Treatments Ordered: otal 40 HBO Treatment Start Date: 07/18/2022 HBO Indication: Diabetic Ulcer(s) of the Lower Extremity Standard/Conservative Wound Care tried and failed greater than or equal to 30 days Wound #4 Right Calcaneus HBO Treatment Details Treatment Number: 21 Patient Type: Outpatient Chamber Type: Monoplace Chamber Serial #: [id number] Treatment Protocol: 2.0 ATA with 90 minutes oxygen, with two 5 minute air breaks Treatment Details Compression Rate Down: 1.5 psi / minute De-Compression Rate Up: 2.0 psi / minute A breaks and breathing ir Compress Tx Pressure periods Decompress Decompress Begins Reached (leave unused spaces Begins Ends blank) Chamber Pressure (ATA 1 2 2 2 2 2  --2 1 ) Clock Time (24 hr) 10:44 10:55 11:25 11:30 12:00 12:05 - - 12:35 12:43 Treatment Length: 119 (minutes) Treatment Segments: 4 Vital Signs Capillary Blood Glucose Reference Range: 80 - 120 mg / dl HBO Diabetic Blood Glucose Intervention Range: <131 mg/dl or >332 mg/dl Time Vitals Blood Respiratory Capillary Blood Glucose Pulse Action Type: Pulse: Temperature: Taken: Pressure: Rate: Glucose (mg/dl): Meter #: Oximetry (%) Taken: Pre 09:53 106/57 71 18 97.2 170 Post 12:48 146/87 74 18 97.1 171 Treatment Response Treatment Toleration: Well Treatment Completion Status: Treatment Completed without  Adverse Event Additional Procedure Documentation Tissue Sevierity: Necrosis of bone Physician HBO Attestation: I certify that I supervised this HBO treatment in accordance with Medicare guidelines. A trained emergency response team is readily available per Yes hospital policies and procedures. Continue HBOT as ordered. Yes Electronic Signature(s) Signed: 08/19/2022 1:00:24 PM By: [name] DO Previous Signature: 08/18/2022 1:17:12 PM Version By: Karl Bales EMT Previous Signature: 08/18/2022 4:07:01 PM Version By: [name] DO Entered By: [name] on 08/19/2022 12:59:00 [name] ([id number]) [id number] Page 2 of 2 -------------------------------------------------------------------------------- HBO Safety Checklist Details Patient Name: Date of Service: [name], [name] 08/18/2022 10:00 A M Medical Record Number: [id number] Patient Account Number: [id number] Date of Birth/Sex: Treating RN: Jan 27, 1966 (57 y.o. F) [name], [name] [name] Primary Care Mattheu Brodersen: [name] Other Clinician: Karl Bales Referring Nickayla Mcinnis: Treating [name]/Extender: [name] [name], [name]: 5 HBO Safety Checklist Items Safety Checklist Consent Form Signed Patient voided / foley secured and emptied When did you last eato 0730 Last dose of injectable or oral agent 0730 Ostomy pouch emptied and vented if applicable NA All implantable devices assessed, documented and approved NA Intravenous access site secured and place NA Valuables secured Linens and cotton and cotton/polyester blend (less than 51% polyester) Personal oil-based products / skin lotions / body lotions removed Wigs or hairpieces removed Human Extensions approved Smoking or tobacco materials removed Books / newspapers / magazines / loose paper removed Cologne, aftershave, perfume and deodorant removed Jewelry removed (may wrap wedding band) Make-up  removed Hair care products removed Battery operated devices (external) removed Libre3 Heating patches and chemical warmers removed Titanium eyewear removed NA Nail polish cured greater than 10 hours NA Casting material cured greater than 10 hours NA Hearing aids removed NA Loose dentures or partials removed NA Prosthetics have been  removed NA Patient demonstrates correct use of air break device (if applicable) Patient concerns have been addressed Patient grounding bracelet on and cord attached to chamber Specifics for Inpatients (complete in addition to above) Medication sheet sent with patient NA Intravenous medications needed or due during therapy sent with patient NA Drainage tubes (e.g. nasogastric tube or chest tube secured and vented) NA Endotracheal or Tracheotomy tube secured NA Cuff deflated of air and inflated with saline NA Airway suctioned NA Electronic Signature(s) Signed: 08/18/2022 1:15:45 PM By: Karl Bales EMT Entered By: Karl Bales on 08/18/2022 13:15:45

## 2022-08-18 NOTE — Progress Notes (Signed)
SHAREEN, KINSEL ([id number]) [id number] Page 1 of 2 Visit Report for 08/18/2022 Arrival Information Details Patient Name: Date of Service: [name] [name], [name] [name] 08/18/2022 10:00 A M Medical Record Number: [id number] Patient Account Number: [id number] Date of Birth/Sex: Treating RN: Jul 27, 1965 (57 y.o. [name], [name] Primary Care Edrie Ehrich: [name] Other Clinician: Karl Bales Referring Bushra Denman: Treating Sinclair Alligood/Extender: [name], [name]: 5 Visit Information History Since Last Visit All ordered tests and consults were completed: Yes Patient Arrived: Wheel Chair Added or deleted any medications: No Arrival Time: 09:41 Any new allergies or adverse reactions: No Accompanied By: Husband Had a fall or experienced change in No Transfer Assistance: None activities of daily living that may affect Patient Identification Verified: Yes risk of falls: Secondary Verification Process Completed: Yes Signs or symptoms of abuse/neglect since last visito No Patient Requires Transmission-Based Precautions: No Hospitalized since last visit: No Patient Has Alerts: No Implantable device outside of the clinic excluding No cellular tissue based products placed in the center since last visit: Pain Present Now: No Electronic Signature(s) Signed: 08/18/2022 1:13:51 PM By: Karl Bales EMT Entered By: Karl Bales on 08/18/2022 13:13:50 -------------------------------------------------------------------------------- Encounter Discharge Information Details Patient Name: Date of Service: [name] Milo D. 08/18/2022 10:00 A M Medical Record Number: [id number] Patient Account Number: [id number] Date of Birth/Sex: Treating RN: 1966-02-22 (57 y.o. [name], [name] Primary Care Kaleen Rochette: [name] Other Clinician: Karl Bales Referring Nolyn Swab: Treating Amado Andal/Extender: Caleen Essex GESH Weeks in  Treatment: 5 Encounter Discharge Information Items Discharge Condition: Stable Ambulatory Status: Wheelchair Discharge Destination: Home Transportation: Private Auto Accompanied By: Clearence Cheek Schedule Follow-up Appointment: Yes Clinical Summary of Care: Electronic Signature(s) Signed: 08/18/2022 1:18:41 PM By: Karl Bales EMT Entered By: Karl Bales on 08/18/2022 13:18:40 [name] ([id number]) [id number] Page 2 of 2 -------------------------------------------------------------------------------- Vitals Details Patient Name: Date of Service: [name] [name], [name] [name] 08/18/2022 10:00 A M Medical Record Number: [id number] Patient Account Number: [id number] Date of Birth/Sex: Treating RN: Oct 23, 1965 (57 y.o. [name], [name] Primary Care Jennilee Demarco: [name] Other Clinician: Karl Bales Referring Carrie Schoonmaker: Treating Joslynne Klatt/Extender: [name], [name]: 5 Vital Signs Time Taken: 09:53 Temperature (F): 97.2 Height (in): 65 Pulse (bpm): 71 Weight (lbs): 105 Respiratory Rate (breaths/min): 18 Body Mass Index (BMI): 17.5 Blood Pressure (mmHg): 106/57 Capillary Blood Glucose (mg/dl): 244 Reference Range: 80 - 120 mg / dl Electronic Signature(s) Signed: 08/18/2022 1:14:41 PM By: Karl Bales EMT Entered By: Karl Bales on 08/18/2022 13:14:41

## 2022-08-19 ENCOUNTER — Encounter (HOSPITAL_BASED_OUTPATIENT_CLINIC_OR_DEPARTMENT_OTHER): Payer: BC Managed Care – PPO | Admitting: Internal Medicine

## 2022-08-19 ENCOUNTER — Ambulatory Visit (HOSPITAL_BASED_OUTPATIENT_CLINIC_OR_DEPARTMENT_OTHER): Payer: BC Managed Care – PPO | Admitting: Internal Medicine

## 2022-08-19 DIAGNOSIS — L97514 Non-pressure chronic ulcer of other part of right foot with necrosis of bone: Secondary | ICD-10-CM

## 2022-08-19 DIAGNOSIS — M86671 Other chronic osteomyelitis, right ankle and foot: Secondary | ICD-10-CM | POA: Diagnosis not present

## 2022-08-19 DIAGNOSIS — E10621 Type 1 diabetes mellitus with foot ulcer: Secondary | ICD-10-CM | POA: Diagnosis not present

## 2022-08-19 LAB — GLUCOSE, CAPILLARY
Glucose-Capillary: 212 mg/dL — ABNORMAL HIGH (ref 70–99)
Glucose-Capillary: 235 mg/dL — ABNORMAL HIGH (ref 70–99)
Glucose-Capillary: 149 mg/dL — ABNORMAL HIGH (ref 70–99)

## 2022-08-19 NOTE — Progress Notes (Signed)
KHAYA, THEISSEN ([id number]) [id number] Page 1 of 2 Visit Report for 08/19/2022 HBO Details Patient Name: Date of Service: [name] [name], [name] [name] 08/19/2022 8:00 A M Medical Record Number: [id number] Patient Account Number: [id number] Date of Birth/Sex: Treating RN: March 03, 1965 (57 y.o. [name], [name] Primary Care Lakitha Gordy: [name] Other Clinician: Karl Bales Referring Nydia Ytuarte: Treating Avryl Roehm/Extender: [name], [name]: 5 HBO Treatment Course Details Treatment Course Number: 2 Ordering Mylene Bow: [name] T Treatments Ordered: otal 40 HBO Treatment Start Date: 07/18/2022 HBO Indication: Diabetic Ulcer(s) of the Lower Extremity Standard/Conservative Wound Care tried and failed greater than or equal to 30 days Wound #4 Right Calcaneus HBO Treatment Details Treatment Number: 22 Patient Type: Outpatient Chamber Type: Monoplace Chamber Serial #: [id number] Treatment Protocol: 2.0 ATA with 90 minutes oxygen, with two 5 minute air breaks Treatment Details Compression Rate Down: 2.0 psi / minute De-Compression Rate Up: 2.0 psi / minute A breaks and breathing ir Compress Tx Pressure periods Decompress Decompress Begins Reached (leave unused spaces Begins Ends blank) Chamber Pressure (ATA 1 2 2 2 2 2  --2 1 ) Clock Time (24 hr) 08:08 08:16 08:46 08:51 09:21 09:26 - - 09:56 10:03 Treatment Length: 115 (minutes) Treatment Segments: 4 Vital Signs Capillary Blood Glucose Reference Range: 80 - 120 mg / dl HBO Diabetic Blood Glucose Intervention Range: <131 mg/dl or >952 mg/dl Time Vitals Blood Respiratory Capillary Blood Glucose Pulse Action Type: Pulse: Temperature: Taken: Pressure: Rate: Glucose (mg/dl): Meter #: Oximetry (%) Taken: Pre 07:39 103/60 72 18 97.3 235 Post 10:08 152/82 68 18 97.2 149 Treatment Response Treatment Toleration: Well Treatment Completion Status: Treatment Completed without  Adverse Event Additional Procedure Documentation Tissue Sevierity: Necrosis of bone Physician HBO Attestation: I certify that I supervised this HBO treatment in accordance with Medicare guidelines. A trained emergency response team is readily available per Yes hospital policies and procedures. Continue HBOT as ordered. Yes Electronic Signature(s) Signed: 08/22/2022 4:29:34 PM By: [name] DO Previous Signature: 08/19/2022 1:22:27 PM Version By: Karl Bales EMT Entered By: [name] on 08/22/2022 16:14:48 [name] ([id number]) [id number] Page 2 of 2 -------------------------------------------------------------------------------- HBO Safety Checklist Details Patient Name: Date of Service: [name] [name], [name] [name] 08/19/2022 8:00 A M Medical Record Number: [id number] Patient Account Number: [id number] Date of Birth/Sex: Treating RN: 07-15-1965 (57 y.o. [name], [name] Primary Care Finnlee Silvernail: [name] Other Clinician: Karl Bales Referring Augusta Mirkin: Treating Jenella Craigie/Extender: [name], [name]: 5 HBO Safety Checklist Items Safety Checklist Consent Form Signed Patient voided / foley secured and emptied When did you last eato 0745 Last dose of injectable or oral agent 0730 Ostomy pouch emptied and vented if applicable NA All implantable devices assessed, documented and approved NA Intravenous access site secured and place NA Valuables secured Linens and cotton and cotton/polyester blend (less than 51% polyester) Personal oil-based products / skin lotions / body lotions removed Wigs or hairpieces removed Human Extensions approved Smoking or tobacco materials removed Books / newspapers / magazines / loose paper removed Cologne, aftershave, perfume and deodorant removed Jewelry removed (may wrap wedding band) Make-up removed Hair care products removed Battery operated devices (external)  removed Heating patches and chemical warmers removed Titanium eyewear removed NA Nail polish cured greater than 10 hours over 60 weeks old Casting material cured greater than 10 hours NA Hearing aids removed NA Loose dentures or partials removed NA Prosthetics have been removed NA Patient demonstrates correct use of air  break device (if applicable) Patient concerns have been addressed Patient grounding bracelet on and cord attached to chamber Specifics for Inpatients (complete in addition to above) Medication sheet sent with patient NA Intravenous medications needed or due during therapy sent with patient NA Drainage tubes (e.g. nasogastric tube or chest tube secured and vented) NA Endotracheal or Tracheotomy tube secured NA Cuff deflated of air and inflated with saline NA Airway suctioned NA Notes The safety checklist was done before the treatment was started. Electronic Signature(s) Signed: 08/19/2022 1:21:04 PM By: Karl Bales EMT Entered By: Karl Bales on 08/19/2022 13:21:03

## 2022-08-19 NOTE — Progress Notes (Signed)
KEONDA, DOW ([id number]) [id number] Page 1 of 2 Visit Report for 08/17/2022 Problem List Details Patient Name: Date of Service: [name] [name], [name] [name] 08/17/2022 10:00 A M Medical Record Number: [id number] Patient Account Number: [id number] Date of Birth/Sex: Treating RN: Jun 05, 1965 (57 y.o. 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 M86.671 Other chronic osteomyelitis, right ankle and foot 07/12/2022 No Yes E10.621 Type 1 diabetes mellitus with foot ulcer 07/12/2022 No Yes Inactive Problems Resolved Problems Electronic Signature(s) Signed: 08/17/2022 2:21:45 PM By: Karl Bales EMT Signed: 08/18/2022 5:52:49 PM By: Allen Derry PA-C Entered By: Karl Bales on 08/17/2022 14:21:45 -------------------------------------------------------------------------------- SuperBill Details Patient Name: Date of Service: [name] [name] 08/17/2022 Medical Record Number: [id number] Patient Account Number: [id number] Date of Birth/Sex: Treating RN: 1965-08-11 (57 y.o. 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 Other chronic osteomyelitis, right ankle and foot E10.621 Type 1 diabetes mellitus with foot ulcer Facility Procedures [name], [name] ([id number]): CPT4 Code Description Modifier [id number] G0277-(Facility Use Only) HBOT full body chamber, ,  ICD-10 Diagnosis Description E10.621 Type 1 diabetes mellitus with foot ulcer L97.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-pressure chronic ulcer of other part  of right foot with necrosis o 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 Other chronic osteomyelitis, right ankle and foot [id number] Page 2 of 2: Quantity 4 f bone Physician Procedures : CPT4 Code Description Modifier [phone] - WC PHYS HYPERBARIC OXYGEN THERAPY ICD-10 Diagnosis Description E10.621 Type 1 diabetes mellitus with foot ulcer 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

## 2022-08-19 NOTE — Progress Notes (Signed)
[name] [name] ([id number]) [id number] Page 1 of 2 Visit Report for 08/19/2022 Arrival Information Details Patient Name: Date of Service: [name] [name], [name] [name] 08/19/2022 8:00 A M Medical Record Number: [id number] Patient Account Number: [id number] Date of Birth/Sex: Treating RN: 15-Jan-1966 (57 y.o. [name] [name], [name] [name] Primary Care [name] [name]: [name] Other Clinician: Karl Bales Referring Mackinzee Roszak: Treating [name] [name]/Extender: [name], [name]: 5 Visit Information History Since Last Visit All ordered tests and consults were completed: Yes Patient Arrived: Wheel Chair Added or deleted any medications: No Arrival Time: 07:33 Any new allergies or adverse reactions: No Accompanied By: Husband Had a fall or experienced change in No Transfer Assistance: None activities of daily living that may affect Patient Identification Verified: Yes risk of falls: Secondary Verification Process Completed: Yes Signs or symptoms of abuse/neglect since last visito No Patient Requires Transmission-Based Precautions: No Hospitalized since last visit: No Patient Has Alerts: No Implantable device outside of the clinic excluding No cellular tissue based products placed in the center since last visit: Pain Present Now: No Electronic Signature(s) Signed: 08/19/2022 1:18:14 PM By: Karl Bales EMT Entered By: Karl Bales on 08/19/2022 13:18:13 -------------------------------------------------------------------------------- Encounter Discharge Information Details Patient Name: Date of Service: [name] Milo D. 08/19/2022 8:00 A M Medical Record Number: [id number] Patient Account Number: [id number] Date of Birth/Sex: Treating RN: 12-06-1965 (57 y.o. [name] [name], [name] [name] Primary Care [name] [name]: [name] Other Clinician: Karl Bales Referring [name] [name]: Treating [name] [name]/Extender: [name], YO GESH Weeks in  Treatment: 5 Encounter Discharge Information Items Discharge Condition: Stable Ambulatory Status: Wheelchair Discharge Destination: Home Transportation: Private Auto Accompanied By: [name] [name] Schedule Follow-up Appointment: Yes Clinical Summary of Care: Electronic Signature(s) Signed: 08/19/2022 1:23:37 PM By: Karl Bales EMT Entered By: Karl Bales on 08/19/2022 13:23:36 [name] ([id number]) [id number] Page 2 of 2 -------------------------------------------------------------------------------- Vitals Details Patient Name: Date of Service: [name] [name] 08/19/2022 8:00 A M Medical Record Number: [id number] Patient Account Number: [id number] Date of Birth/Sex: Treating RN: 1965/04/21 (58 y.o. [name] [name], [name] [name] Primary Care Clint Biello: [name] Other Clinician: Karl Bales Referring Mattheus Rauls: Treating [name] [name]/Extender: [name], [name]: 5 Vital Signs Time Taken: 07:39 Temperature (F): 97.3 Height (in): 65 Pulse (bpm): 72 Weight (lbs): 105 Respiratory Rate (breaths/min): 18 Body Mass Index (BMI): 17.5 Blood Pressure (mmHg): 103/60 Capillary Blood Glucose (mg/dl): 244 Reference Range: 80 - 120 mg / dl Electronic Signature(s) Signed: 08/19/2022 1:19:03 PM By: Karl Bales EMT Entered By: Karl Bales on 08/19/2022 13:19:02

## 2022-08-22 ENCOUNTER — Encounter (HOSPITAL_BASED_OUTPATIENT_CLINIC_OR_DEPARTMENT_OTHER): Payer: BC Managed Care – PPO | Admitting: Internal Medicine

## 2022-08-22 ENCOUNTER — Ambulatory Visit (HOSPITAL_BASED_OUTPATIENT_CLINIC_OR_DEPARTMENT_OTHER): Payer: BC Managed Care – PPO | Admitting: Internal Medicine

## 2022-08-22 DIAGNOSIS — E10621 Type 1 diabetes mellitus with foot ulcer: Secondary | ICD-10-CM | POA: Diagnosis not present

## 2022-08-22 DIAGNOSIS — L97514 Non-pressure chronic ulcer of other part of right foot with necrosis of bone: Secondary | ICD-10-CM | POA: Diagnosis not present

## 2022-08-22 DIAGNOSIS — M86671 Other chronic osteomyelitis, right ankle and foot: Secondary | ICD-10-CM

## 2022-08-22 LAB — GLUCOSE, CAPILLARY
Glucose-Capillary: 153 mg/dL — ABNORMAL HIGH (ref 70–99)
Glucose-Capillary: 130 mg/dL — ABNORMAL HIGH (ref 70–99)

## 2022-08-22 NOTE — Progress Notes (Signed)
[name] [name], [name] [name] ([id number]) [id number] Page 1 of 2 Visit Report for 08/22/2022 Arrival Information Details Patient Name: Date of Service: [name] [name], [name] [name] 08/22/2022 8:00 A M Medical Record Number: [id number] Patient Account Number: [id number] Date of Birth/Sex: Treating RN: 02-23-1966 (57 y.o. [name], [name] Primary Care Tarynn Garling: [name] Other Clinician: Karl Bales Referring Tamorah Hada: Treating Janelle Culton/Extender: [name], [name]: 5 Visit Information History Since Last Visit All ordered tests and consults were completed: Yes Patient Arrived: Wheel Chair Added or deleted any medications: No Arrival Time: 09:32 Any new allergies or adverse reactions: No Accompanied By: Husband Had a fall or experienced change in No Transfer Assistance: None activities of daily living that may affect Patient Identification Verified: Yes risk of falls: Secondary Verification Process Completed: Yes Signs or symptoms of abuse/neglect since last visito No Patient Requires Transmission-Based Precautions: No Hospitalized since last visit: No Patient Has Alerts: No Implantable device outside of the clinic excluding No cellular tissue based products placed in the center since last visit: Pain Present Now: No Electronic Signature(s) Signed: 08/22/2022 2:04:09 PM By: Karl Bales EMT Entered By: Karl Bales on 08/22/2022 14:04:09 -------------------------------------------------------------------------------- Encounter Discharge Information Details Patient Name: Date of Service: [name] Milo D. 08/22/2022 8:00 A M Medical Record Number: [id number] Patient Account Number: [id number] Date of Birth/Sex: Treating RN: [birth year] (57 y.o. [name], [name] Primary Care Desani Sprung: [name] Other Clinician: Karl Bales Referring Gergory Biello: Treating Hermelinda Diegel/Extender: [name], YO GESH Weeks in  Treatment: 5 Encounter Discharge Information Items Discharge Condition: Stable Ambulatory Status: Wheelchair Discharge Destination: Home Transportation: Private Auto Accompanied By: Husband Schedule Follow-up Appointment: Yes Clinical Summary of Care: Electronic Signature(s) Signed: 08/22/2022 2:11:09 PM By: Karl Bales EMT Entered By: Karl Bales on 08/22/2022 14:11:09 [name] [name] ([id number]) [id number] Page 2 of 2 -------------------------------------------------------------------------------- Vitals Details Patient Name: Date of Service: [name] [name], [name] [name] 08/22/2022 8:00 A M Medical Record Number: [id number] Patient Account Number: [id number] Date of Birth/Sex: Treating RN: 11-08-1965 (57 y.o. [name], [name] Primary Care Teejay Meader: [name] Other Clinician: Karl Bales Referring [name] Vanderwerf: Treating Niah Heinle/Extender: [name], [name]: 5 Vital Signs Time Taken: 07:52 Temperature (F): 97.2 Height (in): 65 Pulse (bpm): 73 Weight (lbs): 105 Respiratory Rate (breaths/min): 18 Body Mass Index (BMI): 17.5 Blood Pressure (mmHg): 113/72 Capillary Blood Glucose (mg/dl): 433 Reference Range: 80 - 120 mg / dl Electronic Signature(s) Signed: 08/22/2022 2:04:43 PM By: Karl Bales EMT Entered By: Karl Bales on 08/22/2022 14:04:42

## 2022-08-22 NOTE — Progress Notes (Signed)
[name], [name] ([id number]) [id number] Page 1 of 2 Visit Report for 08/19/2022 Problem List Details Patient Name: Date of Service: [name] [name], [name] [name] 08/19/2022 8:00 A M Medical Record Number: [id number] Patient Account Number: [id number] Date of Birth/Sex: Treating RN: 1965-09-14 (57 y.o. 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 Other chronic osteomyelitis, right ankle and foot E10.621 Type 1 diabetes mellitus with foot ulcer Facility Procedures [name], [name] ([id number]): CPT4 Code Description Modifier [id number]-(Facility Use Only) HBOT full body chamber, ,  ICD-10 Diagnosis Description E10.621 Type 1 diabetes mellitus with foot ulcer L97.514 Non-pressure chronic ulcer of other part  of right foot with necrosis o M86.671 Other chronic osteomyelitis, right ankle and foot [id number] Page 2 of 2: Quantity 4 f bone Physician Procedures : CPT4 Code Description Modifier [id number] [phone] - WC PHYS HYPERBARIC OXYGEN THERAPY ICD-10 Diagnosis Description E10.621 Type 1 diabetes mellitus with foot ulcer 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

## 2022-08-22 NOTE — Progress Notes (Signed)
IBTISAM, BENGE ([id number]) [id number] Page 1 of 2 Visit Report for 08/22/2022 Problem List Details Patient Name: Date of Service: [name] [name], [name] [name] 08/22/2022 8:00 A M Medical Record Number: [id number] Patient Account Number: [id number] Date of Birth/Sex: Treating RN: December 30, 1965 (57 y.o. 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 Other chronic osteomyelitis, right ankle and foot 07/12/2022 No Yes E10.621 Type 1 diabetes mellitus with foot ulcer 07/12/2022 No Yes Inactive Problems Resolved Problems Electronic Signature(s) Signed: 08/22/2022 2:10:20 PM By: Karl Bales EMT Signed: 08/22/2022 4:29:34 PM By: [name] DO Entered By: Karl Bales on 08/22/2022 14:10:20 -------------------------------------------------------------------------------- SuperBill Details Patient Name: Date of Service: [name] 08/22/2022 Medical Record Number: [id number] Patient Account Number: [id number] Date of Birth/Sex: Treating RN: 1965/11/22 (57 y.o. 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 Other chronic osteomyelitis, right ankle and foot E10.621 Type 1 diabetes mellitus with foot ulcer Facility Procedures [name], [name] ([id number]): CPT4 Code Description Modifier [id number] G0277-(Facility Use Only) HBOT full body chamber, ,  ICD-10 Diagnosis Description E10.621 Type 1 diabetes mellitus with foot ulcer L97.514 Non-pressure chronic ulcer of other part  of right foot with necrosis o M86.671 Other chronic osteomyelitis, right ankle and foot [id number] Page 2 of 2: Quantity 4 f bone Physician Procedures : CPT4 Code Description Modifier [phone] - WC PHYS HYPERBARIC OXYGEN THERAPY ICD-10 Diagnosis Description E10.621 Type 1 diabetes mellitus with foot ulcer 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

## 2022-08-22 NOTE — Progress Notes (Addendum)
NAKITA, SANTERRE ([id number]) [id number] Page 1 of 2 Visit Report for 08/22/2022 HBO Details Patient Name: Date of Service: [name] [name], [name] [name] 08/22/2022 8:00 A M Medical Record Number: [id number] Patient Account Number: [id number] Date of Birth/Sex: Treating RN: [birth year] (57 y.o. [name], [name] Primary Care Albin Duckett: [name] Other Clinician: Karl Bales Referring Elide Stalzer: Treating Khyleigh Furney/Extender: [name], [name]: 5 HBO Treatment Course Details Treatment Course Number: 2 Ordering Alecxander Mainwaring: [name] T Treatments Ordered: otal 40 HBO Treatment Start Date: 07/18/2022 HBO Indication: Diabetic Ulcer(s) of the Lower Extremity Standard/Conservative Wound Care tried and failed greater than or equal to 30 days Wound #4 Right Calcaneus HBO Treatment Details Treatment Number: 23 Patient Type: Outpatient Chamber Type: Monoplace Chamber Serial #: [id number] Treatment Protocol: 2.0 ATA with 90 minutes oxygen, with two 5 minute air breaks Treatment Details Compression Rate Down: 2.0 psi / minute De-Compression Rate Up: 2.0 psi / minute A breaks and breathing ir Compress Tx Pressure periods Decompress Decompress Begins Reached (leave unused spaces Begins Ends blank) Chamber Pressure (ATA 1 2 2 2 2 2  --2 1 ) Clock Time (24 hr) 08:13 08:20 08:50 08:55 09:25 09:30 - - 10:00 10:08 Treatment Length: 115 (minutes) Treatment Segments: 4 Vital Signs Capillary Blood Glucose Reference Range: 80 - 120 mg / dl HBO Diabetic Blood Glucose Intervention Range: <131 mg/dl or >308 mg/dl Time Vitals Blood Respiratory Capillary Blood Glucose Pulse Action Type: Pulse: Temperature: Taken: Pressure: Rate: Glucose (mg/dl): Meter #: Oximetry (%) Taken: Pre 07:52 113/72 73 18 97.2 153 Post 10:14 145/83 75 18 97.5 130 Treatment Response Treatment Toleration: Well Treatment Completion Status: Treatment Completed without  Adverse Event Additional Procedure Documentation Tissue Sevierity: Necrosis of bone Physician HBO Attestation: I certify that I supervised this HBO treatment in accordance with Medicare guidelines. A trained emergency response team is readily available per Yes hospital policies and procedures. Continue HBOT as ordered. Yes Electronic Signature(s) Signed: 08/22/2022 4:31:37 PM By: [name] DO Previous Signature: 08/22/2022 2:08:12 PM Version By: Karl Bales EMT Previous Signature: 08/22/2022 4:29:34 PM Version By: [name] DO Entered By: [name] on 08/22/2022 16:30:06 [name] ([id number]) [id number] Page 2 of 2 -------------------------------------------------------------------------------- HBO Safety Checklist Details Patient Name: Date of Service: TAIMANE, STIMMEL 08/22/2022 8:00 A M Medical Record Number: [id number] Patient Account Number: [id number] Date of Birth/Sex: Treating RN: Nov 29, 1965 (57 y.o. [name], [name] Primary Care Jonerik Sliker: [name] Other Clinician: Karl Bales Referring Sinai Mahany: Treating Brenee Gajda/Extender: [name], [name]: 5 HBO Safety Checklist Items Safety Checklist Consent Form Signed Patient voided / foley secured and emptied When did you last eato 0630 Last dose of injectable or oral agent 0630 Ostomy pouch emptied and vented if applicable NA All implantable devices assessed, documented and approved NA Intravenous access site secured and place NA Valuables secured Linens and cotton and cotton/polyester blend (less than 51% polyester) Personal oil-based products / skin lotions / body lotions removed Wigs or hairpieces removed Human Extensions approved Smoking or tobacco materials removed Books / newspapers / magazines / loose paper removed Cologne, aftershave, perfume and deodorant removed Jewelry removed (may wrap wedding band) Make-up  removed Hair care products removed Battery operated devices (external) removed Heating patches and chemical warmers removed Titanium eyewear removed Nail polish cured greater than 10 hours over 55 weeks old Casting material cured greater than 10 hours NA Hearing aids removed NA Loose dentures or partials removed NA Prosthetics have  been removed NA Patient demonstrates correct use of air break device (if applicable) Patient concerns have been addressed Patient grounding bracelet on and cord attached to chamber Specifics for Inpatients (complete in addition to above) Medication sheet sent with patient NA Intravenous medications needed or due during therapy sent with patient NA Drainage tubes (e.g. nasogastric tube or chest tube secured and vented) NA Endotracheal or Tracheotomy tube secured NA Cuff deflated of air and inflated with saline NA Airway suctioned NA Notes The safety checklist was done before the treatment was started. Electronic Signature(s) Signed: 08/22/2022 2:06:58 PM By: Karl Bales EMT Entered By: Karl Bales on 08/22/2022 14:06:58

## 2022-08-23 ENCOUNTER — Encounter (HOSPITAL_BASED_OUTPATIENT_CLINIC_OR_DEPARTMENT_OTHER): Payer: BC Managed Care – PPO | Admitting: Internal Medicine

## 2022-08-23 DIAGNOSIS — L97514 Non-pressure chronic ulcer of other part of right foot with necrosis of bone: Secondary | ICD-10-CM | POA: Diagnosis not present

## 2022-08-23 DIAGNOSIS — M86671 Other chronic osteomyelitis, right ankle and foot: Secondary | ICD-10-CM | POA: Diagnosis not present

## 2022-08-23 DIAGNOSIS — E10621 Type 1 diabetes mellitus with foot ulcer: Secondary | ICD-10-CM

## 2022-08-23 LAB — GLUCOSE, CAPILLARY
Glucose-Capillary: 203 mg/dL — ABNORMAL HIGH (ref 70–99)
Glucose-Capillary: 231 mg/dL — ABNORMAL HIGH (ref 70–99)

## 2022-08-23 NOTE — Progress Notes (Addendum)
[name] [name], [name] [name] ([id number]) [id number] Page 1 of 2 Visit Report for 08/23/2022 HBO Details Patient Name: Date of Service: [name], [name] 08/23/2022 10:00 A M Medical Record Number: [id number] Patient Account Number: [id number] Date of Birth/Sex: Treating RN: 1965-08-07 (56 y.o. [name] Primary Care Geroge Gilliam: [name] Other Clinician: Haywood Pao Referring Magdaline Zollars: Treating Neima Lacross/Extender: Caleen Essex GESH Weeks in Treatment: 6 HBO Treatment Course Details Treatment Course Number: 2 Ordering Larrisha Babineau: [name] T Treatments Ordered: otal 40 HBO Treatment Start Date: 07/18/2022 HBO Indication: Diabetic Ulcer(s) of the Lower Extremity Standard/Conservative Wound Care tried and failed greater than or equal to 30 days Wound #4 Right Calcaneus HBO Treatment Details Treatment Number: 24 Patient Type: Outpatient Chamber Type: Monoplace Chamber Serial #: [id number] Treatment Protocol: 2.0 ATA with 90 minutes oxygen, with two 5 minute air breaks Treatment Details Compression Rate Down: 1.5 psi / minute De-Compression Rate Up: 2.0 psi / minute A breaks and breathing ir Compress Tx Pressure periods Decompress Decompress Begins Reached (leave unused spaces Begins Ends blank) Chamber Pressure (ATA 1 2 2 2 2 2  --2 1 ) Clock Time (24 hr) 10:05 10:15 10:45 10:50 11:20 11:25 - - 11:55 12:05 Treatment Length: 120 (minutes) Treatment Segments: 4 Vital Signs Capillary Blood Glucose Reference Range: 80 - 120 mg / dl HBO Diabetic Blood Glucose Intervention Range: <131 mg/dl or >782 mg/dl Type: Time Vitals Blood Respiratory Capillary Blood Glucose Pulse Action Pulse: Temperature: Taken: Pressure: Rate: Glucose (mg/dl): Meter #: Oximetry (%) Taken: Pre 09:37 102/63 75 18 97.5 231 1 none per protocol Post 12:10 156/96 70 18 97.2 146 1 none per protocol Treatment Response Treatment Toleration: Well Treatment  Completion Status: Treatment Completed without Adverse Event Treatment Notes Mrs. Gilham arrived with normal vital signs. Blood Glucose Level was 231 mg/dL. Patient left her [name] 3 reader at home and requested a secondary blood glucose reading. This was 203 mg/dL at 9562. Dr. Mikey Bussing agreed to send in 8 oz of orange juice to use in an emergent situation within the chamber. Safety check was completed. A water bottle was emptied and filled with 8 oz orange juice to go with patient for safety. Patient was placed in the chamber which was compressed with 100% oxygen at a rate of 2 psi/min after confirming normal ear equalization. She tolerated treatment and subsequent decompression at 2 psi/min. Post-treatment glucose was 146 mg/dL. Other post-treatment vital signs were within range. The patient was stable upon discharge with her driver. Additional Procedure Documentation Tissue Sevierity: Necrosis of bone Physician HBO Attestation: I certify that I supervised this HBO treatment in accordance with Medicare guidelines. A trained emergency response team is readily available per Yes hospital policies and procedures. Continue HBOT as ordered. Yes Electronic Signature(s) Signed: 08/23/2022 4:25:00 PM By: Audley Hose, Signed: 08/23/2022 4:25:00 PM By: Sinda Du D ([id number]) [id number] Page 2 of 2 Previous Signature: 08/23/2022 1:48:50 PM Version By: Haywood Pao CHT EMT BS , , Entered By: [name] on 08/23/2022 16:16:15 -------------------------------------------------------------------------------- HBO Safety Checklist Details Patient Name: Date of Service: [name] [name], [name] [name] 08/23/2022 10:00 A M Medical Record Number: [id number] Patient Account Number: [id number] Date of Birth/Sex: Treating RN: 06/04/1965 (57 y.o. [name] Primary Care Darvin Dials: [name] Other Clinician: Haywood Pao Referring  Branden Vine: Treating Tuwanda Vokes/Extender: [name], [name]: 6 HBO Safety Checklist Items Safety Checklist Consent Form Signed Patient voided / foley secured and emptied When did  you last eato 0730-PeanutButterToast, Banana Coffee Last dose of injectable or oral agent 0730 - 5 Units humalog Ostomy pouch emptied and vented if applicable NA All implantable devices assessed, documented and approved NA Intravenous access site secured and place NA Valuables secured Linens and cotton and cotton/polyester blend (less than 51% polyester) Personal oil-based products / skin lotions / body lotions removed Wigs or hairpieces removed NA Smoking or tobacco materials removed NA Books / newspapers / magazines / loose paper removed Cologne, aftershave, perfume and deodorant removed Jewelry removed (may wrap wedding band) Make-up removed Hair care products removed Battery operated devices (external) removed Heating patches and chemical warmers removed Titanium eyewear removed Nail polish cured greater than 10 hours NA Casting material cured greater than 10 hours NA Hearing aids removed NA Loose dentures or partials removed left at home Prosthetics have been removed NA Patient demonstrates correct use of air break device (if applicable) Patient concerns have been addressed Patient grounding bracelet on and cord attached to chamber Specifics for Inpatients (complete in addition to above) Medication sheet sent with patient NA Intravenous medications needed or due during therapy sent with patient NA Drainage tubes (e.g. nasogastric tube or chest tube secured and vented) NA Endotracheal or Tracheotomy tube secured NA Cuff deflated of air and inflated with saline NA Airway suctioned NA Notes Paper version used prior to treatment start. Electronic Signature(s) Signed: 08/23/2022 1:34:56 PM By: Haywood Pao CHT EMT BS , , Entered By: Haywood Pao on 08/23/2022 13:34:56

## 2022-08-23 NOTE — Progress Notes (Signed)
FINLEIGH, CHEONG ([id number]) [id number] Page 1 of 1 Visit Report for 08/23/2022 SuperBill Details Patient Name: Date of Service: [name] [name], [name] [name] 08/23/2022 Medical Record Number: [id number] Patient Account Number: [id number] Date of Birth/Sex: Treating RN: 1965-07-24 (57 y.o. 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 Other chronic osteomyelitis, right ankle and foot E10.621 Type 1 diabetes mellitus with foot ulcer Facility Procedures CPT4 Code Description Modifier Quantity [id number] G0277-(Facility Use Only) HBOT full body chamber, , 4 ICD-10 Diagnosis Description E10.621 Type 1 diabetes mellitus with foot ulcer L97.514 Non-pressure chronic ulcer of other part of right foot with necrosis of bone M86.671 Other chronic osteomyelitis, right ankle and foot Physician Procedures Quantity CPT4 Code Description Modifier [id number] - WC PHYS HYPERBARIC OXYGEN THERAPY 1 ICD-10 Diagnosis Description E10.621 Type 1 diabetes mellitus with foot ulcer L97.514 Non-pressure chronic ulcer of other part of right foot with necrosis of bone M86.671 Other chronic osteomyelitis, right ankle and foot Electronic Signature(s) Signed: 08/23/2022 1:49:27 PM By: Haywood Pao CHT EMT BS , , Signed: 08/23/2022 4:25:00 PM By: [name] DO Entered By: Haywood Pao on 08/23/2022 13:49:26

## 2022-08-23 NOTE — Progress Notes (Addendum)
ELLAH, OTTE ([id number]) [id number] Page 1 of 2 Visit Report for 08/23/2022 Arrival Information Details Patient Name: Date of Service: LINDSIE, SIMAR 08/23/2022 10:00 A M Medical Record Number: [id number] Patient Account Number: [id number] Date of Birth/Sex: Treating RN: 11-Sep-1965 (57 y.o. [name] Primary Care Bluma Buresh: [name] Other Clinician: Haywood Pao Referring Cybill Uriegas: Treating Isabela Nardelli/Extender: [name], [name]: 6 Visit Information History Since Last Visit All ordered tests and consults were completed: Yes Patient Arrived: Wheel Chair Added or deleted any medications: No Arrival Time: 09:29 Any new allergies or adverse reactions: No Accompanied By: self Had a fall or experienced change in No Transfer Assistance: None activities of daily living that may affect Patient Identification Verified: Yes risk of falls: Secondary Verification Process Completed: Yes Signs or symptoms of abuse/neglect since last visito No Patient Requires Transmission-Based Precautions: No Hospitalized since last visit: No Patient Has Alerts: No Implantable device outside of the clinic excluding No cellular tissue based products placed in the center since last visit: Pain Present Now: No Electronic Signature(s) Signed: 08/23/2022 1:31:54 PM By: Haywood Pao CHT EMT BS , , Entered By: Haywood Pao on 08/23/2022 13:31:54 -------------------------------------------------------------------------------- Encounter Discharge Information Details Patient Name: Date of Service: [name] 08/23/2022 10:00 A M Medical Record Number: [id number] Patient Account Number: [id number] Date of Birth/Sex: Treating RN: [birth year] (57 y.o. [name] Primary Care Rad Gramling: [name] Other Clinician: Haywood Pao Referring Bary Limbach: Treating Dewana Ammirati/Extender: Caleen Essex GESH Weeks in Treatment: 6 Encounter Discharge Information Items Discharge Condition: Stable Ambulatory Status: Wheelchair Discharge Destination: Home Transportation: Private Auto Accompanied By: driver Schedule Follow-up Appointment: No Clinical Summary of Care: Electronic Signature(s) Signed: 08/23/2022 1:50:11 PM By: Haywood Pao CHT EMT BS , , Entered By: Haywood Pao on 08/23/2022 13:50:11 [name] ([id number]) [id number] Page 2 of 2 -------------------------------------------------------------------------------- Vitals Details Patient Name: Date of Service: ZELENE, BARGA 08/23/2022 10:00 A M Medical Record Number: [id number] Patient Account Number: [id number] Date of Birth/Sex: Treating RN: 03-31-1965 (57 y.o. [name] Primary Care Stevee Valenta: [name] Other Clinician: Haywood Pao Referring Shion Bluestein: Treating Alane Hanssen/Extender: [name], [name]: 6 Vital Signs Time Taken: 09:37 Temperature (F): 97.5 Height (in): 65 Pulse (bpm): 75 Weight (lbs): 105 Respiratory Rate (breaths/min): 18 Body Mass Index (BMI): 17.5 Blood Pressure (mmHg): 102/63 Capillary Blood Glucose (mg/dl): 536 Reference Range: 80 - 120 mg / dl Electronic Signature(s) Signed: 08/23/2022 1:32:16 PM By: Haywood Pao CHT EMT BS , , Entered By: Haywood Pao on 08/23/2022 13:32:16

## 2022-08-24 ENCOUNTER — Encounter (HOSPITAL_BASED_OUTPATIENT_CLINIC_OR_DEPARTMENT_OTHER): Payer: BC Managed Care – PPO | Admitting: Physician Assistant

## 2022-08-24 DIAGNOSIS — E10621 Type 1 diabetes mellitus with foot ulcer: Secondary | ICD-10-CM | POA: Diagnosis not present

## 2022-08-24 LAB — GLUCOSE, CAPILLARY
Glucose-Capillary: 146 mg/dL — ABNORMAL HIGH (ref 70–99)
Glucose-Capillary: 158 mg/dL — ABNORMAL HIGH (ref 70–99)
Glucose-Capillary: 182 mg/dL — ABNORMAL HIGH (ref 70–99)

## 2022-08-24 NOTE — Progress Notes (Addendum)
LORRY, FURBER ([id number]) [id number] Page 1 of 2 Visit Report for 08/24/2022 HBO Details Patient Name: Date of Service: [name] [name], [name] [name] 08/24/2022 10:00 A M Medical Record Number: [id number] Patient Account Number: [id number] Date of Birth/Sex: Treating RN: 1965/03/16 (57 y.o. [name] [name] Primary Care Alic Hilburn: [name] Other Clinician: Karl Bales Referring Yurani Fettes: Treating Affan Callow/Extender: Alford Highland, [name]: 6 HBO Treatment Course Details Treatment Course Number: 2 Ordering Selda Jalbert: [name] T Treatments Ordered: otal 40 HBO Treatment Start Date: 07/18/2022 HBO Indication: Diabetic Ulcer(s) of the Lower Extremity Standard/Conservative Wound Care tried and failed greater than or equal to 30 days Wound #4 Right Calcaneus HBO Treatment Details Treatment Number: 25 Patient Type: Outpatient Chamber Type: Monoplace Chamber Serial #: [id number] Treatment Protocol: 2.0 ATA with 90 minutes oxygen, with two 5 minute air breaks Treatment Details Compression Rate Down: 2.0 psi / minute De-Compression Rate Up: 2.0 psi / minute A breaks and breathing ir Compress Tx Pressure periods Decompress Decompress Begins Reached (leave unused spaces Begins Ends blank) Chamber Pressure (ATA 1 2 2 2 2 2  --2 1 ) Clock Time (24 hr) 09:56 10:05 10:35 10:40 11:10 11:15 - - 11:45 11:51 Treatment Length: 115 (minutes) Treatment Segments: 4 Vital Signs Capillary Blood Glucose Reference Range: 80 - 120 mg / dl HBO Diabetic Blood Glucose Intervention Range: <131 mg/dl or >694 mg/dl Time Vitals Blood Respiratory Capillary Blood Glucose Pulse Action Type: Pulse: Temperature: Taken: Pressure: Rate: Glucose (mg/dl): Meter #: Oximetry (%) Taken: Pre 09:37 103/64 72 18 97 158 Post 11:56 151/89 75 18 97.2 182 Treatment Response Treatment Toleration: Well Treatment Completion Status: Treatment Completed without  Adverse Event Additional Procedure Documentation Tissue Sevierity: Necrosis of bone Electronic Signature(s) Signed: 08/24/2022 1:25:55 PM By: Karl Bales EMT Signed: 08/24/2022 4:15:09 PM By: Allen Derry PA-C Entered By: Karl Bales on 08/24/2022 13:25:55 HBO Safety Checklist Details -------------------------------------------------------------------------------- [name] ([id number]) [id number] Page 2 of 2 Patient Name: Date of Service: [name], [name] 08/24/2022 10:00 A M Medical Record Number: [id number] Patient Account Number: [id number] Date of Birth/Sex: Treating RN: 12/31/1965 (57 y.o. [name] [name] Primary Care Marialena Wollen: [name] Other Clinician: Karl Bales Referring Ibraheem Voris: Treating Chevez [name]/Extender: Alford Highland, [name]: 6 HBO Safety Checklist Items Safety Checklist Consent Form Signed Patient voided / foley secured and emptied When did you last eato 0730 Last dose of injectable or oral agent 0730 Ostomy pouch emptied and vented if applicable NA All implantable devices assessed, documented and approved NA Intravenous access site secured and place NA Valuables secured Linens and cotton and cotton/polyester blend (less than 51% polyester) Personal oil-based products / skin lotions / body lotions removed Wigs or hairpieces removed Human Extensions approved Smoking or tobacco materials removed Books / newspapers / magazines / loose paper removed Cologne, aftershave, perfume and deodorant removed Jewelry removed (may wrap wedding band) Make-up removed Hair care products removed Battery operated devices (external) removed Heating patches and chemical warmers removed Titanium eyewear removed NA Nail polish cured greater than 10 hours over 29 weeks old Casting material cured greater than 10 hours NA Hearing aids removed NA Loose dentures or partials removed NA Prosthetics have  been removed NA Patient demonstrates correct use of air break device (if applicable) Patient concerns have been addressed Patient grounding bracelet on and cord attached to chamber Specifics for Inpatients (complete in addition to above) Medication sheet sent with patient NA Intravenous medications needed  or due during therapy sent with patient NA Drainage tubes (e.g. nasogastric tube or chest tube secured and vented) NA Endotracheal or Tracheotomy tube secured NA Cuff deflated of air and inflated with saline NA Airway suctioned NA Notes The safety checklist was done before the treatment was started. Electronic Signature(s) Signed: 08/24/2022 1:24:42 PM By: Karl Bales EMT Entered By: Karl Bales on 08/24/2022 13:24:42

## 2022-08-24 NOTE — Progress Notes (Signed)
[name] [name], [name] [name] ([id number]) [id number] Page 1 of 2 Visit Report for 08/24/2022 Arrival Information Details Patient Name: Date of Service: [name] [name], [name] [name] 08/24/2022 10:00 A M Medical Record Number: [id number] Patient Account Number: [id number] Date of Birth/Sex: Treating RN: Jun 06, 1965 (57 y.o. [name] [name] Primary Care [name] [name]: [name] [name] Other Clinician: Karl [name] Referring [name] [name]: Treating [name] [name]/Extender: [name] [name], [name] [name] in Treatment: 6 Visit Information History Since Last Visit All ordered tests and consults were completed: Yes Patient Arrived: Wheel Chair Added or deleted any medications: No Arrival Time: 09:28 Any new allergies or adverse reactions: No Accompanied By: Friend Had a fall or experienced change in No Transfer Assistance: None activities of daily living that may affect Patient Identification Verified: Yes risk of falls: Secondary Verification Process Completed: Yes Signs or symptoms of abuse/neglect since last visito No Patient Requires Transmission-Based Precautions: No Hospitalized since last visit: No Patient Has Alerts: No Implantable device outside of the clinic excluding No cellular tissue based products placed in the center since last visit: Pain Present Now: No Electronic Signature(s) Signed: 08/24/2022 1:22:36 PM By: [name] [name] EMT Entered By: [name] [name] on 08/24/2022 13:22:36 -------------------------------------------------------------------------------- Encounter Discharge Information Details Patient Name: Date of Service: [name] [name] D. 08/24/2022 10:00 A M Medical Record Number: [id number] Patient Account Number: [id number] Date of Birth/Sex: Treating RN: Jul 14, 1965 (58 y.o. [name] [name] Primary Care [name] [name]: [name] [name] Other Clinician: Karl [name] Referring [name] [name]: Treating [name] [name]/Extender: [name] [name], [name] [name] in  Treatment: 6 Encounter Discharge Information Items Discharge Condition: Stable Ambulatory Status: Wheelchair Discharge Destination: Home Transportation: Private Auto Accompanied By: [name] [name] Schedule Follow-up Appointment: Yes Clinical Summary of Care: Electronic Signature(s) Signed: 08/24/2022 1:27:14 PM By: [name] [name] EMT Entered By: [name] [name] on 08/24/2022 13:27:14 [name] [name] ([id number]) [id number] Page 2 of 2 -------------------------------------------------------------------------------- Vitals Details Patient Name: Date of Service: [name] [name], [name] [name] 08/24/2022 10:00 A M Medical Record Number: [id number] Patient Account Number: [id number] Date of Birth/Sex: Treating RN: 01/06/1966 (57 y.o. [name] [name] Primary Care [name] [name]: [name] [name] Other Clinician: Karl [name] Referring [name] [name]: Treating [name] [name]/Extender: [name] [name], [name] [name] in Treatment: 6 Vital Signs Time Taken: 09:37 Temperature (F): 97.0 Height (in): 65 Pulse (bpm): 72 Weight (lbs): 105 Respiratory Rate (breaths/min): 18 Body Mass Index (BMI): 17.5 Blood Pressure (mmHg): 103/64 Capillary Blood Glucose (mg/dl): 160 Reference Range: 80 - 120 mg / dl Electronic Signature(s) Signed: 08/24/2022 1:23:07 PM By: [name] [name] EMT Entered By: [name] [name] on 08/24/2022 13:23:07

## 2022-08-24 NOTE — Progress Notes (Signed)
BLAKELEE, ALLINGTON ([id number]) [id number] Page 1 of 2 Visit Report for 08/24/2022 Problem List Details Patient Name: Date of Service: [name] [name], [name] [name] 08/24/2022 10:00 A M Medical Record Number: [id number] Patient Account Number: [id number] Date of Birth/Sex: Treating RN: 08-Apr-1965 (57 y.o. [name] Primary Care Provider: [name] Other Clinician: Karl Bales Referring Provider: Treating Provider/Extender: Alford Highland, [name]: 6 Active Problems ICD-10 Encounter Code Description Active Date MDM Diagnosis L97.514 Non-pressure chronic ulcer of other part of right foot with 07/12/2022 No Yes necrosis of bone M86.671 Other chronic osteomyelitis, right ankle and foot 07/12/2022 No Yes E10.621 Type 1 diabetes mellitus with foot ulcer 07/12/2022 No Yes Inactive Problems Resolved Problems Electronic Signature(s) Signed: 08/24/2022 1:26:33 PM By: Karl Bales EMT Signed: 08/24/2022 4:15:09 PM By: Allen Derry PA-C Entered By: Karl Bales on 08/24/2022 13:26:33 -------------------------------------------------------------------------------- SuperBill Details Patient Name: Date of Service: [name] [name] 08/24/2022 Medical Record Number: [id number] Patient Account Number: [id number] Date of Birth/Sex: Treating RN: 1966-02-03 (57 y.o. 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 Other chronic osteomyelitis, right ankle and foot E10.621 Type 1 diabetes mellitus with foot ulcer Facility Procedures NIOKA, THORINGTON ([id number]): CPT4 Code Description Modifier [phone] G0277-(Facility Use Only) HBOT full body chamber,  , ICD-10 Diagnosis Description E10.621 Type 1 diabetes mellitus with foot ulcer L97.514 Non-pressure chronic ulcer of other part  of right foot with necrosis o M86.671 Other chronic osteomyelitis, right ankle and foot [id number] Page 2 of 2: Quantity 4 f bone Physician Procedures : CPT4 Code Description Modifier [phone] - WC PHYS HYPERBARIC OXYGEN THERAPY ICD-10 Diagnosis Description E10.621 Type 1 diabetes mellitus with foot ulcer L97.514 Non-pressure chronic ulcer of other part of right foot with necrosis o M86.671 Other  chronic osteomyelitis, right ankle and foot Quantity: 1 f bone Electronic Signature(s) Signed: 08/24/2022 1:26:22 PM By: Karl Bales EMT Signed: 08/24/2022 4:15:09 PM By: Allen Derry PA-C Entered By: Karl Bales on 08/24/2022 13:26:22

## 2022-08-25 ENCOUNTER — Encounter (HOSPITAL_BASED_OUTPATIENT_CLINIC_OR_DEPARTMENT_OTHER): Payer: BC Managed Care – PPO | Admitting: Internal Medicine

## 2022-08-25 DIAGNOSIS — M86671 Other chronic osteomyelitis, right ankle and foot: Secondary | ICD-10-CM | POA: Diagnosis not present

## 2022-08-25 DIAGNOSIS — E10621 Type 1 diabetes mellitus with foot ulcer: Secondary | ICD-10-CM

## 2022-08-25 DIAGNOSIS — L97514 Non-pressure chronic ulcer of other part of right foot with necrosis of bone: Secondary | ICD-10-CM | POA: Diagnosis not present

## 2022-08-25 LAB — GLUCOSE, CAPILLARY
Glucose-Capillary: 241 mg/dL — ABNORMAL HIGH (ref 70–99)
Glucose-Capillary: 272 mg/dL — ABNORMAL HIGH (ref 70–99)
Glucose-Capillary: 206 mg/dL — ABNORMAL HIGH (ref 70–99)

## 2022-08-25 NOTE — Progress Notes (Signed)
[name] [name], [name] [name] ([id number]) [id number] Page 1 of 2 Visit Report for 08/25/2022 Problem List Details Patient Name: Date of Service: [name], [name] 08/25/2022 10:00 A M Medical Record Number: [id number] Patient Account Number: [id number] Date of Birth/Sex: Treating RN: March 30, 1965 (57 y.o. 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 M86.671 Other chronic osteomyelitis, right ankle and foot 07/12/2022 No Yes E10.621 Type 1 diabetes mellitus with foot ulcer 07/12/2022 No Yes Inactive Problems Resolved Problems Electronic Signature(s) Signed: 08/25/2022 2:15:15 PM By: Karl Bales EMT Signed: 08/25/2022 4:14:51 PM By: [name] DO Entered By: Karl Bales on 08/25/2022 14:15:14 -------------------------------------------------------------------------------- SuperBill Details Patient Name: Date of Service: [name] 08/25/2022 Medical Record Number: [id number] Patient Account Number: [id number] Date of Birth/Sex: Treating RN: 1965/08/02 (57 y.o. 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 Other chronic osteomyelitis, right ankle and foot E10.621 Type 1 diabetes mellitus with foot ulcer Facility Procedures [name], [name] ([id number]): CPT4 Code Description Modifier [id number] G0277-(Facility Use Only) HBOT full body chamber,  , ICD-10 Diagnosis Description E10.621 Type 1 diabetes mellitus with foot ulcer L97.514 Non-pressure chronic ulcer of other part  of right foot with necrosis o M86.671 Other chronic osteomyelitis, right ankle and foot [id number] Page 2 of 2: Quantity 4 f bone Physician Procedures : CPT4 Code Description Modifier [phone] - WC PHYS HYPERBARIC OXYGEN THERAPY ICD-10 Diagnosis Description E10.621 Type 1 diabetes mellitus with foot ulcer L97.514 Non-pressure chronic ulcer of other part of right foot with necrosis o M86.671 Other  chronic osteomyelitis, right ankle and foot Quantity: 1 f bone Electronic Signature(s) Signed: 08/25/2022 2:15:08 PM By: Karl Bales EMT Signed: 08/25/2022 4:14:51 PM By: [name] DO Entered By: Karl Bales on 08/25/2022 14:15:08

## 2022-08-25 NOTE — Progress Notes (Addendum)
[name] [name] ([id number]) [id number] Page 1 of 2 Visit Report for 08/25/2022 HBO Details Patient Name: Date of Service: [name] [name] 08/25/2022 10:00 A M Medical Record Number: [id number] Patient Account Number: [id number] Date of Birth/Sex: Treating RN: 12-Feb-1966 (57 y.o. [name] [name] Primary Care Marranda Arakelian: [name] Other Clinician: Karl Bales Referring Zamiyah Resendes: Treating Edyth Glomb/Extender: [name], [name]: 6 HBO Treatment Course Details Treatment Course Number: 2 Ordering Becci Batty: [name] T Treatments Ordered: otal 40 HBO Treatment Start Date: 07/18/2022 HBO Indication: Diabetic Ulcer(s) of the Lower Extremity Standard/Conservative Wound Care tried and failed greater than or equal to 30 days Wound #4 Right Calcaneus HBO Treatment Details Treatment Number: 26 Patient Type: Outpatient Chamber Type: Monoplace Chamber Serial #: [id number] Treatment Protocol: 2.0 ATA with 90 minutes oxygen, with two 5 minute air breaks Treatment Details Compression Rate Down: 2.0 psi / minute De-Compression Rate Up: 2.0 psi / minute A breaks and breathing ir Compress Tx Pressure periods Decompress Decompress Begins Reached (leave unused spaces Begins Ends blank) Chamber Pressure (ATA 1 2 2 2 2 2  --2 1 ) Clock Time (24 hr) 10:36 10:40 11:10 11:15 11:45 11:50 - - 12:20 12:29 Treatment Length: 113 (minutes) Treatment Segments: 4 Vital Signs Capillary Blood Glucose Reference Range: 80 - 120 mg / dl HBO Diabetic Blood Glucose Intervention Range: <131 mg/dl or >962 mg/dl Time Vitals Blood Respiratory Capillary Blood Glucose Pulse Action Type: Pulse: Temperature: Taken: Pressure: Rate: Glucose (mg/dl): Meter #: Oximetry (%) Taken: Pre 09:37 120/75 80 18 97.1 272 Post 12:34 148/88 76 18 97.2 206 Pre 10:23 241 Treatment Response Treatment Toleration: Well Treatment Completion Status: Treatment  Completed without Adverse Event Additional Procedure Documentation Tissue Sevierity: Necrosis of bone Physician HBO Attestation: I certify that I supervised this HBO treatment in accordance with Medicare guidelines. A trained emergency response team is readily available per Yes hospital policies and procedures. Continue HBOT as ordered. Yes Electronic Signature(s) Signed: 08/25/2022 4:14:51 PM By: [name] DO Previous Signature: 08/25/2022 2:14:48 PM Version By: Karl Bales EMT Entered By: [name] on 08/25/2022 16:13:05 [name] ([id number]) [id number] Page 2 of 2 -------------------------------------------------------------------------------- HBO Safety Checklist Details Patient Name: Date of Service: [name] [name] 08/25/2022 10:00 A M Medical Record Number: [id number] Patient Account Number: [id number] Date of Birth/Sex: Treating RN: Jul 29, 1965 (57 y.o. [name] [name] Primary Care Riese Hellard: [name] Other Clinician: Karl Bales Referring Ashlye Oviedo: Treating Remell Giaimo/Extender: [name], [name]: 6 HBO Safety Checklist Items Safety Checklist Consent Form Signed Patient voided / foley secured and emptied When did you last eato 0800 Last dose of injectable or oral agent 0800 Ostomy pouch emptied and vented if applicable NA All implantable devices assessed, documented and approved NA Intravenous access site secured and place NA Valuables secured Linens and cotton and cotton/polyester blend (less than 51% polyester) Personal oil-based products / skin lotions / body lotions removed Wigs or hairpieces removed Human Extensions approved Smoking or tobacco materials removed Books / newspapers / magazines / loose paper removed Cologne, aftershave, perfume and deodorant removed Jewelry removed (may wrap wedding band) Make-up removed Hair care products removed Battery operated devices  (external) removed Libre3 Heating patches and chemical warmers removed Titanium eyewear removed NA Nail polish cured greater than 10 hours over 97 weeks old Casting material cured greater than 10 hours NA Hearing aids removed NA Loose dentures or partials removed NA Prosthetics have been removed NA Patient demonstrates  correct use of air break device (if applicable) Patient concerns have been addressed Patient grounding bracelet on and cord attached to chamber Specifics for Inpatients (complete in addition to above) Medication sheet sent with patient NA Intravenous medications needed or due during therapy sent with patient NA Drainage tubes (e.g. nasogastric tube or chest tube secured and vented) NA Endotracheal or Tracheotomy tube secured NA Cuff deflated of air and inflated with saline NA Airway suctioned NA Notes The safety checklist was done before the treatment was started. Electronic Signature(s) Signed: 08/25/2022 2:15:38 PM By: Karl Bales EMT Previous Signature: 08/25/2022 2:11:58 PM Version By: Karl Bales EMT Entered By: Karl Bales on 08/25/2022 14:15:37

## 2022-08-25 NOTE — Progress Notes (Addendum)
AVENLY, ROBERGE ([id number]) [id number] Page 1 of 2 Visit Report for 08/25/2022 Arrival Information Details Patient Name: Date of Service: [name] [name], [name] [name] 08/25/2022 10:00 A M Medical Record Number: [id number] Patient Account Number: [id number] Date of Birth/Sex: Treating RN: 1965-06-24 (57 y.o. Katrinka Blazing Primary Care Jaris Kohles: [name] Other Clinician: Karl Bales Referring Elfreda Blanchet: Treating Asa Fath/Extender: [name], [name]: 6 Visit Information History Since Last Visit All ordered tests and consults were completed: Yes Patient Arrived: Wheel Chair Added or deleted any medications: No Arrival Time: 09:29 Any new allergies or adverse reactions: No Accompanied By: Friend Had a fall or experienced change in No Transfer Assistance: None activities of daily living that may affect Patient Identification Verified: Yes risk of falls: Secondary Verification Process Completed: Yes Signs or symptoms of abuse/neglect since last visito No Patient Requires Transmission-Based Precautions: No Hospitalized since last visit: No Patient Has Alerts: No Implantable device outside of the clinic excluding No cellular tissue based products placed in the center since last visit: Pain Present Now: No Electronic Signature(s) Signed: 08/25/2022 2:09:24 PM By: Karl Bales EMT Entered By: Karl Bales on 08/25/2022 14:09:24 -------------------------------------------------------------------------------- Encounter Discharge Information Details Patient Name: Date of Service: [name] Milo D. 08/25/2022 10:00 A M Medical Record Number: [id number] Patient Account Number: [id number] Date of Birth/Sex: Treating RN: [birth year] (57 y.o. Katrinka Blazing Primary Care Christipher Rieger: [name] Other Clinician: Karl Bales Referring Athaliah Baumbach: Treating Raynee Mccasland/Extender: Caleen Essex GESH Weeks in  Treatment: 6 Encounter Discharge Information Items Discharge Condition: Stable Ambulatory Status: Wheelchair Discharge Destination: Home Transportation: Private Auto Accompanied By: Clearence Cheek Schedule Follow-up Appointment: Yes Clinical Summary of Care: Electronic Signature(s) Signed: 08/25/2022 2:22:23 PM By: Karl Bales EMT Entered By: Karl Bales on 08/25/2022 14:22:23 [name] [name] ([id number]) [id number] Page 2 of 2 -------------------------------------------------------------------------------- Vitals Details Patient Name: Date of Service: [name] [name], [name] [name] 08/25/2022 10:00 A M Medical Record Number: [id number] Patient Account Number: [id number] Date of Birth/Sex: Treating RN: 1965-03-14 (57 y.o. Katrinka Blazing Primary Care Tzvi Economou: [name] Other Clinician: Karl Bales Referring Tache Bobst: Treating Seana Underwood/Extender: [name], [name]: 6 Vital Signs Time Taken: 09:37 Temperature (F): 97.1 Height (in): 65 Pulse (bpm): 80 Weight (lbs): 105 Respiratory Rate (breaths/min): 18 Body Mass Index (BMI): 17.5 Blood Pressure (mmHg): 120/75 Capillary Blood Glucose (mg/dl): 244 Reference Range: 80 - 120 mg / dl Electronic Signature(s) Signed: 08/25/2022 2:31:45 PM By: Karl Bales EMT Previous Signature: 08/25/2022 2:10:38 PM Version By: Karl Bales EMT Entered By: Karl Bales on 08/25/2022 14:12:35

## 2022-08-26 ENCOUNTER — Encounter (HOSPITAL_BASED_OUTPATIENT_CLINIC_OR_DEPARTMENT_OTHER): Payer: BC Managed Care – PPO | Admitting: Internal Medicine

## 2022-08-26 ENCOUNTER — Ambulatory Visit (HOSPITAL_BASED_OUTPATIENT_CLINIC_OR_DEPARTMENT_OTHER): Payer: BC Managed Care – PPO | Admitting: Internal Medicine

## 2022-08-26 DIAGNOSIS — E10621 Type 1 diabetes mellitus with foot ulcer: Secondary | ICD-10-CM

## 2022-08-26 DIAGNOSIS — L97514 Non-pressure chronic ulcer of other part of right foot with necrosis of bone: Secondary | ICD-10-CM | POA: Diagnosis not present

## 2022-08-26 DIAGNOSIS — M86671 Other chronic osteomyelitis, right ankle and foot: Secondary | ICD-10-CM | POA: Diagnosis not present

## 2022-08-26 LAB — GLUCOSE, CAPILLARY
Glucose-Capillary: 207 mg/dL — ABNORMAL HIGH (ref 70–99)
Glucose-Capillary: 127 mg/dL — ABNORMAL HIGH (ref 70–99)

## 2022-08-26 NOTE — Progress Notes (Signed)
BILINDA, CUBBERLEY ([id number]) [id number] Page 1 of 2 Visit Report for 08/26/2022 Arrival Information Details Patient Name: Date of Service: [name] [name], [name] [name] 08/26/2022 8:00 A M Medical Record Number: [id number] Patient Account Number: [id number] Date of Birth/Sex: Treating RN: 14-Sep-1965 (57 y.o. [name] Primary Care Latrisa Hellums: [name] Other Clinician: Haywood Pao Referring Delonta Yohannes: Treating Kanchan Gal/Extender: [name], [name]: 6 Visit Information History Since Last Visit All ordered tests and consults were completed: Yes Patient Arrived: Wheel Chair Added or deleted any medications: No Arrival Time: 07:34 Any new allergies or adverse reactions: No Accompanied By: spouse Had a fall or experienced change in No Transfer Assistance: None activities of daily living that may affect Patient Identification Verified: Yes risk of falls: Secondary Verification Process Completed: Yes Signs or symptoms of abuse/neglect since last visito No Patient Requires Transmission-Based Precautions: No Hospitalized since last visit: No Patient Has Alerts: No Implantable device outside of the clinic excluding No cellular tissue based products placed in the center since last visit: Pain Present Now: No Electronic Signature(s) Signed: 08/26/2022 12:34:05 PM By: Haywood Pao CHT EMT BS , , Entered By: Haywood Pao on 08/26/2022 12:34:05 -------------------------------------------------------------------------------- Encounter Discharge Information Details Patient Name: Date of Service: [name] Milo D. 08/26/2022 8:00 A M Medical Record Number: [id number] Patient Account Number: [id number] Date of Birth/Sex: Treating RN: [birth year] (57 y.o. [name] Primary Care Mahlet Jergens: [name] Other Clinician: Haywood Pao Referring Jerolene Kupfer: Treating Mychele Seyller/Extender: Caleen Essex GESH Weeks in Treatment: 6 Encounter Discharge Information Items Discharge Condition: Stable Ambulatory Status: Wheelchair Discharge Destination: Home Transportation: Private Auto Accompanied By: driver Schedule Follow-up Appointment: No Clinical Summary of Care: Electronic Signature(s) Signed: 08/26/2022 12:53:13 PM By: Haywood Pao CHT EMT BS , , Entered By: Haywood Pao on 08/26/2022 12:53:12 [name] [name] ([id number]) [id number] Page 2 of 2 -------------------------------------------------------------------------------- Vitals Details Patient Name: Date of Service: [name] [name], [name] [name] 08/26/2022 8:00 A M Medical Record Number: [id number] Patient Account Number: [id number] Date of Birth/Sex: Treating RN: 08-11-1965 (57 y.o. [name] Primary Care Delorus Langwell: [name] Other Clinician: Karl Bales Referring Josten Warmuth: Treating Edyth Glomb/Extender: [name], [name]: 6 Vital Signs Time Taken: 08:00 Temperature (F): 97.1 Height (in): 65 Pulse (bpm): 74 Weight (lbs): 105 Respiratory Rate (breaths/min): 18 Body Mass Index (BMI): 17.5 Blood Pressure (mmHg): 115/77 Capillary Blood Glucose (mg/dl): 536 Reference Range: 80 - 120 mg / dl Electronic Signature(s) Signed: 08/26/2022 12:34:39 PM By: Haywood Pao CHT EMT BS , , Entered By: Haywood Pao on 08/26/2022 12:34:39

## 2022-08-26 NOTE — Progress Notes (Signed)
[name], [name] ([id number]) [id number] Page 1 of 2 Visit Report for 08/26/2022 HBO Details Patient Name: Date of Service: [name] [name], [name] [name] 08/26/2022 8:00 A M Medical Record Number: [id number] Patient Account Number: [id number] Date of Birth/Sex: Treating RN: Oct 10, 1965 (57 y.o. [name] Primary Care Hershal Eriksson: [name] Other Clinician: Haywood Pao Referring Alassane Kalafut: Treating Hamzeh Tall/Extender: [name], [name]: 6 HBO Treatment Course Details Treatment Course Number: 2 Ordering Jarmal Lewelling: [name] T Treatments Ordered: otal 40 HBO Treatment Start Date: 07/18/2022 HBO Indication: Diabetic Ulcer(s) of the Lower Extremity Standard/Conservative Wound Care tried and failed greater than or equal to 30 days Wound #4 Right Calcaneus HBO Treatment Details Treatment Number: 27 Patient Type: Outpatient Chamber Type: Monoplace Chamber Serial #: [id number] Treatment Protocol: 2.0 ATA with 90 minutes oxygen, with two 5 minute air breaks Treatment Details Compression Rate Down: 1.5 psi / minute De-Compression Rate Up: 2.0 psi / minute A breaks and breathing ir Compress Tx Pressure periods Decompress Decompress Begins Reached (leave unused spaces Begins Ends blank) Chamber Pressure (ATA 1 2 2 2 2 2  --2 1 ) Clock Time (24 hr) 08:14 08:23 08:53 08:58 09:28 09:33 - - 10:03 10:12 Treatment Length: 118 (minutes) Treatment Segments: 4 Vital Signs Capillary Blood Glucose Reference Range: 80 - 120 mg / dl HBO Diabetic Blood Glucose Intervention Range: <131 mg/dl or >962 mg/dl Type: Time Vitals Blood Respiratory Capillary Blood Glucose Pulse Action Pulse: Temperature: Taken: Pressure: Rate: Glucose (mg/dl): Meter #: Oximetry (%) Taken: Pre 08:00 115/77 74 18 97.1 207 1 none per protocol Post 10:16 151/90 76 18 97.5 127 1 none per protocol Treatment Response Treatment Toleration: Well Treatment  Completion Status: Treatment Completed without Adverse Event Treatment Notes Mrs. Semper arrived with normal vital signs. Blood Glucose Level was 207 mg/dL. She ate a breakfast bar while getting prepared for treatment. Safety check was completed. A water bottle was emptied and filled with 4 oz orange juice to go with patient for safety. Patient was placed in the chamber which was compressed with 100% oxygen at a rate of 2 psi/min after confirming normal ear equalization. She tolerated treatment and subsequent decompression at 2 psi/min. Post-treatment glucose was 127 mg/dL. Other post-treatment vital signs were within range. The patient was stable upon discharge with her driver/family member. Additional Procedure Documentation Tissue Sevierity: Necrosis of bone Physician HBO Attestation: I certify that I supervised this HBO treatment in accordance with Medicare guidelines. A trained emergency response team is readily available per Yes hospital policies and procedures. Continue HBOT as ordered. Yes Electronic Signature(s) Signed: 08/29/2022 10:26:47 AM By: Audley Hose, Signed: 08/29/2022 10:26:47 AM By: Sinda Du D ([id number]) [id number] Page 2 of 2 Previous Signature: 08/26/2022 12:39:57 PM Version By: Haywood Pao CHT EMT BS , , Entered By: [name] on 08/29/2022 10:24:09 -------------------------------------------------------------------------------- HBO Safety Checklist Details Patient Name: Date of Service: [name] [name] 08/26/2022 8:00 A M Medical Record Number: [id number] Patient Account Number: [id number] Date of Birth/Sex: Treating RN: 04-19-1965 (57 y.o. [name] Primary Care Helane Briceno: [name] Other Clinician: Haywood Pao Referring Newel Oien: Treating Arantxa Piercey/Extender: [name], [name]: 6 HBO Safety Checklist Items Safety Checklist Consent Form  Signed Patient voided / foley secured and emptied When did you last eato 0730 peanut butter crackers Last dose of injectable or oral agent 0730 Ostomy pouch emptied and vented if applicable NA All implantable devices assessed, documented and approved  NA Intravenous access site secured and place NA Valuables secured Linens and cotton and cotton/polyester blend (less than 51% polyester) Personal oil-based products / skin lotions / body lotions removed NA Wigs or hairpieces removed NA Smoking or tobacco materials removed Books / newspapers / magazines / loose paper removed Cologne, aftershave, perfume and deodorant removed Jewelry removed (may wrap wedding band) Make-up removed Hair care products removed Human Hair extensions approved Libre 3 Approved, otherwise all electronics Battery operated devices (external) removed removed. Heating patches and chemical warmers removed Titanium eyewear removed Nail polish cured greater than 10 hours Casting material cured greater than 10 hours NA Hearing aids removed NA Loose dentures or partials removed NA Prosthetics have been removed NA Patient demonstrates correct use of air break device (if applicable) Patient concerns have been addressed Patient grounding bracelet on and cord attached to chamber Specifics for Inpatients (complete in addition to above) Medication sheet sent with patient NA Intravenous medications needed or due during therapy sent with patient NA Drainage tubes (e.g. nasogastric tube or chest tube secured and vented) NA Endotracheal or Tracheotomy tube secured NA Cuff deflated of air and inflated with saline NA Airway suctioned NA Notes Paper version used prior to treatment start. Electronic Signature(s) Signed: 08/26/2022 12:36:29 PM By: Haywood Pao CHT EMT BS , , Entered By: Haywood Pao on 08/26/2022 12:36:29

## 2022-08-26 NOTE — Progress Notes (Signed)
[name] [name], [name] [name] ([id number]) [id number] Page 1 of 8 Visit Report for 08/22/2022 Arrival Information Details Patient Name: Date of Service: [name] [name], [name] [name] 08/22/2022 10:30 A M Medical Record Number: [id number] Patient Account Number: [id number] Date of Birth/Sex: Treating RN: [birth year] (57 y.o. F) Primary Care Avree Szczygiel: [name] Other Clinician: Referring Debbie Bellucci: Treating Kriss Perleberg/Extender: [name], [name]: 5 Visit Information History Since Last Visit Added or deleted any medications: No Patient Arrived: Wheel Chair Any new allergies or adverse reactions: No Arrival Time: 10:34 Had a fall or experienced change in No Accompanied By: self activities of daily living that may affect Transfer Assistance: None risk of falls: Patient Identification Verified: Yes Signs or symptoms of abuse/neglect since last visito No Secondary Verification Process Completed: Yes Hospitalized since last visit: No Patient Requires Transmission-Based Precautions: No Implantable device outside of the clinic excluding No Patient Has Alerts: No cellular tissue based products placed in the center since last visit: Has Dressing in Place as Prescribed: Yes Pain Present Now: No Electronic Signature(s) Signed: 08/26/2022 11:18:15 AM By: Thayer Dallas Entered By: Thayer Dallas on 08/22/2022 10:37:11 -------------------------------------------------------------------------------- Encounter Discharge Information Details Patient Name: Date of Service: [name] Milo D. 08/22/2022 10:30 A M Medical Record Number: [id number] Patient Account Number: [id number] Date of Birth/Sex: Treating RN: [birth year] (57 y.o. [name] Primary Care [name]: [name] Other Clinician: Referring Analeigha Nauman: Treating Gurkirat Basher/Extender: [name], [name]: 5 Encounter Discharge Information Items Post Procedure  Vitals Discharge Condition: Stable Temperature (F): 97.5 Ambulatory Status: Wheelchair Pulse (bpm): 75 Discharge Destination: Home Respiratory Rate (breaths/min): 20 Transportation: Private Auto Blood Pressure (mmHg): 145/83 Accompanied By: self Schedule Follow-up Appointment: Yes Clinical Summary of Care: Electronic Signature(s) Signed: 08/22/2022 5:35:38 PM By: Shawn Stall RN, BSN Entered By: Shawn Stall on 08/22/2022 11:04:23 [name] [name] ([id number]) [id number] Page 2 of 8 -------------------------------------------------------------------------------- Lower Extremity Assessment Details Patient Name: Date of Service: [name] [name], [name] [name] 08/22/2022 10:30 A M Medical Record Number: [id number] Patient Account Number: [id number] Date of Birth/Sex: Treating RN: [birth year] (57 y.o. F) Primary Care Shealynn Saulnier: [name] Other Clinician: Referring Ethell Blatchford: Treating Kue Fox/Extender: [name], [name]: 5 Edema Assessment Assessed: [Left: No] [Right: No] [Left: Edema] [Right: :] Calf Left: Right: Point of Measurement: From Medial Instep 29 cm Ankle Left: Right: Point of Measurement: From Medial Instep 21 cm Electronic Signature(s) Signed: 08/26/2022 11:18:15 AM By: Thayer Dallas Entered By: Thayer Dallas on 08/22/2022 10:46:07 -------------------------------------------------------------------------------- Multi Wound Chart Details Patient Name: Date of Service: [name] Milo D. 08/22/2022 10:30 A M Medical Record Number: [id number] Patient Account Number: [id number] Date of Birth/Sex: Treating RN: 1965-06-12 (57 y.o. F) Primary Care Adalea Handler: [name] Other Clinician: Referring Carriann Hesse: Treating Starkisha Tullis/Extender: [name], [name]: 5 Vital Signs Height(in): 65 Capillary Blood Glucose(mg/dl): 025 Weight(lbs): 427 Pulse(bpm): 75 Body Mass Index(BMI):  17.5 Blood Pressure(mmHg): 145/83 Temperature(F): 97.5 Respiratory Rate(breaths/min): 18 [4:Photos:] [N/A:N/A] Right Calcaneus N/A N/A Wound Location: Gradually Appeared N/A N/A Wounding Event: Diabetic Wound/Ulcer of the Lower N/A N/A Primary Etiology: Extremity Hypertension, Type I Diabetes, N/A N/A Comorbid History: Osteoarthritis, Osteomyelitis, CHEREESE, [place] ([id number]) [id number] Page 3 of 8 Neuropathy 05/30/2022 N/A N/A Date Acquired: 5 N/A N/A Weeks of Treatment: Open N/A N/A Wound Status: No N/A N/A Wound Recurrence: 2.5x1.2x0.2 N/A N/A Measurements L x W x D (cm) 2.356 N/A N/A A (cm) : rea 0.471 N/A N/A  Volume (cm) : 73.00% N/A N/A % Reduction in A rea: 82.00% N/A N/A % Reduction in Volume: Grade 2 N/A N/A Classification: Medium N/A N/A Exudate A mount: Serosanguineous N/A N/A Exudate Type: red, brown N/A N/A Exudate Color: Large (67-100%) N/A N/A Granulation A mount: Red N/A N/A Granulation Quality: None Present (0%) N/A N/A Necrotic A mount: Fat Layer (Subcutaneous Tissue): Yes N/A N/A Exposed Structures: Fascia: No Tendon: No Muscle: No Joint: No Bone: No None N/A N/A Epithelialization: Debridement - Excisional N/A N/A Debridement: Pre-procedure Verification/Time Out 10:55 N/A N/A Taken: Lidocaine 4% Topical Solution N/A N/A Pain Control: Subcutaneous, Slough N/A N/A Tissue Debrided: Skin/Subcutaneous Tissue N/A N/A Level: 2.36 N/A N/A Debridement A (sq cm): rea Curette N/A N/A Instrument: Minimum N/A N/A Bleeding: Pressure N/A N/A Hemostasis A chieved: 0 N/A N/A Procedural Pain: 0 N/A N/A Post Procedural Pain: Procedure was tolerated well N/A N/A Debridement Treatment Response: 2.5x1.2x0.2 N/A N/A Post Debridement Measurements L x W x D (cm) 0.471 N/A N/A Post Debridement Volume: (cm) No Abnormalities Noted N/A N/A Periwound Skin Texture: Maceration: Yes N/A N/A Periwound Skin  Moisture: No Abnormalities Noted N/A N/A Periwound Skin Color: Hot N/A N/A Temperature: Debridement N/A N/A Procedures Performed: Treatment Notes Wound #4 (Calcaneus) Wound Laterality: Right Cleanser Dakins Discharge Instruction: cleanse with Dakin's Peri-Wound Care Topical Primary Dressing Hydrofera Blue Classic Foam, 4x4 in Discharge Instruction: Moisten with saline prior to applying to wound bed Secondary Dressing Woven Gauze Sponge, Non-Sterile 4x4 in Discharge Instruction: Apply over primary dressing as directed. Zetuvit Plus Silicone Border Dressing 5x5 (in/in) Discharge Instruction: Apply silicone border over primary dressing as directed. Secured With Compression Wrap Compression Stockings Add-Ons Electronic Signature(s) Signed: 08/22/2022 4:29:34 PM By: [name] DO Entered By: [name] on 08/22/2022 11:10:15 [name] Milo D ([id number]) [id number] Page 4 of 8 -------------------------------------------------------------------------------- Multi-Disciplinary Care Plan Details Patient Name: Date of Service: [name] [name], [name] [name] 08/22/2022 10:30 A M Medical Record Number: [id number] Patient Account Number: [id number] Date of Birth/Sex: Treating RN: 19-Jul-1965 (57 y.o. [name], [name] Primary Care Clara Herbison: [name] Other Clinician: Referring Keyshon Stein: Treating Alazne Quant/Extender: [name], [name]: 5 Active Inactive HBO Nursing Diagnoses: Anxiety related to feelings of confinement associated with the hyperbaric oxygen chamber Anxiety related to knowledge deficit of hyperbaric oxygen therapy and treatment procedures Potential for barotraumas to ears, sinuses, teeth, and lungs or cerebral gas embolism related to changes in atmospheric pressure inside hyperbaric oxygen chamber Potential for oxygen toxicity seizures related to delivery of 100% oxygen at an increased atmospheric  pressure Potential for pulmonary oxygen toxicity related to delivery of 100% oxygen at an increased atmospheric pressure Goals: Patient and/or family will be able to state/discuss factors appropriate to the management of their disease process during treatment Date Initiated: 07/12/2022 Target Resolution Date: 09/30/2022 Goal Status: Active Patient will tolerate the hyperbaric oxygen therapy treatment Date Initiated: 07/12/2022 Target Resolution Date: 09/23/2022 Goal Status: Active Patient/caregiver will verbalize understanding of HBO goals, rationale, procedures and potential hazards Date Initiated: 07/12/2022 Date Inactivated: 08/12/2022 Target Resolution Date: 08/09/2022 Goal Status: Met Interventions: Administer a five (5) minute air break for patient if signs and symptoms of seizure appear and notify the hyperbaric physician Administer a ten (10) minute air break for patient if signs and symptoms of seizure appear and notify the hyperbaric physician Administer decongestants, per physician orders, prior to HBO2 Administer the correct therapeutic gas delivery based on the patients needs and limitations, per physician order Assess and provide for  patients comfort related to the hyperbaric environment and equalization of middle ear Assess for signs and symptoms related to adverse events, including but not limited to confinement anxiety, pneumothorax, oxygen toxicity and baurotrauma Assess patient for any history of confinement anxiety Assess patient's knowledge and expectations regarding hyperbaric medicine and provide education related to the hyperbaric environment, goals of treatment and prevention of adverse events Implement protocols to decrease risk of pneumothorax in high risk patients Notes: Nutrition Nursing Diagnoses: Imbalanced nutrition Impaired glucose control: actual or potential Goals: Patient/caregiver verbalizes understanding of need to maintain therapeutic glucose control  per primary care physician Date Initiated: 07/12/2022 Target Resolution Date: 08/22/2022 Goal Status: Active Patient/caregiver will maintain therapeutic glucose control Date Initiated: 07/12/2022 Target Resolution Date: 09/23/2022 Goal Status: Active Interventions: Assess HgA1c results as ordered upon admission and as needed Assess patient nutrition upon admission and as needed per policy Provide education on elevated blood sugars and impact on wound healing Provide education on nutrition [name] [name], [name] [name] ([id number]) [id number] Page 5 of 8 Treatment Activities: Education provided on Nutrition : 07/12/2022 Notes: Osteomyelitis Nursing Diagnoses: Infection: osteomyelitis Knowledge deficit related to disease process and management Goals: Patient/caregiver will verbalize understanding of disease process and disease management Date Initiated: 07/12/2022 Date Inactivated: 08/12/2022 Target Resolution Date: 08/18/2022 Goal Status: Met Patient's osteomyelitis will resolve Date Initiated: 07/12/2022 Target Resolution Date: 10/28/2022 Goal Status: Active Signs and symptoms for osteomyelitis will be recognized and promptly addressed Date Initiated: 07/12/2022 Target Resolution Date: 09/13/2022 Goal Status: Active Interventions: Assess for signs and symptoms of osteomyelitis resolution every visit Provide education on osteomyelitis Notes: Electronic Signature(s) Signed: 08/22/2022 5:35:38 PM By: Shawn Stall RN, BSN Entered By: Shawn Stall on 08/22/2022 10:59:37 -------------------------------------------------------------------------------- Pain Assessment Details Patient Name: Date of Service: [name] Milo D. 08/22/2022 10:30 A M Medical Record Number: [id number] Patient Account Number: [id number] Date of Birth/Sex: Treating RN: September 18, 1965 (57 y.o. F) Primary Care Cheyla Duchemin: [name] Other Clinician: Referring Rhyan Radler: Treating Shainna Faux/Extender:  [name], [name]: 5 Active Problems Location of Pain Severity and Description of Pain Patient Has Paino No Site Locations Pain Management and Medication Current Pain Management: [name] [name], [name] [name] ([id number]) [id number] Page 6 of 8 Electronic Signature(s) Signed: 08/26/2022 11:18:15 AM By: Thayer Dallas Entered By: Thayer Dallas on 08/22/2022 10:37:46 -------------------------------------------------------------------------------- Patient/Caregiver Education Details Patient Name: Date of Service: [name] [name] 6/24/2024andnbsp10:30 A M Medical Record Number: [id number] Patient Account Number: [id number] Date of Birth/Gender: Treating RN: 30-Aug-1965 (57 y.o. [name] Primary Care Physician: [name] Other Clinician: Referring Physician: Treating Physician/Extender: Caleen Essex GESH Weeks in Treatment: 5 Education Assessment Education Provided To: Patient Education Topics Provided Wound/Skin Impairment: Handouts: Caring for Your Ulcer Methods: Explain/Verbal Responses: Reinforcements needed Electronic Signature(s) Signed: 08/22/2022 5:35:38 PM By: Shawn Stall RN, BSN Entered By: Shawn Stall on 08/22/2022 11:00:04 -------------------------------------------------------------------------------- Wound Assessment Details Patient Name: Date of Service: [name] Milo D. 08/22/2022 10:30 A M Medical Record Number: [id number] Patient Account Number: [id number] Date of Birth/Sex: Treating RN: [birth year] (57 y.o. F) Primary Care Jissell Trafton: [name] Other Clinician: Referring Shelsie Tijerino: Treating Marselino Slayton/Extender: [name], [name]: 5 Wound Status Wound Number: 4 Primary Diabetic Wound/Ulcer of the Lower Extremity Etiology: Wound Location: Right Calcaneus Wound Status: Open Wounding Event: Gradually Appeared Comorbid Hypertension, Type I  Diabetes, Osteoarthritis, Osteomyelitis, Date Acquired: 05/30/2022 History: Neuropathy Weeks Of Treatment: 5 Clustered Wound: No Photos [name] [name], [name] [name] ([id number]) [id number] Page 7 of  8 Wound Measurements Length: (cm) 2.5 Width: (cm) 1.2 Depth: (cm) 0.2 Area: (cm) 2.356 Volume: (cm) 0.471 % Reduction in Area: 73% % Reduction in Volume: 82% Epithelialization: None Tunneling: No Undermining: No Wound Description Classification: Grade 2 Exudate Amount: Medium Exudate Type: Serosanguineous Exudate Color: red, brown Foul Odor After Cleansing: No Slough/Fibrino Yes Wound Bed Granulation Amount: Large (67-100%) Exposed Structure Granulation Quality: Red Fascia Exposed: No Necrotic Amount: None Present (0%) Fat Layer (Subcutaneous Tissue) Exposed: Yes Tendon Exposed: No Muscle Exposed: No Joint Exposed: No Bone Exposed: No Periwound Skin Texture Texture Color No Abnormalities Noted: Yes No Abnormalities Noted: Yes Moisture Temperature / Pain No Abnormalities Noted: No Temperature: Hot Maceration: Yes Treatment Notes Wound #4 (Calcaneus) Wound Laterality: Right Cleanser Dakins Discharge Instruction: cleanse with Dakin's Peri-Wound Care Topical Primary Dressing Hydrofera Blue Classic Foam, 4x4 in Discharge Instruction: Moisten with saline prior to applying to wound bed Secondary Dressing Woven Gauze Sponge, Non-Sterile 4x4 in Discharge Instruction: Apply over primary dressing as directed. Zetuvit Plus Silicone Border Dressing 5x5 (in/in) Discharge Instruction: Apply silicone border over primary dressing as directed. Secured With Compression Wrap Compression Stockings Facilities manager) Signed: 08/26/2022 11:18:15 AM By: Levon Hedger, Unknown Foley D ([id number]) [id number] Page 8 of 8 Entered By: Thayer Dallas on 08/22/2022  10:48:40 -------------------------------------------------------------------------------- Vitals Details Patient Name: Date of Service: [name] [name], [name] [name] 08/22/2022 10:30 A M Medical Record Number: [id number] Patient Account Number: [id number] Date of Birth/Sex: Treating RN: 1965-07-09 (57 y.o. F) Primary Care Nasha Diss: [name] Other Clinician: Referring Marcelle Hepner: Treating Shashank Kwasnik/Extender: [name], [name]: 5 Vital Signs Time Taken: 10:14 Temperature (F): 97.5 Height (in): 65 Pulse (bpm): 75 Weight (lbs): 105 Respiratory Rate (breaths/min): 18 Body Mass Index (BMI): 17.5 Blood Pressure (mmHg): 145/83 Capillary Blood Glucose (mg/dl): 308 Reference Range: 80 - 120 mg / dl Electronic Signature(s) Signed: 08/26/2022 11:18:15 AM By: Thayer Dallas Entered By: Thayer Dallas on 08/22/2022 10:37:39

## 2022-08-29 ENCOUNTER — Encounter (HOSPITAL_BASED_OUTPATIENT_CLINIC_OR_DEPARTMENT_OTHER): Payer: BC Managed Care – PPO | Attending: Internal Medicine | Admitting: Internal Medicine

## 2022-08-29 ENCOUNTER — Encounter (HOSPITAL_BASED_OUTPATIENT_CLINIC_OR_DEPARTMENT_OTHER): Payer: BC Managed Care – PPO | Admitting: Internal Medicine

## 2022-08-29 DIAGNOSIS — M86671 Other chronic osteomyelitis, right ankle and foot: Secondary | ICD-10-CM

## 2022-08-29 DIAGNOSIS — E10621 Type 1 diabetes mellitus with foot ulcer: Secondary | ICD-10-CM

## 2022-08-29 DIAGNOSIS — L97514 Non-pressure chronic ulcer of other part of right foot with necrosis of bone: Secondary | ICD-10-CM

## 2022-08-29 LAB — GLUCOSE, CAPILLARY
Glucose-Capillary: 234 mg/dL — ABNORMAL HIGH (ref 70–99)
Glucose-Capillary: 133 mg/dL — ABNORMAL HIGH (ref 70–99)

## 2022-08-29 NOTE — Progress Notes (Signed)
[name] [name], [name] [name] ([id number]) [id number] Page 1 of 2 Visit Report for 08/29/2022 Arrival Information Details Patient Name: Date of Service: [name] [name], [name] [name] 08/29/2022 7:30 A M Medical Record Number: [id number] Patient Account Number: [id number] Date of Birth/Sex: Treating RN: [birth year] (57 y.o. Katrinka Blazing Primary Care Indiah Heyden: [name] Other Clinician: Karl Bales Referring Karmin Kasprzak: Treating Tersa Fotopoulos/Extender: [name], [name]: 6 Visit Information History Since Last Visit All ordered tests and consults were completed: Yes Patient Arrived: Wheel Chair Added or deleted any medications: No Arrival Time: 07:35 Any new allergies or adverse reactions: No Accompanied By: Husband Had a fall or experienced change in No Transfer Assistance: None activities of daily living that may affect Patient Identification Verified: Yes risk of falls: Secondary Verification Process Completed: Yes Signs or symptoms of abuse/neglect since last visito No Patient Requires Transmission-Based Precautions: No Hospitalized since last visit: No Patient Has Alerts: No Implantable device outside of the clinic excluding No cellular tissue based products placed in the center since last visit: Pain Present Now: No Electronic Signature(s) Signed: 08/29/2022 2:34:11 PM By: Karl Bales EMT Entered By: Karl Bales on 08/29/2022 14:34:11 -------------------------------------------------------------------------------- Encounter Discharge Information Details Patient Name: Date of Service: [name] Milo D. 08/29/2022 7:30 A M Medical Record Number: [id number] Patient Account Number: [id number] Date of Birth/Sex: Treating RN: [birth year] (57 y.o. Katrinka Blazing Primary Care Dinesha Twiggs: [name] Other Clinician: Karl Bales Referring Miguel Medal: Treating Elayne Gruver/Extender: Caleen Essex GESH Weeks in  Treatment: 6 Encounter Discharge Information Items Discharge Condition: Stable Ambulatory Status: Wheelchair Discharge Destination: Other (Note Required) Transportation: Other Accompanied By: None Schedule Follow-up Appointment: Yes Clinical Summary of Care: Notes The patient was discharged to the [facility]. Electronic Signature(s) Signed: 08/29/2022 3:15:07 PM By: Karl Bales EMT Entered By: Karl Bales on 08/29/2022 15:15:07 [name] [name] ([id number]) [id number] Page 2 of 2 -------------------------------------------------------------------------------- Vitals Details Patient Name: Date of Service: [name] [name], [name] [name] 08/29/2022 7:30 A M Medical Record Number: [id number] Patient Account Number: [id number] Date of Birth/Sex: Treating RN: [birth year] (57 y.o. Katrinka Blazing Primary Care Jomarie Gellis: [name] Other Clinician: Karl Bales Referring Darlisha Kelm: Treating Salimata Christenson/Extender: [name], [name]: 6 Vital Signs Time Taken: 07:49 Temperature (F): 97.1 Height (in): 65 Pulse (bpm): 68 Weight (lbs): 105 Respiratory Rate (breaths/min): 18 Body Mass Index (BMI): 17.5 Blood Pressure (mmHg): 110/65 Capillary Blood Glucose (mg/dl): 433 Reference Range: 80 - 120 mg / dl Electronic Signature(s) Signed: 08/29/2022 2:35:03 PM By: Karl Bales EMT Entered By: Karl Bales on 08/29/2022 14:35:02

## 2022-08-29 NOTE — Progress Notes (Signed)
LEGACEE, CREEKMORE ([id number]) [id number] Page 1 of 1 Visit Report for 08/26/2022 SuperBill Details Patient Name: Date of Service: [name] [name], [name] [name] 08/26/2022 Medical Record Number: [id number] Patient Account Number: [id number] Date of Birth/Sex: Treating RN: January 27, 1966 (57 y.o. 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 osteomyelitis, right ankle and foot E10.621 Type 1 diabetes mellitus with foot ulcer Facility Procedures CPT4 Code Description Modifier Quantity [id number]-(Facility Use Only) HBOT full body chamber, , 4 ICD-10 Diagnosis Description E10.621 Type 1 diabetes mellitus with foot ulcer L97.514 Non-pressure chronic ulcer of other part of right foot with necrosis of bone M86.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

## 2022-08-29 NOTE — Progress Notes (Signed)
[name] [name] ([id number]) [id number] Page 1 of 9 Visit Report for 08/29/2022 Arrival Information Details Patient Name: Date of Service: [name] [name], [name] [name] 08/29/2022 11:00 A M Medical Record Number: [id number] Patient Account Number: [id number] Date of Birth/Sex: Treating RN: [birth year] (57 y.o. [name] Primary Care Marcell Pfeifer: [name] Other Clinician: Referring Yudith Norlander: Treating Agnieszka Newhouse/Extender: [name], [name]: 6 Visit Information History Since Last Visit Added or deleted any medications: No Patient Arrived: Wheel Chair Any new allergies or adverse reactions: No Arrival Time: 10:41 Had a fall or experienced change in No Accompanied By: self activities of daily living that may affect Transfer Assistance: None risk of falls: Patient Identification Verified: Yes Signs or symptoms of abuse/neglect since last No Secondary Verification Process Completed: Yes visito Patient Requires Transmission-Based Precautions: No Hospitalized since last visit: No Patient Has Alerts: No Implantable device outside of the clinic No excluding cellular tissue based products placed in the center since last visit: Has Dressing in Place as Prescribed: Yes Has Footwear/Offloading in Place as Yes Prescribed: Right: Removable Cast Walker/Walking Boot Pain Present Now: No Electronic Signature(s) Signed: 08/29/2022 4:45:04 PM By: Shawn Stall RN, BSN Entered By: Shawn Stall on 08/29/2022 10:41:40 -------------------------------------------------------------------------------- Clinic Level of Care Assessment Details Patient Name: Date of Service: [name] [name] 08/29/2022 11:00 A M Medical Record Number: [id number] Patient Account Number: [id number] Date of Birth/Sex: Treating RN: [birth year] (57 y.o. [name] Primary Care Loryn Haacke: [name] Other Clinician: Referring Sherleen Pangborn: Treating Jeraldin Fesler/Extender:  [name], [name]: 6 Clinic Level of Care Assessment Items TOOL 4 Quantity Score X- 1 0 Use when only an EandM is performed on FOLLOW-UP visit ASSESSMENTS - Nursing Assessment / Reassessment X- 1 10 Reassessment of Co-morbidities (includes updates in patient status) X- 1 5 Reassessment of Adherence to Treatment Plan ASSESSMENTS - Wound and Skin A ssessment / Reassessment X - Simple Wound Assessment / Reassessment - one wound 1 5 []  - 0 Complex Wound Assessment / Reassessment - multiple wounds []  - 0 Dermatologic / Skin Assessment (not related to wound area) [name] [name] ([id number]) [id number] Page 2 of 9 ASSESSMENTS - Focused Assessment X- 1 5 Circumferential Edema Measurements - multi extremities []  - 0 Nutritional Assessment / Counseling / Intervention []  - 0 Lower Extremity Assessment (monofilament, tuning fork, pulses) []  - 0 Peripheral Arterial Disease Assessment (using hand held doppler) ASSESSMENTS - Ostomy and/or Continence Assessment and Care []  - 0 Incontinence Assessment and Management []  - 0 Ostomy Care Assessment and Management (repouching, etc.) PROCESS - Coordination of Care X - Simple Patient / Family Education for ongoing care 1 15 []  - 0 Complex (extensive) Patient / Family Education for ongoing care X- 1 10 Staff obtains Chiropractor, Records, T Results / Process Orders est []  - 0 Staff telephones HHA, Nursing Homes / Clarify orders / etc []  - 0 Routine Transfer to another Facility (non-emergent condition) []  - 0 Routine Hospital Admission (non-emergent condition) []  - 0 New Admissions / Manufacturing engineer / Ordering NPWT Apligraf, etc. , []  - 0 Emergency Hospital Admission (emergent condition) X- 1 10 Simple Discharge Coordination []  - 0 Complex (extensive) Discharge Coordination PROCESS - Special Needs []  - 0 Pediatric / Minor Patient Management []  - 0 Isolation Patient  Management []  - 0 Hearing / Language / Visual special needs []  - 0 Assessment of Community assistance (transportation, [name]/C planning, etc.) []  - 0 Additional assistance / Altered mentation []  -  0 Support Surface(s) Assessment (bed, cushion, seat, etc.) INTERVENTIONS - Wound Cleansing / Measurement X - Simple Wound Cleansing - one wound 1 5 []  - 0 Complex Wound Cleansing - multiple wounds X- 1 5 Wound Imaging (photographs - any number of wounds) []  - 0 Wound Tracing (instead of photographs) X- 1 5 Simple Wound Measurement - one wound []  - 0 Complex Wound Measurement - multiple wounds INTERVENTIONS - Wound Dressings X - Small Wound Dressing one or multiple wounds 1 10 []  - 0 Medium Wound Dressing one or multiple wounds []  - 0 Large Wound Dressing one or multiple wounds []  - 0 Application of Medications - topical []  - 0 Application of Medications - injection INTERVENTIONS - Miscellaneous []  - 0 External ear exam []  - 0 Specimen Collection (cultures, biopsies, blood, body fluids, etc.) []  - 0 Specimen(s) / Culture(s) sent or taken to Lab for analysis []  - 0 Patient Transfer (multiple staff / Michiel Sites Lift / Similar devices) []  - 0 Simple Staple / Suture removal (25 or less) [name] [name] ([id number]) [id number] Page 3 of 9 []  - 0 Complex Staple / Suture removal (26 or more) []  - 0 Hypo / Hyperglycemic Management (close monitor of Blood Glucose) []  - 0 Ankle / Brachial Index (ABI) - do not check if billed separately X- 1 5 Vital Signs Has the patient been seen at the hospital within the last three years: Yes Total Score: 90 Level Of Care: New/Established - Level 3 Electronic Signature(s) Signed: 08/29/2022 4:45:04 PM By: Shawn Stall RN, BSN Entered By: Shawn Stall on 08/29/2022 11:10:15 -------------------------------------------------------------------------------- Encounter Discharge Information Details Patient Name: Date of  Service: [name] Milo [name]. 08/29/2022 11:00 A M Medical Record Number: [id number] Patient Account Number: [id number] Date of Birth/Sex: Treating RN: Jul 16, 1965 (57 y.o. [name] Primary Care Azul Coffie: [name] Other Clinician: Referring Sherline Eberwein: Treating Kaitland Lewellyn/Extender: [name], [name]: 6 Encounter Discharge Information Items Discharge Condition: Stable Ambulatory Status: Wheelchair Discharge Destination: Home Transportation: Private Auto Accompanied By: self Schedule Follow-up Appointment: Yes Clinical Summary of Care: Electronic Signature(s) Signed: 08/29/2022 4:45:04 PM By: Shawn Stall RN, BSN Entered By: Shawn Stall on 08/29/2022 11:14:02 -------------------------------------------------------------------------------- Lower Extremity Assessment Details Patient Name: Date of Service: [name] [name], [name] [name] 08/29/2022 11:00 A M Medical Record Number: [id number] Patient Account Number: [id number] Date of Birth/Sex: Treating RN: [birth year] (57 y.o. [name], [name] Primary Care Timira Bieda: [name] Other Clinician: Referring Jame Morrell: Treating Yeng Frankie/Extender: [name], [name]: 6 Edema Assessment Assessed: [Left: No] [Right: Yes] Edema: [Left: Ye] [Right: s] Calf Left: Right: Point of Measurement: From Medial Instep 26 cm Ankle Left: Right: RICKISHA, PRIMACK ([id number]) [id number] Page 4 of 9 Point of Measurement: From Medial Instep 20.5 cm Vascular Assessment Pulses: Dorsalis Pedis Palpable: [Right:Yes] Electronic Signature(s) Signed: 08/29/2022 4:45:04 PM By: Shawn Stall RN, BSN Entered By: Shawn Stall on 08/29/2022 10:45:09 -------------------------------------------------------------------------------- Multi Wound Chart Details Patient Name: Date of Service: [name] Milo [name]. 08/29/2022 11:00 A M Medical Record Number: [id number] Patient Account  Number: [id number] Date of Birth/Sex: Treating RN: April 05, 1965 (57 y.o. F) Primary Care Khyson Sebesta: [name] Other Clinician: Referring Kiasia Chou: Treating Areen Trautner/Extender: [name], [name]: 6 [4:Photos:] [N/A:N/A] Right Calcaneus N/A N/A Wound Location: Gradually Appeared N/A N/A Wounding Event: Diabetic Wound/Ulcer of the Lower N/A N/A Primary Etiology: Extremity Hypertension, Type I Diabetes, N/A N/A Comorbid History: Osteoarthritis, Osteomyelitis, Neuropathy 05/30/2022 N/A N/A  Date Acquired: 6 N/A N/A Weeks of Treatment: Open N/A N/A Wound Status: No N/A N/A Wound Recurrence: 1.8x1x0.2 N/A N/A Measurements L x W x [name] (cm) 1.414 N/A N/A A (cm) : rea 0.283 N/A N/A Volume (cm) : 83.80% N/A N/A % Reduction in A rea: 89.20% N/A N/A % Reduction in Volume: Grade 2 N/A N/A Classification: Medium N/A N/A Exudate A mount: Serosanguineous N/A N/A Exudate Type: red, brown N/A N/A Exudate Color: Distinct, outline attached N/A N/A Wound Margin: Large (67-100%) N/A N/A Granulation A mount: Red N/A N/A Granulation Quality: None Present (0%) N/A N/A Necrotic A mount: Fat Layer (Subcutaneous Tissue): Yes N/A N/A Exposed Structures: Fascia: No Tendon: No Muscle: No Joint: No Bone: No Medium (34-66%) N/A N/A Epithelialization: No Abnormalities Noted N/A N/A Periwound Skin Texture: Maceration: Yes N/A N/A Periwound Skin Moisture: No Abnormalities Noted N/A N/A Periwound Skin Color: Hot N/A N/A Temperature: Treatment Notes [name] [name], [name] [name] ([id number]) [phone].pdf Page 5 of 9 Wound #4 (Calcaneus) Wound Laterality: Right Cleanser Dakins Discharge Instruction: cleanse with Dakin's Peri-Wound Care Topical Primary Dressing Hydrofera Blue Classic Foam, 4x4 in Discharge Instruction: Moisten with saline prior to applying to wound bed Secondary Dressing Woven Gauze Sponge, Non-Sterile 4x4  in Discharge Instruction: Apply over primary dressing as directed. Zetuvit Plus Silicone Border Dressing 5x5 (in/in) Discharge Instruction: Apply silicone border over primary dressing as directed. Secured With Compression Wrap Compression Stockings Add-Ons Electronic Signature(s) Signed: 08/29/2022 2:53:49 PM By: [name] DO Entered By: [name] on 08/29/2022 11:26:24 -------------------------------------------------------------------------------- Multi-Disciplinary Care Plan Details Patient Name: Date of Service: [name] Milo [name]. 08/29/2022 11:00 A M Medical Record Number: [id number] Patient Account Number: [id number] Date of Birth/Sex: Treating RN: 1965-12-11 (56 y.o. [name], [name] Primary Care Lauralei Clouse: [name] Other Clinician: Referring Lacorey Brusca: Treating Khadar Monger/Extender: [name], [name]: 6 Active Inactive HBO Nursing Diagnoses: Anxiety related to feelings of confinement associated with the hyperbaric oxygen chamber Anxiety related to knowledge deficit of hyperbaric oxygen therapy and treatment procedures Potential for barotraumas to ears, sinuses, teeth, and lungs or cerebral gas embolism related to changes in atmospheric pressure inside hyperbaric oxygen chamber Potential for oxygen toxicity seizures related to delivery of 100% oxygen at an increased atmospheric pressure Potential for pulmonary oxygen toxicity related to delivery of 100% oxygen at an increased atmospheric pressure Goals: Patient and/or family will be able to state/discuss factors appropriate to the management of their disease process during treatment Date Initiated: 07/12/2022 Target Resolution Date: 09/30/2022 Goal Status: Active Patient will tolerate the hyperbaric oxygen therapy treatment Date Initiated: 07/12/2022 Target Resolution Date: 09/23/2022 Goal Status: Active Patient/caregiver will verbalize understanding of HBO goals, rationale,  procedures and potential hazards Date Initiated: 07/12/2022 Date Inactivated: 08/12/2022 Target Resolution Date: 08/09/2022 Goal Status: Met Interventions: [name] [name], [name] [name] ([id number]) [phone].pdf Page 6 of 9 Administer a five (5) minute air break for patient if signs and symptoms of seizure appear and notify the hyperbaric physician Administer a ten (10) minute air break for patient if signs and symptoms of seizure appear and notify the hyperbaric physician Administer decongestants, per physician orders, prior to HBO2 Administer the correct therapeutic gas delivery based on the patients needs and limitations, per physician order Assess and provide for patients comfort related to the hyperbaric environment and equalization of middle ear Assess for signs and symptoms related to adverse events, including but not limited to confinement anxiety, pneumothorax, oxygen toxicity and baurotrauma Assess patient for any history of confinement anxiety Assess patient's  knowledge and expectations regarding hyperbaric medicine and provide education related to the hyperbaric environment, goals of treatment and prevention of adverse events Implement protocols to decrease risk of pneumothorax in high risk patients Notes: Nutrition Nursing Diagnoses: Imbalanced nutrition Impaired glucose control: actual or potential Goals: Patient/caregiver verbalizes understanding of need to maintain therapeutic glucose control per primary care physician Date Initiated: 07/12/2022 Target Resolution Date: 08/22/2022 Goal Status: Active Patient/caregiver will maintain therapeutic glucose control Date Initiated: 07/12/2022 Target Resolution Date: 09/23/2022 Goal Status: Active Interventions: Assess HgA1c results as ordered upon admission and as needed Assess patient nutrition upon admission and as needed per policy Provide education on elevated blood sugars and impact on wound healing Provide  education on nutrition Treatment Activities: Education provided on Nutrition : 07/12/2022 Notes: Osteomyelitis Nursing Diagnoses: Infection: osteomyelitis Knowledge deficit related to disease process and management Goals: Patient/caregiver will verbalize understanding of disease process and disease management Date Initiated: 07/12/2022 Date Inactivated: 08/12/2022 Target Resolution Date: 08/18/2022 Goal Status: Met Patient's osteomyelitis will resolve Date Initiated: 07/12/2022 Target Resolution Date: 10/28/2022 Goal Status: Active Signs and symptoms for osteomyelitis will be recognized and promptly addressed Date Initiated: 07/12/2022 Target Resolution Date: 09/13/2022 Goal Status: Active Interventions: Assess for signs and symptoms of osteomyelitis resolution every visit Provide education on osteomyelitis Notes: Electronic Signature(s) Signed: 08/29/2022 4:45:04 PM By: Shawn Stall RN, BSN Entered By: Shawn Stall on 08/29/2022 10:48:47 Pain Assessment Details -------------------------------------------------------------------------------- [name] [name] ([id number]) [id number] Page 7 of 9 Patient Name: Date of Service: [name] [name], [name] [name] 08/29/2022 11:00 A M Medical Record Number: [id number] Patient Account Number: [id number] Date of Birth/Sex: Treating RN: 1965-07-03 (57 y.o. [name] Primary Care Pete Schnitzer: [name] Other Clinician: Referring Haiden Clucas: Treating Gricelda Foland/Extender: [name], [name]: 6 Active Problems Location of Pain Severity and Description of Pain Patient Has Paino No Site Locations Pain Management and Medication Current Pain Management: Electronic Signature(s) Signed: 08/29/2022 4:45:04 PM By: Shawn Stall RN, BSN Entered By: Shawn Stall on 08/29/2022 10:41:46 -------------------------------------------------------------------------------- Patient/Caregiver Education  Details Patient Name: Date of Service: [name] [name], [name] [name]. 7/1/2024andnbsp11:00 A M Medical Record Number: [id number] Patient Account Number: [id number] Date of Birth/Gender: Treating RN: [birth year] (57 y.o. [name] Primary Care Physician: [name] Other Clinician: Referring Physician: Treating Physician/Extender: Caleen Essex GESH Weeks in Treatment: 6 Education Assessment Education Provided To: Patient Education Topics Provided Wound/Skin Impairment: Handouts: Caring for Your Ulcer Methods: Explain/Verbal Responses: Reinforcements needed Electronic Signature(s) Signed: 08/29/2022 4:45:04 PM By: Shawn Stall RN, BSN Entered By: Shawn Stall on 08/29/2022 10:48:58 [name] [name] ([id number]) [id number] Page 8 of 9 -------------------------------------------------------------------------------- Wound Assessment Details Patient Name: Date of Service: [name] [name], [name] [name] 08/29/2022 11:00 A M Medical Record Number: [id number] Patient Account Number: [id number] Date of Birth/Sex: Treating RN: 1965-08-26 (57 y.o. [name] Primary Care Kabe Mckoy: [name] Other Clinician: Referring Derelle Cockrell: Treating Breck [place]/Extender: [name], [name]: 6 Wound Status Wound Number: 4 Primary Diabetic Wound/Ulcer of the Lower Extremity Etiology: Wound Location: Right Calcaneus Wound Status: Open Wounding Event: Gradually Appeared Comorbid Hypertension, Type I Diabetes, Osteoarthritis, Osteomyelitis, Date Acquired: 05/30/2022 History: Neuropathy Weeks Of Treatment: 6 Clustered Wound: No Photos Wound Measurements Length: (cm) 1 Width: (cm) 1 Depth: (cm) 0 Area: (cm) Volume: (cm) .8 % Reduction in Area: 83.8% % Reduction in Volume: 89.2% .2 Epithelialization: Medium (34-66%) 1.414 Tunneling: No 0.283 Undermining: No Wound Description Classification: Grade 2 Wound Margin: Distinct,  outline  attached Exudate Amount: Medium Exudate Type: Serosanguineous Exudate Color: red, brown Foul Odor After Cleansing: No Slough/Fibrino No Wound Bed Granulation Amount: Large (67-100%) Exposed Structure Granulation Quality: Red Fascia Exposed: No Necrotic Amount: None Present (0%) Fat Layer (Subcutaneous Tissue) Exposed: Yes Tendon Exposed: No Muscle Exposed: No Joint Exposed: No Bone Exposed: No Periwound Skin Texture Texture Color No Abnormalities Noted: Yes No Abnormalities Noted: Yes Moisture Temperature / Pain No Abnormalities Noted: No Temperature: Hot Maceration: Yes Treatment Notes Wound #4 (Calcaneus) Wound Laterality: Right [address] [name] [name], [name] [name] ([id number]) [id number] Page 9 of 9 Discharge Instruction: cleanse with Dakin's Peri-Wound Care Topical Primary Dressing Hydrofera Blue Classic Foam, 4x4 in Discharge Instruction: Moisten with saline prior to applying to wound bed Secondary Dressing Woven Gauze Sponge, Non-Sterile 4x4 in Discharge Instruction: Apply over primary dressing as directed. Zetuvit Plus Silicone Border Dressing 5x5 (in/in) Discharge Instruction: Apply silicone border over primary dressing as directed. Secured With Compression Wrap Compression Stockings Facilities manager) Signed: 08/29/2022 2:57:52 PM By: Brenton Grills Signed: 08/29/2022 4:45:04 PM By: Shawn Stall RN, BSN Entered By: Brenton Grills on 08/29/2022 10:47:21 -------------------------------------------------------------------------------- Vitals Details Patient Name: Date of Service: [name] Milo [name]. 08/29/2022 11:00 A M Medical Record Number: [id number] Patient Account Number: [id number] Date of Birth/Sex: Treating RN: 02-04-1966 (57 y.o. [name] Primary Care Lafreda Casebeer: [name] Other Clinician: Referring Toni Demo: Treating Syon Tews/Extender: [name], [name]:  6 Vital Signs Time Taken: 10:40 Reference Range: 80 - 120 mg / dl Height (in): 65 Weight (lbs): 105 Body Mass Index (BMI): 17.5 Notes see post vital signs from HBO. Electronic Signature(s) Signed: 08/29/2022 4:45:04 PM By: Shawn Stall RN, BSN Entered By: Shawn Stall on 08/29/2022 10:42:03

## 2022-08-29 NOTE — Progress Notes (Signed)
RAYDEN, TURRO ([id number]) [id number] Page 1 of 2 Visit Report for 08/29/2022 Problem List Details Patient Name: Date of Service: [name] [name], [name] [name] 08/29/2022 7:30 A M Medical Record Number: [id number] Patient Account Number: [id number] Date of Birth/Sex: Treating RN: 04/19/1965 (57 y.o. 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 M86.671 Other chronic osteomyelitis, right ankle and foot 07/12/2022 No Yes E10.621 Type 1 diabetes mellitus with foot ulcer 07/12/2022 No Yes Inactive Problems Resolved Problems Electronic Signature(s) Signed: 08/29/2022 3:13:59 PM By: Karl Bales EMT Signed: 08/29/2022 4:20:44 PM By: [name] DO Entered By: Karl Bales on 08/29/2022 15:13:59 -------------------------------------------------------------------------------- SuperBill Details Patient Name: Date of Service: [name] [name] 08/29/2022 Medical Record Number: [id number] Patient Account Number: [id number] Date of Birth/Sex: Treating RN: 1965/10/25 (57 y.o. Katrinka Blazing Primary Care Provider: [name] Other Clinician: Karl Bales Referring Provider: Treating Provider/Extender: [name], [name]: 6 Diagnosis Coding ICD-10 Codes Code Description [phone] Non-pressure chronic ulcer of other part of right foot with necrosis of bone M86.671 Other chronic osteomyelitis, right ankle and foot E10.621 Type 1 diabetes mellitus with foot ulcer Facility Procedures [name] [name], [name] [name] ([id number]): CPT4 Code Description Modifier [id number]-(Facility Use Only) HBOT full body chamber, ,  ICD-10 Diagnosis Description E10.621 Type 1 diabetes mellitus with foot ulcer L97.514 Non-pressure chronic ulcer of other part  of right foot with necrosis o M86.671 Other chronic osteomyelitis, right ankle and foot [id number] Page 2 of 2: Quantity 4 f bone Physician Procedures : CPT4 Code Description Modifier [phone] - WC PHYS HYPERBARIC OXYGEN THERAPY ICD-10 Diagnosis Description E10.621 Type 1 diabetes mellitus with foot ulcer 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

## 2022-08-29 NOTE — Progress Notes (Addendum)
[name], [name] ([id number]) [id number] Page 1 of 2 Visit Report for 08/29/2022 HBO Details Patient Name: Date of Service: [name] [name], [name] [name] 08/29/2022 7:30 A M Medical Record Number: [id number] Patient Account Number: [id number] Date of Birth/Sex: Treating RN: Jul 18, 1965 (57 y.o. [name] [name] Primary Care Daevon Holdren: [name] Other Clinician: Karl Bales Referring Maccoy Haubner: Treating Shaunta Oncale/Extender: [name], [name]: 6 HBO Treatment Course Details Treatment Course Number: 2 Ordering Elnoria Livingston: [name] T Treatments Ordered: otal 40 HBO Treatment Start Date: 07/18/2022 HBO Indication: Diabetic Ulcer(s) of the Lower Extremity Standard/Conservative Wound Care tried and failed greater than or equal to 30 days Wound #4 Right Calcaneus HBO Treatment Details Treatment Number: 28 Patient Type: Outpatient Chamber Type: Monoplace Chamber Serial #: [id number] Treatment Protocol: 2.0 ATA with 90 minutes oxygen, with two 5 minute air breaks Treatment Details Compression Rate Down: 2.0 psi / minute De-Compression Rate Up: 2.0 psi / minute A breaks and breathing ir Compress Tx Pressure periods Decompress Decompress Begins Reached (leave unused spaces Begins Ends blank) Chamber Pressure (ATA 1 2 2 2 2 2  --2 1 ) Clock Time (24 hr) 08:07 08:14 08:45 08:50 09:20 09:25 - - 09:55 10:03 Treatment Length: 116 (minutes) Treatment Segments: 4 Vital Signs Capillary Blood Glucose Reference Range: 80 - 120 mg / dl HBO Diabetic Blood Glucose Intervention Range: <131 mg/dl or >308 mg/dl Time Vitals Blood Respiratory Capillary Blood Glucose Pulse Action Type: Pulse: Temperature: Taken: Pressure: Rate: Glucose (mg/dl): Meter #: Oximetry (%) Taken: Pre 07:49 110/65 68 18 97.1 234 Post 10:05 150/76 68 18 97.1 133 Treatment Response Treatment Toleration: Well Treatment Completion Status: Treatment Completed without  Adverse Event Additional Procedure Documentation Tissue Sevierity: Necrosis of bone Physician HBO Attestation: I certify that I supervised this HBO treatment in accordance with Medicare guidelines. A trained emergency response team is readily available per Yes hospital policies and procedures. Continue HBOT as ordered. Yes Electronic Signature(s) Signed: 08/29/2022 4:20:44 PM By: [name] DO Previous Signature: 08/29/2022 2:54:06 PM Version By: Karl Bales EMT Entered By: [name] on 08/29/2022 16:17:32 [name] ([id number]) [id number] Page 2 of 2 -------------------------------------------------------------------------------- HBO Safety Checklist Details Patient Name: Date of Service: [name], [name] 08/29/2022 7:30 A M Medical Record Number: [id number] Patient Account Number: [id number] Date of Birth/Sex: Treating RN: Jul 16, 1965 (57 y.o. [name] [name] Primary Care Jaylan Hinojosa: [name] Other Clinician: Karl Bales Referring Hardy Harcum: Treating Carnetta Losada/Extender: [name], [name]: 6 HBO Safety Checklist Items Safety Checklist Consent Form Signed Patient voided / foley secured and emptied When did you last eato 0700 Last dose of injectable or oral agent 0730 Ostomy pouch emptied and vented if applicable NA All implantable devices assessed, documented and approved NA Intravenous access site secured and place NA Valuables secured Linens and cotton and cotton/polyester blend (less than 51% polyester) Personal oil-based products / skin lotions / body lotions removed Wigs or hairpieces removed Human Extensions approved Smoking or tobacco materials removed Books / newspapers / magazines / loose paper removed Cologne, aftershave, perfume and deodorant removed Jewelry removed (may wrap wedding band) Make-up removed Hair care products removed Battery operated devices (external)  removed Heating patches and chemical warmers removed Titanium eyewear removed NA Nail polish cured greater than 10 hours Over 10 hours old Casting material cured greater than 10 hours NA Hearing aids removed NA Loose dentures or partials removed NA Prosthetics have been removed NA Patient demonstrates correct use of air  break device (if applicable) Patient concerns have been addressed Patient grounding bracelet on and cord attached to chamber Specifics for Inpatients (complete in addition to above) Medication sheet sent with patient NA Intravenous medications needed or due during therapy sent with patient NA Drainage tubes (e.g. nasogastric tube or chest tube secured and vented) NA Endotracheal or Tracheotomy tube secured NA Cuff deflated of air and inflated with saline NA Airway suctioned NA Notes The safety checklist was done before the treatment was started. Electronic Signature(s) Signed: 08/29/2022 2:52:35 PM By: Karl Bales EMT Previous Signature: 08/29/2022 2:51:55 PM Version By: Karl Bales EMT Entered By: Karl Bales on 08/29/2022 14:52:35

## 2022-08-30 ENCOUNTER — Ambulatory Visit: Payer: Medicare Other | Admitting: Podiatry

## 2022-08-30 ENCOUNTER — Encounter (HOSPITAL_BASED_OUTPATIENT_CLINIC_OR_DEPARTMENT_OTHER): Payer: BC Managed Care – PPO | Admitting: General Surgery

## 2022-08-30 DIAGNOSIS — E10621 Type 1 diabetes mellitus with foot ulcer: Secondary | ICD-10-CM | POA: Diagnosis not present

## 2022-08-30 LAB — GLUCOSE, CAPILLARY
Glucose-Capillary: 202 mg/dL — ABNORMAL HIGH (ref 70–99)
Glucose-Capillary: 167 mg/dL — ABNORMAL HIGH (ref 70–99)
Glucose-Capillary: 202 mg/dL — ABNORMAL HIGH (ref 70–99)

## 2022-08-30 NOTE — Progress Notes (Signed)
[name], [name] ([id number]) [id number] Page 1 of 2 Visit Report for 08/30/2022 Problem List Details Patient Name: Date of Service: [name] [name], [name] [name] 08/30/2022 10:00 A M Medical Record Number: [id number] Patient Account Number: [id number] Date of Birth/Sex: Treating RN: 1965/04/01 (57 y.o. 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, right ankle and foot 07/12/2022 No Yes E10.621 Type 1 diabetes mellitus with foot ulcer 07/12/2022 No Yes Inactive Problems Resolved Problems Electronic Signature(s) Signed: 08/30/2022 4:24:33 PM By: Duanne Guess MD FACS Previous Signature: 08/30/2022 2:28:57 PM Version By: Karl Bales EMT Entered By: Duanne Guess on 08/30/2022 16:24:33 -------------------------------------------------------------------------------- SuperBill Details Patient Name: Date of Service: [name] [name] 08/30/2022 Medical Record Number: [id number] Patient Account Number: [id number] Date of Birth/Sex: Treating RN: [birth year] (57 y.o. [name], [name] Primary Care Provider: [name] Other Clinician: Karl Bales Referring Provider: Treating Provider/Extender: Valerie Roys GESH Weeks in Treatment: 7 Diagnosis Coding ICD-10 Codes Code Description [phone] Non-pressure chronic ulcer of other part of right foot with necrosis of bone M86.671 Other chronic osteomyelitis, right ankle and foot E10.621 Type 1 diabetes mellitus with foot ulcer Facility Procedures ELAF, CHELL ([id number]): CPT4 Code Description Modifier [id number]-(Facility Use Only) HBOT full  body chamber, , ICD-10 Diagnosis Description E10.621 Type 1 diabetes mellitus with foot ulcer L97.514 Non-pressure chronic ulcer of other part  of right foot with necrosis o M86.671 Other chronic osteomyelitis, right ankle and foot [id number] Page 2 of 2: Quantity 4 f bone Physician Procedures : CPT4 Code Description Modifier [phone] - WC PHYS HYPERBARIC OXYGEN THERAPY ICD-10 Diagnosis Description E10.621 Type 1 diabetes mellitus with foot ulcer 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

## 2022-08-30 NOTE — Progress Notes (Signed)
[name] [name], [name] [name] ([id number]) [id number] Page 1 of 2 Visit Report for 08/30/2022 Arrival Information Details Patient Name: Date of Service: [name] [name] 08/30/2022 10:00 A M Medical Record Number: [id number] Patient Account Number: [id number] Date of Birth/Sex: Treating RN: 11/12/1965 (57 y.o. [name] [name], [name] [name] Primary Care Samarion Ehle: [name] Other Clinician: Karl Bales Referring Shray Hunley: Treating Takenya Travaglini/Extender: [name], [name]: 7 Visit Information History Since Last Visit All ordered tests and consults were completed: Yes Patient Arrived: Wheel Chair Added or deleted any medications: No Arrival Time: 09:32 Any new allergies or adverse reactions: No Accompanied By: None Had a fall or experienced change in No Transfer Assistance: None activities of daily living that may affect Patient Identification Verified: Yes risk of falls: Secondary Verification Process Completed: Yes Signs or symptoms of abuse/neglect since last visito No Patient Requires Transmission-Based Precautions: No Hospitalized since last visit: No Patient Has Alerts: No Implantable device outside of the clinic excluding No cellular tissue based products placed in the center since last visit: Pain Present Now: No Electronic Signature(s) Signed: 08/30/2022 2:20:58 PM By: Karl Bales EMT Entered By: Karl Bales on 08/30/2022 14:20:58 -------------------------------------------------------------------------------- Encounter Discharge Information Details Patient Name: Date of Service: [name] Milo D. 08/30/2022 10:00 A M Medical Record Number: [id number] Patient Account Number: [id number] Date of Birth/Sex: Treating RN: [birth year] (57 y.o. [name] [name], [name] [name] Primary Care Tristine Langi: [name] Other Clinician: Karl Bales Referring Kyian Obst: Treating Ricci Dirocco/Extender: Caleen Essex GESH Weeks in Treatment:  7 Encounter Discharge Information Items Discharge Condition: Stable Ambulatory Status: Wheelchair Discharge Destination: Home Transportation: Private Auto Accompanied By: Clearence Cheek Schedule Follow-up Appointment: Yes Clinical Summary of Care: Electronic Signature(s) Signed: 08/30/2022 2:30:04 PM By: Karl Bales EMT Entered By: Karl Bales on 08/30/2022 14:30:04 [name] [name] ([id number]) [id number] Page 2 of 2 -------------------------------------------------------------------------------- Vitals Details Patient Name: Date of Service: [name] [name], [name] [name] 08/30/2022 10:00 A M Medical Record Number: [id number] Patient Account Number: [id number] Date of Birth/Sex: Treating RN: Aug 13, 1965 (57 y.o. [name] [name], [name] [name] Primary Care Yvette Roark: [name] Other Clinician: Karl Bales Referring Tanairi Cypert: Treating Gianella Chismar/Extender: [name], [name]: 7 Vital Signs Time Taken: 09:59 Temperature (F): 97.4 Height (in): 65 Pulse (bpm): 72 Weight (lbs): 105 Respiratory Rate (breaths/min): 18 Body Mass Index (BMI): 17.5 Blood Pressure (mmHg): 95/61 Capillary Blood Glucose (mg/dl): 244 Reference Range: 80 - 120 mg / dl Electronic Signature(s) Signed: 08/30/2022 2:21:41 PM By: Karl Bales EMT Entered By: Karl Bales on 08/30/2022 14:21:41

## 2022-08-30 NOTE — Progress Notes (Addendum)
[name], [name] ([id number]) [id number] Page 1 of 2 Visit Report for 08/30/2022 HBO Details Patient Name: Date of Service: [name] [name], [name] [name] 08/30/2022 10:00 A M Medical Record Number: [id number] Patient Account Number: [id number] Date of Birth/Sex: Treating RN: [birth year] (57 y.o. [name], [name] Primary Care Ara Grandmaison: [name] Other Clinician: Karl Bales Referring Priyanka Causey: Treating Kadiatou Oplinger/Extender: Valerie Roys GESH Weeks in Treatment: 7 HBO Treatment Course Details Treatment Course Number: 2 Ordering Mega Kinkade: [name] T Treatments Ordered: otal 40 HBO Treatment Start Date: 07/18/2022 HBO Indication: Diabetic Ulcer(s) of the Lower Extremity Standard/Conservative Wound Care tried and failed greater than or equal to 30 days Wound #4 Right Calcaneus HBO Treatment Details Treatment Number: 29 Patient Type: Outpatient Chamber Type: Monoplace Chamber Serial #: [id number] Treatment Protocol: 2.0 ATA with 90 minutes oxygen, with two 5 minute air breaks Treatment Details Compression Rate Down: 2.0 psi / minute De-Compression Rate Up: 2.0 psi / minute A breaks and breathing ir Compress Tx Pressure periods Decompress Decompress Begins Reached (leave unused spaces Begins Ends blank) Chamber Pressure (ATA 1 2 2 2 2 2  --2 1 ) Clock Time (24 hr) 10:59 11:07 11:37 11:42 12:12 12:17 - - 12:48 12:56 Treatment Length: 117 (minutes) Treatment Segments: 4 Vital Signs Capillary Blood Glucose Reference Range: 80 - 120 mg / dl HBO Diabetic Blood Glucose Intervention Range: <131 mg/dl or >962 mg/dl Type: Time Vitals Blood Pulse: Respiratory Temperature: Capillary Blood Glucose Pulse Action Taken: Pressure: Rate: Glucose (mg/dl): Meter #: Oximetry (%) Taken: Pre 09:59 95/61 72 18 97.4 202 Dr. Lady Gary informed of blood pressure Post 13:01 139/80 72 18 97.3 202 Treatment Response Treatment Toleration: Well Treatment Completion Status:  Treatment Completed without Adverse Event Treatment Notes Dr. Lady Gary informed of blood pressure. Additional Procedure Documentation Tissue Sevierity: Necrosis of muscle Physician HBO Attestation: I certify that I supervised this HBO treatment in accordance with Medicare guidelines. A trained emergency response team is readily available per Yes hospital policies and procedures. Continue HBOT as ordered. Yes Electronic Signature(s) Signed: 08/30/2022 4:25:01 PM By: Duanne Guess MD FACS Previous Signature: 08/30/2022 2:28:29 PM Version By: Karl Bales EMT Entered By: Duanne Guess on 08/30/2022 16:25:01 [name] ([id number]) [id number] Page 2 of 2 -------------------------------------------------------------------------------- HBO Safety Checklist Details Patient Name: Date of Service: [name], [name] 08/30/2022 10:00 A M Medical Record Number: [id number] Patient Account Number: [id number] Date of Birth/Sex: Treating RN: 25-Nov-1965 (57 y.o. [name], [name] Primary Care Eino Whitner: [name] Other Clinician: Karl Bales Referring Bathsheba Durrett: Treating Leaha Cuervo/Extender: Kennedy Bucker, [name]: 7 HBO Safety Checklist Items Safety Checklist Consent Form Signed Patient voided / foley secured and emptied When did you last eato 0800 Last dose of injectable or oral agent 0800 Ostomy pouch emptied and vented if applicable NA All implantable devices assessed, documented and approved NA Intravenous access site secured and place NA Valuables secured Linens and cotton and cotton/polyester blend (less than 51% polyester) Personal oil-based products / skin lotions / body lotions removed Wigs or hairpieces removed Human Extensions approved Smoking or tobacco materials removed Books / newspapers / magazines / loose paper removed Cologne, aftershave, perfume and deodorant removed Jewelry removed (may wrap wedding  band) Make-up removed Hair care products removed Battery operated devices (external) removed Libre3 Heating patches and chemical warmers removed Titanium eyewear removed NA Nail polish cured greater than 10 hours Over 10 hours old Casting material cured greater than 10 hours NA Hearing aids removed NA Loose  dentures or partials removed NA Prosthetics have been removed NA Patient demonstrates correct use of air break device (if applicable) Patient concerns have been addressed Patient grounding bracelet on and cord attached to chamber Specifics for Inpatients (complete in addition to above) Medication sheet sent with patient NA Intravenous medications needed or due during therapy sent with patient NA Drainage tubes (e.g. nasogastric tube or chest tube secured and vented) NA Endotracheal or Tracheotomy tube secured NA Cuff deflated of air and inflated with saline NA Airway suctioned NA Notes The safety checklist was done before the treatment was started. Electronic Signature(s) Signed: 08/30/2022 4:27:21 PM By: Duanne Guess MD FACS Previous Signature: 08/30/2022 2:23:04 PM Version By: Karl Bales EMT Entered By: Duanne Guess on 08/30/2022 16:27:21

## 2022-08-31 ENCOUNTER — Encounter (HOSPITAL_BASED_OUTPATIENT_CLINIC_OR_DEPARTMENT_OTHER): Payer: BC Managed Care – PPO | Admitting: Physician Assistant

## 2022-08-31 DIAGNOSIS — E10621 Type 1 diabetes mellitus with foot ulcer: Secondary | ICD-10-CM | POA: Diagnosis not present

## 2022-08-31 LAB — GLUCOSE, CAPILLARY
Glucose-Capillary: 224 mg/dL — ABNORMAL HIGH (ref 70–99)
Glucose-Capillary: 185 mg/dL — ABNORMAL HIGH (ref 70–99)

## 2022-08-31 NOTE — Progress Notes (Addendum)
[name] [name] ([id number]) [id number] Page 1 of 2 Visit Report for 08/31/2022 HBO Details Patient Name: Date of Service: [name], [name] 08/31/2022 10:00 A M Medical Record Number: [id number] Patient Account Number: [id number] Date of Birth/Sex: Treating RN: 1965/08/26 (57 y.o. [name] [name] Primary Care [name] [name]: [name] Other Clinician: Haywood [name] Referring [name] [name]: Treating Korbyn Vanes/Extender: Alford Highland, [name]: 7 HBO Treatment Course Details Treatment Course Number: 2 Ordering [name] [name]: [name] [name] Treatments Ordered: otal 40 HBO Treatment Start Date: 07/18/2022 HBO Indication: Diabetic Ulcer(s) of the Lower Extremity Standard/Conservative Wound Care tried and failed greater than or equal to 30 days Wound #4 Right Calcaneus HBO Treatment Details Treatment Number: 30 Patient Type: Outpatient Chamber Type: Monoplace Chamber Serial #: [id number] Treatment Protocol: 2.0 ATA with 90 minutes oxygen, with two 5 minute air breaks Treatment Details Compression Rate Down: 1.5 psi / minute De-Compression Rate Up: 2.0 psi / minute A breaks and breathing ir Compress Tx Pressure periods Decompress Decompress Begins Reached (leave unused spaces Begins Ends blank) Chamber Pressure (ATA 1 2 2 2 2 2  --2 1 ) Clock Time (24 hr) 09:59 10:09 10:39 10:45 11:15 11:20 - - 11:50 12:00 Treatment Length: 121 (minutes) Treatment Segments: 4 Vital Signs Capillary Blood Glucose Reference Range: 80 - 120 mg / dl HBO Diabetic Blood Glucose Intervention Range: <131 mg/dl or >542 mg/dl Type: Time Vitals Blood Respiratory Capillary Blood Glucose Pulse Action Pulse: Temperature: Taken: Pressure: Rate: Glucose (mg/dl): Meter #: Oximetry (%) Taken: Pre 09:32 93/68 75 18 97.2 224 2 none per protocol Post 12:07 139/73 74 18 97.2 185 1 none per protocol Treatment Response Treatment Toleration: Well Treatment  Completion Status: Treatment Completed without Adverse Event Treatment Notes Mrs. [name] [name] arrived with normal vital signs. Blood Glucose Level was 224 mg/dL. She ate a breakfast. Safety check was completed. Patient was placed in the chamber which was compressed with 100% oxygen at a rate of 2 psi/min after confirming normal ear equalization. She tolerated treatment and subsequent decompression at 2 psi/min. She denied any issues with ear equalization and/or pain associated with barotraumaPost-treatment glucose was 185 mg/dL. Other post-treatment vital signs were within range. The patient was stable upon discharge with her driver. Additional Procedure Documentation Tissue Sevierity: Necrosis of bone Electronic Signature(s) Signed: 08/31/2022 1:50:32 PM By: [name] [name] CHT EMT BS , , Signed: 08/31/2022 3:56:42 PM By: [name] Derry PA-C Previous Signature: 08/31/2022 1:32:12 PM Version By: [name] [name] CHT EMT BS , , Entered By: [name] [name] on 08/31/2022 13:50:32 [name] [name] ([id number]) [id number] Page 2 of 2 -------------------------------------------------------------------------------- HBO Safety Checklist Details Patient Name: Date of Service: [name], [name] 08/31/2022 10:00 A M Medical Record Number: [id number] Patient Account Number: [id number] Date of Birth/Sex: Treating RN: 1965/05/01 (57 y.o. [name] [name] Primary Care [name] [name]: [name] Other Clinician: Karl Bales Referring Fernand Sorbello: Treating Arvle Grabe/Extender: Alford Highland, [name]: 7 HBO Safety Checklist Items Safety Checklist Consent Form Signed Patient voided / foley secured and emptied When did you last eato 0800 Last dose of injectable or oral agent 0800 Ostomy pouch emptied and vented if applicable NA All implantable devices assessed, documented and approved NA Intravenous access site secured and place NA Valuables  secured Linens and cotton and cotton/polyester blend (less than 51% polyester) Personal oil-based products / skin lotions / body lotions removed Wigs or hairpieces removed NA Smoking or tobacco materials removed NA Books / newspapers / magazines /  loose paper removed Cologne, aftershave, perfume and deodorant removed Jewelry removed (may wrap wedding band) Make-up removed Hair care products removed Human Hair extensions approved Libre 3 Approved, otherwise all electronics Battery operated devices (external) removed removed. Heating patches and chemical warmers removed Titanium eyewear removed Nail polish cured greater than 10 hours NA Casting material cured greater than 10 hours NA Hearing aids removed NA Loose dentures or partials removed NA Prosthetics have been removed NA Patient demonstrates correct use of air break device (if applicable) Patient concerns have been addressed Patient grounding bracelet on and cord attached to chamber Specifics for Inpatients (complete in addition to above) Medication sheet sent with patient NA Intravenous medications needed or due during therapy sent with patient NA Drainage tubes (e.g. nasogastric tube or chest tube secured and vented) NA Endotracheal or Tracheotomy tube secured NA Cuff deflated of air and inflated with saline NA Airway suctioned NA Notes Paper version used prior to treatment start. Electronic Signature(s) Signed: 08/31/2022 1:23:24 PM By: [name] [name] CHT EMT BS , , Entered By: [name] [name] on 08/31/2022 04:54:09

## 2022-08-31 NOTE — Progress Notes (Signed)
[name], [name] ([id number]) [id number] Page 1 of 2 Visit Report for 08/31/2022 Arrival Information Details Patient Name: Date of Service: [name] [name], [name] [name] 08/31/2022 10:00 A M Medical Record Number: [id number] Patient Account Number: [id number] Date of Birth/Sex: Treating RN: 24-Oct-1965 (57 y.o. [name] Primary Care Madeeha Costantino: [name] Other Clinician: Karl Bales Referring Berneta Sconyers: Treating Lemma Tetro/Extender: Alford Highland, [name]: 7 Visit Information History Since Last Visit All ordered tests and consults were completed: Yes Patient Arrived: Wheel Chair Added or deleted any medications: No Arrival Time: 09:27 Any new allergies or adverse reactions: No Accompanied By: driver Had a fall or experienced change in No Transfer Assistance: Other activities of daily living that may affect Patient Identification Verified: Yes risk of falls: Secondary Verification Process Completed: Yes Signs or symptoms of abuse/neglect since last visito No Patient Requires Transmission-Based Precautions: No Hospitalized since last visit: No Patient Has Alerts: No Implantable device outside of the clinic excluding No cellular tissue based products placed in the center since last visit: Pain Present Now: No Electronic Signature(s) Signed: 08/31/2022 1:19:11 PM By: Haywood Pao CHT EMT BS , , Entered By: Haywood Pao on 08/31/2022 13:19:11 -------------------------------------------------------------------------------- Encounter Discharge Information Details Patient Name: Date of Service: [name] Milo D. 08/31/2022 10:00 A M Medical Record Number: [id number] Patient Account Number: [id number] Date of Birth/Sex: Treating RN: 02/23/1966 (57 y.o. [name] Primary Care Amelita Risinger: [name] Other Clinician: Haywood Pao Referring Shahrukh Pasch: Treating Aziel Morgan/Extender: Alford Highland,  [name]: 7 Encounter Discharge Information Items Discharge Condition: Stable Ambulatory Status: Wheelchair Discharge Destination: Home Transportation: Private Auto Accompanied By: self Schedule Follow-up Appointment: No Clinical Summary of Care: Electronic Signature(s) Signed: 08/31/2022 1:51:00 PM By: Haywood Pao CHT EMT BS , , Entered By: Haywood Pao on 08/31/2022 13:51:00 [name] ([id number]) [id number] Page 2 of 2 -------------------------------------------------------------------------------- Vitals Details Patient Name: Date of Service: [name] [name] 08/31/2022 10:00 A M Medical Record Number: [id number] Patient Account Number: [id number] Date of Birth/Sex: Treating RN: 1965-09-30 (57 y.o. [name] Primary Care Regis Wiland: [name] Other Clinician: Karl Bales Referring Jacquise Rarick: Treating Dawnette Mione/Extender: Alford Highland, [name]: 7 Vital Signs Time Taken: 09:32 Temperature (F): 97.2 Height (in): 65 Pulse (bpm): 75 Weight (lbs): 105 Respiratory Rate (breaths/min): 18 Body Mass Index (BMI): 17.5 Blood Pressure (mmHg): 93/68 Capillary Blood Glucose (mg/dl): 366 Reference Range: 80 - 120 mg / dl Electronic Signature(s) Signed: 08/31/2022 1:19:57 PM By: Haywood Pao CHT EMT BS , , Entered By: Haywood Pao on 08/31/2022 13:19:56

## 2022-08-31 NOTE — Progress Notes (Signed)
[name], [name] ([id number]) [id number] Page 1 of 1 Visit Report for 08/31/2022 SuperBill Details Patient Name: Date of Service: [name] [name], [name] [name] 08/31/2022 Medical Record Number: [id number] Patient Account Number: [id number] Date of Birth/Sex: Treating RN: 1965/07/21 (57 y.o. 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 Other chronic osteomyelitis, right ankle and foot E10.621 Type 1 diabetes mellitus with foot ulcer Facility Procedures CPT4 Code Description Modifier Quantity [id number] G0277-(Facility Use Only) HBOT full body chamber, , 4 ICD-10 Diagnosis Description E10.621 Type 1 diabetes mellitus with foot ulcer L97.514 Non-pressure chronic ulcer of other part of right foot with necrosis of bone M86.671 Other chronic osteomyelitis, right ankle and foot Physician Procedures Quantity CPT4 Code Description Modifier [id number] 99183 - WC PHYS HYPERBARIC OXYGEN THERAPY 1 ICD-10 Diagnosis Description E10.621 Type 1 diabetes mellitus with foot ulcer L97.514 Non-pressure chronic ulcer of other part of right foot with necrosis of bone M86.671 Other chronic osteomyelitis, right ankle and foot Electronic Signature(s) Signed: 08/31/2022 1:49:57 PM By: Haywood Pao CHT EMT BS , , Signed: 08/31/2022 3:56:42 PM By: Allen Derry PA-C Entered By: Haywood Pao on 08/31/2022 13:49:57

## 2022-09-02 ENCOUNTER — Encounter (HOSPITAL_BASED_OUTPATIENT_CLINIC_OR_DEPARTMENT_OTHER): Payer: BC Managed Care – PPO | Admitting: Internal Medicine

## 2022-09-02 DIAGNOSIS — E10621 Type 1 diabetes mellitus with foot ulcer: Secondary | ICD-10-CM | POA: Diagnosis not present

## 2022-09-02 DIAGNOSIS — L97514 Non-pressure chronic ulcer of other part of right foot with necrosis of bone: Secondary | ICD-10-CM | POA: Diagnosis not present

## 2022-09-02 DIAGNOSIS — M86671 Other chronic osteomyelitis, right ankle and foot: Secondary | ICD-10-CM | POA: Diagnosis not present

## 2022-09-02 LAB — GLUCOSE, CAPILLARY
Glucose-Capillary: 242 mg/dL — ABNORMAL HIGH (ref 70–99)
Glucose-Capillary: 62 mg/dL — ABNORMAL LOW (ref 70–99)

## 2022-09-02 NOTE — Progress Notes (Addendum)
[name] [name], [name] [name] ([id number]) [id number] Page 1 of 2 Visit Report for 09/02/2022 Arrival Information Details Patient Name: Date of Service: [name] [name], [name] [name] 09/02/2022 7:30 A M Medical Record Number: [id number] Patient Account Number: [id number] Date of Birth/Sex: Treating RN: 1965-04-29 (57 y.o. [name] [name], [name] [name] Primary Care Tel Hevia: [name] Other Clinician: Karl Bales Referring Jmya Uliano: Treating Johsua Shevlin/Extender: [name], [name]: 7 Visit Information History Since Last Visit All ordered tests and consults were completed: Yes Patient Arrived: Wheel Chair Added or deleted any medications: No Arrival Time: 07:34 Any new allergies or adverse reactions: No Accompanied By: Spouse Had a fall or experienced change in No Transfer Assistance: None activities of daily living that may affect Patient Identification Verified: Yes risk of falls: Secondary Verification Process Completed: Yes Signs or symptoms of abuse/neglect since last visito No Patient Requires Transmission-Based Precautions: No Hospitalized since last visit: No Patient Has Alerts: No Implantable device outside of the clinic excluding No cellular tissue based products placed in the center since last visit: Pain Present Now: No Electronic Signature(s) Signed: 09/02/2022 11:14:49 AM By: Haywood Pao CHT EMT BS , , Entered By: Haywood Pao on 09/02/2022 11:14:49 -------------------------------------------------------------------------------- Encounter Discharge Information Details Patient Name: Date of Service: [name] Milo D. 09/02/2022 7:30 A M Medical Record Number: [id number] Patient Account Number: [id number] Date of Birth/Sex: Treating RN: August 27, 1965 (57 y.o. [name] [name], [name] [name] Primary Care Arney Mayabb: [name] Other Clinician: Karl Bales Referring Emmalia Heyboer: Treating Gervase Colberg/Extender: [name], YO  GESH Weeks in Treatment: 7 Encounter Discharge Information Items Discharge Condition: Stable Ambulatory Status: Wheelchair Discharge Destination: Home Transportation: Private Auto Accompanied By: Husband Schedule Follow-up Appointment: Yes Clinical Summary of Care: Electronic Signature(s) Signed: 09/02/2022 11:49:09 AM By: Karl Bales EMT Entered By: Karl Bales on 09/02/2022 11:49:09 [name] [name] ([id number]) [id number] Page 2 of 2 -------------------------------------------------------------------------------- Vitals Details Patient Name: Date of Service: [name] [name], [name] [name] 09/02/2022 7:30 A M Medical Record Number: [id number] Patient Account Number: [id number] Date of Birth/Sex: Treating RN: 1965-10-04 (57 y.o. [name] [name], [name] [name] Primary Care Redell Bhandari: [name] Other Clinician: Karl Bales Referring Ritamarie Arkin: Treating Mireille Lacombe/Extender: [name], [name]: 7 Vital Signs Time Taken: 07:49 Temperature (F): 97.5 Height (in): 65 Pulse (bpm): 70 Weight (lbs): 105 Respiratory Rate (breaths/min): 18 Body Mass Index (BMI): 17.5 Blood Pressure (mmHg): 134/86 Capillary Blood Glucose (mg/dl): 244 Reference Range: 80 - 120 mg / dl Electronic Signature(s) Signed: 09/02/2022 11:15:24 AM By: Haywood Pao CHT EMT BS , , Entered By: Haywood Pao on 09/02/2022 11:15:24

## 2022-09-02 NOTE — Progress Notes (Signed)
SOMTOCHUKWU, MAHALA ([id number]) [id number] Page 1 of 2 Visit Report for 09/02/2022 Problem List Details Patient Name: Date of Service: [name] [name], [name] [name] 09/02/2022 7:30 A M Medical Record Number: [id number] Patient Account Number: [id number] Date of Birth/Sex: Treating RN: 01/27/1966 (57 y.o. [name] [name], [name] [name] Primary Care Provider: [name] Other Clinician: Karl [name] Referring Provider: Treating Provider/Extender: [name], [name]: 7 Active Problems ICD-10 Encounter Code Description Active Date MDM Diagnosis L97.514 Non-pressure chronic ulcer of other part of right foot with 07/12/2022 No Yes necrosis of bone M86.671 Other chronic osteomyelitis, right ankle and foot 07/12/2022 No Yes E10.621 Type 1 diabetes mellitus with foot ulcer 07/12/2022 No Yes Inactive Problems Resolved Problems Electronic Signature(s) Signed: 09/02/2022 11:48:30 AM By: [name] [name] EMT Signed: 09/02/2022 12:48:40 PM By: [name] Corwin DO Entered By: [name] [name] on 09/02/2022 11:48:30 -------------------------------------------------------------------------------- SuperBill Details Patient Name: Date of Service: [name] [name] 09/02/2022 Medical Record Number: [id number] Patient Account Number: [id number] Date of Birth/Sex: Treating RN: September 05, 1965 (57 y.o. 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 Other chronic osteomyelitis, right ankle and foot E10.621 Type 1 diabetes mellitus with foot ulcer Facility Procedures AASHRITHA, BRISSETT ([id number]): CPT4 Code Description Modifier [id number] G0277-(Facility Use Only) HBOT full body chamber, ,  ICD-10 Diagnosis Description L97.514 Non-pressure chronic ulcer of other part of right foot with necrosis o M86.671 Other  chronic osteomyelitis, right ankle and foot E10.621 Type 1 diabetes mellitus with foot ulcer [id number] Page 2 of 2: Quantity 4 f bone Physician Procedures : CPT4 Code Description Modifier [id number] 415 725 2403 - WC PHYS HYPERBARIC OXYGEN THERAPY ICD-10 Diagnosis Description L97.514 Non-pressure chronic ulcer of other part of right foot with necrosis o M86.671 Other chronic osteomyelitis, right ankle and foot  E10.621 Type 1 diabetes mellitus with foot ulcer Quantity: 1 f bone Electronic Signature(s) Signed: 09/02/2022 11:46:34 AM By: [name] [name] EMT Signed: 09/02/2022 12:48:40 PM By: [name] Corwin DO Entered By: [name] [name] on 09/02/2022 11:46:34

## 2022-09-02 NOTE — Progress Notes (Addendum)
PAETON, BAYES ([id number]) [id number] Page 1 of 2 Visit Report for 09/02/2022 HBO Details Patient Name: Date of Service: [name] [name], [name] [name] 09/02/2022 7:30 A M Medical Record Number: [id number] Patient Account Number: [id number] Date of Birth/Sex: Treating RN: 11-Nov-1965 (57 y.o. [name] [name], [name] Primary Care Shreyas Piatkowski: [name] Other Clinician: Karl Bales Referring Brion Sossamon: Treating Kinsler Soeder/Extender: [name], [name]: 7 HBO Treatment Course Details Treatment Course Number: 2 Ordering Algenis Ballin: [name] T Treatments Ordered: otal 40 HBO Treatment Start Date: 07/18/2022 HBO Indication: Diabetic Ulcer(s) of the Lower Extremity Standard/Conservative Wound Care tried and failed greater than or equal to 30 days Wound #4 Right Calcaneus HBO Treatment Details Treatment Number: 31 Patient Type: Outpatient Chamber Type: Monoplace Chamber Serial #: [id number] Treatment Protocol: 2.0 ATA with 90 minutes oxygen, with two 5 minute air breaks Treatment Details Compression Rate Down: 2.0 psi / minute De-Compression Rate Up: 2.0 psi / minute A breaks and breathing ir Compress Tx Pressure periods Decompress Decompress Begins Reached (leave unused spaces Begins Ends blank) Chamber Pressure (ATA 1 2 2 2 2 2  --2 1 ) Clock Time (24 hr) 08:08 08:18 08:45 08:54 09:24 09:28 - - 08:59 10:06 Treatment Length: 118 (minutes) Treatment Segments: 4 Vital Signs Capillary Blood Glucose Reference Range: 80 - 120 mg / dl HBO Diabetic Blood Glucose Intervention Range: <131 mg/dl or >782 mg/dl Type: Time Vitals Blood Pulse: Respiratory Temperature: Capillary Blood Glucose Pulse Action Taken: Pressure: Rate: Glucose (mg/dl): Meter #: Oximetry (%) Taken: Pre 07:49 134/86 70 18 97.5 242 Post 10:14 159/91 73 20 98.1 62 Patient given 8 oz of Glucerna with 8 oz juice Treatment Response Treatment Toleration: Well Treatment Completion  Status: Treatment Completed without Adverse Event Treatment Notes Post treatment blood sugar was 62. The patient was given 8 oz of Glucerna with 8 oz of juice to drink. She waited 15 min before leaving. She stated that she felt fine. Dr. Mikey Bussing informed. Additional Procedure Documentation Tissue Sevierity: Necrosis of muscle Physician HBO Attestation: I certify that I supervised this HBO treatment in accordance with Medicare guidelines. A trained emergency response team is readily available per Yes hospital policies and procedures. Continue HBOT as ordered. Yes Electronic Signature(s) Signed: 09/05/2022 12:25:26 PM By: [name] DO Previous Signature: 09/02/2022 11:48:22 AM Version By: Karl Bales EMT Previous Signature: 09/02/2022 12:48:40 PM Version By: [name] DO Previous Signature: 09/02/2022 11:46:09 AM Version By: Solmon Ice ([id number]) [id number] Page 2 of 2 Previous Signature: 09/02/2022 11:46:09 AM Version By: Karl Bales EMT Entered By: [name] on 09/05/2022 12:21:38 -------------------------------------------------------------------------------- HBO Safety Checklist Details Patient Name: Date of Service: [name] [name] 09/02/2022 7:30 A M Medical Record Number: [id number] Patient Account Number: [id number] Date of Birth/Sex: Treating RN: 1966/02/15 (57 y.o. [name] [name], [name] Primary Care Torre Pikus: [name] Other Clinician: Karl Bales Referring Neva Ramaswamy: Treating Aiyla Baucom/Extender: [name], [name]: 7 HBO Safety Checklist Items Safety Checklist Consent Form Signed Patient voided / foley secured and emptied When did you last eato 0700 Last dose of injectable or oral agent 0730 Ostomy pouch emptied and vented if applicable NA All implantable devices assessed, documented and approved NA Intravenous access site secured and place NA Valuables  secured Linens and cotton and cotton/polyester blend (less than 51% polyester) Personal oil-based products / skin lotions / body lotions removed Wigs or hairpieces removed NA Smoking or tobacco materials removed NA Books / newspapers / magazines /  loose paper removed Cologne, aftershave, perfume and deodorant removed Jewelry removed (may wrap wedding band) Make-up removed Hair care products removed Human Hair extensions approved Libre 3 Approved, otherwise all electronics Battery operated devices (external) removed removed. Heating patches and chemical warmers removed Titanium eyewear removed Nail polish cured greater than 10 hours greater than 10 hours Casting material cured greater than 10 hours NA Hearing aids removed NA Loose dentures or partials removed NA Prosthetics have been removed NA Patient demonstrates correct use of air break device (if applicable) Patient concerns have been addressed Patient grounding bracelet on and cord attached to chamber Specifics for Inpatients (complete in addition to above) Medication sheet sent with patient NA Intravenous medications needed or due during therapy sent with patient NA Drainage tubes (e.g. nasogastric tube or chest tube secured and vented) NA Endotracheal or Tracheotomy tube secured NA Cuff deflated of air and inflated with saline NA Airway suctioned NA Notes Paper version used prior to treatment start. Electronic Signature(s) Signed: 09/02/2022 11:16:51 AM By: Haywood Pao CHT EMT BS , , Entered By: Haywood Pao on 09/02/2022 11:16:50

## 2022-09-05 ENCOUNTER — Encounter (HOSPITAL_BASED_OUTPATIENT_CLINIC_OR_DEPARTMENT_OTHER): Payer: BC Managed Care – PPO | Admitting: Internal Medicine

## 2022-09-05 DIAGNOSIS — E10621 Type 1 diabetes mellitus with foot ulcer: Secondary | ICD-10-CM

## 2022-09-05 DIAGNOSIS — M86671 Other chronic osteomyelitis, right ankle and foot: Secondary | ICD-10-CM

## 2022-09-05 DIAGNOSIS — L97514 Non-pressure chronic ulcer of other part of right foot with necrosis of bone: Secondary | ICD-10-CM | POA: Diagnosis not present

## 2022-09-05 LAB — GLUCOSE, CAPILLARY
Glucose-Capillary: 338 mg/dL — ABNORMAL HIGH (ref 70–99)
Glucose-Capillary: 415 mg/dL — ABNORMAL HIGH (ref 70–99)
Glucose-Capillary: 143 mg/dL — ABNORMAL HIGH (ref 70–99)

## 2022-09-05 NOTE — Progress Notes (Addendum)
[name], [name] ([id number]) [id number] Page 1 of 2 Visit Report for 09/05/2022 HBO Details Patient Name: Date of Service: [name] [name] 09/05/2022 7:30 A M Medical Record Number: [id number] Patient Account Number: [id number] Date of Birth/Sex: Treating RN: Sep 09, 1965 (57 y.o. [name], [name] Primary Care Rehanna Oloughlin: [name] Other Clinician: Karl Bales Referring Aaronjames Kelsay: Treating Vlad Mayberry/Extender: Caleen Essex GESH Weeks in Treatment: 7 HBO Treatment Course Details Treatment Course Number: 2 Ordering Mykeria Garman: [name] T Treatments Ordered: otal 40 HBO Treatment Start Date: 07/18/2022 HBO Indication: Diabetic Ulcer(s) of the Lower Extremity Standard/Conservative Wound Care tried and failed greater than or equal to 30 days Wound #4 Right Calcaneus HBO Treatment Details Treatment Number: 32 Patient Type: Outpatient Chamber Type: Monoplace Chamber Serial #: [id number] Treatment Protocol: 2.0 ATA with 90 minutes oxygen, with two 5 minute air breaks Treatment Details Compression Rate Down: 1.0 psi / minute De-Compression Rate Up: 2.0 psi / minute A breaks and breathing ir Compress Tx Pressure periods Decompress Decompress Begins Reached (leave unused spaces Begins Ends blank) Chamber Pressure (ATA 1 2 2 2 2 2  --2 1 ) Clock Time (24 hr) 08:10 08:26 08:56 09:01 09:31 09:35 - - 10:06 10:13 Treatment Length: 123 (minutes) Treatment Segments: 4 Vital Signs Capillary Blood Glucose Reference Range: 80 - 120 mg / dl HBO Diabetic Blood Glucose Intervention Range: <131 mg/dl or >782 mg/dl Time Vitals Blood Respiratory Capillary Blood Glucose Pulse Action Type: Pulse: Temperature: Taken: Pressure: Rate: Glucose (mg/dl): Meter #: Oximetry (%) Taken: Pre 07:42 415 Post 10:15 119/91 75 18 97.3 143 Pre 08:03 122/74 79 18 97.3 338 Treatment Response Treatment Toleration: Well Treatment Completion Status: Treatment  Completed without Adverse Event Treatment Notes On arrival the patient stated that she took her insulin late this morning. Dr. Lady Gary was informed. Arrival blood sugar was 415 and was rechecked before treatment was started. Patient blood sugar was 338. Dr. Lady Gary informed. No problems were noted during treatment. Additional Procedure Documentation Tissue Sevierity: Necrosis of bone Physician HBO Attestation: I certify that I supervised this HBO treatment in accordance with Medicare guidelines. A trained emergency response team is readily available per Yes hospital policies and procedures. Continue HBOT as ordered. Yes Electronic Signature(s) Signed: 09/05/2022 4:36:37 PM By: [name] DO Previous Signature: 09/05/2022 1:44:55 PM Version By: Dolores Patty D ([id number]) [id number] Page 2 of 2 Previous Signature: 09/05/2022 1:44:55 PM Version By: Karl Bales EMT Entered By: [name] on 09/05/2022 16:35:05 -------------------------------------------------------------------------------- HBO Safety Checklist Details Patient Name: Date of Service: [name], [name] 09/05/2022 7:30 A M Medical Record Number: [id number] Patient Account Number: [id number] Date of Birth/Sex: Treating RN: 1965-04-04 (57 y.o. [name], [name] Primary Care Keyshaun Exley: [name] Other Clinician: Karl Bales Referring [place] Boyett: Treating Rahi Chandonnet/Extender: [name], [name]: 7 HBO Safety Checklist Items Safety Checklist Consent Form Signed Patient voided / foley secured and emptied When did you last eato 0730 Last dose of injectable or oral agent 0745 Ostomy pouch emptied and vented if applicable NA All implantable devices assessed, documented and approved NA Intravenous access site secured and place NA Valuables secured Linens and cotton and cotton/polyester blend (less than 51% polyester) Personal oil-based  products / skin lotions / body lotions removed Wigs or hairpieces removed Human Extensions approved Smoking or tobacco materials removed Books / newspapers / magazines / loose paper removed Cologne, aftershave, perfume and deodorant removed Jewelry removed (may wrap wedding band) Make-up removed Hair  care products removed Battery operated devices (external) removed Libre3 Heating patches and chemical warmers removed Titanium eyewear removed NA Nail polish cured greater than 10 hours Over 10 hours old Haematologist cured greater than 10 hours NA Hearing aids removed NA Loose dentures or partials removed NA Prosthetics have been removed NA Patient demonstrates correct use of air break device (if applicable) Patient concerns have been addressed Patient grounding bracelet on and cord attached to chamber Specifics for Inpatients (complete in addition to above) Medication sheet sent with patient NA Intravenous medications needed or due during therapy sent with patient NA Drainage tubes (e.g. nasogastric tube or chest tube secured and vented) NA Endotracheal or Tracheotomy tube secured NA Cuff deflated of air and inflated with saline NA Airway suctioned NA Notes The safety checklist was done before the patient treatment was started. Electronic Signature(s) Signed: 09/05/2022 1:38:15 PM By: Karl Bales EMT Entered By: Karl Bales on 09/05/2022 13:38:15

## 2022-09-05 NOTE — Progress Notes (Signed)
[name] [name], [name] [name] ([id number]) [id number] Page 1 of 2 Visit Report for 09/05/2022 Problem List Details Patient Name: Date of Service: [name] [name], [name] [name] 09/05/2022 7:30 A M Medical Record Number: [id number] Patient Account Number: [id number] Date of Birth/Sex: Treating RN: Jul 14, 1965 (56 y.o. 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 Other chronic osteomyelitis, right ankle and foot 07/12/2022 No Yes E10.621 Type 1 diabetes mellitus with foot ulcer 07/12/2022 No Yes Inactive Problems Resolved Problems Electronic Signature(s) Signed: 09/05/2022 1:45:24 PM By: Karl Bales EMT Signed: 09/05/2022 4:36:37 PM By: [name] DO Entered By: Karl Bales on 09/05/2022 13:45:23 -------------------------------------------------------------------------------- SuperBill Details Patient Name: Date of Service: [name] [name] 09/05/2022 Medical Record Number: [id number] Patient Account Number: [id number] Date of Birth/Sex: Treating RN: 1965/12/28 (57 y.o. [name], [name] Primary Care Provider: [name] Other Clinician: Karl Bales Referring Provider: Treating Provider/Extender: [name], [name]: 7 Diagnosis Coding ICD-10 Codes Code Description [phone] Non-pressure chronic ulcer of other part of right foot with necrosis of bone M86.671 Other chronic osteomyelitis, right ankle and foot E10.621 Type 1 diabetes mellitus with foot ulcer Facility Procedures [name] [name], [name] [name] ([id number]): CPT4 Code Description Modifier [id number]-(Facility Use Only) HBOT full body chamber, ,  ICD-10 Diagnosis Description E10.621 Type 1 diabetes mellitus with foot ulcer M86.671 Other chronic osteomyelitis, right ankle  and foot 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

## 2022-09-05 NOTE — Progress Notes (Signed)
[name] [name] ([id number]) [id number] Page 1 of 2 Visit Report for 09/05/2022 Arrival Information Details Patient Name: Date of Service: [name] [name], [name] [name] 09/05/2022 7:30 A M Medical Record Number: [id number] Patient Account Number: [id number] Date of Birth/Sex: Treating RN: 03/03/1965 (57 y.o. [name] Primary Care Elliot Simoneaux: [name] Other Clinician: Karl Bales Referring Tyannah Sane: Treating Curran Lenderman/Extender: [name], [name]: 7 Visit Information History Since Last Visit All ordered tests and consults were completed: Yes Patient Arrived: Wheel Chair Added or deleted any medications: No Arrival Time: 07:32 Any new allergies or adverse reactions: No Accompanied By: Husband Had a fall or experienced change in No Transfer Assistance: None activities of daily living that may affect Patient Identification Verified: Yes risk of falls: Secondary Verification Process Completed: Yes Signs or symptoms of abuse/neglect since last visito No Patient Requires Transmission-Based Precautions: No Hospitalized since last visit: No Patient Has Alerts: No Implantable device outside of the clinic excluding No cellular tissue based products placed in the center since last visit: Pain Present Now: No Electronic Signature(s) Signed: 09/05/2022 1:34:37 PM By: Karl Bales EMT Entered By: Karl Bales on 09/05/2022 13:34:36 -------------------------------------------------------------------------------- Encounter Discharge Information Details Patient Name: Date of Service: [name] Milo D. 09/05/2022 7:30 A M Medical Record Number: [id number] Patient Account Number: [id number] Date of Birth/Sex: Treating RN: May 18, 1965 (57 y.o. [name] Primary Care Richrd Kuzniar: [name] Other Clinician: Karl Bales Referring Amparo Donalson: Treating Kalisi Bevill/Extender: Caleen Essex GESH Weeks in  Treatment: 7 Encounter Discharge Information Items Discharge Condition: Stable Ambulatory Status: Wheelchair Discharge Destination: Home Transportation: Private Auto Accompanied By: Husband Schedule Follow-up Appointment: Yes Clinical Summary of Care: Electronic Signature(s) Signed: 09/05/2022 1:45:55 PM By: Karl Bales EMT Entered By: Karl Bales on 09/05/2022 13:45:55 [name] ([id number]) [id number] Page 2 of 2 -------------------------------------------------------------------------------- Vitals Details Patient Name: Date of Service: [name] [name], [name] [name] 09/05/2022 7:30 A M Medical Record Number: [id number] Patient Account Number: [id number] Date of Birth/Sex: Treating RN: 01/02/1966 (57 y.o. [name] Primary Care Amani Nodarse: [name] Other Clinician: Karl Bales Referring Elcie Pelster: Treating Tiya Schrupp/Extender: [name], [name]: 7 Vital Signs Time Taken: 07:42 Capillary Blood Glucose (mg/dl): 244 Height (in): 65 Reference Range: 80 - 120 mg / dl Weight (lbs): 010 Body Mass Index (BMI): 17.5 Electronic Signature(s) Signed: 09/05/2022 1:36:05 PM By: Karl Bales EMT Entered By: Karl Bales on 09/05/2022 13:36:05

## 2022-09-06 ENCOUNTER — Encounter (HOSPITAL_BASED_OUTPATIENT_CLINIC_OR_DEPARTMENT_OTHER): Payer: BC Managed Care – PPO | Admitting: Internal Medicine

## 2022-09-06 DIAGNOSIS — E10621 Type 1 diabetes mellitus with foot ulcer: Secondary | ICD-10-CM

## 2022-09-06 DIAGNOSIS — M86671 Other chronic osteomyelitis, right ankle and foot: Secondary | ICD-10-CM

## 2022-09-06 DIAGNOSIS — L97514 Non-pressure chronic ulcer of other part of right foot with necrosis of bone: Secondary | ICD-10-CM | POA: Diagnosis not present

## 2022-09-06 LAB — GLUCOSE, CAPILLARY
Glucose-Capillary: 223 mg/dL — ABNORMAL HIGH (ref 70–99)
Glucose-Capillary: 130 mg/dL — ABNORMAL HIGH (ref 70–99)

## 2022-09-06 NOTE — Progress Notes (Signed)
ERMALINE, HONAKER ([id number]) [id number] Page 1 of 2 Visit Report for 09/06/2022 Arrival Information Details Patient Name: Date of Service: [name] [name], [name] [name] 09/06/2022 10:30 A M Medical Record Number: [id number] Patient Account Number: [id number] Date of Birth/Sex: Treating RN: 01/03/1966 (57 y.o. [name] [name], [name] [name] Primary Care Sharmila Wrobleski: [name] Other Clinician: Karl Bales Referring Thierry Dobosz: Treating Iness Pangilinan/Extender: [name], [name]: 8 Visit Information History Since Last Visit All ordered tests and consults were completed: Yes Patient Arrived: Wheel Chair Added or deleted any medications: No Arrival Time: 10:30 Any new allergies or adverse reactions: No Accompanied By: Friend Had a fall or experienced change in No Transfer Assistance: None activities of daily living that may affect Patient Identification Verified: Yes risk of falls: Secondary Verification Process Completed: Yes Signs or symptoms of abuse/neglect since last visito No Patient Requires Transmission-Based Precautions: No Hospitalized since last visit: No Patient Has Alerts: No Implantable device outside of the clinic excluding No cellular tissue based products placed in the center since last visit: Pain Present Now: No Electronic Signature(s) Signed: 09/06/2022 2:05:54 PM By: Karl Bales EMT Entered By: Karl Bales on 09/06/2022 14:05:54 -------------------------------------------------------------------------------- Encounter Discharge Information Details Patient Name: Date of Service: [name] Milo D. 09/06/2022 10:30 A M Medical Record Number: [id number] Patient Account Number: [id number] Date of Birth/Sex: Treating RN: 04-15-1965 (57 y.o. [name] [name] Primary Care Keyra Virella: [name] Other Clinician: Karl Bales Referring Yatzil Clippinger: Treating Johnny Gorter/Extender: [name], [name]:  8 Encounter Discharge Information Items Discharge Condition: Stable Ambulatory Status: Wheelchair Discharge Destination: Home Transportation: Private Auto Accompanied By: freind Schedule Follow-up Appointment: No Clinical Summary of Care: Notes Discharged to Wound Care Clinic. Electronic Signature(s) Signed: 09/06/2022 2:23:28 PM By: Karl Bales EMT Previous Signature: 09/06/2022 2:13:26 PM Version By: Karl Bales EMT Entered By: Karl Bales on 09/06/2022 14:23:28 [name] [name] ([id number]) [id number] Page 2 of 2 -------------------------------------------------------------------------------- Vitals Details Patient Name: Date of Service: [name] [name], [name] [name] 09/06/2022 10:30 A M Medical Record Number: [id number] Patient Account Number: [id number] Date of Birth/Sex: Treating RN: March 31, 1965 (57 y.o. [name] [name], [name] [name] Primary Care Airyonna Franklyn: [name] Other Clinician: Karl Bales Referring Gennett Garcia: Treating Erla Bacchi/Extender: [name], [name]: 8 Vital Signs Time Taken: 10:20 Temperature (F): 97.4 Height (in): 65 Pulse (bpm): 75 Weight (lbs): 105 Respiratory Rate (breaths/min): 18 Body Mass Index (BMI): 17.5 Blood Pressure (mmHg): 97/67 Capillary Blood Glucose (mg/dl): 244 Reference Range: 80 - 120 mg / dl Electronic Signature(s) Signed: 09/06/2022 2:06:25 PM By: Karl Bales EMT Entered By: Karl Bales on 09/06/2022 14:06:25

## 2022-09-06 NOTE — Progress Notes (Addendum)
[name] [name], [name] [name] ([id number]) [id number] Page 1 of 2 Visit Report for 09/06/2022 HBO Details Patient Name: Date of Service: [name] [name], [name] [name] 09/06/2022 10:30 A M Medical Record Number: [id number] Patient Account Number: [id number] Date of Birth/Sex: Treating RN: February 17, 1966 (57 y.o. [name], [name] Primary Care Casimer Russett: [name] Other Clinician: Karl Bales Referring Lavonne Kinderman: Treating Josephanthony Tindel/Extender: [name], [name]: 8 HBO Treatment Course Details Treatment Course Number: 2 Ordering Skyanne Welle: [name] T Treatments Ordered: otal 40 HBO Treatment Start Date: 07/18/2022 HBO Indication: Diabetic Ulcer(s) of the Lower Extremity Standard/Conservative Wound Care tried and failed greater than or equal to 30 days Wound #4 Right Calcaneus HBO Treatment Details Treatment Number: 33 Patient Type: Outpatient Chamber Type: Monoplace Chamber Serial #: [id number] Treatment Protocol: 2.0 ATA with 90 minutes oxygen, with two 5 minute air breaks Treatment Details Compression Rate Down: 1.0 psi / minute De-Compression Rate Up: 2.0 psi / minute A breaks and breathing ir Compress Tx Pressure periods Decompress Decompress Begins Reached (leave unused spaces Begins Ends blank) Chamber Pressure (ATA 1 2 2 2 2 2  --2 1 ) Clock Time (24 hr) 10:37 10:50 11:20 11:25 11:56 12:00 - - 12:30 12:39 Treatment Length: 122 (minutes) Treatment Segments: 4 Vital Signs Capillary Blood Glucose Reference Range: 80 - 120 mg / dl HBO Diabetic Blood Glucose Intervention Range: <131 mg/dl or >308 mg/dl Time Vitals Blood Respiratory Capillary Blood Glucose Pulse Action Type: Pulse: Temperature: Taken: Pressure: Rate: Glucose (mg/dl): Meter #: Oximetry (%) Taken: Pre 10:20 97/67 75 18 97.4 223 Post 12:43 147/78 72 18 97.4 130 Treatment Response Treatment Toleration: Well Treatment Completion Status: Treatment Completed without  Adverse Event Treatment Notes Dr. Mikey Bussing informed of per treatment blood pressure. Additional Procedure Documentation Tissue Sevierity: Necrosis of bone Physician HBO Attestation: I certify that I supervised this HBO treatment in accordance with Medicare guidelines. A trained emergency response team is readily available per Yes hospital policies and procedures. Continue HBOT as ordered. Yes Electronic Signature(s) Signed: 09/08/2022 11:31:25 AM By: [name] DO Previous Signature: 09/06/2022 2:11:56 PM Version By: Karl Bales EMT Previous Signature: 09/06/2022 2:56:31 PM Version By: [name] DO Entered By: [name] on 09/08/2022 09:51:24 [name] [name] ([id number]) [id number] Page 2 of 2 -------------------------------------------------------------------------------- HBO Safety Checklist Details Patient Name: Date of Service: [name] [name], [name] [name] 09/06/2022 10:30 A M Medical Record Number: [id number] Patient Account Number: [id number] Date of Birth/Sex: Treating RN: [birth year] (57 y.o. [name], [name] Primary Care Jann Ra: [name] Other Clinician: Karl Bales Referring Grier Vu: Treating Dannetta Lekas/Extender: [name], [name]: 8 HBO Safety Checklist Items Safety Checklist Consent Form Signed Patient voided / foley secured and emptied When did you last eato 0800 Last dose of injectable or oral agent 0800 Ostomy pouch emptied and vented if applicable NA All implantable devices assessed, documented and approved NA Intravenous access site secured and place NA Valuables secured Linens and cotton and cotton/polyester blend (less than 51% polyester) Personal oil-based products / skin lotions / body lotions removed Wigs or hairpieces removed Human Extensions approved Smoking or tobacco materials removed Books / newspapers / magazines / loose paper removed Cologne, aftershave, perfume and  deodorant removed Jewelry removed (may wrap wedding band) Make-up removed Hair care products removed Battery operated devices (external) removed Libre3 Heating patches and chemical warmers removed Titanium eyewear removed NA Nail polish cured greater than 10 hours Over 10 hours old Casting material cured greater than 10 hours NA  Hearing aids removed NA Loose dentures or partials removed NA Prosthetics have been removed NA Patient demonstrates correct use of air break device (if applicable) Patient concerns have been addressed Patient grounding bracelet on and cord attached to chamber Specifics for Inpatients (complete in addition to above) Medication sheet sent with patient NA Intravenous medications needed or due during therapy sent with patient NA Drainage tubes (e.g. nasogastric tube or chest tube secured and vented) NA Endotracheal or Tracheotomy tube secured NA Cuff deflated of air and inflated with saline NA Airway suctioned NA Notes The safety checklist was done before the treatment was started. Electronic Signature(s) Signed: 09/06/2022 2:09:52 PM By: Karl Bales EMT Entered By: Karl Bales on 09/06/2022 14:09:51

## 2022-09-06 NOTE — Progress Notes (Signed)
ELAIZA, TIJERINO ([id number]) [id number] Page 1 of 2 Visit Report for 09/06/2022 Problem List Details Patient Name: Date of Service: [name] [name], [name] [name] 09/06/2022 10:30 A M Medical Record Number: [id number] Patient Account Number: [id number] Date of Birth/Sex: Treating RN: 03-27-1965 (57 y.o. 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 Other chronic osteomyelitis, right ankle and foot 07/12/2022 No Yes E10.621 Type 1 diabetes mellitus with foot ulcer 07/12/2022 No Yes Inactive Problems Resolved Problems Electronic Signature(s) Signed: 09/06/2022 2:12:47 PM By: Karl Bales EMT Signed: 09/06/2022 2:56:31 PM By: [name] DO Entered By: Karl Bales on 09/06/2022 14:12:47 -------------------------------------------------------------------------------- SuperBill Details Patient Name: Date of Service: [name] [name] 09/06/2022 Medical Record Number: [id number] Patient Account Number: [id number] Date of Birth/Sex: Treating RN: 03-20-1965 (57 y.o. 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 Other chronic osteomyelitis, right ankle and foot E10.621 Type 1 diabetes mellitus with foot ulcer Facility Procedures [name] [name], [name] [name] ([id number]): CPT4 Code Description Modifier [id number] G0277-(Facility Use Only) HBOT full body chamber, ,  ICD-10 Diagnosis Description E10.621 Type 1 diabetes mellitus with foot ulcer L97.514 Non-pressure chronic ulcer of other part  of right foot with necrosis o M86.671 Other chronic osteomyelitis, right ankle and foot [id number] Page 2 of 2: Quantity 4 f bone Physician Procedures : CPT4 Code Description Modifier [id number] [phone] - WC PHYS HYPERBARIC OXYGEN THERAPY ICD-10 Diagnosis Description E10.621 Type 1 diabetes mellitus with foot ulcer 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

## 2022-09-07 ENCOUNTER — Encounter (HOSPITAL_BASED_OUTPATIENT_CLINIC_OR_DEPARTMENT_OTHER): Payer: BC Managed Care – PPO | Admitting: Physician Assistant

## 2022-09-07 DIAGNOSIS — E10621 Type 1 diabetes mellitus with foot ulcer: Secondary | ICD-10-CM | POA: Diagnosis not present

## 2022-09-07 LAB — GLUCOSE, CAPILLARY
Glucose-Capillary: 230 mg/dL — ABNORMAL HIGH (ref 70–99)
Glucose-Capillary: 190 mg/dL — ABNORMAL HIGH (ref 70–99)

## 2022-09-07 NOTE — Progress Notes (Signed)
MAKELLE, MARRONE ([id number]) [id number] Page 1 of 2 Visit Report for 09/07/2022 Arrival Information Details Patient Name: Date of Service: [name] [name], [name] [name] 09/07/2022 10:30 A M Medical Record Number: [id number] Patient Account Number: [id number] Date of Birth/Sex: Treating RN: December 13, 1965 (57 y.o. [name], Ladona Ridgel Primary Care Cyani Kallstrom: [name] Other Clinician: Karl Bales Referring Jizelle Conkey: Treating Hanadi Stanly/Extender: Alford Highland, [name]: 8 Visit Information History Since Last Visit All ordered tests and consults were completed: Yes Patient Arrived: Wheel Chair Added or deleted any medications: No Arrival Time: 10:04 Any new allergies or adverse reactions: No Accompanied By: Friend Had a fall or experienced change in No Transfer Assistance: None activities of daily living that may affect Patient Identification Verified: Yes risk of falls: Secondary Verification Process Completed: Yes Signs or symptoms of abuse/neglect since last visito No Patient Requires Transmission-Based Precautions: No Hospitalized since last visit: No Patient Has Alerts: No Implantable device outside of the clinic excluding No cellular tissue based products placed in the center since last visit: Pain Present Now: No Electronic Signature(s) Signed: 09/07/2022 2:40:58 PM By: Karl Bales EMT Entered By: Karl Bales on 09/07/2022 14:40:57 -------------------------------------------------------------------------------- Encounter Discharge Information Details Patient Name: Date of Service: [name] Milo D. 09/07/2022 10:30 A M Medical Record Number: [id number] Patient Account Number: [id number] Date of Birth/Sex: Treating RN: Jun 18, 1965 (57 y.o. [name] Primary Care Phil Corti: [name] Other Clinician: Karl Bales Referring Aarib Pulido: Treating Lateka Rady/Extender: Alford Highland, YO GESH Weeks in  Treatment: 8 Encounter Discharge Information Items Discharge Condition: Stable Ambulatory Status: Wheelchair Discharge Destination: Home Transportation: Private Auto Accompanied By: Clearence Cheek Schedule Follow-up Appointment: Yes Clinical Summary of Care: Electronic Signature(s) Signed: 09/07/2022 2:48:32 PM By: Karl Bales EMT Entered By: Karl Bales on 09/07/2022 14:48:32 [name] [name] ([id number]) [id number] Page 2 of 2 -------------------------------------------------------------------------------- Vitals Details Patient Name: Date of Service: [name] [name], [name] [name] 09/07/2022 10:30 A M Medical Record Number: [id number] Patient Account Number: [id number] Date of Birth/Sex: Treating RN: 1965-03-01 (57 y.o. [name] Primary Care Ned Kakar: [name] Other Clinician: Karl Bales Referring Margean Korell: Treating Kikuye Korenek/Extender: Alford Highland, [name]: 8 Vital Signs Time Taken: 10:11 Temperature (F): 98.1 Height (in): 65 Pulse (bpm): 79 Weight (lbs): 105 Respiratory Rate (breaths/min): 18 Body Mass Index (BMI): 17.5 Blood Pressure (mmHg): 108/71 Capillary Blood Glucose (mg/dl): 416 Reference Range: 80 - 120 mg / dl Electronic Signature(s) Signed: 09/07/2022 2:41:28 PM By: Karl Bales EMT Entered By: Karl Bales on 09/07/2022 14:41:28

## 2022-09-07 NOTE — Progress Notes (Addendum)
BRINSLEY, WENCE ([id number]) [id number] Page 1 of 2 Visit Report for 09/07/2022 HBO Details Patient Name: Date of Service: [name] [name], [name] [name] 09/07/2022 10:30 A M Medical Record Number: [id number] Patient Account Number: [id number] Date of Birth/Sex: Treating RN: 07/24/1965 (57 y.o. [name] [name] Primary Care [name] [name]: [name] Other Clinician: Karl Bales Referring Aljean Horiuchi: Treating [name] [name]/Extender: [name] [name], YO [name] [name] in Treatment: 8 HBO Treatment Course Details Treatment Course Number: 2 Ordering Ivy Meriwether: [name] T Treatments Ordered: otal 40 HBO Treatment Start Date: 07/18/2022 HBO Indication: Diabetic Ulcer(s) of the Lower Extremity Standard/Conservative Wound Care tried and failed greater than or equal to 30 days Wound #4 Right Calcaneus HBO Treatment Details Treatment Number: 34 Patient Type: Outpatient Chamber Type: Monoplace Chamber Serial #: [id number] Treatment Protocol: 2.0 ATA with 90 minutes oxygen, with two 5 minute air breaks Treatment Details Compression Rate Down: 1.5 psi / minute De-Compression Rate Up: 2.0 psi / minute A breaks and breathing ir Compress Tx Pressure periods Decompress Decompress Begins Reached (leave unused spaces Begins Ends blank) Chamber Pressure (ATA 1 2 2 2 2 2  --2 1 ) Clock Time (24 hr) 10:38 10:48 11:18 11:23 11:53 11:58 - - 12:28 12:36 Treatment Length: 118 (minutes) Treatment Segments: 4 Vital Signs Capillary Blood Glucose Reference Range: 80 - 120 mg / dl HBO Diabetic Blood Glucose Intervention Range: <131 mg/dl or >578 mg/dl Time Vitals Blood Respiratory Capillary Blood Glucose Pulse Action Type: Pulse: Temperature: Taken: Pressure: Rate: Glucose (mg/dl): Meter #: Oximetry (%) Taken: Pre 10:11 108/71 79 18 98.1 230 Post 12:43 151/90 79 18 97.4 190 Treatment Response Treatment Toleration: Well Treatment Completion Status: Treatment Completed without  Adverse Event Additional Procedure Documentation Tissue Sevierity: Necrosis of bone Electronic Signature(s) Signed: 09/07/2022 2:47:20 PM By: Karl Bales EMT Signed: 09/07/2022 5:48:19 PM By: Allen Derry PA-C Entered By: Karl Bales on 09/07/2022 14:47:20 HBO Safety Checklist Details -------------------------------------------------------------------------------- [name] ([id number]) [id number] Page 2 of 2 Patient Name: Date of Service: [name] [name], [name] [name] 09/07/2022 10:30 A M Medical Record Number: [id number] Patient Account Number: [id number] Date of Birth/Sex: Treating RN: 1965/04/30 (57 y.o. [name] [name] Primary Care Callaway Hailes: [name] Other Clinician: Karl Bales Referring Hasset Chaviano: Treating Hamdi Vari/Extender: [name] [name], YO [name] [name] in Treatment: 8 HBO Safety Checklist Items Safety Checklist Consent Form Signed Patient voided / foley secured and emptied When did you last eato 0800 Last dose of injectable or oral agent 0815 Ostomy pouch emptied and vented if applicable NA All implantable devices assessed, documented and approved NA Intravenous access site secured and place NA Valuables secured Linens and cotton and cotton/polyester blend (less than 51% polyester) Personal oil-based products / skin lotions / body lotions removed Wigs or hairpieces removed Human Extensions approved Smoking or tobacco materials removed Books / newspapers / magazines / loose paper removed Cologne, aftershave, perfume and deodorant removed Jewelry removed (may wrap wedding band) Make-up removed Hair care products removed Battery operated devices (external) removed Libre3 Heating patches and chemical warmers removed Titanium eyewear removed NA Nail polish cured greater than 10 hours Over 10 hours old Haematologist cured greater than 10 hours NA Hearing aids removed NA Loose dentures or partials  removed NA Prosthetics have been removed NA Patient demonstrates correct use of air break device (if applicable) Patient concerns have been addressed Patient grounding bracelet on and cord attached to chamber Specifics for Inpatients (complete in addition to above) Medication sheet sent with patient NA Intravenous medications  needed or due during therapy sent with patient NA Drainage tubes (e.g. nasogastric tube or chest tube secured and vented) NA Endotracheal or Tracheotomy tube secured NA Cuff deflated of air and inflated with saline NA Airway suctioned NA Notes The safety checklist was done before the treatment was started. Electronic Signature(s) Signed: 09/07/2022 2:42:43 PM By: Karl Bales EMT Entered By: Karl Bales on 09/07/2022 14:42:43

## 2022-09-07 NOTE — Progress Notes (Signed)
TYYNE, CLIETT ([id number]) [id number] Page 1 of 8 Visit Report for 09/06/2022 Arrival Information Details Patient Name: Date of Service: [name] [name], [name] [name] 09/06/2022 12:30 PM Medical Record Number: [id number] Patient Account Number: [id number] Date of Birth/Sex: Treating RN: December 04, 1965 (57 y.o. [name] [name], [name] Primary Care Akeelah Seppala: [name] Other Clinician: Referring Katelynn Heidler: Treating Jaivon Vanbeek/Extender: [name], [name]: 8 Visit Information History Since Last Visit Added or deleted any medications: No Patient Arrived: Wheel Chair Any new allergies or adverse reactions: No Arrival Time: 12:50 Had a fall or experienced change in No Accompanied By: self activities of daily living that may affect Transfer Assistance: None risk of falls: Patient Identification Verified: Yes Signs or symptoms of abuse/neglect since last visito No Secondary Verification Process Completed: Yes Hospitalized since last visit: No Patient Requires Transmission-Based Precautions: No Implantable device outside of the clinic excluding No Patient Has Alerts: No cellular tissue based products placed in the center since last visit: Has Dressing in Place as Prescribed: Yes Pain Present Now: No Electronic Signature(s) Signed: 09/06/2022 5:26:31 PM By: Shawn Stall RN, BSN Entered By: Shawn Stall on 09/06/2022 12:50:23 -------------------------------------------------------------------------------- Encounter Discharge Information Details Patient Name: Date of Service: [name] Milo D. 09/06/2022 12:30 PM Medical Record Number: [id number] Patient Account Number: [id number] Date of Birth/Sex: Treating RN: 1965/04/16 (57 y.o. [name] [name] Primary Care Reyne Falconi: [name] Other Clinician: Referring Kamill Fulbright: Treating Kharee Lesesne/Extender: [name], [name]: 8 Encounter Discharge Information Items Post  Procedure Vitals Discharge Condition: Stable Temperature (F): 97.4 Ambulatory Status: Wheelchair Pulse (bpm): 72 Discharge Destination: Home Respiratory Rate (breaths/min): 20 Transportation: Private Auto Blood Pressure (mmHg): 147/78 Accompanied By: self Schedule Follow-up Appointment: Yes Clinical Summary of Care: Electronic Signature(s) Signed: 09/06/2022 5:26:31 PM By: Shawn Stall RN, BSN Entered By: Shawn Stall on 09/06/2022 13:19:29 [name] [name] ([id number]) [id number] Page 2 of 8 -------------------------------------------------------------------------------- Lower Extremity Assessment Details Patient Name: Date of Service: [name] [name] 09/06/2022 12:30 PM Medical Record Number: [id number] Patient Account Number: [id number] Date of Birth/Sex: Treating RN: [birth year] (57 y.o. [name] [name] Primary Care Latori Beggs: [name] Other Clinician: Referring Kule Gascoigne: Treating Chayse Zatarain/Extender: [name], [name]: 8 Edema Assessment Assessed: [Left: No] [Right: Yes] Edema: [Left: N] [Right: o] Calf Left: Right: Point of Measurement: From Medial Instep 26 cm Ankle Left: Right: Point of Measurement: From Medial Instep 20.5 cm Vascular Assessment Pulses: Dorsalis Pedis Palpable: [Right:Yes] Electronic Signature(s) Signed: 09/06/2022 5:26:31 PM By: Shawn Stall RN, BSN Entered By: Shawn Stall on 09/06/2022 12:51:08 -------------------------------------------------------------------------------- Multi Wound Chart Details Patient Name: Date of Service: [name] Milo D. 09/06/2022 12:30 PM Medical Record Number: [id number] Patient Account Number: [id number] Date of Birth/Sex: Treating RN: Oct 25, 1965 (57 y.o. F) Primary Care Darrielle Pflieger: [name] Other Clinician: Referring Niall Illes: Treating Shalom Mcguiness/Extender: [name], [name]: 8 Vital Signs Height(in):  65 Capillary Blood Glucose(mg/dl): 536 Weight(lbs): 644 Pulse(bpm): 72 Body Mass Index(BMI): 17.5 Blood Pressure(mmHg): 147/78 Temperature(F): 97.4 Respiratory Rate(breaths/min): 18 [4:Photos:] [N/A:N/A] Right Calcaneus N/A N/A Wound Location: Gradually Appeared N/A N/A Wounding Event: Diabetic Wound/Ulcer of the Lower N/A N/A Primary Etiology: Extremity Hypertension, Type I Diabetes, N/A N/A Comorbid History: Osteoarthritis, Osteomyelitis, Neuropathy 05/30/2022 N/A N/A Date Acquired: 8 N/A N/A Weeks of Treatment: Open N/A N/A Wound Status: No N/A N/A Wound Recurrence: 1.2x1x0.2 N/A N/A Measurements L x W x D (cm) 0.942 N/A N/A A (cm) : rea  0.188 N/A N/A Volume (cm) : 89.20% N/A N/A % Reduction in A rea: 92.80% N/A N/A % Reduction in Volume: Grade 2 N/A N/A Classification: Medium N/A N/A Exudate A mount: Serosanguineous N/A N/A Exudate Type: red, brown N/A N/A Exudate Color: Distinct, outline attached N/A N/A Wound Margin: Large (67-100%) N/A N/A Granulation A mount: Red N/A N/A Granulation Quality: None Present (0%) N/A N/A Necrotic A mount: Fat Layer (Subcutaneous Tissue): Yes N/A N/A Exposed Structures: Fascia: No Tendon: No Muscle: No Joint: No Bone: No Medium (34-66%) N/A N/A Epithelialization: Debridement - Selective/Open Wound N/A N/A Debridement: Pre-procedure Verification/Time Out 13:10 N/A N/A Taken: Lidocaine 4% Topical Solution N/A N/A Pain Control: Skin/Epidermis N/A N/A Level: 0.94 N/A N/A Debridement A (sq cm): rea Curette N/A N/A Instrument: Moderate N/A N/A Bleeding: Pressure N/A N/A Hemostasis A chieved: 0 N/A N/A Procedural Pain: 0 N/A N/A Post Procedural Pain: Procedure was tolerated well N/A N/A Debridement Treatment Response: 1.2x1x0.2 N/A N/A Post Debridement Measurements L x W x D (cm) 0.188 N/A N/A Post Debridement Volume: (cm) No Abnormalities Noted N/A N/A Periwound Skin Texture: Maceration: Yes N/A  N/A Periwound Skin Moisture: No Abnormalities Noted N/A N/A Periwound Skin Color: Hot N/A N/A Temperature: Debridement N/A N/A Procedures Performed: Treatment Notes Wound #4 (Calcaneus) Wound Laterality: Right Cleanser Dakins Discharge Instruction: cleanse with Dakin's Peri-Wound Care Topical Primary Dressing Hydrofera Blue Classic Foam, 4x4 in Discharge Instruction: Moisten with saline prior to applying to wound bed Secondary Dressing Woven Gauze Sponge, Non-Sterile 4x4 in Discharge Instruction: Apply over primary dressing as directed. Zetuvit Plus Silicone Border Dressing 5x5 (in/in) Discharge Instruction: Apply silicone border over primary dressing as directed. Secured With Compression Wrap Compression Stockings Add-Ons [name] [name], [name] [name] ([id number]) [id number] Page 4 of 8 Electronic Signature(s) Signed: 09/06/2022 2:56:31 PM By: [name] DO Entered By: [name] on 09/06/2022 13:53:56 -------------------------------------------------------------------------------- Multi-Disciplinary Care Plan Details Patient Name: Date of Service: [name] [name], [name] [name] 09/06/2022 12:30 PM Medical Record Number: [id number] Patient Account Number: [id number] Date of Birth/Sex: Treating RN: April 18, 1965 (57 y.o. [name] [name], [name] Primary Care Jarmar Rousseau: [name] Other Clinician: Referring Dawnn Nam: Treating Keeara Frees/Extender: [name], [name]: 8 Active Inactive HBO Nursing Diagnoses: Anxiety related to feelings of confinement associated with the hyperbaric oxygen chamber Anxiety related to knowledge deficit of hyperbaric oxygen therapy and treatment procedures Potential for barotraumas to ears, sinuses, teeth, and lungs or cerebral gas embolism related to changes in atmospheric pressure inside hyperbaric oxygen chamber Potential for oxygen toxicity seizures related to delivery of 100% oxygen at an increased  atmospheric pressure Potential for pulmonary oxygen toxicity related to delivery of 100% oxygen at an increased atmospheric pressure Goals: Patient and/or family will be able to state/discuss factors appropriate to the management of their disease process during treatment Date Initiated: 07/12/2022 Target Resolution Date: 09/30/2022 Goal Status: Active Patient will tolerate the hyperbaric oxygen therapy treatment Date Initiated: 07/12/2022 Target Resolution Date: 09/23/2022 Goal Status: Active Patient/caregiver will verbalize understanding of HBO goals, rationale, procedures and potential hazards Date Initiated: 07/12/2022 Date Inactivated: 08/12/2022 Target Resolution Date: 08/09/2022 Goal Status: Met Interventions: Administer a five (5) minute air break for patient if signs and symptoms of seizure appear and notify the hyperbaric physician Administer a ten (10) minute air break for patient if signs and symptoms of seizure appear and notify the hyperbaric physician Administer decongestants, per physician orders, prior to HBO2 Administer the correct therapeutic gas delivery based on the patients needs and limitations, per physician order  Assess and provide for patients comfort related to the hyperbaric environment and equalization of middle ear Assess for signs and symptoms related to adverse events, including but not limited to confinement anxiety, pneumothorax, oxygen toxicity and baurotrauma Assess patient for any history of confinement anxiety Assess patient's knowledge and expectations regarding hyperbaric medicine and provide education related to the hyperbaric environment, goals of treatment and prevention of adverse events Implement protocols to decrease risk of pneumothorax in high risk patients Notes: Nutrition Nursing Diagnoses: Imbalanced nutrition Impaired glucose control: actual or potential Goals: Patient/caregiver verbalizes understanding of need to maintain therapeutic  glucose control per primary care physician Date Initiated: 07/12/2022 Target Resolution Date: 08/22/2022 Goal Status: Active Patient/caregiver will maintain therapeutic glucose control Date Initiated: 07/12/2022 Target Resolution Date: 09/23/2022 Goal Status: Active JOWANA, THUMMA ([id number]) [phone].pdf Page 5 of 8 Interventions: Assess HgA1c results as ordered upon admission and as needed Assess patient nutrition upon admission and as needed per policy Provide education on elevated blood sugars and impact on wound healing Provide education on nutrition Treatment Activities: Education provided on Nutrition : 07/12/2022 Notes: Osteomyelitis Nursing Diagnoses: Infection: osteomyelitis Knowledge deficit related to disease process and management Goals: Patient/caregiver will verbalize understanding of disease process and disease management Date Initiated: 07/12/2022 Date Inactivated: 08/12/2022 Target Resolution Date: 08/18/2022 Goal Status: Met Patient's osteomyelitis will resolve Date Initiated: 07/12/2022 Target Resolution Date: 10/28/2022 Goal Status: Active Signs and symptoms for osteomyelitis will be recognized and promptly addressed Date Initiated: 07/12/2022 Target Resolution Date: 09/30/2022 Goal Status: Active Interventions: Assess for signs and symptoms of osteomyelitis resolution every visit Provide education on osteomyelitis Notes: Electronic Signature(s) Signed: 09/06/2022 5:26:31 PM By: Shawn Stall RN, BSN Entered By: Shawn Stall on 09/06/2022 12:36:27 -------------------------------------------------------------------------------- Pain Assessment Details Patient Name: Date of Service: [name] [name], [name] [name] 09/06/2022 12:30 PM Medical Record Number: [id number] Patient Account Number: [id number] Date of Birth/Sex: Treating RN: 1965-06-03 (57 y.o. [name] [name] Primary Care Hassaan Crite: [name] Other Clinician: Referring  Emigdio Wildeman: Treating Tsering Leaman/Extender: [name], [name]: 8 Active Problems Location of Pain Severity and Description of Pain Patient Has Paino No Site Locations [place], [place] ([id number]) [phone].pdf Page 6 of 8 Pain Management and Medication Current Pain Management: Electronic Signature(s) Signed: 09/06/2022 5:26:31 PM By: Shawn Stall RN, BSN Entered By: Shawn Stall on 09/06/2022 12:50:42 -------------------------------------------------------------------------------- Patient/Caregiver Education Details Patient Name: Date of Service: [name] [name] 7/9/2024andnbsp12:30 PM Medical Record Number: [id number] Patient Account Number: [id number] Date of Birth/Gender: Treating RN: 1965/09/23 (57 y.o. [name] [name] Primary Care Physician: [name] Other Clinician: Referring Physician: Treating Physician/Extender: Caleen Essex GESH Weeks in Treatment: 8 Education Assessment Education Provided To: Patient Education Topics Provided Wound/Skin Impairment: Handouts: Caring for Your Ulcer Methods: Explain/Verbal Responses: Reinforcements needed Electronic Signature(s) Signed: 09/06/2022 5:26:31 PM By: Shawn Stall RN, BSN Entered By: Shawn Stall on 09/06/2022 12:37:43 -------------------------------------------------------------------------------- Wound Assessment Details Patient Name: Date of Service: [name] [name], [name] [name] 09/06/2022 12:30 PM Medical Record Number: [id number] Patient Account Number: [id number] Date of Birth/Sex: Treating RN: 08-09-1965 (57 y.o. [name] [name] Primary Care Ludivina Guymon: [name] Other Clinician: QUINCEE, [name] [name] ([id number]) [id number] Page 7 of 8 Referring [name]-Lee Galdamez: Treating Nayson Traweek/Extender: [name], [name]: 8 Wound Status Wound Number: 4 Primary Diabetic Wound/Ulcer of the Lower  Extremity Etiology: Wound Location: Right Calcaneus Wound Status: Open Wounding Event: Gradually Appeared Comorbid Hypertension, Type I Diabetes, Osteoarthritis, Osteomyelitis, Date Acquired: 05/30/2022 History: Neuropathy  Weeks Of Treatment: 8 Clustered Wound: No Photos Wound Measurements Length: (cm) 1.2 Width: (cm) 1 Depth: (cm) 0.2 Area: (cm) 0.942 Volume: (cm) 0.188 % Reduction in Area: 89.2% % Reduction in Volume: 92.8% Epithelialization: Medium (34-66%) Tunneling: No Undermining: No Wound Description Classification: Grade 2 Wound Margin: Distinct, outline attached Exudate Amount: Medium Exudate Type: Serosanguineous Exudate Color: red, brown Foul Odor After Cleansing: No Slough/Fibrino No Wound Bed Granulation Amount: Large (67-100%) Exposed Structure Granulation Quality: Red Fascia Exposed: No Necrotic Amount: None Present (0%) Fat Layer (Subcutaneous Tissue) Exposed: Yes Tendon Exposed: No Muscle Exposed: No Joint Exposed: No Bone Exposed: No Periwound Skin Texture Texture Color No Abnormalities Noted: Yes No Abnormalities Noted: Yes Moisture Temperature / Pain No Abnormalities Noted: No Temperature: Hot Maceration: Yes Treatment Notes Wound #4 (Calcaneus) Wound Laterality: Right Cleanser Dakins Discharge Instruction: cleanse with Dakin's Peri-Wound Care Topical Primary Dressing Hydrofera Blue Classic Foam, 4x4 in Discharge Instruction: Moisten with saline prior to applying to wound bed Secondary Dressing Woven Gauze Sponge, Non-Sterile 4x4 in Discharge Instruction: Apply over primary dressing as directed. [name] [name], [name] [name] ([id number]) [id number] Page 8 of 8 Zetuvit Plus Silicone Border Dressing 5x5 (in/in) Discharge Instruction: Apply silicone border over primary dressing as directed. Secured With Compression Wrap Compression Stockings Facilities manager) Signed: 09/06/2022 5:26:31 PM By: Shawn Stall  RN, BSN Entered By: Shawn Stall on 09/06/2022 13:15:05 -------------------------------------------------------------------------------- Vitals Details Patient Name: Date of Service: [name] Milo D. 09/06/2022 12:30 PM Medical Record Number: [id number] Patient Account Number: [id number] Date of Birth/Sex: Treating RN: 17-Oct-1965 (57 y.o. [name] [name], [name] Primary Care Quaid Yeakle: [name] Other Clinician: Referring Kaylina Cahue: Treating Ketsia Linebaugh/Extender: [name], [name]: 8 Vital Signs Time Taken: 12:43 Temperature (F): 97.4 Height (in): 65 Pulse (bpm): 72 Weight (lbs): 105 Respiratory Rate (breaths/min): 18 Body Mass Index (BMI): 17.5 Blood Pressure (mmHg): 147/78 Capillary Blood Glucose (mg/dl): 782 Reference Range: 80 - 120 mg / dl Electronic Signature(s) Signed: 09/06/2022 5:26:31 PM By: Shawn Stall RN, BSN Entered By: Shawn Stall on 09/06/2022 12:50:38

## 2022-09-08 ENCOUNTER — Encounter (HOSPITAL_BASED_OUTPATIENT_CLINIC_OR_DEPARTMENT_OTHER): Payer: BC Managed Care – PPO | Admitting: Internal Medicine

## 2022-09-08 DIAGNOSIS — E10621 Type 1 diabetes mellitus with foot ulcer: Secondary | ICD-10-CM | POA: Diagnosis not present

## 2022-09-08 DIAGNOSIS — M86671 Other chronic osteomyelitis, right ankle and foot: Secondary | ICD-10-CM | POA: Diagnosis not present

## 2022-09-08 DIAGNOSIS — L97514 Non-pressure chronic ulcer of other part of right foot with necrosis of bone: Secondary | ICD-10-CM

## 2022-09-08 LAB — GLUCOSE, CAPILLARY
Glucose-Capillary: 244 mg/dL — ABNORMAL HIGH (ref 70–99)
Glucose-Capillary: 128 mg/dL — ABNORMAL HIGH (ref 70–99)

## 2022-09-08 NOTE — Progress Notes (Signed)
TANEIA, MEALOR ([id number]) [id number] Page 1 of 2 Visit Report for 09/07/2022 Problem List Details Patient Name: Date of Service: [name] [name], [name] [name] 09/07/2022 10:30 A M Medical Record Number: [id number] Patient Account Number: [id number] Date of Birth/Sex: Treating RN: January 28, 1966 (57 y.o. 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 M86.671 Other chronic osteomyelitis, right ankle and foot 07/12/2022 No Yes E10.621 Type 1 diabetes mellitus with foot ulcer 07/12/2022 No Yes Inactive Problems Resolved Problems Electronic Signature(s) Signed: 09/07/2022 2:47:53 PM By: Karl Bales EMT Signed: 09/07/2022 5:48:19 PM By: Allen Derry PA-C Entered By: Karl Bales on 09/07/2022 14:47:53 -------------------------------------------------------------------------------- SuperBill Details Patient Name: Date of Service: [name] [name] 09/07/2022 Medical Record Number: [id number] Patient Account Number: [id number] Date of Birth/Sex: Treating RN: 11-Apr-1965 (57 y.o. [name] [name] Primary Care Provider: [name] Other Clinician: Karl Bales Referring Provider: Treating Provider/Extender: Alford Highland, [name]: 8 Diagnosis Coding ICD-10 Codes Code Description [phone] Non-pressure chronic ulcer of other part of right foot with necrosis of bone M86.671 Other chronic osteomyelitis, right ankle and foot E10.621 Type 1 diabetes mellitus with foot ulcer Facility Procedures [name], [name] ([id number]): CPT4 Code Description Modifier [id number]-(Facility Use Only) HBOT full body chamber,  , ICD-10 Diagnosis Description E10.621 Type 1 diabetes mellitus with foot ulcer L97.514 Non-pressure chronic ulcer of other part  of right foot with necrosis o M86.671 Other chronic osteomyelitis, right ankle and foot [id number] Page 2 of 2: Quantity 4 f bone Physician Procedures : CPT4 Code Description Modifier [phone] - WC PHYS HYPERBARIC OXYGEN THERAPY ICD-10 Diagnosis Description E10.621 Type 1 diabetes mellitus with foot ulcer 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

## 2022-09-08 NOTE — Progress Notes (Signed)
[name] [name], [name] [name] ([id number]) [id number] Page 1 of 2 Visit Report for 09/08/2022 Arrival Information Details Patient Name: Date of Service: [name] [name], [name] [name] 09/08/2022 10:00 A M Medical Record Number: [id number] Patient Account Number: [id number] Date of Birth/Sex: Treating RN: 08-Jan-1966 (57 y.o. [name] [name] Primary Care Joram Venson: [name] Other Clinician: Karl Bales Referring Neela Zecca: Treating Tyyne Cliett/Extender: [name], [name]: 8 Visit Information History Since Last Visit All ordered tests and consults were completed: Yes Patient Arrived: Wheel Chair Added or deleted any medications: No Arrival Time: 09:35 Any new allergies or adverse reactions: No Accompanied By: Friend Had a fall or experienced change in No Transfer Assistance: Manual activities of daily living that may affect Patient Identification Verified: Yes risk of falls: Secondary Verification Process Completed: Yes Signs or symptoms of abuse/neglect since last visito No Patient Requires Transmission-Based Precautions: No Hospitalized since last visit: No Patient Has Alerts: No Implantable device outside of the clinic excluding No cellular tissue based products placed in the center since last visit: Pain Present Now: No Electronic Signature(s) Signed: 09/08/2022 2:09:33 PM By: Karl Bales EMT Entered By: Karl Bales on 09/08/2022 14:09:33 -------------------------------------------------------------------------------- Encounter Discharge Information Details Patient Name: Date of Service: [name] Milo D. 09/08/2022 10:00 A M Medical Record Number: [id number] Patient Account Number: [id number] Date of Birth/Sex: Treating RN: 20-Sep-1965 (57 y.o. [name] [name] Primary Care Genelle Economou: [name] Other Clinician: Karl Bales Referring Lynnsie Linders: Treating Freemon Binford/Extender: Caleen Essex  GESH Weeks in Treatment: 8 Encounter Discharge Information Items Discharge Condition: Stable Ambulatory Status: Wheelchair Discharge Destination: Home Transportation: Private Auto Accompanied By: Clearence Cheek Schedule Follow-up Appointment: Yes Clinical Summary of Care: Electronic Signature(s) Signed: 09/08/2022 2:14:24 PM By: Karl Bales EMT Entered By: Karl Bales on 09/08/2022 14:14:24 [name] [name] ([id number]) [id number] Page 2 of 2 -------------------------------------------------------------------------------- Vitals Details Patient Name: Date of Service: [name] [name], [name] [name] 09/08/2022 10:00 A M Medical Record Number: [id number] Patient Account Number: [id number] Date of Birth/Sex: Treating RN: 09/23/1965 (57 y.o. [name] [name] Primary Care Jayd Forrey: [name] Other Clinician: Karl Bales Referring Tomy Khim: Treating Cesar Rogerson/Extender: [name], [name]: 8 Vital Signs Time Taken: 09:44 Temperature (F): 97.2 Height (in): 65 Pulse (bpm): 73 Weight (lbs): 105 Respiratory Rate (breaths/min): 18 Body Mass Index (BMI): 17.5 Blood Pressure (mmHg): 113/82 Capillary Blood Glucose (mg/dl): 433 Reference Range: 80 - 120 mg / dl Electronic Signature(s) Signed: 09/08/2022 2:10:04 PM By: Karl Bales EMT Entered By: Karl Bales on 09/08/2022 14:10:04

## 2022-09-08 NOTE — Progress Notes (Addendum)
KAIYAH, EBER ([id number]) [id number] Page 1 of 2 Visit Report for 09/08/2022 HBO Details Patient Name: Date of Service: [name] [name], [name] [name] 09/08/2022 10:00 A M Medical Record Number: [id number] Patient Account Number: [id number] Date of Birth/Sex: Treating RN: 10/31/1965 (57 y.o. [name] Primary Care [name]: [name] Other Clinician: Karl Bales Referring [name]: Treating [name]/Extender: [name], [name]: 8 HBO Treatment Course Details Treatment Course Number: 2 Ordering Teigan Manner: [name] T Treatments Ordered: otal 40 HBO Treatment Start Date: 07/18/2022 HBO Indication: Diabetic Ulcer(s) of the Lower Extremity Standard/Conservative Wound Care tried and failed greater than or equal to 30 days Wound #4 Right Calcaneus HBO Treatment Details Treatment Number: 35 Patient Type: Outpatient Chamber Type: Monoplace Chamber Serial #: [id number] Treatment Protocol: 2.0 ATA with 90 minutes oxygen, with two 5 minute air breaks Treatment Details Compression Rate Down: 1.5 psi / minute De-Compression Rate Up: 2.0 psi / minute A breaks and breathing ir Compress Tx Pressure periods Decompress Decompress Begins Reached (leave unused spaces Begins Ends blank) Chamber Pressure (ATA 1 2 2 2 2 2  --2 1 ) Clock Time (24 hr) 10:27 10:38 11:08 11:13 11:43 11:48 - - 12:18 12:24 Treatment Length: 117 (minutes) Treatment Segments: 4 Vital Signs Capillary Blood Glucose Reference Range: 80 - 120 mg / dl HBO Diabetic Blood Glucose Intervention Range: <131 mg/dl or >010 mg/dl Time Vitals Blood Respiratory Capillary Blood Glucose Pulse Action Type: Pulse: Temperature: Taken: Pressure: Rate: Glucose (mg/dl): Meter #: Oximetry (%) Taken: Pre 09:44 113/82 73 18 97.2 244 Post 12:30 156/99 74 18 98.2 128 Treatment Response Treatment Toleration: Well Treatment Completion Status: Treatment Completed  without Adverse Event Additional Procedure Documentation Tissue Sevierity: Necrosis of bone Physician HBO Attestation: I certify that I supervised this HBO treatment in accordance with Medicare guidelines. A trained emergency response team is readily available per Yes hospital policies and procedures. Continue HBOT as ordered. Yes Electronic Signature(s) Signed: 09/09/2022 9:53:20 AM By: [name] DO Previous Signature: 09/08/2022 2:12:47 PM Version By: Karl Bales EMT Entered By: [name] on 09/08/2022 15:48:45 [name] ([id number]) [id number] Page 2 of 2 -------------------------------------------------------------------------------- HBO Safety Checklist Details Patient Name: Date of Service: [name] [name], [name] [name] 09/08/2022 10:00 A M Medical Record Number: [id number] Patient Account Number: [id number] Date of Birth/Sex: Treating RN: [birth year] (57 y.o. [name] Primary Care Shirely Toren: [name] Other Clinician: Karl Bales Referring Graceann Boileau: Treating Jalah Warmuth/Extender: [name], [name]: 8 HBO Safety Checklist Items Safety Checklist Consent Form Signed Patient voided / foley secured and emptied When did you last eato 0800 Last dose of injectable or oral agent 0800 Ostomy pouch emptied and vented if applicable NA All implantable devices assessed, documented and approved NA Intravenous access site secured and place NA Valuables secured Linens and cotton and cotton/polyester blend (less than 51% polyester) Personal oil-based products / skin lotions / body lotions removed Wigs or hairpieces removed Human Extensions approved Smoking or tobacco materials removed Books / newspapers / magazines / loose paper removed Cologne, aftershave, perfume and deodorant removed Jewelry removed (may wrap wedding band) Make-up removed Hair care products removed Battery operated devices  (external) removed Libre3 Heating patches and chemical warmers removed Titanium eyewear removed NA Nail polish cured greater than 10 hours Over 10 hours old Casting material cured greater than 10 hours NA Hearing aids removed NA Loose dentures or partials removed NA Prosthetics have been removed NA Patient demonstrates correct use of  air break device (if applicable) Patient concerns have been addressed Patient grounding bracelet on and cord attached to chamber Specifics for Inpatients (complete in addition to above) Medication sheet sent with patient NA Intravenous medications needed or due during therapy sent with patient NA Drainage tubes (e.g. nasogastric tube or chest tube secured and vented) NA Endotracheal or Tracheotomy tube secured NA Cuff deflated of air and inflated with saline NA Airway suctioned NA Notes The safety checklist was done before the treatment was started. Electronic Signature(s) Signed: 09/08/2022 2:11:26 PM By: Karl Bales EMT Entered By: Karl Bales on 09/08/2022 14:11:25

## 2022-09-09 ENCOUNTER — Encounter (HOSPITAL_BASED_OUTPATIENT_CLINIC_OR_DEPARTMENT_OTHER): Payer: BC Managed Care – PPO | Admitting: Internal Medicine

## 2022-09-09 DIAGNOSIS — M86671 Other chronic osteomyelitis, right ankle and foot: Secondary | ICD-10-CM

## 2022-09-09 DIAGNOSIS — E10621 Type 1 diabetes mellitus with foot ulcer: Secondary | ICD-10-CM

## 2022-09-09 DIAGNOSIS — L97514 Non-pressure chronic ulcer of other part of right foot with necrosis of bone: Secondary | ICD-10-CM | POA: Diagnosis not present

## 2022-09-09 LAB — GLUCOSE, CAPILLARY
Glucose-Capillary: 401 mg/dL — ABNORMAL HIGH (ref 70–99)
Glucose-Capillary: 203 mg/dL — ABNORMAL HIGH (ref 70–99)

## 2022-09-09 NOTE — Progress Notes (Addendum)
LUCELIA, BLAIZE ([id number]) [id number] Page 1 of 2 Visit Report for 09/09/2022 Arrival Information Details Patient Name: Date of Service: NEYDA, BIER 09/09/2022 8:00 A M Medical Record Number: [id number] Patient Account Number: [id number] Date of Birth/Sex: Treating RN: 07-Jan-1966 (57 y.o. [name], [name] Primary Care Jerrick Farve: [name] Other Clinician: Karl Bales Referring Jaycion Treml: Treating Devrin Monforte/Extender: [name], [name]: 8 Visit Information History Since Last Visit All ordered tests and consults were completed: Yes Patient Arrived: Ambulatory Added or deleted any medications: No Arrival Time: 07:42 Any new allergies or adverse reactions: No Accompanied By: Husband Had a fall or experienced change in No Transfer Assistance: None activities of daily living that may affect Patient Identification Verified: Yes risk of falls: Secondary Verification Process Completed: Yes Signs or symptoms of abuse/neglect since last visito No Patient Requires Transmission-Based Precautions: No Hospitalized since last visit: No Patient Has Alerts: No Implantable device outside of the clinic excluding No cellular tissue based products placed in the center since last visit: Pain Present Now: No Electronic Signature(s) Signed: 09/09/2022 11:00:14 AM By: Karl Bales EMT Entered By: Karl Bales on 09/09/2022 11:00:14 -------------------------------------------------------------------------------- Encounter Discharge Information Details Patient Name: Date of Service: [name] 09/09/2022 8:00 A M Medical Record Number: [id number] Patient Account Number: [id number] Date of Birth/Sex: Treating RN: 11-26-1965 (57 y.o. [name], [name] Primary Care Aksel Bencomo: [name] Other Clinician: Karl Bales Referring Doxie Augenstein: Treating Kyron Schlitt/Extender: [name], YO GESH Weeks in  Treatment: 8 Encounter Discharge Information Items Discharge Condition: Stable Ambulatory Status: Wheelchair Discharge Destination: Home Transportation: Private Auto Accompanied By: Husband Schedule Follow-up Appointment: Yes Clinical Summary of Care: Electronic Signature(s) Signed: 09/09/2022 11:12:59 AM By: Karl Bales EMT Entered By: Karl Bales on 09/09/2022 11:12:59 [name] ([id number]) [id number] Page 2 of 2 -------------------------------------------------------------------------------- Vitals Details Patient Name: Date of Service: TIKVAH, ANCIRA 09/09/2022 8:00 A M Medical Record Number: [id number] Patient Account Number: [id number] Date of Birth/Sex: Treating RN: [birth year] (57 y.o. [name], [name] Primary Care Jerrion Tabbert: [name] Other Clinician: Karl Bales Referring Hayzlee Mcsorley: Treating Carmie Lanpher/Extender: [name], [name]: 8 Vital Signs Time Taken: 07:51 Temperature (F): 98.2 Height (in): 65 Pulse (bpm): 84 Weight (lbs): 105 Respiratory Rate (breaths/min): 18 Body Mass Index (BMI): 17.5 Blood Pressure (mmHg): 130/85 Capillary Blood Glucose (mg/dl): 951 Reference Range: 80 - 120 mg / dl Electronic Signature(s) Signed: 09/09/2022 11:00:52 AM By: Karl Bales EMT Entered By: Karl Bales on 09/09/2022 11:00:52

## 2022-09-09 NOTE — Progress Notes (Signed)
CRYSTALYNN, KANDA ([id number]) [id number] Page 1 of 2 Visit Report for 09/09/2022 Problem List Details Patient Name: Date of Service: [name] [name], [name] [name] 09/09/2022 8:00 A M Medical Record Number: [id number] Patient Account Number: [id number] Date of Birth/Sex: Treating RN: 1965-08-26 (57 y.o. 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 Other chronic osteomyelitis, right ankle and foot 07/12/2022 No Yes E10.621 Type 1 diabetes mellitus with foot ulcer 07/12/2022 No Yes Inactive Problems Resolved Problems Electronic Signature(s) Signed: 09/09/2022 11:12:22 AM By: Karl Bales EMT Signed: 09/09/2022 12:42:51 PM By: [name] DO Entered By: Karl Bales on 09/09/2022 11:12:22 -------------------------------------------------------------------------------- SuperBill Details Patient Name: Date of Service: [name] [name] 09/09/2022 Medical Record Number: [id number] Patient Account Number: [id number] Date of Birth/Sex: Treating RN: 1965/12/20 (57 y.o. [name], [name] Primary Care Provider: [name] Other Clinician: Karl Bales Referring Provider: Treating Provider/Extender: [name], [name]: 8 Diagnosis Coding ICD-10 Codes Code Description [phone] Non-pressure chronic ulcer of other part of right foot with necrosis of bone M86.671 Other chronic osteomyelitis, right ankle and foot E10.621 Type 1 diabetes mellitus with foot ulcer Facility Procedures [name] [name], [name] [name] ([id number]): CPT4 Code Description Modifier [id number]-(Facility Use Only) HBOT full body chamber,  , ICD-10 Diagnosis Description E10.621 Type 1 diabetes mellitus with foot ulcer L97.514 Non-pressure chronic ulcer of other part  of right foot with necrosis o M86.671 Other chronic osteomyelitis, right ankle and foot [id number] Page 2 of 2: Quantity 4 f bone Physician Procedures : CPT4 Code Description Modifier [phone] - WC PHYS HYPERBARIC OXYGEN THERAPY ICD-10 Diagnosis Description E10.621 Type 1 diabetes mellitus with foot ulcer 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

## 2022-09-09 NOTE — Progress Notes (Signed)
[name], [name] ([id number]) [id number] Page 1 of 2 Visit Report for 09/08/2022 Problem List Details Patient Name: Date of Service: [name] [name], [name] [name] 09/08/2022 10:00 A M Medical Record Number: [id number] Patient Account Number: [id number] Date of Birth/Sex: Treating RN: 1965-10-03 (57 y.o. 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 M86.671 Other chronic osteomyelitis, right ankle and foot 07/12/2022 No Yes E10.621 Type 1 diabetes mellitus with foot ulcer 07/12/2022 No Yes Inactive Problems Resolved Problems Electronic Signature(s) Signed: 09/08/2022 2:13:15 PM By: Karl Bales EMT Signed: 09/09/2022 9:53:20 AM By: [name] DO Entered By: Karl Bales on 09/08/2022 14:13:14 -------------------------------------------------------------------------------- SuperBill Details Patient Name: Date of Service: [name] [name] 09/08/2022 Medical Record Number: [id number] Patient Account Number: [id number] Date of Birth/Sex: Treating RN: 12/17/1965 (57 y.o. 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 Other chronic osteomyelitis, right ankle and foot E10.621 Type 1 diabetes mellitus with foot ulcer Facility Procedures [name], [name] ([id number]): CPT4 Code Description Modifier [id number] G0277-(Facility Use Only) HBOT full body  chamber, , ICD-10 Diagnosis Description E10.621 Type 1 diabetes mellitus with foot ulcer L97.514 Non-pressure chronic ulcer of other part  of right foot with necrosis o M86.671 Other chronic osteomyelitis, right ankle and foot [id number] Page 2 of 2: Quantity 4 f bone Physician Procedures : CPT4 Code Description Modifier [phone] - WC PHYS HYPERBARIC OXYGEN THERAPY ICD-10 Diagnosis Description E10.621 Type 1 diabetes mellitus with foot ulcer 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

## 2022-09-09 NOTE — Progress Notes (Addendum)
[name], [name] ([id number]) [id number] Page 1 of 2 Visit Report for 09/09/2022 HBO Details Patient Name: Date of Service: [name] [name], [name] [name] 09/09/2022 8:00 A M Medical Record Number: [id number] Patient Account Number: [id number] Date of Birth/Sex: Treating RN: [birth year] (57 y.o. [name], [name] Primary Care Kinda Pottle: [name] Other Clinician: Karl Bales Referring Jarryd Gratz: Treating Bruce Mayers/Extender: [name], [name]: 8 HBO Treatment Course Details Treatment Course Number: 2 Ordering Nichalos Brenton: [name] T Treatments Ordered: otal 60 HBO Treatment Start Date: 07/18/2022 HBO Indication: Diabetic Ulcer(s) of the Lower Extremity Standard/Conservative Wound Care tried and failed greater than or equal to 30 days HBO Treatment Details Treatment Number: 36 Patient Type: Outpatient Chamber Type: Monoplace Chamber Serial #: [id number] Treatment Protocol: 2.0 ATA with 90 minutes oxygen, with two 5 minute air breaks Treatment Details Compression Rate Down: 2.0 psi / minute De-Compression Rate Up: 2.0 psi / minute A breaks and breathing ir Compress Tx Pressure periods Decompress Decompress Begins Reached (leave unused spaces Begins Ends blank) Chamber Pressure (ATA 1 2 2 2 2 2  --2 1 ) Clock Time (24 hr) 08:13 08:22 08:52 08:57 09:27 09:32 - - 10:02 10:10 Treatment Length: 117 (minutes) Treatment Segments: 4 Vital Signs Capillary Blood Glucose Reference Range: 80 - 120 mg / dl HBO Diabetic Blood Glucose Intervention Range: <131 mg/dl or >308 mg/dl Time Vitals Blood Respiratory Capillary Blood Glucose Pulse Action Type: Pulse: Temperature: Taken: Pressure: Rate: Glucose (mg/dl): Meter #: Oximetry (%) Taken: Pre 07:51 130/85 84 18 98.2 401 Post 10:13 121/85 77 78 98.3 203 Treatment Response Treatment Toleration: Well Treatment Completion Status: Treatment Completed without Adverse Event Treatment  Notes I spoke with Dr. Lady Gary refence the patient blood sugar. Dr. stated to go ahead and start treatment. Additional Procedure Documentation Tissue Sevierity: Necrosis of bone Physician HBO Attestation: I certify that I supervised this HBO treatment in accordance with Medicare guidelines. A trained emergency response team is readily available per Yes hospital policies and procedures. Continue HBOT as ordered. Yes Electronic Signature(s) Signed: 09/21/2022 11:11:02 AM By: [name] DO Previous Signature: 09/13/2022 4:51:26 PM Version By: [name] DO Previous Signature: 09/09/2022 11:11:52 AM Version By: Karl Bales EMT Previous Signature: 09/09/2022 12:42:51 PM Version By: [name] DO Entered By: [name] on 09/19/2022 09:20:53 [name] ([id number]) [id number] Page 2 of 2 -------------------------------------------------------------------------------- HBO Safety Checklist Details Patient Name: Date of Service: [name] [name], [name] [name] 09/09/2022 8:00 A M Medical Record Number: [id number] Patient Account Number: [id number] Date of Birth/Sex: Treating RN: 1965-10-13 (57 y.o. [name], [name] Primary Care [name]: [name] Other Clinician: Karl Bales Referring Kempton Milne: Treating Olimpia Tinch/Extender: [name], [name]: 8 HBO Safety Checklist Items Safety Checklist Consent Form Signed Patient voided / foley secured and emptied When did you last eato 0700 Last dose of injectable or oral agent 0645 Ostomy pouch emptied and vented if applicable NA All implantable devices assessed, documented and approved NA Intravenous access site secured and place NA Valuables secured Linens and cotton and cotton/polyester blend (less than 51% polyester) Personal oil-based products / skin lotions / body lotions removed Wigs or hairpieces removed Human Extensions approved Smoking or tobacco materials  removed Books / newspapers / magazines / loose paper removed Cologne, aftershave, perfume and deodorant removed Jewelry removed (may wrap wedding band) Make-up removed Hair care products removed Battery operated devices (external) removed Heating patches and chemical warmers removed Titanium eyewear removed NA Nail polish cured greater  than 10 hours Over 10 hours old Casting material cured greater than 10 hours NA Hearing aids removed NA Loose dentures or partials removed NA Prosthetics have been removed NA Patient demonstrates correct use of air break device (if applicable) Patient concerns have been addressed Patient grounding bracelet on and cord attached to chamber Specifics for Inpatients (complete in addition to above) Medication sheet sent with patient NA Intravenous medications needed or due during therapy sent with patient NA Drainage tubes (e.g. nasogastric tube or chest tube secured and vented) NA Endotracheal or Tracheotomy tube secured NA Cuff deflated of air and inflated with saline NA Airway suctioned NA Notes The safety checklist was done before the treatment was started. Electronic Signature(s) Signed: 09/09/2022 11:08:27 AM By: Karl Bales EMT Entered By: Karl Bales on 09/09/2022 11:08:27

## 2022-09-12 ENCOUNTER — Encounter (HOSPITAL_BASED_OUTPATIENT_CLINIC_OR_DEPARTMENT_OTHER): Payer: BC Managed Care – PPO | Admitting: Internal Medicine

## 2022-09-12 ENCOUNTER — Ambulatory Visit: Payer: Medicare Other | Admitting: Podiatry

## 2022-09-12 DIAGNOSIS — M86671 Other chronic osteomyelitis, right ankle and foot: Secondary | ICD-10-CM | POA: Diagnosis not present

## 2022-09-12 DIAGNOSIS — L97514 Non-pressure chronic ulcer of other part of right foot with necrosis of bone: Secondary | ICD-10-CM | POA: Diagnosis not present

## 2022-09-12 DIAGNOSIS — E10621 Type 1 diabetes mellitus with foot ulcer: Secondary | ICD-10-CM

## 2022-09-12 LAB — GLUCOSE, CAPILLARY
Glucose-Capillary: 215 mg/dL — ABNORMAL HIGH (ref 70–99)
Glucose-Capillary: 255 mg/dL — ABNORMAL HIGH (ref 70–99)

## 2022-09-12 NOTE — Progress Notes (Signed)
[name] [name], [name] [name] ([id number]) [id number] Page 1 of 2 Visit Report for 09/12/2022 HBO Details Patient Name: Date of Service: [name] [name], [name] [name] 09/12/2022 7:30 A M Medical Record Number: [id number] Patient Account Number: [id number] Date of Birth/Sex: Treating RN: 1965-03-16 (57 y.o. [name], [name] Primary Care Brucha Ahlquist: [name] Other Clinician: Karl Bales Referring Darvell Monteforte: Treating Malayia Spizzirri/Extender: [name], [name]: 8 HBO Treatment Course Details Treatment Course Number: 2 Ordering Merlin Golden: [name] T Treatments Ordered: otal 40 HBO Treatment Start Date: 07/18/2022 HBO Indication: Diabetic Ulcer(s) of the Lower Extremity Standard/Conservative Wound Care tried and failed greater than or equal to 30 days Wound #4 Right Calcaneus HBO Treatment Details Treatment Number: 37 Patient Type: Outpatient Chamber Type: Monoplace Chamber Serial #: [id number] Treatment Protocol: 2.0 ATA with 90 minutes oxygen, with two 5 minute air breaks Treatment Details Compression Rate Down: 2.0 psi / minute De-Compression Rate Up: 2.0 psi / minute A breaks and breathing ir Compress Tx Pressure periods Decompress Decompress Begins Reached (leave unused spaces Begins Ends blank) Chamber Pressure (ATA 1 2 2 2 2 2  --2 1 ) Clock Time (24 hr) 08:02 08:12 08:42 08:47 09:17 09:22 - - 09:52 10:00 Treatment Length: 118 (minutes) Treatment Segments: 4 Vital Signs Capillary Blood Glucose Reference Range: 80 - 120 mg / dl HBO Diabetic Blood Glucose Intervention Range: <131 mg/dl or >109 mg/dl Time Vitals Blood Respiratory Capillary Blood Glucose Pulse Action Type: Pulse: Temperature: Taken: Pressure: Rate: Glucose (mg/dl): Meter #: Oximetry (%) Taken: Pre 07:44 106/73 75 18 97.6 215 Post 10:03 129/86 72 18 97.3 255 Treatment Response Treatment Toleration: Well Treatment Completion Status: Treatment Completed without  Adverse Event Additional Procedure Documentation Tissue Sevierity: Necrosis of bone Physician HBO Attestation: I certify that I supervised this HBO treatment in accordance with Medicare guidelines. A trained emergency response team is readily available per Yes hospital policies and procedures. Continue HBOT as ordered. Yes Electronic Signature(s) Signed: 09/13/2022 4:51:26 PM By: [name] DO Previous Signature: 09/12/2022 12:27:10 PM Version By: Karl Bales EMT Entered By: [name] on 09/12/2022 16:28:15 [name] ([id number]) [id number] Page 2 of 2 -------------------------------------------------------------------------------- HBO Safety Checklist Details Patient Name: Date of Service: [name] [name], [name] [name] 09/12/2022 7:30 A M Medical Record Number: [id number] Patient Account Number: [id number] Date of Birth/Sex: Treating RN: Dec 22, 1965 (57 y.o. [name], [name] Primary Care Markia Kyer: [name] Other Clinician: Karl Bales Referring Raychelle Hudman: Treating Idus Rathke/Extender: [name], [name]: 8 HBO Safety Checklist Items Safety Checklist Consent Form Signed Patient voided / foley secured and emptied When did you last eato 0700 Last dose of injectable or oral agent 0700 Ostomy pouch emptied and vented if applicable NA All implantable devices assessed, documented and approved NA Intravenous access site secured and place NA Valuables secured Linens and cotton and cotton/polyester blend (less than 51% polyester) Personal oil-based products / skin lotions / body lotions removed Wigs or hairpieces removed Human Extensions approved Smoking or tobacco materials removed Books / newspapers / magazines / loose paper removed Cologne, aftershave, perfume and deodorant removed Jewelry removed (may wrap wedding band) Make-up removed Hair care products removed Battery operated devices (external)  removed Libre3 Heating patches and chemical warmers removed Titanium eyewear removed NA Nail polish cured greater than 10 hours Over 10 hours old Haematologist cured greater than 10 hours NA Hearing aids removed NA Loose dentures or partials removed NA Prosthetics have been removed NA Patient demonstrates correct use of  air break device (if applicable) Patient concerns have been addressed Patient grounding bracelet on and cord attached to chamber Specifics for Inpatients (complete in addition to above) Medication sheet sent with patient NA Intravenous medications needed or due during therapy sent with patient NA Drainage tubes (e.g. nasogastric tube or chest tube secured and vented) NA Endotracheal or Tracheotomy tube secured NA Cuff deflated of air and inflated with saline NA Airway suctioned NA Notes The safety checklist was done before the treatment was started. Electronic Signature(s) Signed: 09/12/2022 12:25:32 PM By: Karl Bales EMT Entered By: Karl Bales on 09/12/2022 12:25:32

## 2022-09-12 NOTE — Progress Notes (Signed)
[name] [name], [name] [name] ([id number]) [id number] Page 1 of 2 Visit Report for 09/12/2022 Arrival Information Details Patient Name: Date of Service: [name] [name] 09/12/2022 7:30 A M Medical Record Number: [id number] Patient Account Number: [id number] Date of Birth/Sex: Treating RN: 27-Aug-1965 (57 y.o. [name] Primary Care Esmeralda Malay: [name] Other Clinician: Karl Bales Referring Jancarlos Thrun: Treating Kalin Amrhein/Extender: [name], [name]: 8 Visit Information History Since Last Visit All ordered tests and consults were completed: Yes Patient Arrived: Wheel Chair Added or deleted any medications: No Arrival Time: 07:34 Any new allergies or adverse reactions: No Accompanied By: Husband Had a fall or experienced change in No Transfer Assistance: None activities of daily living that may affect Patient Identification Verified: Yes risk of falls: Secondary Verification Process Completed: Yes Signs or symptoms of abuse/neglect since last visito No Patient Requires Transmission-Based Precautions: No Hospitalized since last visit: No Patient Has Alerts: No Implantable device outside of the clinic excluding No cellular tissue based products placed in the center since last visit: Pain Present Now: No Electronic Signature(s) Signed: 09/12/2022 12:23:26 PM By: Karl Bales EMT Entered By: Karl Bales on 09/12/2022 12:23:26 -------------------------------------------------------------------------------- Encounter Discharge Information Details Patient Name: Date of Service: [name] Milo D. 09/12/2022 7:30 A M Medical Record Number: [id number] Patient Account Number: [id number] Date of Birth/Sex: Treating RN: 05-05-1965 (57 y.o. [name] Primary Care Ainslie Mazurek: [name] Other Clinician: Karl Bales Referring Jesiah Yerby: Treating Winda Summerall/Extender: Caleen Essex GESH Weeks in  Treatment: 8 Encounter Discharge Information Items Discharge Condition: Stable Ambulatory Status: Wheelchair Discharge Destination: Home Transportation: Private Auto Accompanied By: Clearence Cheek Schedule Follow-up Appointment: Yes Clinical Summary of Care: Electronic Signature(s) Signed: 09/12/2022 12:28:05 PM By: Karl Bales EMT Entered By: Karl Bales on 09/12/2022 12:28:04 [name] ([id number]) [id number] Page 2 of 2 -------------------------------------------------------------------------------- Vitals Details Patient Name: Date of Service: [name] [name], [name] [name] 09/12/2022 7:30 A M Medical Record Number: [id number] Patient Account Number: [id number] Date of Birth/Sex: Treating RN: [birth year] (57 y.o. [name] Primary Care Ethelene Closser: [name] Other Clinician: Karl Bales Referring Kage Willmann: Treating Jonia Oakey/Extender: [name], [name]: 8 Vital Signs Time Taken: 07:44 Temperature (F): 97.6 Height (in): 65 Pulse (bpm): 75 Weight (lbs): 105 Respiratory Rate (breaths/min): 18 Body Mass Index (BMI): 17.5 Blood Pressure (mmHg): 106/73 Capillary Blood Glucose (mg/dl): 332 Reference Range: 80 - 120 mg / dl Electronic Signature(s) Signed: 09/12/2022 12:23:56 PM By: Karl Bales EMT Entered By: Karl Bales on 09/12/2022 12:23:56

## 2022-09-13 ENCOUNTER — Encounter (HOSPITAL_BASED_OUTPATIENT_CLINIC_OR_DEPARTMENT_OTHER): Payer: BC Managed Care – PPO | Admitting: Internal Medicine

## 2022-09-13 DIAGNOSIS — E10621 Type 1 diabetes mellitus with foot ulcer: Secondary | ICD-10-CM

## 2022-09-13 DIAGNOSIS — M86671 Other chronic osteomyelitis, right ankle and foot: Secondary | ICD-10-CM

## 2022-09-13 DIAGNOSIS — L97514 Non-pressure chronic ulcer of other part of right foot with necrosis of bone: Secondary | ICD-10-CM | POA: Diagnosis not present

## 2022-09-13 LAB — GLUCOSE, CAPILLARY
Glucose-Capillary: 257 mg/dL — ABNORMAL HIGH (ref 70–99)
Glucose-Capillary: 153 mg/dL — ABNORMAL HIGH (ref 70–99)

## 2022-09-13 NOTE — Progress Notes (Signed)
PREET, MANGANO ([id number]) [id number] Page 1 of 2 Visit Report for 09/13/2022 Arrival Information Details Patient Name: Date of Service: TIRA, LAFFERTY 09/13/2022 10:00 A M Medical Record Number: [id number] Patient Account Number: [id number] Date of Birth/Sex: Treating RN: 23-Nov-1965 (57 y.o. [name], [name] Primary Care Ranard Harte: [name] Other Clinician: Karl Bales Referring Walaa Carel: Treating Orvell Careaga/Extender: [name], [name]: 9 Visit Information History Since Last Visit All ordered tests and consults were completed: Yes Patient Arrived: Wheel Chair Added or deleted any medications: No Arrival Time: 09:33 Any new allergies or adverse reactions: No Accompanied By: None Had a fall or experienced change in No Transfer Assistance: None activities of daily living that may affect Patient Identification Verified: Yes risk of falls: Secondary Verification Process Completed: Yes Signs or symptoms of abuse/neglect since last visito No Patient Requires Transmission-Based Precautions: No Hospitalized since last visit: No Patient Has Alerts: No Implantable device outside of the clinic excluding No cellular tissue based products placed in the center since last visit: Pain Present Now: No Electronic Signature(s) Signed: 09/13/2022 1:39:01 PM By: Karl Bales EMT Entered By: Karl Bales on 09/13/2022 13:39:01 -------------------------------------------------------------------------------- Encounter Discharge Information Details Patient Name: Date of Service: [name] 09/13/2022 10:00 A M Medical Record Number: [id number] Patient Account Number: [id number] Date of Birth/Sex: Treating RN: 04-09-1965 (57 y.o. [name] Primary Care Jamayia Croker: [name] Other Clinician: Referring Kida Digiulio: Treating Jisselle Poth/Extender: [name], [name]: 9 Encounter  Discharge Information Items Discharge Condition: Stable Ambulatory Status: Wheelchair Discharge Destination: Other (Note Required) Transportation: Private Auto Accompanied By: None Schedule Follow-up Appointment: No Clinical Summary of Care: Notes The patient discharged to the [facility]. Electronic Signature(s) Signed: 09/13/2022 1:43:32 PM By: Karl Bales EMT Entered By: Karl Bales on 09/13/2022 13:43:32 [name] ([id number]) [id number] Page 2 of 2 -------------------------------------------------------------------------------- Vitals Details Patient Name: Date of Service: ALFREDA, HAMMAD 09/13/2022 10:00 A M Medical Record Number: [id number] Patient Account Number: [id number] Date of Birth/Sex: Treating RN: Oct 15, 1965 (57 y.o. [name], [name] Primary Care Anant Agard: [name] Other Clinician: Referring Delores Thelen: Treating Ameilia Rattan/Extender: [name], [name]: 9 Vital Signs Time Taken: 09:43 Temperature (F): 97.6 Height (in): 65 Pulse (bpm): 75 Weight (lbs): 105 Respiratory Rate (breaths/min): 16 Body Mass Index (BMI): 17.5 Blood Pressure (mmHg): 100/59 Capillary Blood Glucose (mg/dl): 102 Reference Range: 80 - 120 mg / dl Electronic Signature(s) Signed: 09/13/2022 1:39:30 PM By: Karl Bales EMT Entered By: Karl Bales on 09/13/2022 13:39:30

## 2022-09-13 NOTE — Progress Notes (Signed)
[name], [name] ([id number]) [id number] Page 1 of 2 Visit Report for 09/12/2022 Problem List Details Patient Name: Date of Service: [name] [name], [name] [name] 09/12/2022 7:30 A M Medical Record Number: [id number] Patient Account Number: [id number] Date of Birth/Sex: Treating RN: October 28, 1965 (57 y.o. 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 [name], [name] Primary Care Provider: [name] Other Clinician: Karl Bales Referring Provider: Treating Provider/Extender: [name], [name]: 8 Diagnosis Coding ICD-10 Codes Code Description [phone] Non-pressure chronic ulcer of other part of right foot with necrosis of bone M86.671 Other chronic osteomyelitis, right ankle and foot E10.621 Type 1 diabetes mellitus with foot ulcer Facility Procedures RUTA, CAPECE ([id number]): CPT4 Code Description Modifier [id number]-(Facility Use Only) HBOT full body chamber,  , ICD-10 Diagnosis Description E10.621 Type 1 diabetes mellitus with foot ulcer L97.514 Non-pressure chronic ulcer of other part  of right foot with necrosis o M86.671 Other chronic osteomyelitis, right ankle and foot [id number] Page 2 of 2: Quantity 4 f bone Physician Procedures : CPT4 Code Description Modifier [phone] - WC PHYS HYPERBARIC OXYGEN THERAPY ICD-10 Diagnosis Description E10.621 Type 1 diabetes mellitus with foot ulcer 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

## 2022-09-13 NOTE — Progress Notes (Signed)
[name], [name] ([id number]) [id number] Page 1 of 2 Visit Report for 09/13/2022 Problem List Details Patient Name: Date of Service: [name] [name], [name] [name] 09/13/2022 10:00 A M Medical Record Number: [id number] Patient Account Number: [id number] Date of Birth/Sex: Treating RN: 06/26/1965 (57 y.o. 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 M86.671 Other chronic osteomyelitis, right ankle and foot 07/12/2022 No Yes E10.621 Type 1 diabetes mellitus with foot ulcer 07/12/2022 No Yes Inactive Problems Resolved Problems Electronic Signature(s) Signed: 09/13/2022 1:42:33 PM By: Karl Bales EMT Signed: 09/13/2022 4:51:26 PM By: [name] DO Entered By: Karl Bales on 09/13/2022 13:42:33 -------------------------------------------------------------------------------- SuperBill Details Patient Name: Date of Service: [name] 09/13/2022 Medical Record Number: [id number] Patient Account Number: [id number] Date of Birth/Sex: Treating RN: September 03, 1965 (57 y.o. [name], [name] Primary Care Provider: [name] Other Clinician: Referring Provider: Treating Provider/Extender: [name], [name]: 9 Diagnosis Coding ICD-10 Codes Code Description L97.514 Non-pressure chronic ulcer of other part of right foot with necrosis of bone M86.671 Other chronic osteomyelitis, right ankle and foot E10.621 Type 1 diabetes mellitus with foot ulcer Facility Procedures RITISHA, DEITRICK ([id number]): CPT4 Code Description Modifier [id number] G0277-(Facility Use Only) HBOT full body chamber, , ICD-10 Diagnosis Description  E10.621 Type 1 diabetes mellitus with foot ulcer L97.514 Non-pressure chronic ulcer of other  part of right foot with necrosis of M86.671 Other chronic osteomyelitis, right ankle and foot [id number] Page 2 of 2: uantity 4 bone Physician Procedures : CPT4 Code Description Modifier [phone] - WC PHYS HYPERBARIC OXYGEN THERAPY ICD-10 Diagnosis Description E10.621 Type 1 diabetes mellitus with foot ulcer L97.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

## 2022-09-13 NOTE — Progress Notes (Addendum)
[name], [name] ([id number]) [id number] Page 1 of 2 Visit Report for 09/13/2022 HBO Details Patient Name: Date of Service: [name] [name], [name] [name] 09/13/2022 10:00 A M Medical Record Number: [id number] Patient Account Number: [id number] Date of Birth/Sex: Treating RN: 1965/07/10 (57 y.o. [name], [name] Primary Care Shyanne Mcclary: [name] Other Clinician: Referring Anh Bigos: Treating Jolicia Delira/Extender: [name], [name]: 9 HBO Treatment Course Details Treatment Course Number: 2 Ordering Rudell Ortman: [name] T Treatments Ordered: otal 40 HBO Treatment Start Date: 07/18/2022 HBO Indication: Diabetic Ulcer(s) of the Lower Extremity Standard/Conservative Wound Care tried and failed greater than or equal to 30 days Wound #4 Right Calcaneus HBO Treatment Details Treatment Number: 38 Patient Type: Outpatient Chamber Type: Monoplace Chamber Serial #: [id number] Treatment Protocol: 2.0 ATA with 90 minutes oxygen, with two 5 minute air breaks Treatment Details Compression Rate Down: 2.0 psi / minute De-Compression Rate Up: 2.0 psi / minute A breaks and breathing ir Compress Tx Pressure periods Decompress Decompress Begins Reached (leave unused spaces Begins Ends blank) Chamber Pressure (ATA 1 2 2 2 2 2  --2 1 ) Clock Time (24 hr) 10:20 10:28 10:58 11:03 11:33 11:38 - - 12:08 12:15 Treatment Length: 115 (minutes) Treatment Segments: 4 Vital Signs Capillary Blood Glucose Reference Range: 80 - 120 mg / dl HBO Diabetic Blood Glucose Intervention Range: <131 mg/dl or >308 mg/dl Time Vitals Blood Respiratory Capillary Blood Glucose Pulse Action Type: Pulse: Temperature: Taken: Pressure: Rate: Glucose (mg/dl): Meter #: Oximetry (%) Taken: Pre 09:43 100/59 75 16 97.6 257 Post 12:17 131/83 78 16 97.6 153 Treatment Response Treatment Toleration: Well Treatment Completion Status: Treatment Completed without Adverse  Event Additional Procedure Documentation Tissue Sevierity: Necrosis of bone Physician HBO Attestation: I certify that I supervised this HBO treatment in accordance with Medicare guidelines. A trained emergency response team is readily available per Yes hospital policies and procedures. Continue HBOT as ordered. Yes Electronic Signature(s) Signed: 09/13/2022 4:54:40 PM By: [name] DO Previous Signature: 09/13/2022 1:42:08 PM Version By: Karl Bales EMT Previous Signature: 09/13/2022 4:51:26 PM Version By: [name] DO Entered By: [name] on 09/13/2022 16:53:38 [name] ([id number]) [id number] Page 2 of 2 -------------------------------------------------------------------------------- HBO Safety Checklist Details Patient Name: Date of Service: [name], [name] 09/13/2022 10:00 A M Medical Record Number: [id number] Patient Account Number: [id number] Date of Birth/Sex: Treating RN: 06/21/1965 (57 y.o. [name], [name] Primary Care Shevelle Smither: [name] Other Clinician: Referring Jimy Gates: Treating Astou Lada/Extender: [name], [name]: 9 HBO Safety Checklist Items Safety Checklist Consent Form Signed Patient voided / foley secured and emptied When did you last eato 0800 Last dose of injectable or oral agent 0800 Ostomy pouch emptied and vented if applicable NA All implantable devices assessed, documented and approved NA Intravenous access site secured and place NA Valuables secured Linens and cotton and cotton/polyester blend (less than 51% polyester) Personal oil-based products / skin lotions / body lotions removed Wigs or hairpieces removed Human Extensions approved Smoking or tobacco materials removed Books / newspapers / magazines / loose paper removed Cologne, aftershave, perfume and deodorant removed Jewelry removed (may wrap wedding band) Make-up removed Hair care products  removed Battery operated devices (external) removed Libre3 Heating patches and chemical warmers removed Titanium eyewear removed NA Nail polish cured greater than 10 hours Over 10 hours old Casting material cured greater than 10 hours NA Hearing aids removed NA Loose dentures or partials removed NA Prosthetics have been removed  NA Patient demonstrates correct use of air break device (if applicable) Patient concerns have been addressed Patient grounding bracelet on and cord attached to chamber Specifics for Inpatients (complete in addition to above) Medication sheet sent with patient NA Intravenous medications needed or due during therapy sent with patient NA Drainage tubes (e.g. nasogastric tube or chest tube secured and vented) NA Endotracheal or Tracheotomy tube secured NA Cuff deflated of air and inflated with saline NA Airway suctioned NA Notes The safety checklist was done before the treatment was started. Electronic Signature(s) Signed: 09/13/2022 1:40:47 PM By: Karl Bales EMT Entered By: Karl Bales on 09/13/2022 13:40:47

## 2022-09-14 ENCOUNTER — Encounter (HOSPITAL_BASED_OUTPATIENT_CLINIC_OR_DEPARTMENT_OTHER): Payer: BC Managed Care – PPO | Admitting: Physician Assistant

## 2022-09-14 DIAGNOSIS — E10621 Type 1 diabetes mellitus with foot ulcer: Secondary | ICD-10-CM | POA: Diagnosis not present

## 2022-09-14 LAB — GLUCOSE, CAPILLARY
Glucose-Capillary: 185 mg/dL — ABNORMAL HIGH (ref 70–99)
Glucose-Capillary: 133 mg/dL — ABNORMAL HIGH (ref 70–99)

## 2022-09-14 NOTE — Progress Notes (Addendum)
[name], [name] ([id number]) [id number] Page 1 of 2 Visit Report for 09/14/2022 HBO Details Patient Name: Date of Service: [name] [name], [name] [name] 09/14/2022 10:00 A M Medical Record Number: [id number] Patient Account Number: [id number] Date of Birth/Sex: Treating RN: 1965/08/28 (57 y.o. Fredderick [name] Primary Care Abdelaziz Westenberger: [name] Other Clinician: Haywood Pao Referring Lanyia Jewel: Treating Aayushi Solorzano/Extender: Alford Highland, [name]: 9 HBO Treatment Course Details Treatment Course Number: 2 Ordering Asante Ritacco: [name] T Treatments Ordered: otal 60 HBO Treatment Start Date: 07/18/2022 HBO Indication: Diabetic Ulcer(s) of the Lower Extremity Standard/Conservative Wound Care tried and failed greater than or equal to 30 days HBO Treatment Details Treatment Number: 39 Patient Type: Outpatient Chamber Type: Monoplace Chamber Serial #: [id number] Treatment Protocol: 2.0 ATA with 90 minutes oxygen, with two 5 minute air breaks Treatment Details Compression Rate Down: 1.5 psi / minute De-Compression Rate Up: 2.0 psi / minute A breaks and breathing ir Compress Tx Pressure periods Decompress Decompress Begins Reached (leave unused spaces Begins Ends blank) Chamber Pressure (ATA 1 2 2 2 2 2  --2 1 ) Clock Time (24 hr) 10:18 10:32 11:02 11:07 11:37 11:42 - - 12:12 12:22 Treatment Length: 124 (minutes) Treatment Segments: 4 Vital Signs Capillary Blood Glucose Reference Range: 80 - 120 mg / dl HBO Diabetic Blood Glucose Intervention Range: <131 mg/dl or >308 mg/dl Type: Time Vitals Blood Respiratory Capillary Blood Glucose Pulse Action Pulse: Temperature: Taken: Pressure: Rate: Glucose (mg/dl): Meter #: Oximetry (%) Taken: Pre 10:06 126/91 75 18 97.5 185 none per protocol Post 12:25 125/91 73 18 97.8 133 none per protocol Treatment Response Treatment Toleration: Well Treatment Completion Status: Treatment  Completed without Adverse Event Treatment Notes [name] arrived with vital signs within normal range. She prepared for treatment. From experience with the patient's blood glucose level, and the blood glucose level for today, 8 oz of orange juice was sent in with the patient for safety. Patient stated that she ate a glucose tab as well while preparing for treatment. After performing a safety check, she was placed in the chamber which was compressed with 100% oxygen at rate set 2 psi/min after confirming normal ear equalization. She tolerated the treatment and subsequent decompression at a rate of 2 psi/min. She did not consume the orange juice taken into the chamber. Her post treatment vitals were within normal range, per protocol. She was stable upon discharge with her driver. Additional Procedure Documentation Tissue Sevierity: Necrosis of bone Electronic Signature(s) Signed: 09/14/2022 6:10:02 PM By: Haywood Pao CHT EMT BS , , Signed: 09/26/2022 4:49:37 PM By: Allen Derry PA-C Previous Signature: 09/14/2022 3:10:43 PM Version By: Haywood Pao CHT EMT BS , , Previous Signature: 09/14/2022 4:26:17 PM Version By: Allen Derry PA-C Entered By: Haywood Pao on 09/14/2022 18:10:02 [name] ([id number]) [id number] Page 2 of 2 -------------------------------------------------------------------------------- HBO Safety Checklist Details Patient Name: Date of Service: [name], [name] 09/14/2022 10:00 A M Medical Record Number: [id number] Patient Account Number: [id number] Date of Birth/Sex: Treating RN: 1965/07/08 (57 y.o. Fredderick [name] Primary Care Zarriah Starkel: [name] Other Clinician: Haywood Pao Referring Addalee Kavanagh: Treating Jaquel Glassburn/Extender: Alford Highland, [name]: 9 HBO Safety Checklist Items Safety Checklist Consent Form Signed Patient voided / foley secured and emptied When did you last eato  0800 - Peanut butter toast, coffee, fruit Last dose of injectable or oral agent 0800 - Ostomy pouch emptied and vented if applicable NA All implantable devices  assessed, documented and approved NA Intravenous access site secured and place NA Valuables secured Linens and cotton and cotton/polyester blend (less than 51% polyester) Personal oil-based products / skin lotions / body lotions removed Wigs or hairpieces removed NA Smoking or tobacco materials removed NA Books / newspapers / magazines / loose paper removed Cologne, aftershave, perfume and deodorant removed Jewelry removed (may wrap wedding band) Make-up removed Hair care products removed Battery operated devices (external) removed Heating patches and chemical warmers removed Titanium eyewear removed Nail polish cured greater than 10 hours NA Casting material cured greater than 10 hours NA Hearing aids removed NA Loose dentures or partials removed NA Prosthetics have been removed NA Patient demonstrates correct use of air break device (if applicable) Patient concerns have been addressed Patient grounding bracelet on and cord attached to chamber Specifics for Inpatients (complete in addition to above) Medication sheet sent with patient NA Intravenous medications needed or due during therapy sent with patient NA Drainage tubes (e.g. nasogastric tube or chest tube secured and vented) NA Endotracheal or Tracheotomy tube secured NA Cuff deflated of air and inflated with saline NA Airway suctioned NA Notes Paper version used prior to treatment start. Electronic Signature(s) Signed: 09/14/2022 3:04:19 PM By: Haywood Pao CHT EMT BS , , Entered By: Haywood Pao on 09/14/2022 15:04:19

## 2022-09-14 NOTE — Progress Notes (Signed)
ROCKELL, FAULKS ([id number]) [id number] Page 1 of 1 Visit Report for 09/14/2022 SuperBill Details Patient Name: Date of Service: [name] [name], [name] [name] 09/14/2022 Medical Record Number: [id number] Patient Account Number: [id number] Date of Birth/Sex: Treating RN: 1965-10-21 (57 y.o. 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, right ankle and foot E10.621 Type 1 diabetes mellitus with foot ulcer Facility Procedures CPT4 Code Description Modifier Quantity [id number]-(Facility Use Only) HBOT full body chamber, , 4 ICD-10 Diagnosis Description E10.621 Type 1 diabetes mellitus with foot ulcer L97.514 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

## 2022-09-14 NOTE — Progress Notes (Signed)
TOSHIKA, PARROW ([id number]) [id number] Page 1 of 10 Visit Report for 09/13/2022 Arrival Information Details Patient Name: Date of Service: [name] [name], LIGHTCAP 09/13/2022 12:30 PM Medical Record Number: [id number] Patient Account Number: [id number] Date of Birth/Sex: Treating RN: [birth year] (57 y.o. [name] Primary Care Sameer Teeple: [name] Other Clinician: Referring Carlyann Placide: Treating Jamerion Cabello/Extender: [name], [name]: 9 Visit Information History Since Last Visit All ordered tests and consults were completed: Yes Patient Arrived: Wheel Chair Added or deleted any medications: No Arrival Time: 12:42 Any new allergies or adverse reactions: No Accompanied By: self Had a fall or experienced change in No Transfer Assistance: None activities of daily living that may affect Patient Identification Verified: Yes risk of falls: Secondary Verification Process Completed: Yes Signs or symptoms of abuse/neglect since last visito No Patient Requires Transmission-Based Precautions: No Hospitalized since last visit: No Patient Has Alerts: No Implantable device outside of the clinic excluding No cellular tissue based products placed in the center since last visit: Has Dressing in Place as Prescribed: Yes Pain Present Now: No Electronic Signature(s) Signed: 09/14/2022 7:59:22 AM By: Brenton Grills Entered By: Brenton Grills on 09/13/2022 12:42:55 -------------------------------------------------------------------------------- Clinic Level of Care Assessment Details Patient Name: Date of Service: [name] [name], [name] [name] 09/13/2022 12:30 PM Medical Record Number: [id number] Patient Account Number: [id number] Date of Birth/Sex: Treating RN: 1966/01/20 (57 y.o. [name] Primary Care Jayleene Glaeser: [name] Other Clinician: Referring Aking Klabunde: Treating Joyceline Maiorino/Extender: [name], YO GESH Weeks in  Treatment: 9 Clinic Level of Care Assessment Items TOOL 4 Quantity Score X- 1 0 Use when only an EandM is performed on FOLLOW-UP visit ASSESSMENTS - Nursing Assessment / Reassessment X- 1 10 Reassessment of Co-morbidities (includes updates in patient status) X- 1 5 Reassessment of Adherence to Treatment Plan ASSESSMENTS - Wound and Skin A ssessment / Reassessment X - Simple Wound Assessment / Reassessment - one wound 1 5 []  - 0 Complex Wound Assessment / Reassessment - multiple wounds []  - 0 Dermatologic / Skin Assessment (not related to wound area) ASSESSMENTS - Focused Assessment X- 1 5 Circumferential Edema Measurements - multi extremities []  - 0 Nutritional Assessment / Counseling / Intervention SUNDI, SLEVIN ([id number]) [id number] Page 2 of 10 []  - 0 Lower Extremity Assessment (monofilament, tuning fork, pulses) []  - 0 Peripheral Arterial Disease Assessment (using hand held doppler) ASSESSMENTS - Ostomy and/or Continence Assessment and Care []  - 0 Incontinence Assessment and Management []  - 0 Ostomy Care Assessment and Management (repouching, etc.) PROCESS - Coordination of Care X - Simple Patient / Family Education for ongoing care 1 15 []  - 0 Complex (extensive) Patient / Family Education for ongoing care X- 1 10 Staff obtains Chiropractor, Records, T Results / Process Orders est []  - 0 Staff telephones HHA, Nursing Homes / Clarify orders / etc []  - 0 Routine Transfer to another Facility (non-emergent condition) []  - 0 Routine Hospital Admission (non-emergent condition) []  - 0 New Admissions / Manufacturing engineer / Ordering NPWT Apligraf, etc. , []  - 0 Emergency Hospital Admission (emergent condition) X- 1 10 Simple Discharge Coordination []  - 0 Complex (extensive) Discharge Coordination PROCESS - Special Needs []  - 0 Pediatric / Minor Patient Management []  - 0 Isolation Patient Management []  - 0 Hearing / Language /  Visual special needs []  - 0 Assessment of Community assistance (transportation, D/C planning, etc.) []  - 0 Additional assistance / Altered mentation []  - 0 Support Surface(s) Assessment (bed, cushion,  seat, etc.) INTERVENTIONS - Wound Cleansing / Measurement X - Simple Wound Cleansing - one wound 1 5 []  - 0 Complex Wound Cleansing - multiple wounds X- 1 5 Wound Imaging (photographs - any number of wounds) []  - 0 Wound Tracing (instead of photographs) X- 1 5 Simple Wound Measurement - one wound []  - 0 Complex Wound Measurement - multiple wounds INTERVENTIONS - Wound Dressings X - Small Wound Dressing one or multiple wounds 1 10 []  - 0 Medium Wound Dressing one or multiple wounds []  - 0 Large Wound Dressing one or multiple wounds []  - 0 Application of Medications - topical []  - 0 Application of Medications - injection INTERVENTIONS - Miscellaneous []  - 0 External ear exam []  - 0 Specimen Collection (cultures, biopsies, blood, body fluids, etc.) []  - 0 Specimen(s) / Culture(s) sent or taken to Lab for analysis []  - 0 Patient Transfer (multiple staff / Nurse, adult / Similar devices) []  - 0 Simple Staple / Suture removal (25 or less) []  - 0 Complex Staple / Suture removal (26 or more) []  - 0 Hypo / Hyperglycemic Management (close monitor of Blood Glucose) GERYL, DOHN ([id number]) [id number] Page 3 of 10 []  - 0 Ankle / Brachial Index (ABI) - do not check if billed separately X- 1 5 Vital Signs Has the patient been seen at the hospital within the last three years: Yes Total Score: 90 Level Of Care: New/Established - Level 3 Electronic Signature(s) Signed: 09/13/2022 6:16:33 PM By: Shawn Stall RN, BSN Entered By: Shawn Stall on 09/13/2022 13:09:15 -------------------------------------------------------------------------------- Encounter Discharge Information Details Patient Name: Date of Service: [name] 09/13/2022 12:30  PM Medical Record Number: [id number] Patient Account Number: [id number] Date of Birth/Sex: Treating RN: [birth year] (57 y.o. [name] Primary Care Anjelita Sheahan: [name] Other Clinician: Referring Michaelpaul Apo: Treating Christpher Stogsdill/Extender: [name], [name]: 9 Encounter Discharge Information Items Discharge Condition: Stable Ambulatory Status: Wheelchair Discharge Destination: Home Transportation: Private Auto Accompanied By: self Schedule Follow-up Appointment: Yes Clinical Summary of Care: Electronic Signature(s) Signed: 09/13/2022 6:16:33 PM By: Shawn Stall RN, BSN Entered By: Shawn Stall on 09/13/2022 13:09:46 -------------------------------------------------------------------------------- Lower Extremity Assessment Details Patient Name: Date of Service: [name] [name], [name] [name] 09/13/2022 12:30 PM Medical Record Number: [id number] Patient Account Number: [id number] Date of Birth/Sex: Treating RN: 05-13-1965 (57 y.o. [name] Primary Care Ashrita Chrismer: [name] Other Clinician: Referring Ruthetta Koopmann: Treating Sherine Cortese/Extender: [name], [name]: 9 Edema Assessment Assessed: [Left: No] [Right: No] Edema: [Left: N] [Right: o] Calf Left: Right: Point of Measurement: From Medial Instep 26 cm Ankle Left: Right: Point of Measurement: From Medial Instep 20.5 cm Vascular Assessment Left: [[id number] Page 4 of 10Right:] Pulses: Dorsalis Pedis Palpable: [[id number] Page 4 of 10Yes] Extremity colors, hair growth, and conditions: Extremity Color: [[id number] Page 4 of 10Normal] Hair Growth on Extremity: [[id number].pdf Page 4 of 10No] Temperature of Extremity: [[id number].pdf Page 4 of 10Warm] Capillary Refill: [[id number].pdf Page 4 of 10< 3  seconds] Dependent Rubor: [[id number] Page 4 of 10No] Blanched when Elevated: [[id number] Page 4 of 10No No] Toe Nail Assessment Left: Right: Thick: No Discolored: No Deformed: No Improper Length and Hygiene: No Electronic Signature(s) Signed: 09/14/2022 7:59:22 AM By: Brenton Grills Entered By: Brenton Grills on 09/13/2022 12:47:58 -------------------------------------------------------------------------------- Multi Wound Chart Details Patient Name: Date of Service: [name] 09/13/2022 12:30 PM Medical Record Number: [id number] Patient Account Number: [id number] Date of Birth/Sex: Treating RN: 1966/02/10 (57 y.o. F)  Primary Care Zahli Vetsch: [name] Other Clinician: Referring Lorae Roig: Treating Charmaine Placido/Extender: [name], [name]: 9 Vital Signs Height(in): 65 Capillary Blood Glucose(mg/dl): 409 Weight(lbs): 811 Pulse(bpm): 79 Body Mass Index(BMI): 17.5 Blood Pressure(mmHg): 135/80 Temperature(F): 97.9 Respiratory Rate(breaths/min): 18 [4:Photos:] [N/A:N/A] Right Calcaneus N/A N/A Wound Location: Gradually Appeared N/A N/A Wounding Event: Diabetic Wound/Ulcer of the Lower N/A N/A Primary Etiology: Extremity Hypertension, Type I Diabetes, N/A N/A Comorbid History: Osteoarthritis, Osteomyelitis, Neuropathy 05/30/2022 N/A N/A Date Acquired: 9 N/A N/A Weeks of Treatment: Open N/A N/A Wound Status: No N/A N/A Wound Recurrence: 1.4x0.8x0.3 N/A N/A Measurements L x W x D (cm) 0.88 N/A N/A A (cm) : rea 0.264 N/A N/A Volume (cm) : 89.90% N/A N/A % Reduction in Area: 89.90% N/A N/A % Reduction in Volume: Grade 2 N/A N/A Classification: MALOREE, UPLINGER ([id number]) [id number] Page 5 of 10 Medium N/A N/A Exudate Amount: Serosanguineous N/A N/A Exudate Type: red, brown N/A N/A Exudate Color: Distinct, outline attached N/A N/A Wound  Margin: Large (67-100%) N/A N/A Granulation Amount: Red N/A N/A Granulation Quality: None Present (0%) N/A N/A Necrotic Amount: Fat Layer (Subcutaneous Tissue): Yes N/A N/A Exposed Structures: Fascia: No Tendon: No Muscle: No Joint: No Bone: No Medium (34-66%) N/A N/A Epithelialization: No Abnormalities Noted N/A N/A Periwound Skin Texture: Maceration: Yes N/A N/A Periwound Skin Moisture: No Abnormalities Noted N/A N/A Periwound Skin Color: Hot N/A N/A Temperature: Treatment Notes Wound #4 (Calcaneus) Wound Laterality: Right Cleanser Dakins Discharge Instruction: cleanse with Dakin's Peri-Wound Care Topical Primary Dressing Hydrofera Blue Classic Foam, 4x4 in Discharge Instruction: Moisten with saline prior to applying to wound bed Secondary Dressing Woven Gauze Sponge, Non-Sterile 4x4 in Discharge Instruction: Apply over primary dressing as directed. Zetuvit Plus Silicone Border Dressing 5x5 (in/in) Discharge Instruction: Apply silicone border over primary dressing as directed. Secured With Compression Wrap Compression Stockings Add-Ons Electronic Signature(s) Signed: 09/13/2022 4:51:26 PM By: [name] DO Entered By: [name] on 09/13/2022 13:22:41 -------------------------------------------------------------------------------- Multi-Disciplinary Care Plan Details Patient Name: Date of Service: [name] 09/13/2022 12:30 PM Medical Record Number: [id number] Patient Account Number: [id number] Date of Birth/Sex: Treating RN: 02-10-1966 (57 y.o. [name], [name] Primary Care Treyveon Mochizuki: [name] Other Clinician: Referring Ghazi Rumpf: Treating Hamsini Verrilli/Extender: [name], [name]: 9 Active Inactive HBO Nursing Diagnoses: MAGENTA, SCHMIESING ([id number]) [id number] Page 6 of 10 Anxiety related to feelings of confinement associated with the hyperbaric oxygen chamber Anxiety  related to knowledge deficit of hyperbaric oxygen therapy and treatment procedures Potential for barotraumas to ears, sinuses, teeth, and lungs or cerebral gas embolism related to changes in atmospheric pressure inside hyperbaric oxygen chamber Potential for oxygen toxicity seizures related to delivery of 100% oxygen at an increased atmospheric pressure Potential for pulmonary oxygen toxicity related to delivery of 100% oxygen at an increased atmospheric pressure Goals: Patient and/or family will be able to state/discuss factors appropriate to the management of their disease process during treatment Date Initiated: 07/12/2022 Target Resolution Date: 09/30/2022 Goal Status: Active Patient will tolerate the hyperbaric oxygen therapy treatment Date Initiated: 07/12/2022 Target Resolution Date: 09/23/2022 Goal Status: Active Patient/caregiver will verbalize understanding of HBO goals, rationale, procedures and potential hazards Date Initiated: 07/12/2022 Date Inactivated: 08/12/2022 Target Resolution Date: 08/09/2022 Goal Status: Met Interventions: Administer a five (5) minute air break for patient if signs and symptoms of seizure appear and notify the hyperbaric physician Administer a ten (10) minute air break for patient if signs and  symptoms of seizure appear and notify the hyperbaric physician Administer decongestants, per physician orders, prior to HBO2 Administer the correct therapeutic gas delivery based on the patients needs and limitations, per physician order Assess and provide for patients comfort related to the hyperbaric environment and equalization of middle ear Assess for signs and symptoms related to adverse events, including but not limited to confinement anxiety, pneumothorax, oxygen toxicity and baurotrauma Assess patient for any history of confinement anxiety Assess patient's knowledge and expectations regarding hyperbaric medicine and provide education related to the hyperbaric  environment, goals of treatment and prevention of adverse events Implement protocols to decrease risk of pneumothorax in high risk patients Notes: Nutrition Nursing Diagnoses: Imbalanced nutrition Impaired glucose control: actual or potential Goals: Patient/caregiver verbalizes understanding of need to maintain therapeutic glucose control per primary care physician Date Initiated: 07/12/2022 Target Resolution Date: 08/22/2022 Goal Status: Active Patient/caregiver will maintain therapeutic glucose control Date Initiated: 07/12/2022 Target Resolution Date: 09/23/2022 Goal Status: Active Interventions: Assess HgA1c results as ordered upon admission and as needed Assess patient nutrition upon admission and as needed per policy Provide education on elevated blood sugars and impact on wound healing Provide education on nutrition Treatment Activities: Education provided on Nutrition : 07/12/2022 Notes: Osteomyelitis Nursing Diagnoses: Infection: osteomyelitis Knowledge deficit related to disease process and management Goals: Patient/caregiver will verbalize understanding of disease process and disease management Date Initiated: 07/12/2022 Date Inactivated: 08/12/2022 Target Resolution Date: 08/18/2022 Goal Status: Met Patient's osteomyelitis will resolve Date Initiated: 07/12/2022 Target Resolution Date: 10/28/2022 Goal Status: Active Signs and symptoms for osteomyelitis will be recognized and promptly addressed Date Initiated: 07/12/2022 Target Resolution Date: 09/30/2022 Goal Status: Active Interventions: Assess for signs and symptoms of osteomyelitis resolution every visit AURORA, RODY ([id number]) [id number] Page 7 of 10 Provide education on osteomyelitis Notes: Electronic Signature(s) Signed: 09/13/2022 6:16:33 PM By: Shawn Stall RN, BSN Entered By: Shawn Stall on 09/13/2022  12:56:59 -------------------------------------------------------------------------------- Pain Assessment Details Patient Name: Date of Service: CAMPBELL, [name] [name] 09/13/2022 12:30 PM Medical Record Number: [id number] Patient Account Number: [id number] Date of Birth/Sex: Treating RN: Jun 03, 1965 (57 y.o. [name] Primary Care Chantella Creech: [name] Other Clinician: Referring Katlynn Naser: Treating Ellianna Ruest/Extender: [name], [name]: 9 Active Problems Location of Pain Severity and Description of Pain Patient Has Paino No Site Locations Pain Management and Medication Current Pain Management: Electronic Signature(s) Signed: 09/14/2022 7:59:22 AM By: Brenton Grills Entered By: Brenton Grills on 09/13/2022 12:46:29 -------------------------------------------------------------------------------- Patient/Caregiver Education Details Patient Name: Date of Service: [name] [name] 7/16/2024andnbsp12:30 PM Medical Record Number: [id number] Patient Account Number: [id number] Date of Birth/Gender: Treating RN: 1965-06-01 (57 y.o. [name], [name] Primary Care Physician: [name] Other Clinician: Referring Physician: Treating Physician/Extender: Yates Decamp Weeks in Treatment: [address] JULIETTE, STANDRE ([id number]) [id number] Page 8 of 10 Education Provided To: Patient Education Topics Provided Wound/Skin Impairment: Handouts: Caring for Your Ulcer Methods: Explain/Verbal Responses: Reinforcements needed Electronic Signature(s) Signed: 09/13/2022 6:16:33 PM By: Shawn Stall RN, BSN Entered By: Shawn Stall on 09/13/2022 12:57:14 -------------------------------------------------------------------------------- Wound Assessment Details Patient Name: Date of Service: [name] 09/13/2022 12:30 PM Medical Record Number: [id number] Patient Account Number: [id number] Date  of Birth/Sex: Treating RN: May 09, 1965 (57 y.o. F) Primary Care Tiffanyann Deroo: [name] Other Clinician: Referring Pegi Milazzo: Treating Aveline Daus/Extender: [name], [name]: 9 Wound Status Wound Number: 4 Primary Diabetic Wound/Ulcer of the Lower Extremity Etiology: Wound Location: Right  Calcaneus Wound Status: Open Wounding Event: Gradually Appeared Comorbid Hypertension, Type I Diabetes, Osteoarthritis, Osteomyelitis, Date Acquired: 05/30/2022 History: Neuropathy Weeks Of Treatment: 9 Clustered Wound: No Photos Wound Measurements Length: (cm) 1.4 Width: (cm) 0.8 Depth: (cm) 0.3 Area: (cm) 0.88 Volume: (cm) 0.264 % Reduction in Area: 89.9% % Reduction in Volume: 89.9% Epithelialization: Medium (34-66%) Tunneling: No Undermining: No Wound Description Classification: Grade 2 Wound Margin: Distinct, outline attached Exudate Amount: Medium Exudate Type: Serosanguineous Exudate Color: red, brown Foul Odor After Cleansing: No Slough/Fibrino No Wound Bed Granulation Amount: Large (67-100%) Exposed Structure Granulation Quality: Red Fascia Exposed: No Necrotic Amount: None Present (0%) Fat Layer (Subcutaneous Tissue) ExposedEBONIE, WESTERLUND ([id number]) [id number] Page 9 of 10 Tendon Exposed: No Muscle Exposed: No Joint Exposed: No Bone Exposed: No Periwound Skin Texture Texture Color No Abnormalities Noted: Yes No Abnormalities Noted: Yes Moisture Temperature / Pain No Abnormalities Noted: No Temperature: Hot Maceration: Yes Treatment Notes Wound #4 (Calcaneus) Wound Laterality: Right Cleanser Dakins Discharge Instruction: cleanse with Dakin's Peri-Wound Care Topical Primary Dressing Hydrofera Blue Classic Foam, 4x4 in Discharge Instruction: Moisten with saline prior to applying to wound bed Secondary Dressing Woven Gauze Sponge, Non-Sterile 4x4 in Discharge Instruction: Apply over primary  dressing as directed. Zetuvit Plus Silicone Border Dressing 5x5 (in/in) Discharge Instruction: Apply silicone border over primary dressing as directed. Secured With Compression Wrap Compression Stockings Add-Ons Electronic Signature(s) Signed: 09/14/2022 7:59:22 AM By: Brenton Grills Entered By: Brenton Grills on 09/13/2022 12:49:32 -------------------------------------------------------------------------------- Vitals Details Patient Name: Date of Service: [name] 09/13/2022 12:30 PM Medical Record Number: [id number] Patient Account Number: [id number] Date of Birth/Sex: Treating RN: Feb 01, 1966 (57 y.o. [name] Primary Care Lamberto Dinapoli: [name] Other Clinician: Referring Latana Colin: Treating Henchy Mccauley/Extender: [name], [name]: 9 Vital Signs Time Taken: 12:42 Temperature (F): 97.9 Height (in): 65 Pulse (bpm): 79 Weight (lbs): 105 Respiratory Rate (breaths/min): 18 Body Mass Index (BMI): 17.5 Blood Pressure (mmHg): 135/80 Capillary Blood Glucose (mg/dl): 629 Reference Range: 80 - 120 mg / dl Electronic Signature(s) Signed: 09/14/2022 7:59:22 AM By: Vella Kohler, Unknown Foley D ([id number]) [id number] Page 10 of 10 Entered By: Brenton Grills on 09/13/2022 12:46:21

## 2022-09-14 NOTE — Progress Notes (Signed)
[name] [name] ([id number]) [id number] Page 1 of 2 Visit Report for 09/14/2022 Arrival Information Details Patient Name: Date of Service: [name] [name], [name] [name] 09/14/2022 10:00 A M Medical Record Number: [id number] Patient Account Number: [id number] Date of Birth/Sex: Treating RN: May 20, 1965 (57 y.o. [name] Primary Care Copeland Lapier: [name] Other Clinician: Haywood Pao Referring Hendy Brindle: Treating [place]/Extender: Alford Highland, [name]: 9 Visit Information History Since Last Visit All ordered tests and consults were completed: Yes Patient Arrived: Wheel Chair Added or deleted any medications: No Arrival Time: 09:30 Any new allergies or adverse reactions: No Accompanied By: driver Had a fall or experienced change in No Transfer Assistance: None activities of daily living that may affect Patient Identification Verified: Yes risk of falls: Secondary Verification Process Completed: Yes Signs or symptoms of abuse/neglect since last visito No Patient Requires Transmission-Based Precautions: No Hospitalized since last visit: No Patient Has Alerts: No Implantable device outside of the clinic excluding No cellular tissue based products placed in the center since last visit: Pain Present Now: No Electronic Signature(s) Signed: 09/14/2022 3:11:03 PM By: Haywood Pao CHT EMT BS , , Previous Signature: 09/14/2022 3:02:14 PM Version By: Haywood Pao CHT EMT BS , , Previous Signature: 09/14/2022 2:59:50 PM Version By: Haywood Pao CHT EMT BS , , Entered By: Haywood Pao on 09/14/2022 15:11:03 -------------------------------------------------------------------------------- Encounter Discharge Information Details Patient Name: Date of Service: [name] Milo D. 09/14/2022 10:00 A M Medical Record Number: [id number] Patient Account Number: [id number] Date of Birth/Sex: Treating RN: 06/07/1965  (57 y.o. [name] Primary Care Nianna Igo: [name] Other Clinician: Haywood Pao Referring Trysta Showman: Treating Tondalaya Perren/Extender: Alford Highland, [name]: 9 Encounter Discharge Information Items Discharge Condition: Stable Ambulatory Status: Wheelchair Discharge Destination: Home Transportation: Other Accompanied By: driver Schedule Follow-up Appointment: No Clinical Summary of Care: Electronic Signature(s) Signed: 09/14/2022 3:11:32 PM By: Haywood Pao CHT EMT BS , , Entered By: Haywood Pao on 09/14/2022 15:11:32 [name] ([id number]) [id number] Page 2 of 2 -------------------------------------------------------------------------------- Vitals Details Patient Name: Date of Service: [name] [name] 09/14/2022 10:00 A M Medical Record Number: [id number] Patient Account Number: [id number] Date of Birth/Sex: Treating RN: [birth year] (57 y.o. [name] Primary Care Nakiya Rallis: [name] Other Clinician: Haywood Pao Referring Cortez Steelman: Treating Lochlan Grygiel/Extender: Alford Highland, [name]: 9 Vital Signs Time Taken: 10:06 Temperature (F): 97.5 Height (in): 65 Pulse (bpm): 75 Weight (lbs): 105 Respiratory Rate (breaths/min): 18 Body Mass Index (BMI): 17.5 Blood Pressure (mmHg): 126/91 Capillary Blood Glucose (mg/dl): 725 Reference Range: 80 - 120 mg / dl Electronic Signature(s) Signed: 09/14/2022 3:02:42 PM By: Haywood Pao CHT EMT BS , , Entered By: Haywood Pao on 09/14/2022 15:02:42

## 2022-09-15 ENCOUNTER — Encounter (HOSPITAL_BASED_OUTPATIENT_CLINIC_OR_DEPARTMENT_OTHER): Payer: BC Managed Care – PPO | Admitting: Internal Medicine

## 2022-09-15 DIAGNOSIS — E10621 Type 1 diabetes mellitus with foot ulcer: Secondary | ICD-10-CM | POA: Diagnosis not present

## 2022-09-15 LAB — GLUCOSE, CAPILLARY
Glucose-Capillary: 190 mg/dL — ABNORMAL HIGH (ref 70–99)
Glucose-Capillary: 98 mg/dL (ref 70–99)

## 2022-09-15 NOTE — Progress Notes (Signed)
AVONELLE, VIVEROS ([id number]) [id number] Page 1 of 2 Visit Report for 09/15/2022 Arrival Information Details Patient Name: Date of Service: [name] [name], [name] [name] 09/15/2022 10:00 A M Medical Record Number: [id number] Patient Account Number: [id number] Date of Birth/Sex: Treating RN: 06-01-1965 (57 y.o. [name] [name], [name] [name] Primary Care [name] [name]: [name] [name] Other Clinician: Haywood [name] Referring [name] [name]: Treating [name] [name]/[name] [name] [name] [name] Weeks in Treatment: 9 Visit Information History Since Last Visit All ordered tests and consults were completed: Yes Patient Arrived: Wheel Chair Added or deleted any medications: No Arrival Time: 09:33 Any new allergies or adverse reactions: No Accompanied By: father Had a fall or experienced change in No Transfer Assistance: None activities of daily living that may affect Patient Identification Verified: Yes risk of falls: Secondary Verification Process Completed: Yes Signs or symptoms of abuse/neglect since last visito No Patient Requires Transmission-Based Precautions: No Hospitalized since last visit: No Patient Has Alerts: No Implantable device outside of the clinic excluding No cellular tissue based products placed in the center since last visit: Pain Present Now: No Electronic Signature(s) Signed: 09/15/2022 1:55:51 PM By: [name] [name] CHT EMT BS , , Entered By: [name] [name] on 09/15/2022 13:55:51 -------------------------------------------------------------------------------- Encounter Discharge Information Details Patient Name: Date of Service: [name] Milo D. 09/15/2022 10:00 A M Medical Record Number: [id number] Patient Account Number: [id number] Date of Birth/Sex: Treating RN: 05/28/1965 (57 y.o. [name] [name], [name] [name] Primary Care [name] [name]: [name] [name] Other Clinician: Haywood [name] Referring [name] [name]: Treating [name] [name]/[name] [name]  [name] [name] Weeks in Treatment: 9 Encounter Discharge Information Items Discharge Condition: Stable Ambulatory Status: Wheelchair Discharge Destination: Home Transportation: Private Auto Accompanied By: father Schedule Follow-up Appointment: No Clinical Summary of Care: Electronic Signature(s) Signed: 09/15/2022 3:58:35 PM By: [name] [name] CHT EMT BS , , Entered By: [name] [name] on 09/15/2022 15:58:34 [name] ([id number]) [id number] Page 2 of 2 -------------------------------------------------------------------------------- Vitals Details Patient Name: Date of Service: [name] [name], [name] [name] 09/15/2022 10:00 A M Medical Record Number: [id number] Patient Account Number: [id number] Date of Birth/Sex: Treating RN: 1965-10-28 (57 y.o. [name] [name], [name] [name] Primary Care [name] [name]: [name] [name] Other Clinician: Haywood [name] Referring [name] [name]: Treating [name] [name]/Extender: [name] [name], [name] [name] Weeks in Treatment: 9 Vital Signs Time Taken: 09:44 Temperature (F): 98.4 Height (in): 65 Pulse (bpm): 76 Weight (lbs): 105 Respiratory Rate (breaths/min): 18 Body Mass Index (BMI): 17.5 Blood Pressure (mmHg): 113/74 Capillary Blood Glucose (mg/dl): 253 Reference Range: 80 - 120 mg / dl Electronic Signature(s) Signed: 09/15/2022 1:56:13 PM By: [name] [name] CHT EMT BS , , Entered By: [name] [name] on 09/15/2022 13:56:13

## 2022-09-15 NOTE — Progress Notes (Signed)
DIANNE, WHELCHEL ([id number]) [id number] Page 1 of 2 Visit Report for 09/15/2022 HBO Details Patient Name: Date of Service: [name] [name], [name] [name] 09/15/2022 10:00 A M Medical Record Number: [id number] Patient Account Number: [id number] Date of Birth/Sex: Treating RN: 08-04-1965 (57 y.o. [name], [name] Primary Care [name] [name]: [name] Other Clinician: Haywood Pao Referring [name]: Treating [name]/Extender: [name] GESH Weeks in Treatment: 9 HBO Treatment Course Details Treatment Course Number: 2 Ordering Vernon Ariel: [name] T Treatments Ordered: otal 60 HBO Treatment Start Date: 07/18/2022 HBO Indication: Diabetic Ulcer(s) of the Lower Extremity Standard/Conservative Wound Care tried and failed greater than or equal to 30 days HBO Treatment Details Treatment Number: 40 Patient Type: Outpatient Chamber Type: Monoplace Chamber Serial #: [id number] Treatment Protocol: 2.0 ATA with 90 minutes oxygen, with two 5 minute air breaks Treatment Details Compression Rate Down: 1.5 psi / minute De-Compression Rate Up: 2.0 psi / minute A breaks and breathing ir Compress Tx Pressure periods Decompress Decompress Begins Reached (leave unused spaces Begins Ends blank) Chamber Pressure (ATA 1 2 2 2 2 2  --2 1 ) Clock Time (24 hr) 10:16 10:28 10:58 11:03 11:33 11:38 - - 12:08 12:14 Treatment Length: 118 (minutes) Treatment Segments: 4 Vital Signs Capillary Blood Glucose Reference Range: 80 - 120 mg / dl HBO Diabetic Blood Glucose Intervention Range: <131 mg/dl or >308 mg/dl Type: Time Vitals Blood Pulse: Respiratory Temperature: Capillary Blood Glucose Pulse Action Taken: Pressure: Rate: Glucose (mg/dl): Meter #: Oximetry (%) Taken: Pre 09:44 113/74 76 18 98.4 190 none per protocol Post 12:21 131/77 72 18 98.1 98 Patient given 8 oz Glucerna Shake, per protocol. Treatment Response Treatment Toleration: Well Treatment Completion  Status: Treatment Completed without Adverse Event Treatment Notes [name] [name] arrived with vital signs within normal range. She prepared for treatment. From experience with the patient's blood glucose level, and the blood glucose level for today, 8 oz of orange juice was sent in with the patient for safety. After performing a safety check, she was placed in the chamber which was compressed with 100% oxygen at rate set 2 psi/min after confirming normal ear equalization. She tolerated the treatment and subsequent decompression at a rate of 2 psi/min. She did not consume the orange juice taken into the chamber. Her post treatment blood glucose level was 98 mg/dL. She was given 8 oz Glucerna. After approximately 15 minutes, patient was asymptomatic for hypoglycemia. She was stable upon discharge with her father. Additional Procedure Documentation Tissue Sevierity: Necrosis of bone Audrey Thull Notes No concerns with treatment given Physician HBO Attestation: I certify that I supervised this HBO treatment in accordance with Medicare guidelines. A trained emergency response team is readily available per Yes hospital policies and procedures. Continue HBOT as ordered. [address] [name] [name], [name] [name] ([id number]) [id number] Page 2 of 2 Electronic Signature(s) Signed: 09/19/2022 10:28:49 AM By: Baltazar Najjar MD Previous Signature: 09/15/2022 3:57:43 PM Version By: Haywood Pao CHT EMT BS , , Previous Signature: 09/15/2022 3:56:35 PM Version By: Haywood Pao CHT EMT BS , , Previous Signature: 09/15/2022 2:33:35 PM Version By: Haywood Pao CHT EMT BS , , Entered By: Baltazar Najjar on 09/19/2022 95:28:41 -------------------------------------------------------------------------------- HBO Safety Checklist Details Patient Name: Date of Service: [name] Milo D. 09/15/2022 10:00 A M Medical Record Number: [id number] Patient Account Number: [id number] Date of Birth/Sex: Treating  RN: [birth year] (57 y.o. [name], [name] Primary Care Minh Jasper: [name] Other Clinician: Haywood Pao Referring Ashlea Dusing: Treating Iran Kievit/Extender: Deberah Pelton, YO GESH Tania Ade  in Treatment: 9 HBO Safety Checklist Items Safety Checklist Consent Form Signed Patient voided / foley secured and emptied When did you last eato 0800 - PB Toast, Fruit, Coffee non sweet Last dose of injectable or oral agent 0800- Humalog 5 U Lantis Last Night Ostomy pouch emptied and vented if applicable NA All implantable devices assessed, documented and approved NA Libre 3 on Approved Items List Intravenous access site secured and place NA Valuables secured Linens and cotton and cotton/polyester blend (less than 51% polyester) Personal oil-based products / skin lotions / body lotions removed Wigs or hairpieces removed NA Smoking or tobacco materials removed NA Books / newspapers / magazines / loose paper removed Cologne, aftershave, perfume and deodorant removed Jewelry removed (may wrap wedding band) Make-up removed Hair care products removed Libre 3 sensor Approved, otherwise all Battery operated devices (external) removed electronics removed. Heating patches and chemical warmers removed Titanium eyewear removed Nail polish cured greater than 10 hours Casting material cured greater than 10 hours Hearing aids removed Loose dentures or partials removed NA Prosthetics have been removed NA Patient demonstrates correct use of air break device (if applicable) Patient concerns have been addressed Patient grounding bracelet on and cord attached to chamber Specifics for Inpatients (complete in addition to above) Medication sheet sent with patient NA Intravenous medications needed or due during therapy sent with patient NA Drainage tubes (e.g. nasogastric tube or chest tube secured and vented) NA Endotracheal or Tracheotomy tube secured NA Cuff deflated of air and  inflated with saline NA Airway suctioned NA Notes Paper version used prior to treatment start. Electronic Signature(s) Signed: 09/15/2022 2:10:57 PM By: Haywood Pao CHT EMT BS , , Entered By: Haywood Pao on 09/15/2022 14:10:56

## 2022-09-16 ENCOUNTER — Encounter (HOSPITAL_BASED_OUTPATIENT_CLINIC_OR_DEPARTMENT_OTHER): Payer: BC Managed Care – PPO | Admitting: Internal Medicine

## 2022-09-16 DIAGNOSIS — M86671 Other chronic osteomyelitis, right ankle and foot: Secondary | ICD-10-CM | POA: Diagnosis not present

## 2022-09-16 DIAGNOSIS — E10621 Type 1 diabetes mellitus with foot ulcer: Secondary | ICD-10-CM

## 2022-09-16 DIAGNOSIS — L97514 Non-pressure chronic ulcer of other part of right foot with necrosis of bone: Secondary | ICD-10-CM

## 2022-09-16 LAB — GLUCOSE, CAPILLARY
Glucose-Capillary: 349 mg/dL — ABNORMAL HIGH (ref 70–99)
Glucose-Capillary: 169 mg/dL — ABNORMAL HIGH (ref 70–99)

## 2022-09-17 NOTE — Progress Notes (Signed)
ELYSHIA, KUMAGAI ([id number]) [id number] Page 1 of 2 Visit Report for 09/16/2022 Arrival Information Details Patient Name: Date of Service: [name] [name], [name] [name] 09/16/2022 8:00 A M Medical Record Number: [id number] Patient Account Number: [id number] Date of Birth/Sex: Treating RN: 03-Sep-1965 (57 y.o. Katrinka Blazing Primary Care Saje Gallop: [name] Other Clinician: Haywood Pao Referring Sheli Dorin: Treating Alexsia Klindt/Extender: [name], [name]: 9 Visit Information History Since Last Visit All ordered tests and consults were completed: Yes Patient Arrived: Wheel Chair Added or deleted any medications: No Arrival Time: 07:35 Any new allergies or adverse reactions: No Accompanied By: spouse Had a fall or experienced change in No Transfer Assistance: None activities of daily living that may affect Patient Identification Verified: Yes risk of falls: Secondary Verification Process Completed: Yes Signs or symptoms of abuse/neglect since last visito No Patient Requires Transmission-Based Precautions: No Hospitalized since last visit: No Patient Has Alerts: No Implantable device outside of the clinic excluding No cellular tissue based products placed in the center since last visit: Pain Present Now: No Electronic Signature(s) Signed: 09/16/2022 3:23:55 PM By: Haywood Pao CHT EMT BS , , Entered By: Haywood Pao on 09/16/2022 15:23:55 -------------------------------------------------------------------------------- Encounter Discharge Information Details Patient Name: Date of Service: [name] Milo D. 09/16/2022 8:00 A M Medical Record Number: [id number] Patient Account Number: [id number] Date of Birth/Sex: Treating RN: [birth year] (57 y.o. Katrinka Blazing Primary Care Marycarmen Hagey: [name] Other Clinician: Karl Bales Referring Calianna Kim: Treating Sarabeth Benton/Extender: [name], YO  GESH Weeks in Treatment: 9 Encounter Discharge Information Items Discharge Condition: Stable Ambulatory Status: Wheelchair Discharge Destination: Home Transportation: Private Auto Accompanied By: Husband Schedule Follow-up Appointment: Yes Clinical Summary of Care: Notes Delay Documation due to computer system down. Electronic Signature(s) Signed: 09/19/2022 12:44:06 PM By: Karl Bales EMT Entered By: Karl Bales on 09/19/2022 12:44:06 [name] ([id number]) [id number] Page 2 of 2 -------------------------------------------------------------------------------- Vitals Details Patient Name: Date of Service: [name] [name], [name] [name] 09/16/2022 8:00 A M Medical Record Number: [id number] Patient Account Number: [id number] Date of Birth/Sex: Treating RN: January 27, 1966 (57 y.o. Katrinka Blazing Primary Care Tylia Ewell: [name] Other Clinician: Haywood Pao Referring Kingston Guiles: Treating Giamarie Bueche/Extender: [name], [name]: 9 Vital Signs Time Taken: 07:45 Temperature (F): 97.5 Height (in): 65 Pulse (bpm): 74 Weight (lbs): 105 Respiratory Rate (breaths/min): 18 Body Mass Index (BMI): 17.5 Blood Pressure (mmHg): 96/22 Capillary Blood Glucose (mg/dl): 742 Reference Range: 80 - 120 mg / dl Notes MScammell spoke to Dr. Lady Gary about blood glucose level. Patient ate at 0630 (Breakfast Bar/Coffee) and self administered 10 Units Humalog at 0630 and 2 Units of same at 0730. Electronic Signature(s) Signed: 09/16/2022 3:26:34 PM By: Haywood Pao CHT EMT BS , , Entered By: Haywood Pao on 09/16/2022 15:26:34

## 2022-09-19 ENCOUNTER — Encounter (HOSPITAL_BASED_OUTPATIENT_CLINIC_OR_DEPARTMENT_OTHER): Payer: BC Managed Care – PPO | Admitting: Internal Medicine

## 2022-09-19 DIAGNOSIS — L97514 Non-pressure chronic ulcer of other part of right foot with necrosis of bone: Secondary | ICD-10-CM

## 2022-09-19 DIAGNOSIS — M86671 Other chronic osteomyelitis, right ankle and foot: Secondary | ICD-10-CM

## 2022-09-19 DIAGNOSIS — E10621 Type 1 diabetes mellitus with foot ulcer: Secondary | ICD-10-CM | POA: Diagnosis not present

## 2022-09-19 LAB — GLUCOSE, CAPILLARY
Glucose-Capillary: 200 mg/dL — ABNORMAL HIGH (ref 70–99)
Glucose-Capillary: 173 mg/dL — ABNORMAL HIGH (ref 70–99)

## 2022-09-19 NOTE — Progress Notes (Signed)
BETTEJANE, LEAVENS ([id number]) [id number] Page 1 of 1 Visit Report for 09/15/2022 SuperBill Details Patient Name: Date of Service: [name] [name], [name] [name] 09/15/2022 Medical Record Number: [id number] Patient Account Number: [id number] Date of Birth/Sex: Treating RN: 16-Oct-1965 (57 y.o. 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 Other chronic osteomyelitis, right ankle and foot E10.621 Type 1 diabetes mellitus with foot ulcer Facility Procedures CPT4 Code Description Modifier Quantity [id number] G0277-(Facility Use Only) HBOT full body chamber, , 4 ICD-10 Diagnosis Description E10.621 Type 1 diabetes mellitus with foot ulcer L97.514 Non-pressure chronic ulcer of other part of right foot with necrosis of bone M86.671 Other chronic osteomyelitis, right ankle and foot Physician Procedures Quantity CPT4 Code Description Modifier [id number] - WC PHYS HYPERBARIC OXYGEN THERAPY 1 ICD-10 Diagnosis Description E10.621 Type 1 diabetes mellitus with foot ulcer L97.514 Non-pressure chronic ulcer of other part of right foot with necrosis of bone M86.671 Other chronic osteomyelitis, right ankle and foot Electronic Signature(s) Signed: 09/15/2022 3:58:05 PM By: Haywood Pao CHT EMT BS , , Signed: 09/19/2022 10:28:49 AM By: Baltazar Najjar MD Entered By: Haywood Pao on 09/15/2022 15:58:05

## 2022-09-19 NOTE — Progress Notes (Signed)
[name] [name] ([id number]) [id number] Page 1 of 2 Visit Report for 09/16/2022 HBO Details Patient Name: Date of Service: [name] [name], [name] [name] 09/16/2022 8:00 A M Medical Record Number: [id number] Patient Account Number: [id number] Date of Birth/Sex: Treating RN: August 12, 1965 (57 y.o. Katrinka Blazing Primary Care Rainie Crenshaw: [name] Other Clinician: Karl Bales Referring Ervie Mccard: Treating Yobany Vroom/Extender: [name], [name]: 9 HBO Treatment Course Details Treatment Course Number: 2 Ordering Erian Rosengren: [name] T Treatments Ordered: otal 60 HBO Treatment Start Date: 07/18/2022 HBO Indication: Diabetic Ulcer(s) of the Lower Extremity Standard/Conservative Wound Care tried and failed greater than or equal to 30 days HBO Treatment Details Treatment Number: 41 Patient Type: Outpatient Chamber Type: Monoplace Chamber Serial #: [id number] Treatment Protocol: 2.0 ATA with 90 minutes oxygen, with two 5 minute air breaks Treatment Details Compression Rate Down: 2.0 psi / minute De-Compression Rate Up: 2.0 psi / minute A breaks and breathing ir Compress Tx Pressure periods Decompress Decompress Begins Reached (leave unused spaces Begins Ends blank) Chamber Pressure (ATA 1 2 2 2 2 2  --2 1 ) Clock Time (24 hr) 08:05 08:13 08:43 08:48 09:18 09:23 - - 09:53 10:00 Treatment Length: 115 (minutes) Treatment Segments: 4 Vital Signs Capillary Blood Glucose Reference Range: 80 - 120 mg / dl HBO Diabetic Blood Glucose Intervention Range: <131 mg/dl or >846 mg/dl Time Vitals Blood Respiratory Capillary Blood Glucose Pulse Action Type: Pulse: Temperature: Taken: Pressure: Rate: Glucose (mg/dl): Meter #: Oximetry (%) Taken: Pre 07:45 96/22 74 18 97.5 349 Post 10:06 110/49 72 18 97.3 169 Treatment Response Treatment Toleration: Well Treatment Completion Status: Treatment Completed without Adverse Event Treatment  Notes Delay Documation due to computer system down. Additional Procedure Documentation Tissue Sevierity: Necrosis of bone Physician HBO Attestation: I certify that I supervised this HBO treatment in accordance with Medicare guidelines. A trained emergency response team is readily available per Yes hospital policies and procedures. Continue HBOT as ordered. Yes Electronic Signature(s) Signed: 09/20/2022 4:26:53 PM By: [name] DO Previous Signature: 09/19/2022 12:42:52 PM Version By: Karl Bales EMT Entered By: [name] on 09/19/2022 16:03:11 [name] ([id number]) [id number] Page 2 of 2 -------------------------------------------------------------------------------- HBO Safety Checklist Details Patient Name: Date of Service: [name], [name] 09/16/2022 8:00 A M Medical Record Number: [id number] Patient Account Number: [id number] Date of Birth/Sex: Treating RN: [birth year] (57 y.o. Katrinka Blazing Primary Care Reginal Wojcicki: [name] Other Clinician: Karl Bales Referring Tarra Pence: Treating Jaser Fullen/Extender: [name], [name]: 9 HBO Safety Checklist Items Safety Checklist Consent Form Signed Patient voided / foley secured and emptied When did you last eato 0630 Last dose of injectable or oral agent 0630 Ostomy pouch emptied and vented if applicable NA All implantable devices assessed, documented and approved NA Intravenous access site secured and place NA Valuables secured Linens and cotton and cotton/polyester blend (less than 51% polyester) Personal oil-based products / skin lotions / body lotions removed Wigs or hairpieces removed Human Extensions approved Smoking or tobacco materials removed Books / newspapers / magazines / loose paper removed Cologne, aftershave, perfume and deodorant removed Jewelry removed (may wrap wedding band) Make-up removed Hair care products  removed Battery operated devices (external) removed Libre3 Heating patches and chemical warmers removed Titanium eyewear removed NA Nail polish cured greater than 10 hours Over 10 hours old Casting material cured greater than 10 hours NA Hearing aids removed NA Loose dentures or partials removed NA Prosthetics have been removed NA  Patient demonstrates correct use of air break device (if applicable) Patient concerns have been addressed Patient grounding bracelet on and cord attached to chamber Specifics for Inpatients (complete in addition to above) Medication sheet sent with patient NA Intravenous medications needed or due during therapy sent with patient NA Drainage tubes (e.g. nasogastric tube or chest tube secured and vented) NA Endotracheal or Tracheotomy tube secured NA Cuff deflated of air and inflated with saline NA Airway suctioned NA Notes The safety checklist was done before the treatment was started. Delay Documation due to computer system down. Electronic Signature(s) Signed: 09/19/2022 12:40:52 PM By: Karl Bales EMT Entered By: Karl Bales on 09/19/2022 12:40:52

## 2022-09-19 NOTE — Progress Notes (Signed)
LATORIE, MONTESANO ([id number]) [id number] Page 1 of 2 Visit Report for 09/19/2022 Problem List Details Patient Name: Date of Service: [name] [name], [name] [name] 09/19/2022 8:00 A M Medical Record Number: [id number] Patient Account Number: [id number] Date of Birth/Sex: Treating RN: [birth year] (57 y.o. [name] Primary Care Provider: [name] Other Clinician: Karl Bales Referring Provider: Treating Provider/Extender: [name], [name]: 9 Active Problems ICD-10 Encounter Code Description Active Date MDM Diagnosis L97.514 Non-pressure chronic ulcer of other part of right foot with 07/12/2022 No Yes necrosis of bone M86.671 Other chronic osteomyelitis, right ankle and foot 07/12/2022 No Yes E10.621 Type 1 diabetes mellitus with foot ulcer 07/12/2022 No Yes Inactive Problems Resolved Problems Electronic Signature(s) Signed: 09/19/2022 2:30:45 PM By: Karl Bales EMT Signed: 09/19/2022 4:55:26 PM By: [name] DO Entered By: Karl Bales on 09/19/2022 14:30:45 -------------------------------------------------------------------------------- SuperBill Details Patient Name: Date of Service: [name] [name] 09/19/2022 Medical Record Number: [id number] Patient Account Number: [id number] Date of Birth/Sex: Treating RN: June 16, 1965 (57 y.o. 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.671 Other chronic osteomyelitis, right ankle and foot E10.621 Type 1 diabetes mellitus with foot ulcer Facility Procedures TORRI, MICHALSKI ([id number]): CPT4 Code Description Modifier [id number] G0277-(Facility Use Only) HBOT full body chamber,  , ICD-10 Diagnosis Description E10.621 Type 1 diabetes mellitus with foot ulcer L97.514 Non-pressure chronic ulcer of other part  of right foot with necrosis o M86.671 Other chronic osteomyelitis, right ankle and foot [id number] Page 2 of 2: Quantity 4 f bone Physician Procedures : CPT4 Code Description Modifier [phone] - WC PHYS HYPERBARIC OXYGEN THERAPY ICD-10 Diagnosis Description E10.621 Type 1 diabetes mellitus with foot ulcer L97.514 Non-pressure chronic ulcer of other part of right foot with necrosis o M86.671 Other  chronic osteomyelitis, right ankle and foot Quantity: 1 f bone Electronic Signature(s) Signed: 09/19/2022 2:30:37 PM By: Karl Bales EMT Signed: 09/19/2022 4:55:26 PM By: [name] DO Entered By: Karl Bales on 09/19/2022 14:30:36

## 2022-09-19 NOTE — Progress Notes (Signed)
[name] [name], [name] [name] ([id number]) [id number] Page 1 of 2 Visit Report for 09/19/2022 Arrival Information Details Patient Name: Date of Service: [name] [name], [name] [name] 09/19/2022 8:00 A M Medical Record Number: [id number] Patient Account Number: [id number] Date of Birth/Sex: Treating RN: 1965-06-25 (57 y.o. [name] Primary Care Amaris Delafuente: [name] Other Clinician: Karl Bales Referring Constance Whittle: Treating Jacari Iannello/Extender: [name], [name]: 9 Visit Information History Since Last Visit All ordered tests and consults were completed: Yes Patient Arrived: Wheel Chair Added or deleted any medications: No Arrival Time: 07:32 Any new allergies or adverse reactions: No Accompanied By: Husband Had a fall or experienced change in No Transfer Assistance: None activities of daily living that may affect Patient Identification Verified: Yes risk of falls: Secondary Verification Process Completed: Yes Signs or symptoms of abuse/neglect since last visito No Patient Requires Transmission-Based Precautions: No Hospitalized since last visit: No Patient Has Alerts: No Implantable device outside of the clinic excluding No cellular tissue based products placed in the center since last visit: Pain Present Now: No Electronic Signature(s) Signed: 09/19/2022 2:25:43 PM By: Karl Bales EMT Entered By: Karl Bales on 09/19/2022 14:25:43 -------------------------------------------------------------------------------- Encounter Discharge Information Details Patient Name: Date of Service: [name] Milo D. 09/19/2022 8:00 A M Medical Record Number: [id number] Patient Account Number: [id number] Date of Birth/Sex: Treating RN: 1965-05-27 (57 y.o. [name] Primary Care Ledora Delker: [name] Other Clinician: Karl Bales Referring Livie Vanderhoof: Treating Jekhi Bolin/Extender: [name], YO GESH Weeks in  Treatment: 9 Encounter Discharge Information Items Discharge Condition: Stable Ambulatory Status: Wheelchair Discharge Destination: Home Transportation: Private Auto Accompanied By: Husband Schedule Follow-up Appointment: Yes Clinical Summary of Care: Electronic Signature(s) Signed: 09/19/2022 2:31:20 PM By: Karl Bales EMT Entered By: Karl Bales on 09/19/2022 14:31:20 [name] ([id number]) [id number] Page 2 of 2 -------------------------------------------------------------------------------- Vitals Details Patient Name: Date of Service: [name] [name], [name] [name] 09/19/2022 8:00 A M Medical Record Number: [id number] Patient Account Number: [id number] Date of Birth/Sex: Treating RN: [birth year] (57 y.o. [name] Primary Care Isadora Delorey: [name] Other Clinician: Karl Bales Referring Marsia Cino: Treating Brance Dartt/Extender: [name], [name]: 9 Vital Signs Time Taken: 07:46 Temperature (F): 97.2 Height (in): 65 Pulse (bpm): 72 Weight (lbs): 105 Respiratory Rate (breaths/min): 18 Body Mass Index (BMI): 17.5 Blood Pressure (mmHg): 131/76 Capillary Blood Glucose (mg/dl): 202 Reference Range: 80 - 120 mg / dl Electronic Signature(s) Signed: 09/19/2022 2:27:07 PM By: Karl Bales EMT Entered By: Karl Bales on 09/19/2022 14:27:07

## 2022-09-19 NOTE — Progress Notes (Addendum)
CRESSIE, BETZLER ([id number]) [id number] Page 1 of 2 Visit Report for 09/19/2022 HBO Details Patient Name: Date of Service: [name] [name], [name] [name] 09/19/2022 8:00 A M Medical Record Number: [id number] Patient Account Number: [id number] Date of Birth/Sex: Treating RN: Nov 19, 1965 (57 y.o. [name], [name] Primary Care Ourania Hamler: [name] Other Clinician: Karl Bales Referring Kizzy Olafson: Treating Elkin Belfield/Extender: [name], [name]: 9 HBO Treatment Course Details Treatment Course Number: 2 Ordering Domingo Fuson: [name] T Treatments Ordered: otal 60 HBO Treatment Start Date: 07/18/2022 HBO Indication: Diabetic Ulcer(s) of the Lower Extremity Standard/Conservative Wound Care tried and failed greater than or equal to 30 days HBO Treatment Details Treatment Number: 42 Patient Type: Outpatient Chamber Type: Monoplace Chamber Serial #: [id number] Treatment Protocol: 2.0 ATA with 90 minutes oxygen, with two 5 minute air breaks Treatment Details Compression Rate Down: 2.0 psi / minute De-Compression Rate Up: 2.0 psi / minute A breaks and breathing ir Compress Tx Pressure periods Decompress Decompress Begins Reached (leave unused spaces Begins Ends blank) Chamber Pressure (ATA 1 2 2 2 2 2  --2 1 ) Clock Time (24 hr) 08:08 08:17 08:47 08:52 09:22 09:27 - - 09:58 10:05 Treatment Length: 117 (minutes) Treatment Segments: 4 Vital Signs Capillary Blood Glucose Reference Range: 80 - 120 mg / dl HBO Diabetic Blood Glucose Intervention Range: <131 mg/dl or >308 mg/dl Time Vitals Blood Respiratory Capillary Blood Glucose Pulse Action Type: Pulse: Temperature: Taken: Pressure: Rate: Glucose (mg/dl): Meter #: Oximetry (%) Taken: Pre 07:46 131/76 72 18 97.2 200 Post 10:10 149/78 69 18 98.4 173 Treatment Response Treatment Toleration: Well Treatment Completion Status: Treatment Completed without Adverse Event Additional  Procedure Documentation Tissue Sevierity: Necrosis of bone Physician HBO Attestation: I certify that I supervised this HBO treatment in accordance with Medicare guidelines. A trained emergency response team is readily available per Yes hospital policies and procedures. Continue HBOT as ordered. Yes Electronic Signature(s) Signed: 09/20/2022 4:26:53 PM By: [name] DO Previous Signature: 09/19/2022 2:30:16 PM Version By: Karl Bales EMT Previous Signature: 09/19/2022 4:55:26 PM Version By: [name] DO Entered By: [name] on 09/20/2022 12:48:15 [name] ([id number]) [id number] Page 2 of 2 -------------------------------------------------------------------------------- HBO Safety Checklist Details Patient Name: Date of Service: [name] [name], [name] [name] 09/19/2022 8:00 A M Medical Record Number: [id number] Patient Account Number: [id number] Date of Birth/Sex: Treating RN: 1965-09-23 (57 y.o. [name], [name] Primary Care Liyat Faulkenberry: [name] Other Clinician: Karl Bales Referring Sundi Slevin: Treating Daleisa Halperin/Extender: [name], [name]: 9 HBO Safety Checklist Items Safety Checklist Consent Form Signed Patient voided / foley secured and emptied When did you last eato 0700 Last dose of injectable or oral agent 0700 Ostomy pouch emptied and vented if applicable NA All implantable devices assessed, documented and approved NA Intravenous access site secured and place NA Valuables secured Linens and cotton and cotton/polyester blend (less than 51% polyester) Personal oil-based products / skin lotions / body lotions removed Wigs or hairpieces removed Human Extensions approved Smoking or tobacco materials removed Books / newspapers / magazines / loose paper removed Cologne, aftershave, perfume and deodorant removed Jewelry removed (may wrap wedding band) Make-up removed Hair care products  removed Battery operated devices (external) removed Heating patches and chemical warmers removed Titanium eyewear removed NA Nail polish cured greater than 10 hours Over 10 hours old Casting material cured greater than 10 hours NA Hearing aids removed NA Loose dentures or partials removed NA Prosthetics have been removed NA  Patient demonstrates correct use of air break device (if applicable) Patient concerns have been addressed Patient grounding bracelet on and cord attached to chamber Specifics for Inpatients (complete in addition to above) Medication sheet sent with patient NA Intravenous medications needed or due during therapy sent with patient NA Drainage tubes (e.g. nasogastric tube or chest tube secured and vented) NA Endotracheal or Tracheotomy tube secured NA Cuff deflated of air and inflated with saline NA Airway suctioned NA Notes The safety checklist was done before the treatment was started. Electronic Signature(s) Signed: 09/19/2022 2:28:40 PM By: Karl Bales EMT Entered By: Karl Bales on 09/19/2022 14:28:40

## 2022-09-20 ENCOUNTER — Encounter (HOSPITAL_BASED_OUTPATIENT_CLINIC_OR_DEPARTMENT_OTHER): Payer: BC Managed Care – PPO | Admitting: Internal Medicine

## 2022-09-20 DIAGNOSIS — E10621 Type 1 diabetes mellitus with foot ulcer: Secondary | ICD-10-CM

## 2022-09-20 DIAGNOSIS — L97514 Non-pressure chronic ulcer of other part of right foot with necrosis of bone: Secondary | ICD-10-CM

## 2022-09-20 DIAGNOSIS — M86671 Other chronic osteomyelitis, right ankle and foot: Secondary | ICD-10-CM

## 2022-09-20 LAB — GLUCOSE, CAPILLARY
Glucose-Capillary: 319 mg/dL — ABNORMAL HIGH (ref 70–99)
Glucose-Capillary: 110 mg/dL — ABNORMAL HIGH (ref 70–99)

## 2022-09-20 NOTE — Progress Notes (Addendum)
[name], [name] ([id number]) [id number] Page 1 of 2 Visit Report for 09/20/2022 Arrival Information Details Patient Name: Date of Service: [name] [name], [name] [name] 09/20/2022 10:00 A M Medical Record Number: [id number] Patient Account Number: [id number] Date of Birth/Sex: Treating RN: 1965-06-28 (57 y.o. [name] [name], [name] [name] Primary Care [name] [name]: [name] [name] Other Clinician: Referring [name] [name]: Treating [name] [name]/Extender: [name] [name], [name] [name]: 10 Visit Information History Since Last Visit All ordered tests and consults were completed: Yes Patient Arrived: Wheel Chair Added or deleted any medications: No Arrival Time: 09:43 Any new allergies or adverse reactions: No Accompanied By: driver Had a fall or experienced change in No Transfer Assistance: None activities of daily living that may affect Patient Identification Verified: Yes risk of falls: Secondary Verification Process Completed: Yes Signs or symptoms of abuse/neglect since last visito No Patient Requires Transmission-Based Precautions: No Hospitalized since last visit: No Patient Has Alerts: No Implantable device outside of the clinic excluding No cellular tissue based products placed in the center since last visit: Pain Present Now: No Electronic Signature(s) Signed: 09/20/2022 12:10:22 PM By: [name] [name] CHT EMT BS , , Entered By: [name] [name] on 09/20/2022 12:10:22 -------------------------------------------------------------------------------- Encounter Discharge Information Details Patient Name: Date of Service: [name] [name] D. 09/20/2022 10:00 A M Medical Record Number: [id number] Patient Account Number: [id number] Date of Birth/Sex: Treating RN: [birth year] (57 y.o. Fredderick [name] Primary Care Yandriel Boening: [name] [name] Other Clinician: Haywood [name] Referring Chalmer Zheng: Treating Saba Neuman/Extender: [name] [name], [name]  [name] [name] Weeks in [name]: 10 Encounter Discharge Information Items Discharge Condition: Stable Ambulatory Status: Wheelchair Discharge Destination: Home Transportation: Other Accompanied By: driver Schedule Follow-up Appointment: No Clinical Summary of Care: Electronic Signature(s) Signed: 09/20/2022 1:56:44 PM By: [name] [name] CHT EMT BS , , Entered By: [name] [name] on 09/20/2022 13:56:44 [name] [name] ([id number]) [id number] Page 2 of 2 -------------------------------------------------------------------------------- Vitals Details Patient Name: Date of Service: [name] [name], [name] [name] 09/20/2022 10:00 A M Medical Record Number: [id number] Patient Account Number: [id number] Date of Birth/Sex: Treating RN: [birth year] (57 y.o. Fredderick [name] Primary Care [name] [name]: [name] [name] Other Clinician: Haywood [name] Referring [name] [name]: Treating [name] [name]/Extender: [name] [name], [name] [name]: 10 Vital Signs Time Taken: 09:43 Temperature (F): 97.7 Height (in): 65 Pulse (bpm): 72 Weight (lbs): 105 Respiratory Rate (breaths/min): 18 Body Mass Index (BMI): 17.5 Blood Pressure (mmHg): 118/67 Capillary Blood Glucose (mg/dl): 841 Reference Range: 80 - 120 mg / dl Electronic Signature(s) Signed: 09/20/2022 12:10:54 PM By: [name] [name] CHT EMT BS , , Entered By: [name] [name] on 09/20/2022 12:10:54

## 2022-09-20 NOTE — Progress Notes (Signed)
TRUDY, KORY ([id number]) [id number] Page 1 of 2 Visit Report for 09/16/2022 Problem List Details Patient Name: Date of Service: [name] [name], [name] [name] 09/16/2022 8:00 A M Medical Record Number: [id number] Patient Account Number: [id number] Date of Birth/Sex: Treating RN: 02/19/1966 (57 y.o. 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 Electronic Signature(s) Signed: 09/19/2022 12:43:30 PM By: Karl Bales EMT Signed: 09/20/2022 4:26:53 PM By: [name] DO Entered By: Karl Bales on 09/19/2022 12:43:30 -------------------------------------------------------------------------------- SuperBill Details Patient Name: Date of Service: [name] [name], [name] [name] 09/16/2022 Medical Record Number: [id number] Patient Account Number: [id number] Date of Birth/Sex: Treating RN: 15-Oct-1965 (57 y.o. 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.671 Other chronic osteomyelitis, right ankle and foot JAMINE, WINGATE ([id number]) [phone].pdf Page 2 of 2 E10.621 Type 1 diabetes mellitus with foot ulcer Facility  Procedures : CPT4 Code Description: [id number] G0277-(Facility Use Only) HBOT full body chamber, , ICD-10 Diagnosis Description E10.621 Type 1 diabetes mellitus with foot ulcer L97.514 Non-pressure chronic ulcer of other part of right foo M86.671 Other chronic  osteomyelitis, right ankle and foot Modifier: t with necrosis o Quantity: 4 f bone Physician Procedures : CPT4 Code Description Modifier [id number] 99183 - WC PHYS HYPERBARIC OXYGEN THERAPY ICD-10 Diagnosis Description E10.621 Type 1 diabetes mellitus with foot ulcer 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 Electronic Signature(s) Signed: 09/19/2022 12:43:19 PM By: Karl Bales EMT Signed: 09/20/2022 4:26:53 PM By: [name] DO Entered By: Karl Bales on 09/19/2022 12:43:19

## 2022-09-20 NOTE — Progress Notes (Signed)
CAYLYNN, MINCHEW ([id number]) [id number] Page 1 of 1 Visit Report for 09/20/2022 SuperBill Details Patient Name: Date of Service: ADELIA, BAPTISTA 09/20/2022 Medical Record Number: [id number] Patient Account Number: [id number] Date of Birth/Sex: Treating RN: 1965/12/19 (57 y.o. 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 Other chronic osteomyelitis, right ankle and foot E10.621 Type 1 diabetes mellitus with foot ulcer Facility Procedures CPT4 Code Description Modifier Quantity [id number] G0277-(Facility Use Only) HBOT full body chamber, , 4 ICD-10 Diagnosis Description E10.621 Type 1 diabetes mellitus with foot ulcer 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

## 2022-09-20 NOTE — Progress Notes (Addendum)
ELSI, STELZER ([id number]) [id number] Page 1 of 2 Visit Report for 09/20/2022 HBO Details Patient Name: Date of Service: [name] [name], [name] [name] 09/20/2022 10:00 A M Medical Record Number: [id number] Patient Account Number: [id number] Date of Birth/Sex: Treating RN: 1965/10/27 (57 y.o. [name] [name] Primary Care [name] [name]: [name] [name] Other Clinician: Haywood [name] Referring [name] [name]: Treating [name] [name]/Extender: [name] [name], [name]: 10 HBO Treatment Course Details Treatment Course Number: 2 Ordering Analiese Krupka: [name] [name] T Treatments Ordered: otal 60 HBO Treatment Start Date: 07/18/2022 HBO Indication: Diabetic Ulcer(s) of the Lower Extremity Standard/Conservative Wound Care tried and failed greater than or equal to 30 days HBO Treatment Details Treatment Number: 43 Patient Type: Outpatient Chamber Type: Monoplace Chamber Serial #: [id number] Treatment Protocol: 2.0 ATA with 90 minutes oxygen, with two 5 minute air breaks Treatment Details Compression Rate Down: 1.5 psi / minute De-Compression Rate Up: 2.0 psi / minute A breaks and breathing ir Compress Tx Pressure periods Decompress Decompress Begins Reached (leave unused spaces Begins Ends blank) Chamber Pressure (ATA 1 2 2 2 2 2  --2 1 ) Clock Time (24 hr) 10:38 10:50 11:20 11:25 11:55 12:00 - - 12:22 12:38 Treatment Length: 120 (minutes) Treatment Segments: 4 Vital Signs Capillary Blood Glucose Reference Range: 80 - 120 mg / dl HBO Diabetic Blood Glucose Intervention Range: <131 mg/dl or >440 mg/dl Type: Time Vitals Blood Pulse: Respiratory Capillary Blood Glucose Pulse Action Temperature: Taken: Pressure: Rate: Glucose (mg/dl): Meter #: Oximetry (%) Taken: Pre 09:43 118/67 72 18 97.7 319 informed physician of CBG Post 12:41 119/92 71 18 97.2 110 none per protocol Treatment Response Treatment Toleration: Well Treatment Completion Status:  Treatment Completed without Adverse Event Treatment Notes [name] [name] arrived with normal vital signs except for blood glucose level. Patient stated that she ate peanut butter toast and banana this morning and self- administered 5 units of Humalog. She prepared for treatment. Dr. Mikey [name] was informed of blood glucose level and that patient denied symptoms of hyperglycemia. Patient was cleared for treatment. Based on past experience with the patient 8 oz of Apple Juice was sent in with the patient for safety. After performing a safety check, patient was placed in the chamber which was compressed with 100% oxygen at a rate of 2 psi/min after confirming normal ear equalization. She tolerated the treatment and subsequent decompression of the chamber at 2 psi/min. She denied any issues with ear equalization and/or barotrauma. Her post-treatment vitals were within normal range. She was transferred to wound care clinic for wound appointment. Additional Procedure Documentation Tissue Sevierity: Necrosis of bone Physician HBO Attestation: I certify that I supervised this HBO treatment in accordance with Medicare guidelines. A trained emergency response team is readily available per Yes hospital policies and procedures. Continue HBOT as ordered. Yes Electronic Signature(s) Signed: 09/20/2022 4:26:53 PM By: [name] [name], Signed: 09/20/2022 4:26:53 PM By: [name] [name] ([id number]) [id number] Page 2 of 2 Previous Signature: 09/20/2022 1:17:16 PM Version By: [name] [name] CHT EMT BS , , Previous Signature: 09/20/2022 12:34:48 PM Version By: [name] [name] CHT EMT BS , , Previous Signature: 09/20/2022 12:20:00 PM Version By: [name] [name] CHT EMT BS , , Entered By: [name] [name] on 09/20/2022 16:23:09 -------------------------------------------------------------------------------- HBO Safety Checklist Details Patient Name: Date of  Service: [name] Milo [name]. 09/20/2022 10:00 A M Medical Record Number: [id number] Patient Account Number: [id number] Date of Birth/Sex: Treating RN: 08/29/1965 (57 y.o. [name] [name] Primary Care Jerrit Horen: [name] [name]  Other Clinician: Haywood [name] Referring [name] [name]: Treating [name] [name]/Extender: [name] [name], [name]: 10 HBO Safety Checklist Items Safety Checklist Consent Form Signed Patient voided / foley secured and emptied When did you last eato 0700 - Peanut butter toast, banana, coffee Last dose of injectable or oral agent 0730 - 5 Units humalog Ostomy pouch emptied and vented if applicable NA All implantable devices assessed, documented and approved Libre 3 on Approved Items List Intravenous access site secured and place NA Valuables secured NA Linens and cotton and cotton/polyester blend (less than 51% polyester) Personal oil-based products / skin lotions / body lotions removed Wigs or hairpieces removed NA Smoking or tobacco materials removed NA Books / newspapers / magazines / loose paper removed Cologne, aftershave, perfume and deodorant removed Jewelry removed (may wrap wedding band) Make-up removed Hair care products removed Libre 3 sensor Approved, otherwise all Battery operated devices (external) removed electronics removed. Heating patches and chemical warmers removed Titanium eyewear removed Nail polish cured greater than 10 hours greater than 10 hours Casting material cured greater than 10 hours NA Hearing aids removed NA Loose dentures or partials removed NA Prosthetics have been removed NA Patient demonstrates correct use of air break device (if applicable) Patient concerns have been addressed Patient grounding bracelet on and cord attached to chamber Specifics for Inpatients (complete in addition to above) Medication sheet sent with patient NA Intravenous medications needed or due during therapy  sent with patient NA Drainage tubes (e.g. nasogastric tube or chest tube secured and vented) NA Endotracheal or Tracheotomy tube secured NA Cuff deflated of air and inflated with saline NA Airway suctioned NA Notes Paper version used prior to treatment. Electronic Signature(s) Signed: 09/20/2022 12:14:34 PM By: [name] [name] CHT EMT BS , , Entered By: [name] [name] on 09/20/2022 12:14:33

## 2022-09-20 NOTE — Progress Notes (Signed)
[name], [name] ([id number]) [id number] Page 1 of 8 Visit Report for 09/20/2022 Arrival Information Details Patient Name: Date of Service: [name] [name], [name] [name] 09/20/2022 12:30 PM Medical Record Number: [id number] Patient Account Number: [id number] Date of Birth/Sex: Treating RN: [birth year] (57 y.o. [name] Primary Care Tavon Magnussen: [name] Other Clinician: Referring Ollie Delano: Treating Karnell Vanderloop/Extender: [name], [name]: 10 Visit Information History Since Last Visit All ordered tests and consults were completed: Yes Patient Arrived: Wheel Chair Added or deleted any medications: No Arrival Time: 12:47 Any new allergies or adverse reactions: No Accompanied By: self Had a fall or experienced change in No Transfer Assistance: None activities of daily living that may affect Patient Identification Verified: Yes risk of falls: Secondary Verification Process Completed: Yes Signs or symptoms of abuse/neglect since last visito No Patient Requires Transmission-Based Precautions: No Hospitalized since last visit: No Patient Has Alerts: No Implantable device outside of the clinic excluding No cellular tissue based products placed in the center since last visit: Has Dressing in Place as Prescribed: Yes Pain Present Now: No Electronic Signature(s) Signed: 09/21/2022 11:11:00 AM By: Brenton Grills Entered By: Brenton Grills on 09/20/2022 12:48:12 -------------------------------------------------------------------------------- Encounter Discharge Information Details Patient Name: Date of Service: [name] Milo D. 09/20/2022 12:30 PM Medical Record Number: [id number] Patient Account Number: [id number] Date of Birth/Sex: Treating RN: 08-23-1965 (57 y.o. [name] Primary Care Arlina Sabina: [name] Other Clinician: Referring Gertrude Bucks: Treating Elzy Tomasello/Extender: [name], YO GESH Weeks in  Treatment: 10 Encounter Discharge Information Items Post Procedure Vitals Discharge Condition: Stable Temperature (F): 98.2 Ambulatory Status: Wheelchair Pulse (bpm): 58 Discharge Destination: Home Respiratory Rate (breaths/min): 18 Transportation: Private Auto Blood Pressure (mmHg): 102/62 Accompanied By: self Schedule Follow-up Appointment: Yes Clinical Summary of Care: Patient Declined Electronic Signature(s) Signed: 09/21/2022 11:11:00 AM By: Brenton Grills Entered By: Brenton Grills on 09/20/2022 13:35:29 [name] ([id number]) [id number] Page 2 of 8 -------------------------------------------------------------------------------- Lower Extremity Assessment Details Patient Name: Date of Service: [name], [name] 09/20/2022 12:30 PM Medical Record Number: [id number] Patient Account Number: [id number] Date of Birth/Sex: Treating RN: 1965/08/20 (57 y.o. [name] Primary Care [place] Ice: [name] Other Clinician: Referring Darrold Bezek: Treating Misty Foutz/Extender: [name], [name]: 10 Edema Assessment Assessed: [Left: No] [Right: No] Edema: [Left: N] [Right: o] Calf Left: Right: Point of Measurement: From Medial Instep 26 cm Ankle Left: Right: Point of Measurement: From Medial Instep 20.5 cm Vascular Assessment Pulses: Dorsalis Pedis Palpable: [Right:Yes] Extremity colors, hair growth, and conditions: Extremity Color: [Right:Normal] Hair Growth on Extremity: [Right:No] Temperature of Extremity: [Right:Warm] Capillary Refill: [Right:< 3 seconds] Dependent Rubor: [Right:No No] Toe Nail Assessment Left: Right: Thick: No Discolored: No Deformed: No Improper Length and Hygiene: No Electronic Signature(s) Signed: 09/21/2022 11:11:00 AM By: Brenton Grills Entered By: Brenton Grills on 09/20/2022 12:49:36 -------------------------------------------------------------------------------- Multi  Wound Chart Details Patient Name: Date of Service: [name] Milo D. 09/20/2022 12:30 PM Medical Record Number: [id number] Patient Account Number: [id number] Date of Birth/Sex: Treating RN: Aug 16, 1965 (57 y.o. F) Primary Care Caral Whan: [name] Other Clinician: Referring Micaela Stith: Treating Coreyon Nicotra/Extender: [name], [name]: 10 Vital Signs Height(in): 65 Capillary Blood Glucose(mg/dl): 937 Weight(lbs): 169 Pulse(bpm): 71 Body Mass Index(BMI): 17.5 Blood Pressure(mmHg): 119/92 [name], [name] ([id number]) [id number].pdf Page 3 of 8 Temperature(F): 97.2 Respiratory Rate(breaths/min): 18 [4:Photos:] [N/A:N/A] Right Calcaneus N/A N/A Wound Location: Gradually Appeared N/A N/A Wounding Event: Diabetic Wound/Ulcer of  the Lower N/A N/A Primary Etiology: Extremity Hypertension, Type I Diabetes, N/A N/A Comorbid History: Osteoarthritis, Osteomyelitis, Neuropathy 05/30/2022 N/A N/A Date Acquired: 10 N/A N/A Weeks of Treatment: Open N/A N/A Wound Status: No N/A N/A Wound Recurrence: 0.6x0.5x0.1 N/A N/A Measurements L x W x D (cm) 0.236 N/A N/A A (cm) : rea 0.024 N/A N/A Volume (cm) : 97.30% N/A N/A % Reduction in A rea: 99.10% N/A N/A % Reduction in Volume: Grade 2 N/A N/A Classification: Medium N/A N/A Exudate A mount: Serosanguineous N/A N/A Exudate Type: red, brown N/A N/A Exudate Color: Distinct, outline attached N/A N/A Wound Margin: Large (67-100%) N/A N/A Granulation A mount: Red N/A N/A Granulation Quality: None Present (0%) N/A N/A Necrotic A mount: Fat Layer (Subcutaneous Tissue): Yes N/A N/A Exposed Structures: Fascia: No Tendon: No Muscle: No Joint: No Bone: No Medium (34-66%) N/A N/A Epithelialization: No Abnormalities Noted N/A N/A Periwound Skin Texture: Maceration: Yes N/A N/A Periwound Skin Moisture: No Abnormalities Noted N/A N/A Periwound Skin Color: Hot N/A  N/A Temperature: Cellular or Tissue Based Product N/A N/A Procedures Performed: Treatment Notes Wound #4 (Calcaneus) Wound Laterality: Right Cleanser Peri-Wound Care Topical Primary Dressing Secondary Dressing ADAPTIC TOUCH 3x4.25 in Discharge Instruction: Apply over primary dressing as directed. Zetuvit Plus Silicone Border Dressing 5x5 (in/in) Discharge Instruction: Apply silicone border over primary dressing as directed. Secured With Compression Wrap Compression Stockings Facilities manager) Signed: 09/20/2022 4:26:53 PM By: Audley Hose, Signed: 09/20/2022 4:26:53 PM By: Sinda Du D ([id number]) [id number] Page 4 of 8 Entered By: [name] on 09/20/2022 13:50:49 -------------------------------------------------------------------------------- Multi-Disciplinary Care Plan Details Patient Name: Date of Service: ADDELYN, ALLEMAN 09/20/2022 12:30 PM Medical Record Number: [id number] Patient Account Number: [id number] Date of Birth/Sex: Treating RN: 31-Dec-1965 (57 y.o. [name] Primary Care Khylon Davies: [name] Other Clinician: Referring Angelys Yetman: Treating Willmar Stockinger/Extender: [name], [name]: 10 Active Inactive HBO Nursing Diagnoses: Anxiety related to feelings of confinement associated with the hyperbaric oxygen chamber Anxiety related to knowledge deficit of hyperbaric oxygen therapy and treatment procedures Potential for barotraumas to ears, sinuses, teeth, and lungs or cerebral gas embolism related to changes in atmospheric pressure inside hyperbaric oxygen chamber Potential for oxygen toxicity seizures related to delivery of 100% oxygen at an increased atmospheric pressure Potential for pulmonary oxygen toxicity related to delivery of 100% oxygen at an increased atmospheric pressure Goals: Patient and/or family will be able to state/discuss  factors appropriate to the management of their disease process during treatment Date Initiated: 07/12/2022 Target Resolution Date: 09/30/2022 Goal Status: Active Patient will tolerate the hyperbaric oxygen therapy treatment Date Initiated: 07/12/2022 Target Resolution Date: 09/23/2022 Goal Status: Active Patient/caregiver will verbalize understanding of HBO goals, rationale, procedures and potential hazards Date Initiated: 07/12/2022 Date Inactivated: 08/12/2022 Target Resolution Date: 08/09/2022 Goal Status: Met Interventions: Administer a five (5) minute air break for patient if signs and symptoms of seizure appear and notify the hyperbaric physician Administer a ten (10) minute air break for patient if signs and symptoms of seizure appear and notify the hyperbaric physician Administer decongestants, per physician orders, prior to HBO2 Administer the correct therapeutic gas delivery based on the patients needs and limitations, per physician order Assess and provide for patients comfort related to the hyperbaric environment and equalization of middle ear Assess for signs and symptoms related to adverse events, including but not limited to confinement anxiety, pneumothorax, oxygen toxicity and baurotrauma Assess patient for any history of confinement  anxiety Assess patient's knowledge and expectations regarding hyperbaric medicine and provide education related to the hyperbaric environment, goals of treatment and prevention of adverse events Implement protocols to decrease risk of pneumothorax in high risk patients Notes: Nutrition Nursing Diagnoses: Imbalanced nutrition Impaired glucose control: actual or potential Goals: Patient/caregiver verbalizes understanding of need to maintain therapeutic glucose control per primary care physician Date Initiated: 07/12/2022 Target Resolution Date: 08/22/2022 Goal Status: Active Patient/caregiver will maintain therapeutic glucose control Date  Initiated: 07/12/2022 Target Resolution Date: 09/23/2022 Goal Status: Active Interventions: Assess HgA1c results as ordered upon admission and as needed Assess patient nutrition upon admission and as needed per policy YARIANNA, VARBLE ([id number]) [phone].pdf Page 5 of 8 Provide education on elevated blood sugars and impact on wound healing Provide education on nutrition Treatment Activities: Education provided on Nutrition : 07/12/2022 Notes: Osteomyelitis Nursing Diagnoses: Infection: osteomyelitis Knowledge deficit related to disease process and management Goals: Patient/caregiver will verbalize understanding of disease process and disease management Date Initiated: 07/12/2022 Date Inactivated: 08/12/2022 Target Resolution Date: 08/18/2022 Goal Status: Met Patient's osteomyelitis will resolve Date Initiated: 07/12/2022 Target Resolution Date: 10/28/2022 Goal Status: Active Signs and symptoms for osteomyelitis will be recognized and promptly addressed Date Initiated: 07/12/2022 Target Resolution Date: 09/30/2022 Goal Status: Active Interventions: Assess for signs and symptoms of osteomyelitis resolution every visit Provide education on osteomyelitis Notes: Electronic Signature(s) Signed: 09/21/2022 11:11:00 AM By: Brenton Grills Entered By: Brenton Grills on 09/20/2022 12:57:41 -------------------------------------------------------------------------------- Pain Assessment Details Patient Name: Date of Service: ZIAN, MOHAMED 09/20/2022 12:30 PM Medical Record Number: [id number] Patient Account Number: [id number] Date of Birth/Sex: Treating RN: Oct 11, 1965 (57 y.o. [name] Primary Care Jaskiran Pata: [name] Other Clinician: Referring Demonica Farrey: Treating Lovey Crupi/Extender: [name], [name]: 10 Active Problems Location of Pain Severity and Description of Pain Patient Has Paino No Site Locations Pain  Management and Medication KAYSE, PUCCINI ([id number]) [phone].pdf Page 6 of 8 Current Pain Management: Electronic Signature(s) Signed: 09/21/2022 11:11:00 AM By: Brenton Grills Entered By: Brenton Grills on 09/20/2022 12:48:51 -------------------------------------------------------------------------------- Patient/Caregiver Education Details Patient Name: Date of Service: [name] 7/23/2024andnbsp12:30 PM Medical Record Number: [id number] Patient Account Number: [id number] Date of Birth/Gender: Treating RN: 1966/01/05 (57 y.o. [name] Primary Care Physician: [name] Other Clinician: Referring Physician: Treating Physician/Extender: Caleen Essex GESH Weeks in Treatment: 10 Education Assessment Education Provided To: Patient Education Topics Provided Wound/Skin Impairment: Methods: Explain/Verbal Responses: State content correctly Electronic Signature(s) Signed: 09/21/2022 11:11:00 AM By: Brenton Grills Entered By: Brenton Grills on 09/20/2022 12:57:58 -------------------------------------------------------------------------------- Wound Assessment Details Patient Name: Date of Service: YASLYN, CUMBY 09/20/2022 12:30 PM Medical Record Number: [id number] Patient Account Number: [id number] Date of Birth/Sex: Treating RN: Jul 14, 1965 (57 y.o. [name] Primary Care Josselin Gaulin: [name] Other Clinician: Referring Shannan Slinker: Treating Zenas Santa/Extender: [name], [name]: 10 Wound Status Wound Number: 4 Primary Diabetic Wound/Ulcer of the Lower Extremity Etiology: Wound Location: Right Calcaneus Wound Status: Open Wounding Event: Gradually Appeared Comorbid Hypertension, Type I Diabetes, Osteoarthritis, Osteomyelitis, Date Acquired: 05/30/2022 History: Neuropathy Weeks Of Treatment: 10 Clustered Wound: No Photos JALACIA, MATTILA ([id number])  [id number] Page 7 of 8 Wound Measurements Length: (cm) 0.6 Width: (cm) 0.5 Depth: (cm) 0.1 Area: (cm) 0.236 Volume: (cm) 0.024 % Reduction in Area: 97.3% % Reduction in Volume: 99.1% Epithelialization: Medium (34-66%) Tunneling: No Undermining: No Wound Description Classification: Grade 2 Wound Margin: Distinct, outline attached Exudate Amount:  Medium Exudate Type: Serosanguineous Exudate Color: red, brown Foul Odor After Cleansing: No Slough/Fibrino No Wound Bed Granulation Amount: Large (67-100%) Exposed Structure Granulation Quality: Red Fascia Exposed: No Necrotic Amount: None Present (0%) Fat Layer (Subcutaneous Tissue) Exposed: Yes Tendon Exposed: No Muscle Exposed: No Joint Exposed: No Bone Exposed: No Periwound Skin Texture Texture Color No Abnormalities Noted: Yes No Abnormalities Noted: Yes Moisture Temperature / Pain No Abnormalities Noted: No Temperature: Hot Maceration: Yes Treatment Notes Wound #4 (Calcaneus) Wound Laterality: Right Cleanser Peri-Wound Care Topical Primary Dressing Secondary Dressing ADAPTIC TOUCH 3x4.25 in Discharge Instruction: Apply over primary dressing as directed. Zetuvit Plus Silicone Border Dressing 5x5 (in/in) Discharge Instruction: Apply silicone border over primary dressing as directed. Secured With Compression Wrap Compression Stockings Add-Ons Electronic Signature(s) Signed: 09/21/2022 11:11:00 AM By: Brenton Grills Entered By: Brenton Grills on 09/20/2022 12:55:51 [name] ([id number]) [id number] Page 8 of 8 -------------------------------------------------------------------------------- Vitals Details Patient Name: Date of Service: SHALOM, WARE 09/20/2022 12:30 PM Medical Record Number: [id number] Patient Account Number: [id number] Date of Birth/Sex: Treating RN: 1965-08-11 (57 y.o. F) Primary Care Heavan Francom: [name] Other  Clinician: Referring Makhya Arave: Treating Ting Cage/Extender: [name], [name]: 10 Vital Signs Time Taken: 12:41 Temperature (F): 97.2 Height (in): 65 Pulse (bpm): 71 Weight (lbs): 105 Respiratory Rate (breaths/min): 18 Body Mass Index (BMI): 17.5 Blood Pressure (mmHg): 119/92 Capillary Blood Glucose (mg/dl): 782 Reference Range: 80 - 120 mg / dl Electronic Signature(s) Signed: 09/21/2022 11:11:00 AM By: Brenton Grills Previous Signature: 09/20/2022 12:46:58 PM Version By: Haywood Pao CHT EMT BS , , Entered By: Brenton Grills on 09/20/2022 12:48:41

## 2022-09-21 ENCOUNTER — Encounter (HOSPITAL_BASED_OUTPATIENT_CLINIC_OR_DEPARTMENT_OTHER): Payer: BC Managed Care – PPO | Admitting: Internal Medicine

## 2022-09-21 DIAGNOSIS — E10621 Type 1 diabetes mellitus with foot ulcer: Secondary | ICD-10-CM | POA: Diagnosis not present

## 2022-09-21 LAB — GLUCOSE, CAPILLARY
Glucose-Capillary: 138 mg/dL — ABNORMAL HIGH (ref 70–99)
Glucose-Capillary: 202 mg/dL — ABNORMAL HIGH (ref 70–99)
Glucose-Capillary: 312 mg/dL — ABNORMAL HIGH (ref 70–99)

## 2022-09-21 NOTE — Progress Notes (Addendum)
[name], [name] ([id number]) [id number] Page 1 of 3 Visit Report for 09/21/2022 HBO Details Patient Name: Date of Service: [name] [name], [name] [name] 09/21/2022 10:00 A M Medical Record Number: [id number] Patient Account Number: [id number] Date of Birth/Sex: Treating RN: 03-14-1965 (57 y.o. [name] [name], [name].[name] Primary Care Breyana Follansbee: [name] Other Clinician: Karl Bales Referring Jed Kutch: Treating Rose Hippler/Extender: [name] GESH Weeks in Treatment: 10 HBO Treatment Course Details Treatment Course Number: 2 Ordering Donae Kueker: [name] T Treatments Ordered: otal 60 HBO Treatment Start Date: 07/18/2022 HBO Indication: Diabetic Ulcer(s) of the Lower Extremity Standard/Conservative Wound Care tried and failed greater than or equal to 30 days HBO Treatment Details Treatment Number: 44 Patient Type: Outpatient Chamber Type: Monoplace Chamber Serial #: [id number] Treatment Protocol: 2.0 ATA with 90 minutes oxygen, with two 5 minute air breaks Treatment Details Compression Rate Down: 2.0 psi / minute De-Compression Rate Up: 2.0 psi / minute A breaks and breathing ir Compress Tx Pressure periods Decompress Decompress Begins Reached (leave unused spaces Begins Ends blank) Chamber Pressure (ATA 1 2 2 2 2 2  --2 1 ) Clock Time (24 hr) 10:36 10:45 11:15 11:20 11:50 11:55 - - 12:25 12:32 Treatment Length: 116 (minutes) Treatment Segments: 4 Vital Signs Capillary Blood Glucose Reference Range: 80 - 120 mg / dl HBO Diabetic Blood Glucose Intervention Range: <131 mg/dl or >244 mg/dl Time Vitals Blood Respiratory Capillary Blood Glucose Pulse Action Type: Pulse: Temperature: Taken: Pressure: Rate: Glucose (mg/dl): Meter #: Oximetry (%) Taken: Pre 09:28 124/74 72 18 97.5 202 Post 12:37 137/76 73 18 98.2 312 Pre 10:28 138 Treatment Response Treatment Toleration: Well Treatment Completion Status: Treatment Completed without Adverse  Event Treatment Notes [name] arrived with vital signs within normal range. She stated that she ate peanut butter toast, fruit, with coffee and self-administered 5 units of Humalog approximately 0700. Her blood glucose level was 202 mg/dL Per previous experience, blood glucose was monitored during preparation for treatment with her [name] [name] 3 showing a downward trend. She was given 8 oz Glucerna and 8 oz orange juice approximately 1000. Her blood glucose was re-measured for safety at 1028 wirh a result of 138 mg/dL. Dr. Leanord Hawking was informed of patient's glucose level and interventions. For safety (per experience) patient was sent in with 8 oz orange juice for safety. After performing a safety check she was placed in the chamber. (MSCAMMELL) Dr. Leanord Hawking informed of post treatment blood sugar. (EscobarGrant) Additional Procedure Documentation Tissue Sevierity: Necrosis of bone Duha Abair Notes The patient tolerated treatment well. She did have a relatively low blood sugar for her. She does have a history of significant decreases in her CBGs after hyperbaric treatment but she appeared to tolerate things well Physician HBO Attestation: I certify that I supervised this HBO treatment in accordance with Medicare guidelines. A trained emergency response team is readily available per Yes hospital policies and procedures. TONIE, ELSEY ([id number]) [id number] Page 2 of 3 Continue HBOT as ordered. Yes Electronic Signature(s) Signed: 09/21/2022 4:12:50 PM By: Baltazar Najjar MD Previous Signature: 09/21/2022 1:30:49 PM Version By: Karl Bales EMT Previous Signature: 09/21/2022 1:19:11 PM Version By: Karl Bales EMT Previous Signature: 09/21/2022 11:11:22 AM Version By: Haywood Pao CHT EMT BS , , Entered By: Baltazar Najjar on 09/21/2022 16:11:15 -------------------------------------------------------------------------------- HBO Safety Checklist Details Patient Name: Date of  Service: [name] Milo D. 09/21/2022 10:00 A M Medical Record Number: [id number] Patient Account Number: [id number] Date of Birth/Sex: Treating RN: November 24, 1965 (57 y.o. [name] [name] Primary Care Schyler Counsell:  Sherilyn Banker GESH Other Clinician: Karl Bales Referring Bronda Alfred: Treating Inayah Woodin/Extender: Deberah Pelton, [name]: 10 HBO Safety Checklist Items Safety Checklist Consent Form Signed Patient voided / foley secured and emptied When did you last eato 0700 - PB Toast, Coffee Fruit Last dose of injectable or oral agent 0700 5 units Humalog Ostomy pouch emptied and vented if applicable NA All implantable devices assessed, documented and approved Libre 3 on Approved Items List Intravenous access site secured and place NA Valuables secured Linens and cotton and cotton/polyester blend (less than 51% polyester) Personal oil-based products / skin lotions / body lotions removed Wigs or hairpieces removed NA human hair extensions approved. Smoking or tobacco materials removed NA Books / newspapers / magazines / loose paper removed Cologne, aftershave, perfume and deodorant removed Jewelry removed (may wrap wedding band) Make-up removed Hair care products removed Human Hair extensions approved Libre 3 sensor Approved, otherwise all Battery operated devices (external) removed electronics removed. Heating patches and chemical warmers removed Titanium eyewear removed Nail polish cured greater than 10 hours greater than 10 hours Casting material cured greater than 10 hours NA Hearing aids removed NA Loose dentures or partials removed NA Prosthetics have been removed NA Patient demonstrates correct use of air break device (if applicable) Patient concerns have been addressed Patient grounding bracelet on and cord attached to chamber Specifics for Inpatients (complete in addition to above) Medication sheet sent with patient NA Intravenous  medications needed or due during therapy sent with patient NA Drainage tubes (e.g. nasogastric tube or chest tube secured and vented) NA Endotracheal or Tracheotomy tube secured NA Cuff deflated of air and inflated with saline NA Airway suctioned NA Notes Paper version used prior to treatment start. Electronic Signature(s) JULIANE, GUEST D ([id number]) [id number] Page 3 of 3 Signed: 09/21/2022 11:05:47 AM By: Haywood Pao CHT EMT BS , , Entered By: Haywood Pao on 09/21/2022 11:05:46

## 2022-09-21 NOTE — Progress Notes (Addendum)
LORAIN, FETTES ([id number]) [id number] Page 1 of 2 Visit Report for 09/21/2022 Arrival Information Details Patient Name: Date of Service: [name] [name] 09/21/2022 10:00 A M Medical Record Number: [id number] Patient Account Number: [id number] Date of Birth/Sex: Treating RN: [name] [name], [name] [name] (57 y.o. [name] [name], [name] [name] Primary Care Andra Matsuo: [name] Other Clinician: Haywood Pao Referring Nathalee Smarr: Treating Keyondre Hepburn/Extender: [name] GESH Weeks in Treatment: 10 Visit Information History Since Last Visit All ordered tests and consults were completed: Yes Patient Arrived: Wheel Chair Added or deleted any medications: No Arrival Time: 09:28 Any new allergies or adverse reactions: No Accompanied By: driver Had a fall or experienced change in No Transfer Assistance: None activities of daily living that may affect Patient Identification Verified: Yes risk of falls: Secondary Verification Process Completed: Yes Signs or symptoms of abuse/neglect since last visito No Patient Requires Transmission-Based Precautions: No Hospitalized since last visit: No Patient Has Alerts: No Implantable device outside of the clinic excluding No cellular tissue based products placed in the center since last visit: Pain Present Now: No Electronic Signature(s) Signed: 09/21/2022 11:02:49 AM By: Haywood Pao CHT EMT BS , , Entered By: Haywood Pao on 09/21/2022 11:02:49 -------------------------------------------------------------------------------- Encounter Discharge Information Details Patient Name: Date of Service: [name] Milo D. 09/21/2022 10:00 A M Medical Record Number: [id number] Patient Account Number: [id number] Date of Birth/Sex: Treating RN: [name] [name] (57 y.o. [name] [name], [name] [name] Primary Care Loren Vicens: [name] Other Clinician: Karl Bales Referring Anelle Parlow: Treating Heman Que/Extender: [name]  GESH Weeks in Treatment: 10 Encounter Discharge Information Items Discharge Condition: Stable Ambulatory Status: Wheelchair Discharge Destination: Home Transportation: Private Auto Accompanied By: Clearence Cheek Schedule Follow-up Appointment: Yes Clinical Summary of Care: Electronic Signature(s) Signed: 09/21/2022 1:32:[name] PM By: Karl Bales EMT Entered By: Karl Bales on 09/21/2022 13:32:[name] [name] ([id number]) [id number] Page 2 of 2 -------------------------------------------------------------------------------- Vitals Details Patient Name: Date of Service: [name] [name] 09/21/2022 10:00 A M Medical Record Number: [id number] Patient Account Number: [id number] Date of Birth/Sex: Treating RN: [birth year] (57 y.o. [name] [name], [name] [name] Primary Care Viveka Wilmeth: [name] Other Clinician: Haywood Pao Referring Regla Fitzgibbon: Treating Merrick Maggio/Extender: Deberah Pelton, [name]: 10 Vital Signs Time Taken: 09:28 Temperature (F): 97.5 Height (in): 65 Pulse (bpm): 72 Weight (lbs): 105 Respiratory Rate (breaths/min): 18 Body Mass Index (BMI): 17.5 Blood Pressure (mmHg): 124/74 Capillary Blood Glucose (mg/dl): 517 Reference Range: 80 - 120 mg / dl Electronic Signature(s) Signed: 09/21/2022 11:03:20 AM By: Haywood Pao CHT EMT BS , , Entered By: Haywood Pao on 09/21/2022 11:03:20

## 2022-09-21 NOTE — Progress Notes (Signed)
CAMBER, NINH ([id number]) [id number] Page 1 of 2 Visit Report for 07/27/2022 Arrival Information Details Patient Name: Date of Service: [name] [name], [name] [name] 07/27/2022 8:00 A M Medical Record Number: [id number] Patient Account Number: [id number] Date of Birth/Sex: Treating RN: May 05, 1965 (57 y.o. [name] [name], [name] Primary Care Lutricia Widjaja: [name] Other Clinician: Haywood Pao Referring Evolette Pendell: Treating Dock Baccam/Extender: Alford Highland, [name]: 2 Visit Information History Since Last Visit All ordered tests and consults were completed: Yes Patient Arrived: Wheel Chair Added or deleted any medications: No Arrival Time: 07:30 Any new allergies or adverse reactions: No Accompanied By: spouse Had a fall or experienced change in No Transfer Assistance: None activities of daily living that may affect Patient Identification Verified: Yes risk of falls: Secondary Verification Process Completed: Yes Hospitalized since last visit: No Patient Requires Transmission-Based Precautions: No Implantable device outside of the clinic excluding No Patient Has Alerts: No cellular tissue based products placed in the center since last visit: Has Dressing in Place as Prescribed: Yes Pain Present Now: No Electronic Signature(s) Signed: 09/21/2022 10:48:27 AM By: Pearletha Alfred Previous Signature: 07/27/2022 11:14:27 AM Version By: Haywood Pao CHT EMT BS , , Previous Signature: 07/27/2022 9:51:17 AM Version By: Haywood Pao CHT EMT BS , , Entered By: Pearletha Alfred on 09/21/2022 10:48:27 -------------------------------------------------------------------------------- Encounter Discharge Information Details Patient Name: Date of Service: [name] Milo D. 07/27/2022 8:00 A M Medical Record Number: [id number] Patient Account Number: [id number] Date of Birth/Sex: Treating RN: [birth year] (57 y.o. [name] [name] Primary Care  Yarethzy Croak: [name] Other Clinician: Haywood Pao Referring [name]: Treating Dru Primeau/Extender: Alford Highland, [name]: 2 Encounter Discharge Information Items Discharge Condition: Stable Ambulatory Status: Wheelchair Discharge Destination: Home Transportation: Private Auto Accompanied By: spouse Schedule Follow-up Appointment: No Clinical Summary of Care: Electronic Signature(s) Signed: 07/27/2022 11:14:18 AM By: Haywood Pao CHT EMT BS , , Entered By: Haywood Pao on 07/27/2022 11:14:17 [name] ([id number]) [id number] Page 2 of 2 -------------------------------------------------------------------------------- Vitals Details Patient Name: Date of Service: [name] [name], [name] [name] 07/27/2022 8:00 A M Medical Record Number: [id number] Patient Account Number: [id number] Date of Birth/Sex: Treating RN: 1965-08-28 (57 y.o. [name] [name], [name] Primary Care [name]: [name] Other Clinician: Haywood Pao Referring [name]: Treating Olumide Dolinger/Extender: Alford Highland, [name]: 2 Vital Signs Time Taken: 07:48 Temperature (F): 97.5 Height (in): 65 Pulse (bpm): 70 Weight (lbs): 105 Respiratory Rate (breaths/min): 18 Body Mass Index (BMI): 17.5 Blood Pressure (mmHg): 134/85 Capillary Blood Glucose (mg/dl): 160 Reference Range: 80 - 120 mg / dl Electronic Signature(s) Signed: 09/21/2022 10:48:33 AM By: Pearletha Alfred Previous Signature: 07/27/2022 9:52:28 AM Version By: Haywood Pao CHT EMT BS , , Entered By: Pearletha Alfred on 09/21/2022 10:48:33

## 2022-09-21 NOTE — Progress Notes (Signed)
SHAARON, GOLLIDAY ([id number]) [id number] Page 1 of 2 Visit Report for 09/21/2022 Problem List Details Patient Name: Date of Service: [name] [name], [name] [name] 09/21/2022 10:00 A M Medical Record Number: [id number] Patient Account Number: [id number] Date of Birth/Sex: Treating RN: 10-06-1965 (57 y.o. 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 Other chronic osteomyelitis, right ankle and foot 07/12/2022 No Yes E10.621 Type 1 diabetes mellitus with foot ulcer 07/12/2022 No Yes Inactive Problems Resolved Problems Electronic Signature(s) Signed: 09/21/2022 1:31:20 PM By: Karl Bales EMT Signed: 09/21/2022 4:12:50 PM By: Baltazar Najjar MD Entered By: Karl Bales on 09/21/2022 13:31:20 -------------------------------------------------------------------------------- SuperBill Details Patient Name: Date of Service: [name] [name] 09/21/2022 Medical Record Number: [id number] Patient Account Number: [id number] Date of Birth/Sex: Treating RN: 02/16/1966 (57 y.o. [name], [name] Primary Care Provider: [name] Other Clinician: Karl Bales Referring Provider: Treating Provider/Extender: [name] GESH Weeks in Treatment: 10 Diagnosis Coding ICD-10 Codes Code Description [phone] Non-pressure chronic ulcer of other part of right foot with necrosis of bone M86.671 Other chronic osteomyelitis, right ankle and foot E10.621 Type 1 diabetes mellitus with foot ulcer Facility Procedures [name] [name], [name] [name] ([id number]): CPT4 Code Description Modifier [id number]-(Facility Use Only) HBOT full body chamber, ,  ICD-10 Diagnosis Description E10.621 Type 1 diabetes mellitus with foot ulcer L97.514 Non-pressure chronic ulcer of other part  of right foot with necrosis o M86.671 Other chronic osteomyelitis, right ankle and foot [id number] Page 2 of 2: Quantity 4 f bone Physician Procedures : CPT4 Code Description Modifier [phone] [id number] - WC PHYS HYPERBARIC OXYGEN THERAPY ICD-10 Diagnosis Description E10.621 Type 1 diabetes mellitus with foot ulcer L97.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

## 2022-09-22 ENCOUNTER — Encounter (HOSPITAL_BASED_OUTPATIENT_CLINIC_OR_DEPARTMENT_OTHER): Payer: BC Managed Care – PPO | Admitting: Internal Medicine

## 2022-09-22 DIAGNOSIS — L97514 Non-pressure chronic ulcer of other part of right foot with necrosis of bone: Secondary | ICD-10-CM | POA: Diagnosis not present

## 2022-09-22 DIAGNOSIS — M86671 Other chronic osteomyelitis, right ankle and foot: Secondary | ICD-10-CM

## 2022-09-22 DIAGNOSIS — E10621 Type 1 diabetes mellitus with foot ulcer: Secondary | ICD-10-CM | POA: Diagnosis not present

## 2022-09-22 LAB — GLUCOSE, CAPILLARY
Glucose-Capillary: 155 mg/dL — ABNORMAL HIGH (ref 70–99)
Glucose-Capillary: 243 mg/dL — ABNORMAL HIGH (ref 70–99)
Glucose-Capillary: 266 mg/dL — ABNORMAL HIGH (ref 70–99)

## 2022-09-22 NOTE — Progress Notes (Signed)
ANNET, MANUKYAN ([id number]) [id number] Page 1 of 2 Visit Report for 09/22/2022 Arrival Information Details Patient Name: Date of Service: [name] [name], [name] [name] 09/22/2022 10:00 A M Medical Record Number: [id number] Patient Account Number: [id number] Date of Birth/Sex: Treating RN: 10-04-1965 (57 y.o. [name] Primary Care Amilyah Nack: [name] Other Clinician: Haywood Pao Referring Emmamae Mcnamara: Treating Savannha Welle/Extender: [name], [name]: 10 Visit Information History Since Last Visit All ordered tests and consults were completed: Yes Patient Arrived: Wheel Chair Added or deleted any medications: No Arrival Time: 09:31 Any new allergies or adverse reactions: No Accompanied By: driver Had a fall or experienced change in No Transfer Assistance: None activities of daily living that may affect Patient Identification Verified: Yes risk of falls: Secondary Verification Process Completed: Yes Signs or symptoms of abuse/neglect since last visito No Patient Requires Transmission-Based Precautions: No Hospitalized since last visit: No Patient Has Alerts: No Implantable device outside of the clinic excluding No cellular tissue based products placed in the center since last visit: Pain Present Now: No Electronic Signature(s) Signed: 09/22/2022 2:32:04 PM By: Haywood Pao CHT EMT BS , , Entered By: Haywood Pao on 09/22/2022 14:32:04 -------------------------------------------------------------------------------- Encounter Discharge Information Details Patient Name: Date of Service: [name] Milo D. 09/22/2022 10:00 A M Medical Record Number: [id number] Patient Account Number: [id number] Date of Birth/Sex: Treating RN: 08/26/1965 (57 y.o. [name] Primary Care Jashanti Clinkscale: [name] Other Clinician: Haywood Pao Referring Haidee Stogsdill: Treating Angelos Wasco/Extender: [name], [name]: 10 Encounter Discharge Information Items Discharge Condition: Stable Ambulatory Status: Ambulatory Discharge Destination: Home Transportation: Other Accompanied By: driver Schedule Follow-up Appointment: No Clinical Summary of Care: Electronic Signature(s) Signed: 09/22/2022 2:53:55 PM By: Haywood Pao CHT EMT BS , , Previous Signature: 09/22/2022 2:53:41 PM Version By: Haywood Pao CHT EMT BS , , Entered By: Haywood Pao on 09/22/2022 14:53:54 [name] [name] ([id number]) [id number] Page 2 of 2 -------------------------------------------------------------------------------- Vitals Details Patient Name: Date of Service: [name] [name], [name] [name] 09/22/2022 10:00 A M Medical Record Number: [id number] Patient Account Number: [id number] Date of Birth/Sex: Treating RN: 08-24-1965 (57 y.o. [name] Primary Care Burton Gahan: [name] Other Clinician: Haywood Pao Referring Arlone Lenhardt: Treating Omega Durante/Extender: [name], [name]: 10 Vital Signs Time Taken: 09:37 Temperature (F): 98.1 Height (in): 65 Pulse (bpm): 70 Weight (lbs): 105 Respiratory Rate (breaths/min): 18 Body Mass Index (BMI): 17.5 Blood Pressure (mmHg): 137/83 Capillary Blood Glucose (mg/dl): 644 Reference Range: 80 - 120 mg / dl Electronic Signature(s) Signed: 09/22/2022 2:34:44 PM By: Haywood Pao CHT EMT BS , , Entered By: Haywood Pao on 09/22/2022 14:34:44

## 2022-09-22 NOTE — Progress Notes (Addendum)
[name] [name] ([id number]) [id number] Page 1 of 2 Visit Report for 09/22/2022 HBO Details Patient Name: Date of Service: [name] [name], [name] [name] 09/22/2022 10:00 A M Medical Record Number: [id number] Patient Account Number: [id number] Date of Birth/Sex: Treating RN: Dec 14, 1965 (57 y.o. [name], [name] Primary Care Elizabelle Fite: [name] Other Clinician: Haywood Pao Referring Avant Printy: Treating Nethaniel Mattie/Extender: [name], [name]: 10 HBO Treatment Course Details Treatment Course Number: 2 Ordering Tahjae Clausing: [name] T Treatments Ordered: otal 60 HBO Treatment Start Date: 07/18/2022 HBO Indication: Diabetic Ulcer(s) of the Lower Extremity Standard/Conservative Wound Care tried and failed greater than or equal to 30 days HBO Treatment Details Treatment Number: 45 Patient Type: Outpatient Chamber Type: Monoplace Chamber Serial #: [id number] Treatment Protocol: 2.0 ATA with 90 minutes oxygen, with two 5 minute air breaks Treatment Details Compression Rate Down: 1.5 psi / minute De-Compression Rate Up: 2.0 psi / minute A breaks and breathing ir Compress Tx Pressure periods Decompress Decompress Begins Reached (leave unused spaces Begins Ends blank) Chamber Pressure (ATA 1 2 2 2 2 2  --2 1 ) Clock Time (24 hr) 10:43 10:57 11:27 11:32 12:02 12:07 - - 12:37 12:45 Treatment Length: 122 (minutes) Treatment Segments: 4 Vital Signs Capillary Blood Glucose Reference Range: 80 - 120 mg / dl HBO Diabetic Blood Glucose Intervention Range: <131 mg/dl or >027 mg/dl Type: Time Vitals Blood Pulse: Respiratory Temperature: Capillary Blood Glucose Pulse Action Taken: Pressure: Rate: Glucose (mg/dl): Meter #: Oximetry (%) Taken: Pre 09:37 137/83 70 18 98.1 155 patient voluntarily requested Glucerna Post 12:50 126/86 71 18 97.2 266 asymptomatic for hyperglycemia Treatment Response Treatment Completion Status: Treatment  Completed without Adverse Event Treatment Notes Mrs. Bradeen arrived with vital signs within normal range. She stated that she ate peanut butter toast, fruit, with coffee and self-administered 5 units of Humalog approximately 0700. Her blood glucose level was 155 mg/dL After preparing for treatment, she requested 8 oz Glucerna. Her blood glucose was re-measured for safety at 1035 with a result of 243 mg/dL. After performing a safety check she was placed in the chamber which was compressed with 100% oxygen at a rate of 2 psi/min after confirming normal ear equalization. She tolerated treatment and subsequent decompression of the chamber at the rate set of 2 psi/min. She denied any issues with ear equalization and/or barotrauma pain. She was stable upon discharge with her driver. Additional Procedure Documentation Tissue Sevierity: Necrosis of bone Physician HBO Attestation: I certify that I supervised this HBO treatment in accordance with Medicare guidelines. A trained emergency response team is readily available per Yes hospital policies and procedures. Continue HBOT as ordered. Yes Electronic Signature(s) Signed: 09/22/2022 3:41:47 PM By: [name] DO Previous Signature: 09/22/2022 2:52:46 PM Version By: Haywood Pao CHT EMT BS , , Previous Signature: 09/22/2022 3:19:04 PM Version By: Audley Hose, Vivianne Spence ([id number]) [id number] Page 2 of 2 Entered By: [name] on 09/22/2022 15:39:17 -------------------------------------------------------------------------------- HBO Safety Checklist Details Patient Name: Date of Service: [name] [name] 09/22/2022 10:00 A M Medical Record Number: [id number] Patient Account Number: [id number] Date of Birth/Sex: Treating RN: 1965-09-25 (57 y.o. [name] [name] Standard Primary Care Shanta Dorvil: [name] Other Clinician: Haywood Pao Referring Karolyna Bianchini: Treating Kirby Cortese/Extender: [name], [name]: 10 HBO Safety Checklist Items Safety Checklist Consent Form Signed Patient voided / foley secured and emptied When did you last eato 0700 - Peanut butter toast, fruit coffee Last dose of injectable  or oral agent 0700 - 5 Units Humalog Ostomy pouch emptied and vented if applicable NA All implantable devices assessed, documented and approved NA Intravenous access site secured and place NA Valuables secured Linens and cotton and cotton/polyester blend (less than 51% polyester) Personal oil-based products / skin lotions / body lotions removed Wigs or hairpieces removed NA Smoking or tobacco materials removed NA Books / newspapers / magazines / loose paper removed Cologne, aftershave, perfume and deodorant removed Jewelry removed (may wrap wedding band) Make-up removed Hair care products removed Human Hair extensions approved Battery operated devices (external) removed Heating patches and chemical warmers removed Titanium eyewear removed Nail polish cured greater than 10 hours greater than 10 hours Casting material cured greater than 10 hours NA Hearing aids removed NA Loose dentures or partials removed NA Prosthetics have been removed NA Patient demonstrates correct use of air break device (if applicable) Patient concerns have been addressed Patient grounding bracelet on and cord attached to chamber Specifics for Inpatients (complete in addition to above) Medication sheet sent with patient NA Intravenous medications needed or due during therapy sent with patient NA Drainage tubes (e.g. nasogastric tube or chest tube secured and vented) NA Endotracheal or Tracheotomy tube secured NA Cuff deflated of air and inflated with saline NA Airway suctioned NA Notes Paper version used prior to treatment start. Electronic Signature(s) Signed: 09/22/2022 2:38:31 PM By: Haywood Pao CHT EMT BS , , Entered By: Haywood Pao  on 09/22/2022 14:38:30

## 2022-09-22 NOTE — Progress Notes (Signed)
JINNA, WEINMAN ([id number]) [id number] Page 1 of 1 Visit Report for 09/22/2022 SuperBill Details Patient Name: Date of Service: [name] [name], [name] [name] 09/22/2022 Medical Record Number: [id number] Patient Account Number: [id number] Date of Birth/Sex: Treating RN: 1965-04-07 (57 y.o. 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 Other chronic osteomyelitis, right ankle and foot E10.621 Type 1 diabetes mellitus with foot ulcer Facility Procedures CPT4 Code Description Modifier Quantity [id number] G0277-(Facility Use Only) HBOT full body chamber, , 4 ICD-10 Diagnosis Description E10.621 Type 1 diabetes mellitus with foot ulcer 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

## 2022-09-23 ENCOUNTER — Encounter (HOSPITAL_BASED_OUTPATIENT_CLINIC_OR_DEPARTMENT_OTHER): Payer: BC Managed Care – PPO | Admitting: Internal Medicine

## 2022-09-23 DIAGNOSIS — E10621 Type 1 diabetes mellitus with foot ulcer: Secondary | ICD-10-CM

## 2022-09-23 DIAGNOSIS — M86671 Other chronic osteomyelitis, right ankle and foot: Secondary | ICD-10-CM | POA: Diagnosis not present

## 2022-09-23 DIAGNOSIS — L97514 Non-pressure chronic ulcer of other part of right foot with necrosis of bone: Secondary | ICD-10-CM

## 2022-09-23 LAB — GLUCOSE, CAPILLARY
Glucose-Capillary: 216 mg/dL — ABNORMAL HIGH (ref 70–99)
Glucose-Capillary: 296 mg/dL — ABNORMAL HIGH (ref 70–99)

## 2022-09-23 NOTE — Progress Notes (Addendum)
JULEY, HOPSON ([id number]) [id number] Page 1 of 2 Visit Report for 09/23/2022 HBO Details Patient Name: Date of Service: [name] [name], [name] [name] 09/23/2022 7:30 A M Medical Record Number: [id number] Patient Account Number: [id number] Date of Birth/Sex: Treating RN: June 15, 1965 (57 y.o. [name] [name], [name] Primary Care [name] [name]: [name] [name] Other Clinician: Karl [name] Referring [name] [name]: Treating [name] [name]/Extender: [name] [name], [name]: 10 HBO Treatment Course Details Treatment Course Number: 2 Ordering [name] [name]: [name] [name] T Treatments Ordered: otal 60 HBO Treatment Start Date: 07/18/2022 HBO Indication: Diabetic Ulcer(s) of the Lower Extremity Standard/Conservative Wound Care tried and failed greater than or equal to 30 days HBO Treatment Details Treatment Number: 46 Patient Type: Outpatient Chamber Type: Monoplace Chamber Serial #: [id number] Treatment Protocol: 2.0 ATA with 90 minutes oxygen, with two 5 minute air breaks Treatment Details Compression Rate Down: 2.0 psi / minute De-Compression Rate Up: A breaks and breathing ir Compress Tx Pressure periods Decompress Decompress Begins Reached (leave unused spaces Begins Ends blank) Chamber Pressure (ATA 1 2 2 2 2 2  --2 1 ) Clock Time (24 hr) 07:59 08:06 08:36 08:41 09:11 09:16 - - 09:46 09:53 Treatment Length: 114 (minutes) Treatment Segments: 4 Vital Signs Capillary Blood Glucose Reference Range: 80 - 120 mg / dl HBO Diabetic Blood Glucose Intervention Range: <131 mg/dl or >425 mg/dl Time Vitals Blood Respiratory Capillary Blood Glucose Pulse Action Type: Pulse: Temperature: Taken: Pressure: Rate: Glucose (mg/dl): Meter #: Oximetry (%) Taken: Pre 07:44 127/74 70 18 97.1 216 Post 09:59 144/73 70 20 97.1 296 Treatment Response Treatment Toleration: Well Treatment Completion Status: Treatment Completed without Adverse Event Additional Procedure  Documentation Tissue Sevierity: Necrosis of bone Physician HBO Attestation: I certify that I supervised this HBO treatment in accordance with Medicare guidelines. A trained emergency response team is readily available per Yes hospital policies and procedures. Continue HBOT as ordered. Yes Electronic Signature(s) Signed: 09/26/2022 5:19:21 PM By: [name] Corwin DO Previous Signature: 09/23/2022 10:27:55 AM Version By: [name] [name] EMT Previous Signature: 09/23/2022 12:44:10 PM Version By: [name] Corwin DO Previous Signature: 09/23/2022 9:17:51 AM Version By: [name] [name] EMT Previous Signature: 09/23/2022 8:43:27 AM Version By: [name] [name] EMT Entered By: [name] [name] on 09/26/2022 09:20:20 [name] ([id number]) [id number] Page 2 of 2 -------------------------------------------------------------------------------- HBO Safety Checklist Details Patient Name: Date of Service: [name] [name], [name] [name] 09/23/2022 7:30 A M Medical Record Number: [id number] Patient Account Number: [id number] Date of Birth/Sex: Treating RN: 1965-08-19 (57 y.o. [name] [name], [name] Primary Care Khristina Janota: [name] [name] Other Clinician: Karl [name] Referring [name] [name]: Treating [name] [name]/Extender: [name] [name], [name]: 10 HBO Safety Checklist Items Safety Checklist Consent Form Signed Patient voided / foley secured and emptied When did you last eato 0630 Last dose of injectable or oral agent 0630 6 units Humalog Ostomy pouch emptied and vented if applicable NA All implantable devices assessed, documented and approved NA Intravenous access site secured and place NA Valuables secured Linens and cotton and cotton/polyester blend (less than 51% polyester) Personal oil-based products / skin lotions / body lotions removed Wigs or hairpieces removed Human Extensions approved Smoking or tobacco materials removed Books / newspapers / magazines /  loose paper removed Cologne, aftershave, perfume and deodorant removed Jewelry removed (may wrap wedding band) Make-up removed Hair care products removed Battery operated devices (external) removed Libre3 Heating patches and chemical warmers removed Titanium eyewear removed NA Nail polish cured greater than 10 hours Over 10 hours old Haematologist cured  greater than 10 hours NA Hearing aids removed NA Loose dentures or partials removed NA Prosthetics have been removed NA Patient demonstrates correct use of air break device (if applicable) Patient concerns have been addressed Patient grounding bracelet on and cord attached to chamber Specifics for Inpatients (complete in addition to above) Medication sheet sent with patient NA Intravenous medications needed or due during therapy sent with patient NA Drainage tubes (e.g. nasogastric tube or chest tube secured and vented) NA Endotracheal or Tracheotomy tube secured NA Cuff deflated of air and inflated with saline NA Airway suctioned NA Notes The safety checklist was done before the treatment was stated. Electronic Signature(s) Signed: 09/23/2022 8:42:40 AM By: [name] [name] EMT Entered By: [name] [name] on 09/23/2022 08:42:40

## 2022-09-23 NOTE — Progress Notes (Addendum)
DEONDRA, SWANER ([id number]) [id number] Page 1 of 2 Visit Report for 09/23/2022 Arrival Information Details Patient Name: Date of Service: EUNIQUA, KALISTA 09/23/2022 7:30 A M Medical Record Number: [id number] Patient Account Number: [id number] Date of Birth/Sex: Treating RN: 02-Jan-1966 (57 y.o. [name], [name] Primary Care Jaedyn Lard: [name] Other Clinician: Karl Bales Referring Darron Stuck: Treating Breaunna Gottlieb/Extender: [name], [name]: 10 Visit Information History Since Last Visit All ordered tests and consults were completed: Yes Patient Arrived: Wheel Chair Added or deleted any medications: No Arrival Time: 07:33 Any new allergies or adverse reactions: No Accompanied By: Husband Had a fall or experienced change in No Transfer Assistance: None activities of daily living that may affect Patient Identification Verified: Yes risk of falls: Secondary Verification Process Completed: Yes Signs or symptoms of abuse/neglect since last visito No Patient Requires Transmission-Based Precautions: No Hospitalized since last visit: No Patient Has Alerts: No Implantable device outside of the clinic excluding No cellular tissue based products placed in the center since last visit: Pain Present Now: No Electronic Signature(s) Signed: 09/23/2022 8:37:12 AM By: Karl Bales EMT Entered By: Karl Bales on 09/23/2022 08:37:12 -------------------------------------------------------------------------------- Encounter Discharge Information Details Patient Name: Date of Service: [name] 09/23/2022 7:30 A M Medical Record Number: [id number] Patient Account Number: [id number] Date of Birth/Sex: Treating RN: 02-26-1966 (57 y.o. [name], [name] Primary Care Susana Duell: [name] Other Clinician: Karl Bales Referring Aneya Daddona: Treating Aurora Rody/Extender: [name], YO GESH Weeks in  Treatment: 10 Encounter Discharge Information Items Discharge Condition: Stable Ambulatory Status: Wheelchair Discharge Destination: Home Transportation: Private Auto Accompanied By: Husband Schedule Follow-up Appointment: Yes Clinical Summary of Care: Electronic Signature(s) Signed: 09/23/2022 10:29:26 AM By: Karl Bales EMT Entered By: Karl Bales on 09/23/2022 10:29:26 [name] ([id number]) [id number] Page 2 of 2 -------------------------------------------------------------------------------- Vitals Details Patient Name: Date of Service: LINCOLN, SHEW 09/23/2022 7:30 A M Medical Record Number: [id number] Patient Account Number: [id number] Date of Birth/Sex: Treating RN: [birth year] (57 y.o. [name], [name] Primary Care Jacyln Carmer: [name] Other Clinician: Karl Bales Referring Corisa Montini: Treating Conlin Brahm/Extender: [name], [name]: 10 Vital Signs Time Taken: 07:44 Temperature (F): 97.1 Height (in): 65 Pulse (bpm): 70 Weight (lbs): 105 Respiratory Rate (breaths/min): 18 Body Mass Index (BMI): 17.5 Blood Pressure (mmHg): 127/74 Capillary Blood Glucose (mg/dl): 643 Reference Range: 80 - 120 mg / dl Electronic Signature(s) Signed: 09/23/2022 8:38:01 AM By: Karl Bales EMT Entered By: Karl Bales on 09/23/2022 08:38:01

## 2022-09-23 NOTE — Progress Notes (Signed)
AUDRYNA, PROPSON ([id number]) [id number] Page 1 of 2 Visit Report for 09/23/2022 Problem List Details Patient Name: Date of Service: ANNISSA, REIS 09/23/2022 7:30 A M Medical Record Number: [id number] Patient Account Number: [id number] Date of Birth/Sex: Treating RN: 1965/04/19 (57 y.o. 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 Other chronic osteomyelitis, right ankle and foot E10.621 Type 1 diabetes mellitus with foot ulcer Facility Procedures LUCIEL, STOLP ([id number]): CPT4 Code Description Modifier [id number]-(Facility Use Only) HBOT full body chamber,  , ICD-10 Diagnosis Description E10.621 Type 1 diabetes mellitus with foot ulcer L97.514 Non-pressure chronic ulcer of other part  of right foot with necrosis o M86.671 Other chronic osteomyelitis, right ankle and foot [id number] Page 2 of 2: Quantity 4 f bone Physician Procedures : CPT4 Code Description Modifier [phone] - WC PHYS HYPERBARIC OXYGEN THERAPY ICD-10 Diagnosis Description E10.621 Type 1 diabetes mellitus with foot ulcer 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

## 2022-09-26 ENCOUNTER — Encounter (HOSPITAL_BASED_OUTPATIENT_CLINIC_OR_DEPARTMENT_OTHER): Payer: BC Managed Care – PPO | Admitting: Internal Medicine

## 2022-09-26 DIAGNOSIS — M86671 Other chronic osteomyelitis, right ankle and foot: Secondary | ICD-10-CM

## 2022-09-26 DIAGNOSIS — E10621 Type 1 diabetes mellitus with foot ulcer: Secondary | ICD-10-CM

## 2022-09-26 DIAGNOSIS — L97514 Non-pressure chronic ulcer of other part of right foot with necrosis of bone: Secondary | ICD-10-CM

## 2022-09-26 LAB — GLUCOSE, CAPILLARY
Glucose-Capillary: 172 mg/dL — ABNORMAL HIGH (ref 70–99)
Glucose-Capillary: 218 mg/dL — ABNORMAL HIGH (ref 70–99)

## 2022-09-26 NOTE — Progress Notes (Addendum)
[name] [name], [name] [name] ([id number]) [id number] Page 1 of 2 Visit Report for 09/26/2022 Arrival Information Details Patient Name: Date of Service: [name] [name], [name] [name] 09/26/2022 7:30 A M Medical Record Number: [id number] Patient Account Number: [id number] Date of Birth/Sex: Treating RN: 1965/11/20 (57 y.o. [name], Ladona Ridgel Primary Care Mailee Klaas: [name] Other Clinician: Haywood Pao Referring Brysten Reister: Treating Karell Tukes/Extender: [name], [name]: 10 Visit Information History Since Last Visit All ordered tests and consults were completed: Yes Patient Arrived: Wheel Chair Added or deleted any medications: No Arrival Time: 07:30 Any new allergies or adverse reactions: No Accompanied By: Husband Had a fall or experienced change in No Transfer Assistance: None activities of daily living that may affect Patient Identification Verified: Yes risk of falls: Secondary Verification Process Completed: Yes Signs or symptoms of abuse/neglect since last visito No Patient Requires Transmission-Based Precautions: No Hospitalized since last visit: No Patient Has Alerts: No Implantable device outside of the clinic excluding No cellular tissue based products placed in the center since last visit: Pain Present Now: No Electronic Signature(s) Signed: 09/26/2022 9:36:01 AM By: Haywood Pao CHT EMT BS , , Previous Signature: 09/26/2022 8:41:59 AM Version By: Karl Bales EMT Entered By: Haywood Pao on 09/26/2022 09:36:01 -------------------------------------------------------------------------------- Encounter Discharge Information Details Patient Name: Date of Service: [name] Milo D. 09/26/2022 7:30 A M Medical Record Number: [id number] Patient Account Number: [id number] Date of Birth/Sex: Treating RN: March 15, 1965 (57 y.o. [name] Primary Care Hong Moring: [name] Other Clinician: Karl Bales Referring Alyas Creary: Treating Jayde Mcallister/Extender: [name], [name]: 10 Encounter Discharge Information Items Discharge Condition: Stable Ambulatory Status: Wheelchair Discharge Destination: Home Transportation: Private Auto Accompanied By: Husband Schedule Follow-up Appointment: Yes Clinical Summary of Care: Electronic Signature(s) Signed: 09/26/2022 11:30:23 AM By: Karl Bales EMT Entered By: Karl Bales on 09/26/2022 11:30:23 [name] ([id number]) [id number] Page 2 of 2 -------------------------------------------------------------------------------- Vitals Details Patient Name: Date of Service: [name] [name], [name] [name] 09/26/2022 7:30 A M Medical Record Number: [id number] Patient Account Number: [id number] Date of Birth/Sex: Treating RN: [birth year] (57 y.o. [name] Primary Care Larae Caison: [name] Other Clinician: Haywood Pao Referring Jalyah Weinheimer: Treating Kadeisha Betsch/Extender: [name], [name]: 10 Vital Signs Time Taken: 07:47 Temperature (F): 97.2 Height (in): 65 Pulse (bpm): 68 Weight (lbs): 105 Respiratory Rate (breaths/min): 18 Body Mass Index (BMI): 17.5 Blood Pressure (mmHg): 108/77 Capillary Blood Glucose (mg/dl): 546 Reference Range: 80 - 120 mg / dl Electronic Signature(s) Signed: 09/26/2022 9:36:18 AM By: Haywood Pao CHT EMT BS , , Previous Signature: 09/26/2022 8:42:25 AM Version By: Karl Bales EMT Entered By: Haywood Pao on 09/26/2022 09:36:17

## 2022-09-26 NOTE — Progress Notes (Signed)
KHYLIN, GUYOT ([id number]) [id number] Page 1 of 2 Visit Report for 09/26/2022 HBO Details Patient Name: Date of Service: [name] [name], [name] [name] 09/26/2022 7:30 A M Medical Record Number: [id number] Patient Account Number: [id number] Date of Birth/Sex: Treating RN: 12-25-1965 (57 y.o. [name] Primary Care Kimmy Totten: [name] Other Clinician: Karl Bales Referring Ailsa Mireles: Treating Teka Chanda/Extender: [name], [name]: 10 HBO Treatment Course Details Treatment Course Number: 2 Ordering Jaishaun Mcnab: [name] T Treatments Ordered: otal 60 HBO Treatment Start Date: 07/18/2022 HBO Indication: Diabetic Ulcer(s) of the Lower Extremity Standard/Conservative Wound Care tried and failed greater than or equal to 30 days HBO Treatment Details Treatment Number: 47 Patient Type: Outpatient Chamber Type: Monoplace Chamber Serial #: [id number] Treatment Protocol: 2.0 ATA with 90 minutes oxygen, with two 5 minute air breaks Treatment Details Compression Rate Down: 2.0 psi / minute De-Compression Rate Up: A breaks and breathing ir Compress Tx Pressure periods Decompress Decompress Begins Reached (leave unused spaces Begins Ends blank) Chamber Pressure (ATA 1 2 2 2 2 2  --2 1 ) Clock Time (24 hr) 08:14 08:23 08:53 08:58 09:28 09:33 - - 10:03 10:10 Treatment Length: 116 (minutes) Treatment Segments: 4 Vital Signs Capillary Blood Glucose Reference Range: 80 - 120 mg / dl HBO Diabetic Blood Glucose Intervention Range: <131 mg/dl or >962 mg/dl Time Vitals Blood Respiratory Capillary Blood Glucose Pulse Action Type: Pulse: Temperature: Taken: Pressure: Rate: Glucose (mg/dl): Meter #: Oximetry (%) Taken: Pre 07:47 108/77 68 18 97.2 172 Post 10:15 147/82 64 20 97 218 Treatment Response Treatment Toleration: Well Treatment Completion Status: Treatment Completed without Adverse Event Additional Procedure  Documentation Tissue Sevierity: Necrosis of bone Physician HBO Attestation: I certify that I supervised this HBO treatment in accordance with Medicare guidelines. A trained emergency response team is readily available per Yes hospital policies and procedures. Continue HBOT as ordered. Yes Electronic Signature(s) Signed: 09/26/2022 5:19:21 PM By: [name] DO Previous Signature: 09/26/2022 11:28:12 AM Version By: Karl Bales EMT Previous Signature: 09/26/2022 9:06:47 AM Version By: Karl Bales EMT Entered By: [name] on 09/26/2022 16:22:42 [name] ([id number]) [id number] Page 2 of 2 -------------------------------------------------------------------------------- HBO Safety Checklist Details Patient Name: Date of Service: [name] [name], [name] [name] 09/26/2022 7:30 A M Medical Record Number: [id number] Patient Account Number: [id number] Date of Birth/Sex: Treating RN: [birth year] (57 y.o. [name] Primary Care Delphia Kaylor: [name] Other Clinician: Karl Bales Referring Yamilett Anastos: Treating Kirstine Jacquin/Extender: [name], [name]: 10 HBO Safety Checklist Items Safety Checklist Consent Form Signed Patient voided / foley secured and emptied When did you last eato 0630 Last dose of injectable or oral agent 0630 5 units Humalog Ostomy pouch emptied and vented if applicable NA All implantable devices assessed, documented and approved NA Intravenous access site secured and place NA Valuables secured Linens and cotton and cotton/polyester blend (less than 51% polyester) Personal oil-based products / skin lotions / body lotions removed Wigs or hairpieces removed Human Extensions approved Smoking or tobacco materials removed Books / newspapers / magazines / loose paper removed Cologne, aftershave, perfume and deodorant removed Jewelry removed (may wrap wedding band) Make-up removed Hair care  products removed Battery operated devices (external) removed Libre3 Heating patches and chemical warmers removed Titanium eyewear removed Nail polish cured greater than 10 hours Over 10 hours old Haematologist cured greater than 10 hours NA Hearing aids removed NA Loose dentures or partials removed NA Prosthetics have been removed NA Patient  demonstrates correct use of air break device (if applicable) Patient concerns have been addressed Patient grounding bracelet on and cord attached to chamber Specifics for Inpatients (complete in addition to above) Medication sheet sent with patient NA Intravenous medications needed or due during therapy sent with patient NA Drainage tubes (e.g. nasogastric tube or chest tube secured and vented) NA Endotracheal or Tracheotomy tube secured NA Cuff deflated of air and inflated with saline NA Airway suctioned NA Notes The safety checklist was done before the treatment was stated. Electronic Signature(s) Signed: 09/26/2022 8:44:23 AM By: Karl Bales EMT Entered By: Karl Bales on 09/26/2022 08:44:23

## 2022-09-27 ENCOUNTER — Encounter (HOSPITAL_BASED_OUTPATIENT_CLINIC_OR_DEPARTMENT_OTHER): Payer: BC Managed Care – PPO | Admitting: Internal Medicine

## 2022-09-27 DIAGNOSIS — L97514 Non-pressure chronic ulcer of other part of right foot with necrosis of bone: Secondary | ICD-10-CM

## 2022-09-27 DIAGNOSIS — M86671 Other chronic osteomyelitis, right ankle and foot: Secondary | ICD-10-CM

## 2022-09-27 DIAGNOSIS — E10621 Type 1 diabetes mellitus with foot ulcer: Secondary | ICD-10-CM

## 2022-09-27 LAB — GLUCOSE, CAPILLARY
Glucose-Capillary: 218 mg/dL — ABNORMAL HIGH (ref 70–99)
Glucose-Capillary: 114 mg/dL — ABNORMAL HIGH (ref 70–99)

## 2022-09-27 NOTE — Progress Notes (Addendum)
[name] [name] ([id number]) [id number] Page 1 of 2 Visit Report for 09/27/2022 HBO Details Patient Name: Date of Service: [name] [name], [name] [name] 09/27/2022 10:00 A M Medical Record Number: [id number] Patient Account Number: [id number] Date of Birth/Sex: Treating RN: [name] (57 y.o. [name] [name] Primary Care [name] [name]: [name] [name] Other Clinician: Haywood Pao Referring [name] [name]: Treating [name] [name]/Extender: [name] [name], [name] [name] [name] [name]: 11 HBO [name] Course Details [name] Course Number: 2 Ordering [name] [name]: [name] [name] T Treatments Ordered: otal 60 HBO [name] Start Date: 07/18/2022 HBO Indication: Diabetic Ulcer(s) of the Lower Extremity Standard/Conservative Wound Care tried and failed greater than or equal to 30 days HBO [name] Details [name] Number: [id number] Patient Type: Outpatient Chamber Type: Monoplace Chamber Serial #: [id number] [name] Protocol: 2.0 ATA with 90 minutes oxygen, with two 5 minute air breaks [name] Details Compression Rate Down: 1.5 psi / minute De-Compression Rate Up: 2.0 psi / minute A breaks and breathing ir Compress Tx Pressure periods Decompress Decompress Begins Reached (leave unused spaces Begins Ends blank) Chamber Pressure (ATA 1 2 2 2 2 2  --2 1 ) Clock Time (24 hr) 10:26 10:36 11:06 11:11 11:41 11:46 - - 12:17 12:23 [name] Length: 117 (minutes) [name] Segments: 4 Vital Signs Capillary Blood Glucose Reference Range: 80 - 120 mg / dl HBO Diabetic Blood Glucose Intervention Range: <131 mg/dl or >782 mg/dl Type: Time Vitals Blood Respiratory Capillary Blood Glucose Pulse Action Pulse: Temperature: Taken: Pressure: Rate: Glucose (mg/dl): Meter #: Oximetry (%) Taken: Pre 09:46 119/74 71 18 97.2 218 none per protocol Post 12:30 146/93 72 18 97.2 114 none per protocol [name] Response [name] Toleration: Well [name] Completion Status: [name]  Completed without Adverse Event [name] Notes [name] arrived with normal vital signs. She prepared for [name]. After performing a safety check, she was placed in the chamber which was compressed at a rate of 2 psi/min after confirming normal ear equalization. She tolerated the [name] and subsequent decompression at the rate of 2 psi/min. She denied issues with ear equalization and/or barotrauma. She was stable upon discharge to wound clinic for wound care encounter. Additional Procedure Documentation Tissue Sevierity: Necrosis of bone Physician HBO Attestation: I certify that I supervised this HBO [name] in accordance with Medicare guidelines. A trained emergency response team is readily available per Yes hospital policies and procedures. Continue HBOT as ordered. Yes Electronic Signature(s) Signed: 09/27/2022 4:37:23 PM By: [name] Corwin DO Previous Signature: 09/27/2022 2:30:13 PM Version By: Haywood Pao CHT EMT BS , , Previous Signature: 09/27/2022 1:45:20 PM Version By: Haywood Pao CHT EMT BS , , Previous Signature: 09/27/2022 1:48:04 PM Version By: Audley Hose, Vivianne Spence ([id number]) [id number] Page 2 of 2 Entered By: [name] [name] on 09/27/2022 15:22:32 -------------------------------------------------------------------------------- HBO Safety Checklist Details Patient Name: Date of Service: [name] [name], [name] [name] 09/27/2022 10:00 A M Medical Record Number: [id number] Patient Account Number: [id number] Date of Birth/Sex: Treating RN: [name] [name] (57 y.o. [name] [name] Primary Care Bynum Mccullars: [name] [name] Other Clinician: Haywood Pao Referring Shanisha Lech: Treating Amzie Sillas/Extender: [name] [name], [name] [name] [name] [name]: 11 HBO Safety Checklist Items Safety Checklist Consent Form Signed Patient voided / foley secured and emptied When did you last eato 0700 - Peanut butter toast, fruit  coffee Last dose of injectable or oral agent 0700 5 units Humalog Ostomy pouch emptied and vented if applicable NA All implantable devices assessed, documented and approved Freestyle Libre 2 and3 on approved list Intravenous access site secured and place NA  Valuables secured Linens and cotton and cotton/polyester blend (less than 51% polyester) Personal oil-based products / skin lotions / body lotions removed Wigs or hairpieces removed NA Smoking or tobacco materials removed NA Books / newspapers / magazines / loose paper removed Cologne, aftershave, perfume and deodorant removed Jewelry removed (may wrap wedding band) Make-up removed Hair care products removed Battery operated devices (external) removed Heating patches and chemical warmers removed Titanium eyewear removed Nail polish cured greater than 10 hours Casting material cured greater than 10 hours NA Hearing aids removed NA Loose dentures or partials removed NA Prosthetics have been removed NA Patient demonstrates correct use of air break device (if applicable) Patient concerns have been addressed Patient grounding bracelet on and cord attached to chamber Specifics for Inpatients (complete in addition to above) Medication sheet sent with patient NA Intravenous medications needed or due during therapy sent with patient NA Drainage tubes (e.g. nasogastric tube or chest tube secured and vented) NA Endotracheal or Tracheotomy tube secured NA Cuff deflated of air and inflated with saline NA Airway suctioned NA Notes Paper version used prior to [name] start. Electronic Signature(s) Signed: 09/27/2022 1:44:18 PM By: Haywood Pao CHT EMT BS , , Entered By: Haywood Pao on 09/27/2022 13:44:18

## 2022-09-27 NOTE — Progress Notes (Signed)
[name] [name], [name] [name] ([id number]) [id number] Page 1 of 8 Visit Report for 09/27/2022 Arrival Information Details Patient Name: Date of Service: [name] [name], [name] [name] 09/27/2022 12:30 PM Medical Record Number: [id number] Patient Account Number: [id number] Date of Birth/Sex: Treating RN: 30-May-1965 (57 y.o. F) Primary Care Jillayne Witte: [name] Other Clinician: Referring Yalexa Blust: Treating Diahn Waidelich/Extender: [name], [name]: 11 Visit Information History Since Last Visit All ordered tests and consults were completed: Yes Patient Arrived: Wheel Chair Added or deleted any medications: No Arrival Time: 12:40 Any new allergies or adverse reactions: No Accompanied By: driver Had a fall or experienced change in No Transfer Assistance: None activities of daily living that may affect Patient Identification Verified: Yes risk of falls: Secondary Verification Process Completed: Yes Signs or symptoms of abuse/neglect since last visito No Patient Requires Transmission-Based Precautions: No Hospitalized since last visit: No Patient Has Alerts: No Implantable device outside of the clinic excluding No cellular tissue based products placed in the center since last visit: Pain Present Now: No Electronic Signature(s) Signed: 09/27/2022 6:53:06 PM By: Haywood Pao CHT EMT BS , , Entered By: Haywood Pao on 09/27/2022 12:45:04 -------------------------------------------------------------------------------- Encounter Discharge Information Details Patient Name: Date of Service: [name] Milo D. 09/27/2022 12:30 PM Medical Record Number: [id number] Patient Account Number: [id number] Date of Birth/Sex: Treating RN: May 15, 1965 (57 y.o. [name] [name] Primary Care Jeff Frieden: [name] Other Clinician: Referring Serita Degroote: Treating Kennon Encinas/Extender: [name], [name]: 27 Encounter Discharge  Information Items Post Procedure Vitals Discharge Condition: Stable Temperature (F): 97.2 Ambulatory Status: Wheelchair Pulse (bpm): 72 Discharge Destination: Home Respiratory Rate (breaths/min): 18 Transportation: Private Auto Blood Pressure (mmHg): 146/93 Accompanied By: self Schedule Follow-up Appointment: Yes Clinical Summary of Care: Patient Declined Electronic Signature(s) Signed: 09/27/2022 5:06:56 PM By: Redmond Pulling RN, BSN Entered By: Redmond Pulling on 09/27/2022 13:23:55 [name] [name] ([id number]) [id number] Page 2 of 8 -------------------------------------------------------------------------------- Lower Extremity Assessment Details Patient Name: Date of Service: [name] [name], [name] [name] 09/27/2022 12:30 PM Medical Record Number: [id number] Patient Account Number: [id number] Date of Birth/Sex: Treating RN: August 19, 1965 (57 y.o. [name] [name] Primary Care Anthoni Geerts: [name] Other Clinician: Referring Tyger Wichman: Treating Rumaysa Sabatino/Extender: [name], [name]: 11 Edema Assessment Assessed: [Left: No] [Right: No] Edema: [Left: N] [Right: o] Calf Left: Right: Point of Measurement: From Medial Instep 26.2 cm Ankle Left: Right: Point of Measurement: From Medial Instep 19 cm Vascular Assessment Pulses: Dorsalis Pedis Palpable: [Right:Yes] Extremity colors, hair growth, and conditions: Extremity Color: [Right:Normal] Hair Growth on Extremity: [Right:No] Temperature of Extremity: [Right:Warm] Capillary Refill: [Right:< 3 seconds] Dependent Rubor: [Right:No No] Electronic Signature(s) Signed: 09/27/2022 5:06:56 PM By: Redmond Pulling RN, BSN Entered By: Redmond Pulling on 09/27/2022 12:47:59 -------------------------------------------------------------------------------- Multi Wound Chart Details Patient Name: Date of Service: [name] Milo D. 09/27/2022 12:30 PM Medical Record Number:  [id number] Patient Account Number: [id number] Date of Birth/Sex: Treating RN: 02/21/1966 (57 y.o. [name] [name] Primary Care [place] Perra: [name] Other Clinician: Referring Maryfer Tauzin: Treating Jayon Matton/Extender: [name], [name]: 11 Vital Signs Height(in): 65 Capillary Blood Glucose(mg/dl): 536 Weight(lbs): 644 Pulse(bpm): 72 Body Mass Index(BMI): 17.5 Blood Pressure(mmHg): 146/93 Temperature(F): 97.2 Respiratory Rate(breaths/min): 18 [4:Photos:] [N/A:N/A] Right Calcaneus N/A N/A Wound Location: Gradually Appeared N/A N/A Wounding Event: Diabetic Wound/Ulcer of the Lower N/A N/A Primary Etiology: Extremity Hypertension, Type I Diabetes, N/A N/A Comorbid History: Osteoarthritis, Osteomyelitis, Neuropathy 05/30/2022 N/A N/A Date Acquired:  11 N/A N/A Weeks of Treatment: Open N/A N/A Wound Status: No N/A N/A Wound Recurrence: 0.4x0.4x0.1 N/A N/A Measurements L x W x D (cm) 0.126 N/A N/A A (cm) : rea 0.013 N/A N/A Volume (cm) : 98.60% N/A N/A % Reduction in A rea: 99.50% N/A N/A % Reduction in Volume: Grade 2 N/A N/A Classification: Medium N/A N/A Exudate A mount: Serosanguineous N/A N/A Exudate Type: red, brown N/A N/A Exudate Color: Distinct, outline attached N/A N/A Wound Margin: Large (67-100%) N/A N/A Granulation A mount: Red N/A N/A Granulation Quality: None Present (0%) N/A N/A Necrotic A mount: Fat Layer (Subcutaneous Tissue): Yes N/A N/A Exposed Structures: Fascia: No Tendon: No Muscle: No Joint: No Bone: No Medium (34-66%) N/A N/A Epithelialization: Debridement - Excisional N/A N/A Debridement: Pre-procedure Verification/Time Out 12:58 N/A N/A Taken: Subcutaneous, Slough N/A N/A Tissue Debrided: Skin/Subcutaneous Tissue N/A N/A Level: 0.13 N/A N/A Debridement A (sq cm): rea Curette N/A N/A Instrument: Minimum N/A N/A Bleeding: Pressure N/A N/A Hemostasis A chieved: Procedure was  tolerated well N/A N/A Debridement Treatment Response: 0.4x0.4x0.1 N/A N/A Post Debridement Measurements L x W x D (cm) 0.013 N/A N/A Post Debridement Volume: (cm) No Abnormalities Noted N/A N/A Periwound Skin Texture: Maceration: Yes N/A N/A Periwound Skin Moisture: No Abnormalities Noted N/A N/A Periwound Skin Color: Hot N/A N/A Temperature: Cellular or Tissue Based Product N/A N/A Procedures Performed: Debridement Treatment Notes Wound #4 (Calcaneus) Wound Laterality: Right Cleanser Peri-Wound Care Topical Primary Dressing Secondary Dressing ADAPTIC TOUCH 3x4.25 in Discharge Instruction: Apply over primary dressing as directed. Zetuvit Plus Silicone Border Dressing 5x5 (in/in) Discharge Instruction: Apply silicone border over primary dressing as directed. Secured With [employer] Compression Stockings Add-Ons ADREANNA, MANS ([id number]) [phone].pdf Page 4 of 8 Electronic Signature(s) Signed: 09/27/2022 1:48:04 PM By: [name] DO Signed: 09/27/2022 6:21:10 PM By: Shawn Stall RN, BSN Entered By: [name] on 09/27/2022 13:23:20 -------------------------------------------------------------------------------- Multi-Disciplinary Care Plan Details Patient Name: Date of Service: [name] [name], [name] [name] 09/27/2022 12:30 PM Medical Record Number: [id number] Patient Account Number: [id number] Date of Birth/Sex: Treating RN: 13-Sep-1965 (57 y.o. [name] [name] Primary Care Shantoria Ellwood: [name] Other Clinician: Referring Meira Wahba: Treating Chistian Kasler/Extender: [name], [name]: 11 Active Inactive HBO Nursing Diagnoses: Anxiety related to feelings of confinement associated with the hyperbaric oxygen chamber Anxiety related to knowledge deficit of hyperbaric oxygen therapy and treatment procedures Potential for barotraumas to ears, sinuses, teeth, and lungs or cerebral gas embolism related to  changes in atmospheric pressure inside hyperbaric oxygen chamber Potential for oxygen toxicity seizures related to delivery of 100% oxygen at an increased atmospheric pressure Potential for pulmonary oxygen toxicity related to delivery of 100% oxygen at an increased atmospheric pressure Goals: Patient and/or family will be able to state/discuss factors appropriate to the management of their disease process during treatment Date Initiated: 07/12/2022 Target Resolution Date: 09/30/2022 Goal Status: Active Patient will tolerate the hyperbaric oxygen therapy treatment Date Initiated: 07/12/2022 Target Resolution Date: 09/23/2022 Goal Status: Active Patient/caregiver will verbalize understanding of HBO goals, rationale, procedures and potential hazards Date Initiated: 07/12/2022 Date Inactivated: 08/12/2022 Target Resolution Date: 08/09/2022 Goal Status: Met Interventions: Administer a five (5) minute air break for patient if signs and symptoms of seizure appear and notify the hyperbaric physician Administer a ten (10) minute air break for patient if signs and symptoms of seizure appear and notify the hyperbaric physician Administer decongestants, per physician orders, prior to HBO2 Administer the correct therapeutic gas delivery based  on the patients needs and limitations, per physician order Assess and provide for patients comfort related to the hyperbaric environment and equalization of middle ear Assess for signs and symptoms related to adverse events, including but not limited to confinement anxiety, pneumothorax, oxygen toxicity and baurotrauma Assess patient for any history of confinement anxiety Assess patient's knowledge and expectations regarding hyperbaric medicine and provide education related to the hyperbaric environment, goals of treatment and prevention of adverse events Implement protocols to decrease risk of pneumothorax in high risk patients Notes: Nutrition Nursing  Diagnoses: Imbalanced nutrition Impaired glucose control: actual or potential Goals: Patient/caregiver verbalizes understanding of need to maintain therapeutic glucose control per primary care physician Date Initiated: 07/12/2022 Target Resolution Date: 08/22/2022 Goal Status: Active Patient/caregiver will maintain therapeutic glucose control Date Initiated: 07/12/2022 Target Resolution Date: 09/23/2022 [name] [name], [name] [name] ([id number]) [phone].pdf Page 5 of 8 Goal Status: Active Interventions: Assess HgA1c results as ordered upon admission and as needed Assess patient nutrition upon admission and as needed per policy Provide education on elevated blood sugars and impact on wound healing Provide education on nutrition Treatment Activities: Education provided on Nutrition : 07/12/2022 Notes: Osteomyelitis Nursing Diagnoses: Infection: osteomyelitis Knowledge deficit related to disease process and management Goals: Patient/caregiver will verbalize understanding of disease process and disease management Date Initiated: 07/12/2022 Date Inactivated: 08/12/2022 Target Resolution Date: 08/18/2022 Goal Status: Met Patient's osteomyelitis will resolve Date Initiated: 07/12/2022 Target Resolution Date: 10/28/2022 Goal Status: Active Signs and symptoms for osteomyelitis will be recognized and promptly addressed Date Initiated: 07/12/2022 Target Resolution Date: 09/30/2022 Goal Status: Active Interventions: Assess for signs and symptoms of osteomyelitis resolution every visit Provide education on osteomyelitis Notes: Electronic Signature(s) Signed: 09/27/2022 5:06:56 PM By: Redmond Pulling RN, BSN Entered By: Redmond Pulling on 09/27/2022 12:54:51 -------------------------------------------------------------------------------- Pain Assessment Details Patient Name: Date of Service: [name] [name], [name] [name] 09/27/2022 12:30 PM Medical Record Number: [id number] Patient Account Number:  [id number] Date of Birth/Sex: Treating RN: 07-21-1965 (57 y.o. [name] [name] Primary Care Susie Pousson: [name] Other Clinician: Referring Donya Hitch: Treating Sayde Lish/Extender: [name], [name]: 11 Active Problems Location of Pain Severity and Description of Pain Patient Has Paino No Site Locations [place], [place] D ([id number]) [phone].pdf Page 6 of 8 Pain Management and Medication Current Pain Management: Electronic Signature(s) Signed: 09/27/2022 6:21:10 PM By: Shawn Stall RN, BSN Signed: 09/27/2022 6:53:06 PM By: Haywood Pao CHT EMT BS , , Entered By: Haywood Pao on 09/27/2022 12:46:27 -------------------------------------------------------------------------------- Patient/Caregiver Education Details Patient Name: Date of Service: [name] [name] 7/30/2024andnbsp12:30 PM Medical Record Number: [id number] Patient Account Number: [id number] Date of Birth/Gender: Treating RN: 02-10-1966 (57 y.o. [name] [name] Primary Care Physician: [name] Other Clinician: Referring Physician: Treating Physician/Extender: Caleen Essex GESH Weeks in Treatment: 11 Education Assessment Education Provided To: Patient Education Topics Provided Wound/Skin Impairment: Methods: Explain/Verbal Responses: State content correctly [employer]) Signed: 09/27/2022 5:06:56 PM By: Redmond Pulling RN, BSN Entered By: Redmond Pulling on 09/27/2022 12:55:32 -------------------------------------------------------------------------------- Wound Assessment Details Patient Name: Date of Service: [name] [name], [name] [name] 09/27/2022 12:30 PM Medical Record Number: [id number] Patient Account Number: [id number] Date of Birth/Sex: Treating RN: [birth year] (57 y.o. [name] [name] Primary Care Lennart Gladish: [name] Other Clinician: ZURIYA, ZARZA ([id number]) [id number]  Page 7 of 8 Referring Yailine Ballard: Treating Fleta Borgeson/Extender: [name], [name]: 11 Wound Status Wound Number: 4 Primary Diabetic Wound/Ulcer of the Lower Extremity Etiology: Wound Location: Right Calcaneus Wound Status:  Open Wounding Event: Gradually Appeared Comorbid Hypertension, Type I Diabetes, Osteoarthritis, Osteomyelitis, Date Acquired: 05/30/2022 History: Neuropathy Weeks Of Treatment: 11 Clustered Wound: No Photos Wound Measurements Length: (cm) 0.4 Width: (cm) 0.4 Depth: (cm) 0.1 Area: (cm) 0.126 Volume: (cm) 0.013 % Reduction in Area: 98.6% % Reduction in Volume: 99.5% Epithelialization: Medium (34-66%) Tunneling: No Undermining: No Wound Description Classification: Grade 2 Wound Margin: Distinct, outline attached Exudate Amount: Medium Exudate Type: Serosanguineous Exudate Color: red, brown Foul Odor After Cleansing: No Slough/Fibrino No Wound Bed Granulation Amount: Large (67-100%) Exposed Structure Granulation Quality: Red Fascia Exposed: No Necrotic Amount: None Present (0%) Fat Layer (Subcutaneous Tissue) Exposed: Yes Tendon Exposed: No Muscle Exposed: No Joint Exposed: No Bone Exposed: No Periwound Skin Texture Texture Color No Abnormalities Noted: Yes No Abnormalities Noted: Yes Moisture Temperature / Pain No Abnormalities Noted: No Temperature: Hot Maceration: Yes Treatment Notes Wound #4 (Calcaneus) Wound Laterality: Right Cleanser Peri-Wound Care Topical Primary Dressing Secondary Dressing ADAPTIC TOUCH 3x4.25 in Discharge Instruction: Apply over primary dressing as directed. Zetuvit Plus Silicone Border Dressing 5x5 (in/in) Discharge Instruction: Apply silicone border over primary dressing as directed. Secured With [name] [name], [name] [name] D ([id number]) [id number] Page 8 of 8 Compression Wrap Compression Stockings Add-Ons Electronic Signature(s) Signed: 09/27/2022 5:06:56 PM  By: Redmond Pulling RN, BSN Signed: 09/27/2022 6:21:10 PM By: Shawn Stall RN, BSN Entered By: Shawn Stall on 09/27/2022 12:52:50 -------------------------------------------------------------------------------- Vitals Details Patient Name: Date of Service: [name] [name], [name] [name] 09/27/2022 12:30 PM Medical Record Number: [id number] Patient Account Number: [id number] Date of Birth/Sex: Treating RN: 02/03/1966 (57 y.o. [name] [name], [name] Primary Care Kalimah Capurro: [name] Other Clinician: Haywood Pao Referring Taj Arteaga: Treating Aarika Moon/Extender: [name], [name]: 11 Vital Signs Time Taken: 12:30 Temperature (F): 97.2 Height (in): 65 Pulse (bpm): 72 Weight (lbs): 105 Respiratory Rate (breaths/min): 18 Body Mass Index (BMI): 17.5 Blood Pressure (mmHg): 146/93 Capillary Blood Glucose (mg/dl): 782 Reference Range: 80 - 120 mg / dl Electronic Signature(s) Signed: 09/27/2022 6:53:06 PM By: Haywood Pao CHT EMT BS , , Previous Signature: 09/27/2022 12:37:08 PM Version By: Haywood Pao CHT EMT BS , , Entered By: Haywood Pao on 09/27/2022 12:45:18

## 2022-09-27 NOTE — Progress Notes (Addendum)
[name] [name], [name] [name] ([id number]) [id number] Page 1 of 2 Visit Report for 09/27/2022 Arrival Information Details Patient Name: Date of Service: [name] [name], [name] [name] 09/27/2022 10:00 A M Medical Record Number: [id number] Patient Account Number: [id number] Date of Birth/Sex: Treating RN: January 09, 1966 (57 y.o. [name] Primary Care Jodeci Rini: [name] Other Clinician: Haywood Pao Referring Tajuanna Burnett: Treating Knute Mazzuca/Extender: Caleen Essex GESH Weeks in Treatment: 11 Visit Information History Since Last Visit All ordered tests and consults were completed: Yes Patient Arrived: Wheel Chair Added or deleted any medications: No Arrival Time: 10:28 Any new allergies or adverse reactions: No Accompanied By: self Had a fall or experienced change in No Transfer Assistance: None activities of daily living that may affect Patient Identification Verified: Yes risk of falls: Secondary Verification Process Completed: Yes Signs or symptoms of abuse/neglect since last visito No Patient Requires Transmission-Based Precautions: No Hospitalized since last visit: No Patient Has Alerts: No Implantable device outside of the clinic excluding No cellular tissue based products placed in the center since last visit: Pain Present Now: No Electronic Signature(s) Signed: 09/27/2022 1:36:12 PM By: Haywood Pao CHT EMT BS , , Entered By: Haywood Pao on 09/27/2022 13:36:11 -------------------------------------------------------------------------------- Encounter Discharge Information Details Patient Name: Date of Service: [name] Milo D. 09/27/2022 10:00 A M Medical Record Number: [id number] Patient Account Number: [id number] Date of Birth/Sex: Treating RN: [birth year] (57 y.o. [name] Primary Care Dorothea Yow: [name] Other Clinician: Haywood Pao Referring Chaia Ikard: Treating Laelia Angelo/Extender: Caleen Essex GESH Weeks in Treatment: 11 Encounter Discharge Information Items Discharge Condition: Stable Ambulatory Status: Wheelchair Discharge Destination: Other (Note Required) Transportation: Other Accompanied By: staff Schedule Follow-up Appointment: No Clinical Summary of Care: Notes Wound Care Encounter after treatment today. Electronic Signature(s) Signed: 09/27/2022 2:33:35 PM By: Haywood Pao CHT EMT BS , , Entered By: Haywood Pao on 09/27/2022 14:33:35 [name] [name] ([id number]) [id number] Page 2 of 2 -------------------------------------------------------------------------------- Vitals Details Patient Name: Date of Service: [name] [name], [name] [name] 09/27/2022 10:00 A M Medical Record Number: [id number] Patient Account Number: [id number] Date of Birth/Sex: Treating RN: Nov 09, 1965 (57 y.o. [name] Primary Care Deborah Dondero: [name] Other Clinician: Karl Bales Referring Dayyan Krist: Treating Sariya Trickey/Extender: [name], [name]: 11 Vital Signs Time Taken: 09:46 Temperature (F): 97.2 Height (in): 65 Pulse (bpm): 71 Weight (lbs): 105 Respiratory Rate (breaths/min): 18 Body Mass Index (BMI): 17.5 Blood Pressure (mmHg): 119/74 Capillary Blood Glucose (mg/dl): 536 Reference Range: 80 - 120 mg / dl Electronic Signature(s) Signed: 09/27/2022 1:37:16 PM By: Haywood Pao CHT EMT BS , , Entered By: Haywood Pao on 09/27/2022 13:37:15

## 2022-09-27 NOTE — Progress Notes (Signed)
CHRISSEY, RINGOR ([id number]) [id number] Page 1 of 1 Visit Report for 09/27/2022 SuperBill Details Patient Name: Date of Service: [name] [name], [name] [name] 09/27/2022 Medical Record Number: [id number] Patient Account Number: [id number] Date of Birth/Sex: Treating RN: 12/26/1965 (58 y.o. 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 Other chronic osteomyelitis, right ankle and foot E10.621 Type 1 diabetes mellitus with foot ulcer Facility Procedures CPT4 Code Description Modifier Quantity [id number] G0277-(Facility Use Only) HBOT full body chamber, , 4 ICD-10 Diagnosis Description E10.621 Type 1 diabetes mellitus with foot ulcer L97.514 Non-pressure chronic ulcer of other part of right foot with necrosis of bone M86.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

## 2022-09-27 NOTE — Progress Notes (Signed)
MIANICOLE, PENT ([id number]) [id number] Page 1 of 2 Visit Report for 09/26/2022 Problem List Details Patient Name: Date of Service: [name] [name], [name] [name] 09/26/2022 7:30 A M Medical Record Number: [id number] Patient Account Number: [id number] Date of Birth/Sex: Treating RN: May 26, 1965 (57 y.o. [name] Primary Care Provider: [name] Other Clinician: Karl Bales Referring Provider: Treating Provider/Extender: [name], [name]: 10 Active Problems ICD-10 Encounter Code Description Active Date MDM Diagnosis L97.514 Non-pressure chronic ulcer of other part of right foot with 07/12/2022 No Yes necrosis of bone M86.671 Other chronic osteomyelitis, right ankle and foot 07/12/2022 No Yes E10.621 Type 1 diabetes mellitus with foot ulcer 07/12/2022 No Yes Inactive Problems Resolved Problems Electronic Signature(s) Signed: 09/26/2022 11:28:57 AM By: Karl Bales EMT Signed: 09/26/2022 5:19:21 PM By: [name] DO Entered By: Karl Bales on 09/26/2022 11:28:57 -------------------------------------------------------------------------------- SuperBill Details Patient Name: Date of Service: [name] 09/26/2022 Medical Record Number: [id number] Patient Account Number: [id number] Date of Birth/Sex: Treating RN: 05/20/1965 (57 y.o. 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 Other chronic osteomyelitis, right ankle and foot E10.621 Type 1 diabetes mellitus with foot ulcer Facility Procedures KYRIANA, WIRZ ([id number]): CPT4 Code Description Modifier [id number] G0277-(Facility Use Only) HBOT full body  chamber, , ICD-10 Diagnosis Description E10.621 Type 1 diabetes mellitus with foot ulcer L97.514 Non-pressure chronic ulcer of other part  of right foot with necrosis o M86.671 Other chronic osteomyelitis, right ankle and foot [id number] Page 2 of 2: Quantity 4 f bone Physician Procedures : CPT4 Code Description Modifier [phone] [phone] - WC PHYS HYPERBARIC OXYGEN THERAPY ICD-10 Diagnosis Description E10.621 Type 1 diabetes mellitus with foot ulcer L97.514 Non-pressure chronic ulcer of other part of right foot with necrosis o M86.671 Other  chronic osteomyelitis, right ankle and foot Quantity: 1 f bone Electronic Signature(s) Signed: 09/26/2022 11:28:45 AM By: Karl Bales EMT Signed: 09/26/2022 5:19:21 PM By: [name] DO Entered By: Karl Bales on 09/26/2022 11:28:44

## 2022-09-28 ENCOUNTER — Encounter (HOSPITAL_BASED_OUTPATIENT_CLINIC_OR_DEPARTMENT_OTHER): Payer: BC Managed Care – PPO | Admitting: Physician Assistant

## 2022-09-28 DIAGNOSIS — E10621 Type 1 diabetes mellitus with foot ulcer: Secondary | ICD-10-CM | POA: Diagnosis not present

## 2022-09-28 LAB — GLUCOSE, CAPILLARY
Glucose-Capillary: 196 mg/dL — ABNORMAL HIGH (ref 70–99)
Glucose-Capillary: 176 mg/dL — ABNORMAL HIGH (ref 70–99)

## 2022-09-28 NOTE — Progress Notes (Signed)
FLOIS, STADER ([id number]) [id number] Page 1 of 2 Visit Report for 09/28/2022 HBO Details Patient Name: Date of Service: [name] [name], [name] [name] 09/28/2022 10:00 A M Medical Record Number: [id number] Patient Account Number: [id number] Date of Birth/Sex: Treating RN: Sep 24, 1965 (57 y.o. [name] [name], [name] Primary Care Mac Dowdell: [name] Other Clinician: Haywood Pao Referring Rudransh Bellanca: Treating Kleo Dungee/Extender: Alford Highland, [name]: 11 HBO Treatment Course Details Treatment Course Number: 2 Ordering Asmar Brozek: [name] T Treatments Ordered: otal 60 HBO Treatment Start Date: 07/18/2022 HBO Indication: Diabetic Ulcer(s) of the Lower Extremity Standard/Conservative Wound Care tried and failed greater than or equal to 30 days HBO Treatment Details Treatment Number: 49 Patient Type: Outpatient Chamber Type: Monoplace Chamber Serial #: [id number] Treatment Protocol: 2.0 ATA with 90 minutes oxygen, with two 5 minute air breaks Treatment Details Compression Rate Down: 1.5 psi / minute De-Compression Rate Up: 2.0 psi / minute A breaks and breathing ir Compress Tx Pressure periods Decompress Decompress Begins Reached (leave unused spaces Begins Ends blank) Chamber Pressure (ATA 1 2 2 2 2 2  --2 1 ) Clock Time (24 hr) 10:03 10:15 10:45 10:50 11:20 11:25 - - 11:55 12:04 Treatment Length: 121 (minutes) Treatment Segments: 4 Vital Signs Capillary Blood Glucose Reference Range: 80 - 120 mg / dl HBO Diabetic Blood Glucose Intervention Range: <131 mg/dl or >536 mg/dl Type: Time Vitals Blood Respiratory Capillary Blood Glucose Pulse Action Pulse: Temperature: Taken: Pressure: Rate: Glucose (mg/dl): Meter #: Oximetry (%) Taken: Pre 09:30 105/68 72 18 97.3 196 none per protocol Post 12:07 151/82 70 18 97.5 176 none per protocol Treatment Response Treatment Toleration: Well Treatment Completion Status: Treatment Completed  without Adverse Event Treatment Notes [name] arrived with normal vital signs. She prepared for treatment. 8 oz orange juice was sent in with patient for safety/intervention. After performing a safety check, she was placed in the chamber which was compressed at a rate of 2 psi/min after confirming normal ear equalization. She tolerated the treatment and subsequent decompression at the rate of 2 psi/min. She did not use the orange juice that was sent in with her. She denied issues with ear equalization and/or barotrauma. Her post treatment vital signs were within normal range. She was stable upon discharge. Additional Procedure Documentation Tissue Sevierity: Necrosis of bone Electronic Signature(s) Signed: 09/28/2022 12:26:32 PM By: Haywood Pao CHT EMT BS , , Signed: 09/28/2022 4:54:39 PM By: Allen Derry PA-C Entered By: Haywood Pao on 09/28/2022 12:26:32 [name] [name] ([id number]) [id number] Page 2 of 2 -------------------------------------------------------------------------------- HBO Safety Checklist Details Patient Name: Date of Service: [name] [name], [name] [name] 09/28/2022 10:00 A M Medical Record Number: [id number] Patient Account Number: [id number] Date of Birth/Sex: Treating RN: April 25, 1965 (57 y.o. [name] [name], [name] Primary Care Madailein Londo: [name] Other Clinician: Haywood Pao Referring Krishna Dancel: Treating Keyondra Lagrand/Extender: Alford Highland, [name]: 11 HBO Safety Checklist Items Safety Checklist Consent Form Signed Patient voided / foley secured and emptied When did you last eato 0800 - Peanut butter toast, coffee, fruit Last dose of injectable or oral agent 0800 5 Humalog Ostomy pouch emptied and vented if applicable NA All implantable devices assessed, documented and approved Freestyle Libre 2 and3 on approved list Intravenous access site secured and place NA Valuables secured Linens and cotton and  cotton/polyester blend (less than 51% polyester) Personal oil-based products / skin lotions / body lotions removed Wigs or hairpieces removed NA Smoking or tobacco materials removed NA Books /  newspapers / magazines / loose paper removed Cologne, aftershave, perfume and deodorant removed Jewelry removed (may wrap wedding band) Make-up removed Hair care products removed Human Hair extensions approved Libre Freestyle 2 and 3 Sensor Approved, other Battery operated devices (external) removed electronics removed. Heating patches and chemical warmers removed Titanium eyewear removed Nail polish cured greater than 10 hours NA Casting material cured greater than 10 hours NA Hearing aids removed NA Loose dentures or partials removed NA Prosthetics have been removed NA Patient demonstrates correct use of air break device (if applicable) Patient concerns have been addressed Patient grounding bracelet on and cord attached to chamber Specifics for Inpatients (complete in addition to above) Medication sheet sent with patient NA Intravenous medications needed or due during therapy sent with patient NA Drainage tubes (e.g. nasogastric tube or chest tube secured and vented) NA Endotracheal or Tracheotomy tube secured NA Cuff deflated of air and inflated with saline NA Airway suctioned NA Notes Paper version used prior to treatment start. Electronic Signature(s) Signed: 09/28/2022 12:21:26 PM By: Haywood Pao CHT EMT BS , , Entered By: Haywood Pao on 09/28/2022 12:21:25

## 2022-09-28 NOTE — Progress Notes (Signed)
HEAVYNN, [name] [name] ([id number]) [id number] Page 1 of 2 Visit Report for 09/28/2022 Arrival Information Details Patient Name: Date of Service: [name] [name], [name] [name] 09/28/2022 10:00 A M Medical Record Number: [id number] Patient Account Number: [id number] Date of Birth/Sex: Treating RN: [birth year] (57 y.o. [name], [name] Primary Care Wylee Ogden: [name] Other Clinician: Haywood Pao Referring Alize Acy: Treating Willine Schwalbe/Extender: Alford Highland, [name]: 11 Visit Information History Since Last Visit All ordered tests and consults were completed: Yes Patient Arrived: Ambulatory Added or deleted any medications: No Arrival Time: 09:30 Any new allergies or adverse reactions: No Accompanied By: self Had a fall or experienced change in No Transfer Assistance: None activities of daily living that may affect Patient Identification Verified: Yes risk of falls: Secondary Verification Process Completed: Yes Signs or symptoms of abuse/neglect since last visito No Patient Requires Transmission-Based Precautions: No Hospitalized since last visit: No Patient Has Alerts: No Implantable device outside of the clinic excluding No cellular tissue based products placed in the center since last visit: Pain Present Now: No Electronic Signature(s) Signed: 09/28/2022 12:16:13 PM By: Haywood Pao CHT EMT BS , , Entered By: Haywood Pao on 09/28/2022 12:16:13 -------------------------------------------------------------------------------- Encounter Discharge Information Details Patient Name: Date of Service: [name] Milo D. 09/28/2022 10:00 A M Medical Record Number: [id number] Patient Account Number: [id number] Date of Birth/Sex: Treating RN: Mar 15, 1965 (57 y.o. [name] Primary Care Jeniece Hannis: [name] Other Clinician: Haywood Pao Referring Sevin Farone: Treating Asir Bingley/Extender: Alford Highland, YO  GESH Weeks in Treatment: 43 Encounter Discharge Information Items Discharge Condition: Stable Ambulatory Status: Ambulatory Discharge Destination: Home Transportation: Other Accompanied By: driver Schedule Follow-up Appointment: No Clinical Summary of Care: Electronic Signature(s) Signed: 09/28/2022 12:27:19 PM By: Haywood Pao CHT EMT BS , , Entered By: Haywood Pao on 09/28/2022 12:27:18 [name] ([id number]) [id number] Page 2 of 2 -------------------------------------------------------------------------------- Vitals Details Patient Name: Date of Service: [name] [name], [name] [name] 09/28/2022 10:00 A M Medical Record Number: [id number] Patient Account Number: [id number] Date of Birth/Sex: Treating RN: 10/03/1965 (57 y.o. [name], [name] Primary Care Dilara Navarrete: [name] Other Clinician: Haywood Pao Referring Dougles Kimmey: Treating Kameshia Madruga/Extender: Alford Highland, [name]: 11 Vital Signs Time Taken: 09:30 Temperature (F): 97.3 Height (in): 65 Pulse (bpm): 72 Weight (lbs): 105 Respiratory Rate (breaths/min): 18 Body Mass Index (BMI): 17.5 Blood Pressure (mmHg): 105/68 Capillary Blood Glucose (mg/dl): 010 Reference Range: 80 - 120 mg / dl Electronic Signature(s) Signed: 09/28/2022 12:18:53 PM By: Haywood Pao CHT EMT BS , , Entered By: Haywood Pao on 09/28/2022 12:18:53

## 2022-09-29 ENCOUNTER — Encounter (HOSPITAL_BASED_OUTPATIENT_CLINIC_OR_DEPARTMENT_OTHER): Payer: BC Managed Care – PPO | Attending: Internal Medicine | Admitting: Internal Medicine

## 2022-09-29 DIAGNOSIS — N183 Chronic kidney disease, stage 3 unspecified: Secondary | ICD-10-CM | POA: Insufficient documentation

## 2022-09-29 DIAGNOSIS — E1022 Type 1 diabetes mellitus with diabetic chronic kidney disease: Secondary | ICD-10-CM | POA: Diagnosis not present

## 2022-09-29 DIAGNOSIS — M1990 Unspecified osteoarthritis, unspecified site: Secondary | ICD-10-CM | POA: Diagnosis not present

## 2022-09-29 DIAGNOSIS — E039 Hypothyroidism, unspecified: Secondary | ICD-10-CM | POA: Diagnosis not present

## 2022-09-29 DIAGNOSIS — L97514 Non-pressure chronic ulcer of other part of right foot with necrosis of bone: Secondary | ICD-10-CM | POA: Diagnosis not present

## 2022-09-29 DIAGNOSIS — I129 Hypertensive chronic kidney disease with stage 1 through stage 4 chronic kidney disease, or unspecified chronic kidney disease: Secondary | ICD-10-CM | POA: Insufficient documentation

## 2022-09-29 DIAGNOSIS — M86671 Other chronic osteomyelitis, right ankle and foot: Secondary | ICD-10-CM | POA: Diagnosis not present

## 2022-09-29 DIAGNOSIS — E10621 Type 1 diabetes mellitus with foot ulcer: Secondary | ICD-10-CM | POA: Insufficient documentation

## 2022-09-29 DIAGNOSIS — E1042 Type 1 diabetes mellitus with diabetic polyneuropathy: Secondary | ICD-10-CM | POA: Diagnosis not present

## 2022-09-29 DIAGNOSIS — Z89429 Acquired absence of other toe(s), unspecified side: Secondary | ICD-10-CM | POA: Diagnosis not present

## 2022-09-29 DIAGNOSIS — E1051 Type 1 diabetes mellitus with diabetic peripheral angiopathy without gangrene: Secondary | ICD-10-CM | POA: Insufficient documentation

## 2022-09-29 DIAGNOSIS — Z794 Long term (current) use of insulin: Secondary | ICD-10-CM | POA: Diagnosis not present

## 2022-09-29 LAB — GLUCOSE, CAPILLARY
Glucose-Capillary: 307 mg/dL — ABNORMAL HIGH (ref 70–99)
Glucose-Capillary: 97 mg/dL (ref 70–99)

## 2022-09-29 NOTE — Progress Notes (Signed)
FAITHLYN, DOMRES ([id number]) [id number] Page 1 of 1 Visit Report for 09/28/2022 SuperBill Details Patient Name: Date of Service: [name] [name], [name] [name] 09/28/2022 Medical Record Number: [id number] Patient Account Number: [id number] Date of Birth/Sex: Treating RN: 10-May-1965 (57 y.o. 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 Other chronic osteomyelitis, right ankle and foot E10.621 Type 1 diabetes mellitus with foot ulcer Facility Procedures CPT4 Code Description Modifier Quantity [id number] G0277-(Facility Use Only) HBOT full body chamber, , 4 ICD-10 Diagnosis Description E10.621 Type 1 diabetes mellitus with foot ulcer 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

## 2022-09-29 NOTE — Progress Notes (Addendum)
[name], [name] ([id number]) [id number] Page 1 of 2 Visit Report for 09/29/2022 HBO Details Patient Name: Date of Service: [name] [name], [name] [name] 09/29/2022 10:00 A M Medical Record Number: [id number] Patient Account Number: [id number] Date of Birth/Sex: Treating RN: 1965/07/15 (57 y.o. [name] [name], [name] [name] Primary Care Efton Thomley: [name] Other Clinician: Haywood Pao Referring Katai Marsico: Treating Thoms Barthelemy/Extender: [name], [name]: 11 HBO Treatment Course Details Treatment Course Number: 2 Ordering Lucca Greggs: [name] T Treatments Ordered: otal 60 HBO Treatment Start Date: 07/18/2022 HBO Indication: Diabetic Ulcer(s) of the Lower Extremity Standard/Conservative Wound Care tried and failed greater than or equal to 30 days HBO Treatment Details Treatment Number: 50 Patient Type: Outpatient Chamber Type: Monoplace Chamber Serial #: [id number] Treatment Protocol: 2.0 ATA with 90 minutes oxygen, with two 5 minute air breaks Treatment Details Compression Rate Down: 1.5 psi / minute De-Compression Rate Up: 2.0 psi / minute A breaks and breathing ir Compress Tx Pressure periods Decompress Decompress Begins Reached (leave unused spaces Begins Ends blank) Chamber Pressure (ATA 1 2 2 2 2 2  --2 1 ) Clock Time (24 hr) 10:00 10:12 10:43 10:48 11:18 11:23 - - 11:53 12:03 Treatment Length: 123 (minutes) Treatment Segments: 4 Vital Signs Capillary Blood Glucose Reference Range: 80 - 120 mg / dl HBO Diabetic Blood Glucose Intervention Range: <131 mg/dl or >782 mg/dl Type: Time Vitals Blood Pulse: Respiratory Temperature: Capillary Blood Glucose Pulse Action Taken: Pressure: Rate: Glucose (mg/dl): Meter #: Oximetry (%) Taken: Pre 09:36 102/66 75 18 97.1 307 none per protocol Post 12:06 125/87 74 18 97.3 97 Given 8 oz Glucerna and 8 oz Orange Juice Treatment Response Treatment Toleration: Well Treatment Completion Status:  Treatment Completed without Adverse Event Treatment Notes [name] [name] arrived with normal vital signs and blood glucose level of 307 mg/dL. She prepared for treatment. 8 oz orange juice was sent in with patient for safety/intervention as her [name] device was showing a downward trend. After performing a safety check, she was placed in the chamber which was compressed at a rate of 2 psi/min after confirming normal ear equalization. She tolerated the treatment and subsequent decompression at the rate of 2 psi/min. She drank part of the orange juice prior to decompression. She denied issues with ear equalization and/or barotrauma. Her post treatment vital signs were within normal range except for blood glucose level of 97 mg/dL. She was given 8 oz Glucerna and finished the orange juice that she had. After 15 minutes she was asymptomatic for hypoglycemia. She was stable upon discharge with her driver. Additional Procedure Documentation Tissue Sevierity: Necrosis of bone Physician HBO Attestation: I certify that I supervised this HBO treatment in accordance with Medicare guidelines. A trained emergency response team is readily available per Yes hospital policies and procedures. Continue HBOT as ordered. Yes Electronic Signature(s) Signed: 09/29/2022 4:31:19 PM By: Audley Hose, Signed: 09/29/2022 4:31:19 PM By: Sinda Du D ([id number]) [id number] Page 2 of 2 Previous Signature: 09/29/2022 1:30:01 PM Version By: Haywood Pao CHT EMT BS , , Entered By: [name] on 09/29/2022 16:30:05 -------------------------------------------------------------------------------- HBO Safety Checklist Details Patient Name: Date of Service: BRITTANI, GLIDEWELL 09/29/2022 10:00 A M Medical Record Number: [id number] Patient Account Number: [id number] Date of Birth/Sex: Treating RN: 1965/11/13 (57 y.o. [name] [name] Standard Primary Care Randol Zumstein: [name] Other Clinician: Haywood Pao Referring [name] [name]: Treating Deklynn Charlet/Extender: [name], [name]: 11 HBO Safety Checklist Items Safety Checklist  Consent Form Signed Patient voided / foley secured and emptied When did you last eato 0800 Bagel, Chream Cheese, Fruit, Coffee Last dose of injectable or oral agent 0816 -Humalog Ostomy pouch emptied and vented if applicable NA All implantable devices assessed, documented and approved Freestyle Libre 2 and3 on approved list Intravenous access site secured and place NA Valuables secured NA Linens and cotton and cotton/polyester blend (less than 51% polyester) Personal oil-based products / skin lotions / body lotions removed Wigs or hairpieces removed NA Smoking or tobacco materials removed NA Books / newspapers / magazines / loose paper removed Cologne, aftershave, perfume and deodorant removed Jewelry removed (may wrap wedding band) Make-up removed Hair care products removed Battery operated devices (external) removed Heating patches and chemical warmers removed Titanium eyewear removed Nail polish cured greater than 10 hours Casting material cured greater than 10 hours NA Hearing aids removed NA Loose dentures or partials removed NA Prosthetics have been removed NA Patient demonstrates correct use of air break device (if applicable) Patient concerns have been addressed Patient grounding bracelet on and cord attached to chamber Specifics for Inpatients (complete in addition to above) Medication sheet sent with patient NA Intravenous medications needed or due during therapy sent with patient NA Drainage tubes (e.g. nasogastric tube or chest tube secured and vented) NA Endotracheal or Tracheotomy tube secured NA Cuff deflated of air and inflated with saline NA Airway suctioned NA Notes Paper version used prior to treatment start. Electronic Signature(s) Signed: 09/29/2022  1:20:19 PM By: Haywood Pao CHT EMT BS , , Entered By: Haywood Pao on 09/29/2022 13:20:18

## 2022-09-29 NOTE — Progress Notes (Signed)
[name] [name] ([id number]) [id number] Page 1 of 2 Visit Report for 09/29/2022 Arrival Information Details Patient Name: Date of Service: [name] [name], [name] [name] 09/29/2022 10:00 A M Medical Record Number: [id number] Patient Account Number: [id number] Date of Birth/Sex: Treating RN: October 31, 1965 (57 y.o. [name] Primary Care Blessing Zaucha: [name] Other Clinician: Haywood Pao Referring Kerstyn Coryell: Treating Blanche Gallien/Extender: [name], [name]: 11 Visit Information History Since Last Visit All ordered tests and consults were completed: Yes Patient Arrived: Wheel Chair Added or deleted any medications: No Arrival Time: 09:32 Any new allergies or adverse reactions: No Accompanied By: self Had a fall or experienced change in No Transfer Assistance: None activities of daily living that may affect Patient Identification Verified: Yes risk of falls: Secondary Verification Process Completed: Yes Signs or symptoms of abuse/neglect since last visito No Patient Requires Transmission-Based Precautions: No Hospitalized since last visit: No Patient Has Alerts: No Implantable device outside of the clinic excluding No cellular tissue based products placed in the center since last visit: Pain Present Now: No Electronic Signature(s) Signed: 09/29/2022 1:17:59 PM By: Haywood Pao CHT EMT BS , , Entered By: Haywood Pao on 09/29/2022 13:17:59 -------------------------------------------------------------------------------- Encounter Discharge Information Details Patient Name: Date of Service: [name] Milo D. 09/29/2022 10:00 A M Medical Record Number: [id number] Patient Account Number: [id number] Date of Birth/Sex: Treating RN: Jul 04, 1965 (57 y.o. [name] Primary Care Gibson Lad: [name] Other Clinician: Haywood Pao Referring Karlie Aung: Treating Malisa Ruggiero/Extender: Caleen Essex  GESH Weeks in Treatment: 11 Encounter Discharge Information Items Discharge Condition: Stable Ambulatory Status: Wheelchair Discharge Destination: Home Transportation: Other Accompanied By: driver Schedule Follow-up Appointment: No Clinical Summary of Care: Electronic Signature(s) Signed: 09/29/2022 1:31:23 PM By: Haywood Pao CHT EMT BS , , Entered By: Haywood Pao on 09/29/2022 13:31:23 [name] ([id number]) [id number].pdf Page 2 of 2 -------------------------------------------------------------------------------- Vitals Details Patient Name: Date of Service: [name] [name] 09/29/2022 10:00 A M Medical Record Number: [id number] Patient Account Number: [id number] Date of Birth/Sex: Treating RN: Dec 03, 1965 (57 y.o. [name] Primary Care Jeno Calleros: [name] Other Clinician: Haywood Pao Referring Arlynn Stare: Treating Sherrey North/Extender: [name], [name]: 11 Vital Signs Time Taken: 09:36 Temperature (F): 97.1 Height (in): 65 Pulse (bpm): 75 Weight (lbs): 105 Respiratory Rate (breaths/min): 18 Body Mass Index (BMI): 17.5 Blood Pressure (mmHg): 102/66 Capillary Blood Glucose (mg/dl): 854 Reference Range: 80 - 120 mg / dl Electronic Signature(s) Signed: 09/29/2022 1:18:23 PM By: Haywood Pao CHT EMT BS , , Entered By: Haywood Pao on 09/29/2022 13:18:23

## 2022-09-29 NOTE — Progress Notes (Signed)
[name] [name], [name] [name] ([id number]) [id number] Page 1 of 1 Visit Report for 09/29/2022 SuperBill Details Patient Name: Date of Service: [name] [name], [name] [name] 09/29/2022 Medical Record Number: [id number] Patient Account Number: [id number] Date of Birth/Sex: Treating RN: 03-Feb-1966 (57 y.o. 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 Other chronic osteomyelitis, right ankle and foot E10.621 Type 1 diabetes mellitus with foot ulcer Facility Procedures CPT4 Code Description Modifier Quantity [id number] G0277-(Facility Use Only) HBOT full body chamber, , 4 ICD-10 Diagnosis Description E10.621 Type 1 diabetes mellitus with foot ulcer L97.514 Non-pressure chronic ulcer of other part of right foot with necrosis of bone M86.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

## 2022-09-30 ENCOUNTER — Encounter (HOSPITAL_BASED_OUTPATIENT_CLINIC_OR_DEPARTMENT_OTHER): Payer: BC Managed Care – PPO | Admitting: Internal Medicine

## 2022-09-30 DIAGNOSIS — M86671 Other chronic osteomyelitis, right ankle and foot: Secondary | ICD-10-CM

## 2022-09-30 DIAGNOSIS — E10621 Type 1 diabetes mellitus with foot ulcer: Secondary | ICD-10-CM | POA: Diagnosis not present

## 2022-09-30 DIAGNOSIS — L97514 Non-pressure chronic ulcer of other part of right foot with necrosis of bone: Secondary | ICD-10-CM

## 2022-09-30 LAB — GLUCOSE, CAPILLARY
Glucose-Capillary: 252 mg/dL — ABNORMAL HIGH (ref 70–99)
Glucose-Capillary: 54 mg/dL — ABNORMAL LOW (ref 70–99)

## 2022-09-30 NOTE — Progress Notes (Addendum)
BRYLAN, SEUBERT ([id number]) [id number] Page 1 of 2 Visit Report for 09/30/2022 Arrival Information Details Patient Name: Date of Service: [name] [name], [name] [name] 09/30/2022 7:30 A M Medical Record Number: [id number] Patient Account Number: [id number] Date of Birth/Sex: Treating RN: November 21, 1965 (57 y.o. [name] Primary Care : [name] Other Clinician: Haywood Pao Referring : Treating /Extender: [name], [name]: 11 Visit Information History Since Last Visit All ordered tests and consults were completed: Yes Patient Arrived: Wheel Chair Added or deleted any medications: No Arrival Time: 07:34 Any new allergies or adverse reactions: No Accompanied By: spouse Had a fall or experienced change in No Transfer Assistance: None activities of daily living that may affect Patient Identification Verified: Yes risk of falls: Secondary Verification Process Completed: Yes Signs or symptoms of abuse/neglect since last visito No Patient Requires Transmission-Based Precautions: No Hospitalized since last visit: No Patient Has Alerts: No Implantable device outside of the clinic excluding No cellular tissue based products placed in the center since last visit: Pain Present Now: No Electronic Signature(s) Signed: 09/30/2022 9:40:18 AM By: Haywood Pao CHT EMT BS , , Entered By: Haywood Pao on 09/30/2022 09:40:18 -------------------------------------------------------------------------------- Encounter Discharge Information Details Patient Name: Date of Service: [name] Milo D. 09/30/2022 7:30 A M Medical Record Number: [id number] Patient Account Number: [id number] Date of Birth/Sex: Treating RN: 08/01/1965 (57 y.o. [name] Primary Care : [name] Other Clinician: Haywood Pao Referring : Treating /Extender: Caleen Essex GESH Weeks in Treatment: 11 Encounter Discharge Information Items Discharge Condition: Stable Ambulatory Status: Ambulatory Discharge Destination: Home Transportation: Private Auto Accompanied By: spouse Schedule Follow-up Appointment: No Clinical Summary of Care: Electronic Signature(s) Signed: 09/30/2022 11:42:47 AM By: Haywood Pao CHT EMT BS , , Entered By: Haywood Pao on 09/30/2022 11:42:47 [name] ([id number]) [id number] Page 2 of 2 -------------------------------------------------------------------------------- Vitals Details Patient Name: Date of Service: [name] [name], [name] [name] 09/30/2022 7:30 A M Medical Record Number: [id number] Patient Account Number: [id number] Date of Birth/Sex: Treating RN: 1965-10-29 (57 y.o. [name] Primary Care : [name] Other Clinician: Karl Bales Referring : Treating /Extender: [name], [name]: 11 Vital Signs Time Taken: 07:51 Temperature (F): 97.1 Height (in): 65 Pulse (bpm): 72 Weight (lbs): 105 Respiratory Rate (breaths/min): 18 Body Mass Index (BMI): 17.5 Blood Pressure (mmHg): 100/63 Capillary Blood Glucose (mg/dl): 732 Reference Range: 80 - 120 mg / dl Electronic Signature(s) Signed: 09/30/2022 9:40:51 AM By: Haywood Pao CHT EMT BS , , Entered By: Haywood Pao on 09/30/2022 09:40:50

## 2022-09-30 NOTE — Progress Notes (Addendum)
[name] [name] ([id number]) [id number] Page 1 of 3 Visit Report for 09/30/2022 HBO Details Patient Name: Date of Service: [name] [name], [name] [name] 09/30/2022 7:30 A M Medical Record Number: [id number] Patient Account Number: [id number] Date of Birth/Sex: Treating RN: 15-Jul-1965 (57 y.o. [name] [name] Primary Care : [name] [name] Other Clinician: Haywood [name] Referring : Treating /Extender: [name] [name], [name]: 11 HBO Treatment Course Details Treatment Course Number: 2 Ordering : [name] [name] T Treatments Ordered: otal 60 HBO Treatment Start Date: 07/18/2022 HBO Indication: Diabetic Ulcer(s) of the Lower Extremity Standard/Conservative Wound Care tried and failed greater than or equal to 30 days HBO Treatment Details Treatment Number: 51 Patient Type: Outpatient Chamber Type: Monoplace Chamber Serial #: [id number] Treatment Protocol: 2.0 ATA with 90 minutes oxygen, with two 5 minute air breaks Treatment Details Compression Rate Down: 1.5 psi / minute De-Compression Rate Up: 2.0 psi / minute A breaks and breathing ir Compress Tx Pressure periods Decompress Decompress Begins Reached (leave unused spaces Begins Ends blank) Chamber Pressure (ATA 1 2 2 2 2 2  --2 1 ) Clock Time (24 hr) 08:18 08:30 09:00 09:05 09:35 09:40 - - 10:10 10:20 Treatment Length: 122 (minutes) Treatment Segments: 4 Vital Signs Capillary Blood Glucose Reference Range: 80 - 120 mg / dl HBO Diabetic Blood Glucose Intervention Range: <131 mg/dl or >034 mg/dl Type: Time Vitals Blood Respiratory Capillary Blood Glucose Pulse Action Pulse: Temperature: Taken: Pressure: Rate: Glucose (mg/dl): Meter #: Oximetry (%) Taken: Pre 07:51 100/63 72 18 97.1 252 none per protocol Post 10:23 143/111 68 18 98.4 54 none per protocol Treatment Response Treatment Toleration: Well Treatment Completion Status: Treatment  Completed without Adverse Event Treatment Notes [name] [name] arrived with normal vital signs and blood glucose level of 252 mg/dL. She stated that she ate peanut butter toast and fruit and took 6 units of Humalog at 0730. She prepared for treatment. 8 oz orange juice was sent in with patient for safety/intervention. After performing a safety check, she was placed in the chamber which was compressed at a rate of 2 psi/min after confirming normal ear equalization. She tolerated the treatment and subsequent decompression at the rate of 2 psi/min. She denied issues with ear equalization and/or barotrauma. Her post treatment vital signs were within normal range except for blood glucose level of 54 mg/dL. She was given 8 oz Glucerna and drank the orange juice that she had. After 15 minutes she was asymptomatic for hypoglycemia. She was stable upon discharge with her spouse driving. She stated that her [name] [name] was showing an upward trend prior to leaving and she will eat after departing wound care. 1141 Follow up phone call to [name] [name], [name] [name]'s husband to make sure she was doing fine. He stated that they stopped for a meal and she was doing fine. Additional Procedure Documentation Tissue Sevierity: Necrosis of bone Physician HBO Attestation: I certify that I supervised this HBO treatment in accordance with Medicare guidelines. A trained emergency response team is readily available per [name] policies and procedures. Continue HBOT as ordered. [address] [name] [name] ([id number]) [id number] Page 2 of 3 Electronic Signature(s) Signed: 10/03/2022 4:29:36 PM By: [name] Corwin DO Previous Signature: 09/30/2022 11:42:09 AM Version By: [name] [name] CHT EMT BS , , Previous Signature: 09/30/2022 11:29:46 AM Version By: [name] [name] CHT EMT BS , , Previous Signature: 09/30/2022 11:32:50 AM Version By: [name] Corwin DO Previous Signature: 09/30/2022 9:44:23 AM Version By:  [name] [name] CHT EMT BS , ,  Entered By: [name] [name] on 10/03/2022 15:27:13 -------------------------------------------------------------------------------- HBO Safety Checklist Details Patient Name: Date of Service: [name] [name] 09/30/2022 7:30 A M Medical Record Number: [id number] Patient Account Number: [id number] Date of Birth/Sex: Treating RN: 1965-07-02 (57 y.o. [name] [name] Primary Care : [name] [name] Other Clinician: Haywood [name] Referring : Treating /Extender: [name] [name], [name]: 11 HBO Safety Checklist Items Safety Checklist Consent Form Signed Patient voided / foley secured and emptied When did you last eato 0700 - PB Toast, Coffee Fruit Last dose of injectable or oral agent 0730 6 Units Humalog Ostomy pouch emptied and vented if applicable NA All implantable devices assessed, documented and approved Freestyle Libre 2 and3 on approved list Intravenous access site secured and place NA Valuables secured Linens and cotton and cotton/polyester blend (less than 51% polyester) Personal oil-based products / skin lotions / body lotions removed Wigs or hairpieces removed NA Smoking or tobacco materials removed NA Books / newspapers / magazines / loose paper removed Cologne, aftershave, perfume and deodorant removed Jewelry removed (may wrap wedding band) Make-up removed Hair care products removed Libre Freestyle 2 and 3 Sensor Approved, other Battery operated devices (external) removed electronics removed. Heating patches and chemical warmers removed Titanium eyewear removed Nail polish cured greater than 10 hours greater than 10 hours Casting material cured greater than 10 hours NA Hearing aids removed NA Loose dentures or partials removed NA Prosthetics have been removed NA Patient demonstrates correct use of air break device (if applicable) Patient concerns have been  addressed Patient grounding bracelet on and cord attached to chamber Specifics for Inpatients (complete in addition to above) Medication sheet sent with patient NA Intravenous medications needed or due during therapy sent with patient NA Drainage tubes (e.g. nasogastric tube or chest tube secured and vented) NA Endotracheal or Tracheotomy tube secured NA Cuff deflated of air and inflated with saline NA Airway suctioned NA Notes Paper version used prior to treatment start. Electronic Signature(s) Signed: 09/30/2022 9:42:45 AM By: [name] [name] CHT EMT BS , , Entered By: [name] [name] on 09/30/2022 09:42:45 [name] ([id number]) [id number] Page 3 of 3

## 2022-10-03 ENCOUNTER — Encounter (HOSPITAL_BASED_OUTPATIENT_CLINIC_OR_DEPARTMENT_OTHER): Payer: BC Managed Care – PPO | Admitting: Internal Medicine

## 2022-10-03 DIAGNOSIS — M86671 Other chronic osteomyelitis, right ankle and foot: Secondary | ICD-10-CM

## 2022-10-03 DIAGNOSIS — E10621 Type 1 diabetes mellitus with foot ulcer: Secondary | ICD-10-CM | POA: Diagnosis not present

## 2022-10-03 DIAGNOSIS — L97514 Non-pressure chronic ulcer of other part of right foot with necrosis of bone: Secondary | ICD-10-CM | POA: Diagnosis not present

## 2022-10-03 LAB — GLUCOSE, CAPILLARY
Glucose-Capillary: 218 mg/dL — ABNORMAL HIGH (ref 70–99)
Glucose-Capillary: 168 mg/dL — ABNORMAL HIGH (ref 70–99)

## 2022-10-03 NOTE — Progress Notes (Signed)
SURVEEN, MONSIVAIS ([id number]) [id number] Page 1 of 1 Visit Report for 09/30/2022 SuperBill Details Patient Name: Date of Service: [name] [name], [name] [name] 09/30/2022 Medical Record Number: [id number] Patient Account Number: [id number] Date of Birth/Sex: Treating RN: 04/15/1965 (57 y.o. 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 Other chronic osteomyelitis, right ankle and foot E10.621 Type 1 diabetes mellitus with foot ulcer Facility Procedures CPT4 Code Description Modifier Quantity [id number] G0277-(Facility Use Only) HBOT full body chamber, , 4 ICD-10 Diagnosis Description E10.621 Type 1 diabetes mellitus with foot ulcer L97.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

## 2022-10-03 NOTE — Progress Notes (Signed)
KAILANY, WACHTEL ([id number]) [id number] Page 1 of 1 Visit Report for 10/03/2022 SuperBill Details Patient Name: Date of Service: [name] [name], [name] [name] 10/03/2022 Medical Record Number: [id number] Patient Account Number: [id number] Date of Birth/Sex: Treating RN: 12-25-1965 (57 y.o. 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 Other chronic osteomyelitis, right ankle and foot E10.621 Type 1 diabetes mellitus with foot ulcer Facility Procedures CPT4 Code Description Modifier Quantity [id number] G0277-(Facility Use Only) HBOT full body chamber, , 4 ICD-10 Diagnosis Description E10.621 Type 1 diabetes mellitus with foot ulcer L97.514 Non-pressure chronic ulcer of other part of right foot with necrosis of bone M86.671 Other chronic osteomyelitis, right ankle and foot Physician Procedures Quantity CPT4 Code Description Modifier [id number] 99183 - WC PHYS HYPERBARIC OXYGEN THERAPY 1 ICD-10 Diagnosis Description E10.621 Type 1 diabetes mellitus with foot ulcer L97.514 Non-pressure chronic ulcer of other part of right foot with necrosis of bone M86.671 Other chronic osteomyelitis, right ankle and foot Electronic Signature(s) Signed: 10/03/2022 10:59:52 AM By: Haywood Pao CHT EMT BS , , Signed: 10/03/2022 4:29:36 PM By: [name] DO Entered By: Haywood Pao on 10/03/2022 10:59:52

## 2022-10-03 NOTE — Progress Notes (Addendum)
[name] [name] ([id number]) [id number] Page 1 of 2 Visit Report for 10/03/2022 HBO Details Patient Name: Date of Service: [name] [name], [name] [name] 10/03/2022 7:30 A M Medical Record Number: [id number] Patient Account Number: [id number] Date of Birth/Sex: Treating RN: 1965-05-25 (57 y.o. [name], [name] Primary Care : [name] Other Clinician: Haywood Pao Referring : Treating /Extender: [name], [name]: 11 HBO Treatment Course Details Treatment Course Number: 2 Ordering : [name] T Treatments Ordered: otal 60 HBO Treatment Start Date: 07/18/2022 HBO Indication: Diabetic Ulcer(s) of the Lower Extremity Standard/Conservative Wound Care tried and failed greater than or equal to 30 days HBO Treatment Details Treatment Number: 52 Patient Type: Outpatient Chamber Type: Monoplace Chamber Serial #: [id number] Treatment Protocol: 2.0 ATA with 90 minutes oxygen, with two 5 minute air breaks Treatment Details Compression Rate Down: 1.5 psi / minute De-Compression Rate Up: 2.0 psi / minute A breaks and breathing ir Compress Tx Pressure periods Decompress Decompress Begins Reached (leave unused spaces Begins Ends blank) Chamber Pressure (ATA 1 2 2 2 2 2  --2 1 ) Clock Time (24 hr) 08:06 08:18 08:48 08:53 09:23 09:28 - - 09:58 10:07 Treatment Length: 121 (minutes) Treatment Segments: 4 Vital Signs Capillary Blood Glucose Reference Range: 80 - 120 mg / dl HBO Diabetic Blood Glucose Intervention Range: <131 mg/dl or >259 mg/dl Type: Time Vitals Blood Respiratory Capillary Blood Glucose Pulse Action Pulse: Temperature: Taken: Pressure: Rate: Glucose (mg/dl): Meter #: Oximetry (%) Taken: Pre 07:42 106/70 75 18 97.4 218 none per protocol Post 10:12 145/79 74 18 97.2 168 none per protocol Treatment Response Treatment Toleration: Well Treatment Completion Status: Treatment Completed  without Adverse Event Treatment Notes Mrs. Westbrook arrived with vital signs within normal range. She stated that she had breakfast at 0630 with peanut butter toast and banana with coffee. She prepared for treatment. She was sent into the chamber with 8 oz orange juice for safety/intervention. After performing a safety check she was placed in the chamber which was compressed at a rate of 2 psi/min after confirming normal ear equalization. She tolerated the treatment and subsequent decompression at the rate of 2 psi/min. She stated that she did drink some of the orange juice she had (approximately 2 oz). Her post treatment vital signs were within normal range with blood glucose level of 168 mg/dL. She denied any issues with ear equalization and/or pain associated with barotrauma. She was stable upon discharge with her husband driving. Additional Procedure Documentation Tissue Sevierity: Necrosis of bone Physician HBO Attestation: I certify that I supervised this HBO treatment in accordance with Medicare guidelines. A trained emergency response team is readily available per Yes hospital policies and procedures. Continue HBOT as ordered. Yes Electronic Signature(s) Signed: 10/03/2022 4:29:36 PM By: Audley Hose, Signed: 10/03/2022 4:29:36 PM By: Sinda Du D ([id number]) [id number] Page 2 of 2 Previous Signature: 10/03/2022 12:23:26 PM Version By: Haywood Pao CHT EMT BS , , Previous Signature: 10/03/2022 10:59:29 AM Version By: Haywood Pao CHT EMT BS , , Previous Signature: 10/03/2022 10:07:45 AM Version By: Haywood Pao CHT EMT BS , , Previous Signature: 10/03/2022 9:55:18 AM Version By: Haywood Pao CHT EMT BS , , Entered By: [name] on 10/03/2022 15:28:40 -------------------------------------------------------------------------------- HBO Safety Checklist Details Patient Name: Date of Service: [name] Milo D.  10/03/2022 7:30 A M Medical Record Number: [id number] Patient Account Number: [id number] Date of Birth/Sex: Treating RN: 1965-09-14 (57 y.o. [name] [name] Primary Care :  Sherilyn Banker GESH Other Clinician: Haywood Pao Referring : Treating /Extender: [name], [name]: 11 HBO Safety Checklist Items Safety Checklist Consent Form Signed Patient voided / foley secured and emptied When did you last eato 0630 PB toast and banana, coffee Last dose of injectable or oral agent 0700 5 units humalog Ostomy pouch emptied and vented if applicable NA All implantable devices assessed, documented and approved Freestyle Libre 2 and3 on approved list Intravenous access site secured and place NA Valuables secured Linens and cotton and cotton/polyester blend (less than 51% polyester) Personal oil-based products / skin lotions / body lotions removed Wigs or hairpieces removed NA Smoking or tobacco materials removed NA Books / newspapers / magazines / loose paper removed Cologne, aftershave, perfume and deodorant removed Jewelry removed (may wrap wedding band) Make-up removed Hair care products removed Libre Freestyle 2 and 3 Sensor Approved, other Battery operated devices (external) removed electronics removed. Heating patches and chemical warmers removed Titanium eyewear removed Nail polish cured greater than 10 hours NA Casting material cured greater than 10 hours NA Hearing aids removed NA Loose dentures or partials removed NA Prosthetics have been removed NA Patient demonstrates correct use of air break device (if applicable) Patient concerns have been addressed Patient grounding bracelet on and cord attached to chamber Specifics for Inpatients (complete in addition to above) Medication sheet sent with patient NA Intravenous medications needed or due during therapy sent with patient NA Drainage tubes (e.g.  nasogastric tube or chest tube secured and vented) NA Endotracheal or Tracheotomy tube secured NA Cuff deflated of air and inflated with saline NA Airway suctioned NA Notes Paper version used prior to treatment start. Electronic Signature(s) Signed: 10/03/2022 12:21:56 PM By: Haywood Pao CHT EMT BS , , Previous Signature: 10/03/2022 9:29:12 AM Version By: Haywood Pao CHT EMT BS , , Entered By: Haywood Pao on 10/03/2022 12:21:56

## 2022-10-03 NOTE — Progress Notes (Addendum)
MCKINZE, PENUNURI ([id number]) [id number] Page 1 of 2 Visit Report for 10/03/2022 Arrival Information Details Patient Name: Date of Service: [name] [name], [name] [name] 10/03/2022 7:30 A M Medical Record Number: [id number] Patient Account Number: [id number] Date of Birth/Sex: Treating RN: 1965-03-24 (57 y.o. [name] [name], [name] Primary Care : [name] Other Clinician: Haywood Pao Referring : Treating /Extender: [name], [name]: 11 Visit Information History Since Last Visit All ordered tests and consults were completed: Yes Patient Arrived: Ambulatory Added or deleted any medications: No Arrival Time: 07:33 Any new allergies or adverse reactions: No Accompanied By: spouse Had a fall or experienced change in No Transfer Assistance: None activities of daily living that may affect Patient Identification Verified: Yes risk of falls: Secondary Verification Process Completed: Yes Signs or symptoms of abuse/neglect since last visito No Patient Requires Transmission-Based Precautions: No Hospitalized since last visit: No Patient Has Alerts: No Implantable device outside of the clinic excluding No cellular tissue based products placed in the center since last visit: Pain Present Now: No Electronic Signature(s) Signed: 10/03/2022 9:20:30 AM By: Haywood Pao CHT EMT BS , , Entered By: Haywood Pao on 10/03/2022 09:20:30 -------------------------------------------------------------------------------- Encounter Discharge Information Details Patient Name: Date of Service: [name] Milo D. 10/03/2022 7:30 A M Medical Record Number: [id number] Patient Account Number: [id number] Date of Birth/Sex: Treating RN: [birth year] (57 y.o. [name] [name] Primary Care : [name] Other Clinician: Haywood Pao Referring : Treating /Extender: [name], YO  GESH Weeks in Treatment: 11 Encounter Discharge Information Items Discharge Condition: Stable Ambulatory Status: Wheelchair Discharge Destination: Home Transportation: Private Auto Accompanied By: spouse Schedule Follow-up Appointment: No Clinical Summary of Care: Electronic Signature(s) Signed: 10/03/2022 11:00:24 AM By: Haywood Pao CHT EMT BS , , Entered By: Haywood Pao on 10/03/2022 11:00:23 [name] [name] ([id number]) [id number] Page 2 of 2 -------------------------------------------------------------------------------- Vitals Details Patient Name: Date of Service: [name] [name], [name] [name] 10/03/2022 7:30 A M Medical Record Number: [id number] Patient Account Number: [id number] Date of Birth/Sex: Treating RN: 1965/11/21 (57 y.o. [name] [name], [name] Primary Care : [name] Other Clinician: Haywood Pao Referring : Treating /Extender: [name], [name]: 11 Vital Signs Time Taken: 07:42 Temperature (F): 97.4 Height (in): 65 Pulse (bpm): 75 Weight (lbs): 105 Respiratory Rate (breaths/min): 18 Body Mass Index (BMI): 17.5 Blood Pressure (mmHg): 106/70 Capillary Blood Glucose (mg/dl): 951 Reference Range: 80 - 120 mg / dl Electronic Signature(s) Signed: 10/03/2022 9:21:29 AM By: Haywood Pao CHT EMT BS , , Entered By: Haywood Pao on 10/03/2022 09:21:29

## 2022-10-04 ENCOUNTER — Encounter (HOSPITAL_BASED_OUTPATIENT_CLINIC_OR_DEPARTMENT_OTHER): Payer: BC Managed Care – PPO | Admitting: Internal Medicine

## 2022-10-04 DIAGNOSIS — M86671 Other chronic osteomyelitis, right ankle and foot: Secondary | ICD-10-CM | POA: Diagnosis not present

## 2022-10-04 DIAGNOSIS — L97514 Non-pressure chronic ulcer of other part of right foot with necrosis of bone: Secondary | ICD-10-CM | POA: Diagnosis not present

## 2022-10-04 DIAGNOSIS — E10621 Type 1 diabetes mellitus with foot ulcer: Secondary | ICD-10-CM | POA: Diagnosis not present

## 2022-10-04 LAB — GLUCOSE, CAPILLARY
Glucose-Capillary: 314 mg/dL — ABNORMAL HIGH (ref 70–99)
Glucose-Capillary: 157 mg/dL — ABNORMAL HIGH (ref 70–99)

## 2022-10-04 NOTE — Progress Notes (Addendum)
[name] [name], [name] [name] ([id number]) [id number] Page 1 of 8 Visit Report for 10/04/2022 Arrival Information Details Patient Name: Date of Service: [name] [name], [name] [name] 10/04/2022 12:30 PM Medical Record Number: [id number] Patient Account Number: [id number] Date of Birth/Sex: Treating RN: 07/21/1965 (57 y.o. Female) Brenton Grills Primary Care Duncan Alejandro: [name] Other Clinician: Referring Lorrinda Ramstad: Treating Victoriah Wilds/Extender: [name], [name]: 12 Visit Information History Since Last Visit All ordered tests and consults were completed: Yes Patient Arrived: Wheel Chair Added or deleted any medications: No Arrival Time: 12:53 Any new allergies or adverse reactions: No Accompanied By: self Had a fall or experienced change in No Transfer Assistance: None activities of daily living that may affect Patient Identification Verified: Yes risk of falls: Secondary Verification Process Completed: Yes Signs or symptoms of abuse/neglect since last visito No Patient Requires Transmission-Based Precautions: No Hospitalized since last visit: No Patient Has Alerts: No Implantable device outside of the clinic excluding No cellular tissue based products placed in the center since last visit: Has Dressing in Place as Prescribed: Yes Pain Present Now: No Electronic Signature(s) Signed: 10/25/2022 2:03:55 PM By: Brenton Grills Entered By: Brenton Grills on 10/04/2022 12:53:58 -------------------------------------------------------------------------------- Encounter Discharge Information Details Patient Name: Date of Service: [name] Milo [name]. 10/04/2022 12:30 PM Medical Record Number: [id number] Patient Account Number: [id number] Date of Birth/Sex: Treating RN: [birth year] (57 y.o. Female) Brenton Grills Primary Care Melanie Pellot: [name] Other Clinician: Referring Ravleen Ries: Treating Kaelani Kendrick/Extender: [name], YO  GESH Weeks in Treatment: 12 Encounter Discharge Information Items Post Procedure Vitals Discharge Condition: Stable Temperature (F): 97.8 Ambulatory Status: Wheelchair Pulse (bpm): 68 Discharge Destination: Home Respiratory Rate (breaths/min): 18 Transportation: Private Auto Blood Pressure (mmHg): 128/72 Accompanied By: self Schedule Follow-up Appointment: Yes Clinical Summary of Care: Patient Declined Electronic Signature(s) Signed: 10/25/2022 2:03:55 PM By: Brenton Grills Entered By: Brenton Grills on 10/04/2022 13:36:34 [name] [name] ([id number]) [id number] Page 2 of 8 -------------------------------------------------------------------------------- Lower Extremity Assessment Details Patient Name: Date of Service: [name] [name], [name] [name] 10/04/2022 12:30 PM Medical Record Number: [id number] Patient Account Number: [id number] Date of Birth/Sex: Treating RN: October 22, 1965 (57 y.o. Female) Brenton Grills Primary Care Gared Gillie: [name] Other Clinician: Referring Jenica Costilow: Treating Bandon Sherwin/Extender: [name], [name]: 12 Edema Assessment Assessed: [Left: No] [Right: Yes] Edema: [Left: N] [Right: o] Calf Left: Right: Point of Measurement: From Medial Instep 26.2 cm Ankle Left: Right: Point of Measurement: From Medial Instep 19 cm Vascular Assessment Pulses: Dorsalis Pedis Palpable: [Right:Yes] Extremity colors, hair growth, and conditions: Extremity Color: [Right:Normal] Hair Growth on Extremity: [Right:No] Temperature of Extremity: [Right:Warm] Capillary Refill: [Right:< 3 seconds] Dependent Rubor: [Right:No No] Toe Nail Assessment Left: Right: Thick: No Discolored: No Deformed: No Improper Length and Hygiene: No Electronic Signature(s) Signed: 10/25/2022 2:03:55 PM By: Brenton Grills Entered By: Brenton Grills on 10/04/2022  13:00:55 -------------------------------------------------------------------------------- Multi Wound Chart Details Patient Name: Date of Service: [name] Milo [name]. 10/04/2022 12:30 PM Medical Record Number: [id number] Patient Account Number: [id number] Date of Birth/Sex: Treating RN: 28-Jun-1965 (57 y.o. Female) Primary Care Kerin Kren: [name] Other Clinician: Referring Kameo Bains: Treating Almeter Westhoff/Extender: [name], [name]: 12 Vital Signs Height(in): 65 Capillary Blood Glucose(mg/dl): 323 Weight(lbs): 557 Pulse(bpm): 73 Body Mass Index(BMI): 17.5 Blood Pressure(mmHg): 163/88 [name] [name], [name] [name] ([id number]) [id number] Page 3 of 8 Temperature(F): 97.8 Respiratory Rate(breaths/min): 18 [4:Photos:] [N/A:N/A] Right Calcaneus N/A N/A Wound Location: Gradually Appeared N/A N/A Wounding Event: Diabetic Wound/Ulcer of  the Lower N/A N/A Primary Etiology: Extremity Hypertension, Type I Diabetes, N/A N/A Comorbid History: Osteoarthritis, Osteomyelitis, Neuropathy 05/30/2022 N/A N/A Date Acquired: 12 N/A N/A Weeks of Treatment: Open N/A N/A Wound Status: No N/A N/A Wound Recurrence: 0.3x0.3x0.1 N/A N/A Measurements L x W x [name] (cm) 0.071 N/A N/A A (cm) : rea 0.007 N/A N/A Volume (cm) : 99.20% N/A N/A % Reduction in A rea: 99.70% N/A N/A % Reduction in Volume: Grade 2 N/A N/A Classification: Medium N/A N/A Exudate A mount: Serosanguineous N/A N/A Exudate Type: red, brown N/A N/A Exudate Color: Distinct, outline attached N/A N/A Wound Margin: Large (67-100%) N/A N/A Granulation A mount: Red N/A N/A Granulation Quality: None Present (0%) N/A N/A Necrotic A mount: Fat Layer (Subcutaneous Tissue): Yes N/A N/A Exposed Structures: Fascia: No Tendon: No Muscle: No Joint: No Bone: No Medium (34-66%) N/A N/A Epithelialization: Debridement - Selective/Open Wound N/A N/A Debridement: Pre-procedure  Verification/Time Out 13:25 N/A N/A Taken: Lidocaine 4% Topical Solution N/A N/A Pain Control: Slough N/A N/A Tissue Debrided: Non-Viable Tissue N/A N/A Level: 0.07 N/A N/A Debridement A (sq cm): rea Curette N/A N/A Instrument: Minimum N/A N/A Bleeding: Pressure N/A N/A Hemostasis A chieved: 0 N/A N/A Procedural Pain: 0 N/A N/A Post Procedural Pain: Procedure was tolerated well N/A N/A Debridement Treatment Response: 0.3x0.3x0.1 N/A N/A Post Debridement Measurements L x W x [name] (cm) 0.007 N/A N/A Post Debridement Volume: (cm) No Abnormalities Noted N/A N/A Periwound Skin Texture: Maceration: Yes N/A N/A Periwound Skin Moisture: No Abnormalities Noted N/A N/A Periwound Skin Color: Hot N/A N/A Temperature: Cellular or Tissue Based Product N/A N/A Procedures Performed: Debridement Treatment Notes Wound #4 (Calcaneus) Wound Laterality: Right Cleanser Peri-Wound Care Topical Primary Dressing Secondary Dressing ADAPTIC TOUCH 3x4.25 in Discharge Instruction: Apply over primary dressing as directed. DARRYLIN, DEVALK ([id number]) [id number] Page 4 of 8 Zetuvit Plus Silicone Border Dressing 5x5 (in/in) Discharge Instruction: Apply silicone border over primary dressing as directed. Secured With Compression Wrap Compression Stockings Add-Ons Electronic Signature(s) Signed: 10/04/2022 5:13:05 PM By: [name] DO Entered By: [name] on 10/04/2022 13:42:25 -------------------------------------------------------------------------------- Multi-Disciplinary Care Plan Details Patient Name: Date of Service: [name] [name], [name] [name] 10/04/2022 12:30 PM Medical Record Number: [id number] Patient Account Number: [id number] Date of Birth/Sex: Treating RN: [birth year] (57 y.o. Female) Brenton Grills Primary Care Allard Lightsey: [name] Other Clinician: Referring Jaritza Duignan: Treating Tracy Gerken/Extender: [name], YO GESH Weeks in  Treatment: 12 Active Inactive HBO Nursing Diagnoses: Anxiety related to feelings of confinement associated with the hyperbaric oxygen chamber Anxiety related to knowledge deficit of hyperbaric oxygen therapy and treatment procedures Potential for barotraumas to ears, sinuses, teeth, and lungs or cerebral gas embolism related to changes in atmospheric pressure inside hyperbaric oxygen chamber Potential for oxygen toxicity seizures related to delivery of 100% oxygen at an increased atmospheric pressure Potential for pulmonary oxygen toxicity related to delivery of 100% oxygen at an increased atmospheric pressure Goals: Patient and/or family will be able to state/discuss factors appropriate to the management of their disease process during treatment Date Initiated: 07/12/2022 Target Resolution Date: 09/30/2022 Goal Status: Active Patient will tolerate the hyperbaric oxygen therapy treatment Date Initiated: 07/12/2022 Target Resolution Date: 09/23/2022 Goal Status: Active Patient/caregiver will verbalize understanding of HBO goals, rationale, procedures and potential hazards Date Initiated: 07/12/2022 Date Inactivated: 08/12/2022 Target Resolution Date: 08/09/2022 Goal Status: Met Interventions: Administer a five (5) minute air break for patient if signs and symptoms of seizure appear and notify the hyperbaric physician Administer a ten (  10) minute air break for patient if signs and symptoms of seizure appear and notify the hyperbaric physician Administer decongestants, per physician orders, prior to HBO2 Administer the correct therapeutic gas delivery based on the patients needs and limitations, per physician order Assess and provide for patients comfort related to the hyperbaric environment and equalization of middle ear Assess for signs and symptoms related to adverse events, including but not limited to confinement anxiety, pneumothorax, oxygen toxicity and baurotrauma Assess patient for any  history of confinement anxiety Assess patient's knowledge and expectations regarding hyperbaric medicine and provide education related to the hyperbaric environment, goals of treatment and prevention of adverse events Implement protocols to decrease risk of pneumothorax in high risk patients Notes: Nutrition Nursing Diagnoses: [name] [name], [name] [name] ([id number]) [id number] Page 5 of 8 Imbalanced nutrition Impaired glucose control: actual or potential Goals: Patient/caregiver verbalizes understanding of need to maintain therapeutic glucose control per primary care physician Date Initiated: 07/12/2022 Target Resolution Date: 08/22/2022 Goal Status: Active Patient/caregiver will maintain therapeutic glucose control Date Initiated: 07/12/2022 Target Resolution Date: 09/23/2022 Goal Status: Active Interventions: Assess HgA1c results as ordered upon admission and as needed Assess patient nutrition upon admission and as needed per policy Provide education on elevated blood sugars and impact on wound healing Provide education on nutrition Treatment Activities: Education provided on Nutrition : 07/12/2022 Notes: Osteomyelitis Nursing Diagnoses: Infection: osteomyelitis Knowledge deficit related to disease process and management Goals: Patient/caregiver will verbalize understanding of disease process and disease management Date Initiated: 07/12/2022 Date Inactivated: 08/12/2022 Target Resolution Date: 08/18/2022 Goal Status: Met Patient's osteomyelitis will resolve Date Initiated: 07/12/2022 Target Resolution Date: 10/28/2022 Goal Status: Active Signs and symptoms for osteomyelitis will be recognized and promptly addressed Date Initiated: 07/12/2022 Target Resolution Date: 09/30/2022 Goal Status: Active Interventions: Assess for signs and symptoms of osteomyelitis resolution every visit Provide education on osteomyelitis Notes: Electronic Signature(s) Signed: 10/25/2022  2:03:55 PM By: Brenton Grills Entered By: Brenton Grills on 10/04/2022 13:07:30 -------------------------------------------------------------------------------- Pain Assessment Details Patient Name: Date of Service: [name] [name], [name] [name] 10/04/2022 12:30 PM Medical Record Number: [id number] Patient Account Number: [id number] Date of Birth/Sex: Treating RN: 14-Jan-1966 (58 y.o. Female) Brenton Grills Primary Care Dorice Stiggers: [name] Other Clinician: Referring Babe Clenney: Treating Andrae Claunch/Extender: [name], [name]: 12 Active Problems Location of Pain Severity and Description of Pain Patient Has Paino No Site Locations [place], [place] [name] ([id number]) [phone].pdf Page 6 of 8 Pain Management and Medication Current Pain Management: Electronic Signature(s) Signed: 10/25/2022 2:03:55 PM By: Brenton Grills Entered By: Brenton Grills on 10/04/2022 12:57:20 -------------------------------------------------------------------------------- Patient/Caregiver Education Details Patient Name: Date of Service: [name] [name] 8/6/2024andnbsp12:30 PM Medical Record Number: [id number] Patient Account Number: [id number] Date of Birth/Gender: Treating RN: 1965/10/21 (57 y.o. Female) Brenton Grills Primary Care Physician: [name] Other Clinician: Referring Physician: Treating Physician/Extender: Yates Decamp Weeks in Treatment: 12 Education Assessment Education Provided To: Patient Education Topics Provided Wound/Skin Impairment: Methods: Explain/Verbal Responses: State content correctly Electronic Signature(s) Signed: 10/25/2022 2:03:55 PM By: Brenton Grills Entered By: Brenton Grills on 10/04/2022 13:07:47 -------------------------------------------------------------------------------- Wound Assessment Details Patient Name: Date of Service: [name] [name], [name] [name] 10/04/2022 12:30 PM Medical Record Number:  [id number] Patient Account Number: [id number] Date of Birth/Sex: Treating RN: August 12, 1965 (57 y.o. Female) Brenton Grills Primary Care Nathanyel Defenbaugh: [name] Other Clinician: Referring [name] [name]: Treating Aydon Swamy/Extender: Yates Decamp [place], [name] [name] ([id number]) [id number] Page 7 of 8 Weeks in Treatment: 12 Wound Status Wound Number:  4 Primary Diabetic Wound/Ulcer of the Lower Extremity Etiology: Wound Location: Right Calcaneus Wound Status: Open Wounding Event: Gradually Appeared Comorbid Hypertension, Type I Diabetes, Osteoarthritis, Osteomyelitis, Date Acquired: 05/30/2022 History: Neuropathy Weeks Of Treatment: 12 Clustered Wound: No Photos Wound Measurements Length: (cm) 0.3 Width: (cm) 0.3 Depth: (cm) 0.1 Area: (cm) 0.071 Volume: (cm) 0.007 % Reduction in Area: 99.2% % Reduction in Volume: 99.7% Epithelialization: Medium (34-66%) Tunneling: No Undermining: No Wound Description Classification: Grade 2 Wound Margin: Distinct, outline attached Exudate Amount: Medium Exudate Type: Serosanguineous Exudate Color: red, brown Foul Odor After Cleansing: No Slough/Fibrino No Wound Bed Granulation Amount: Large (67-100%) Exposed Structure Granulation Quality: Red Fascia Exposed: No Necrotic Amount: None Present (0%) Fat Layer (Subcutaneous Tissue) Exposed: Yes Tendon Exposed: No Muscle Exposed: No Joint Exposed: No Bone Exposed: No Periwound Skin Texture Texture Color No Abnormalities Noted: Yes No Abnormalities Noted: Yes Moisture Temperature / Pain No Abnormalities Noted: No Temperature: Hot Maceration: Yes Electronic Signature(s) Signed: 10/25/2022 2:03:55 PM By: Brenton Grills Entered By: Brenton Grills on 10/04/2022 13:03:45 -------------------------------------------------------------------------------- Vitals Details Patient Name: Date of Service: [name] Milo [name]. 10/04/2022 12:30 PM Medical Record  Number: [id number] Patient Account Number: [id number] Date of Birth/Sex: Treating RN: June 19, 1965 (57 y.o. Female) Brenton Grills Primary Care Marelly Wehrman: [name] Other Clinician: Haywood Pao Referring Joyclyn Plazola: Treating Neyah Ellerman/Extender: Yates Decamp [place], [name] [name] ([id number]) [id number] Page 8 of 8 Weeks in Treatment: 12 Vital Signs Time Taken: 12:39 Temperature (F): 97.8 Height (in): 65 Pulse (bpm): 73 Weight (lbs): 105 Respiratory Rate (breaths/min): 18 Body Mass Index (BMI): 17.5 Blood Pressure (mmHg): 163/88 Capillary Blood Glucose (mg/dl): 782 Reference Range: 80 - 120 mg / dl Electronic Signature(s) Signed: 10/04/2022 1:09:46 PM By: Haywood Pao CHT EMT BS , , Previous Signature: 10/04/2022 12:50:35 PM Version By: Haywood Pao CHT EMT BS , , Entered By: Haywood Pao on 10/04/2022 13:09:46

## 2022-10-04 NOTE — Progress Notes (Signed)
DEOVION, TROSS ([id number]) [id number] Page 1 of 1 Visit Report for 10/04/2022 SuperBill Details Patient Name: Date of Service: [name] [name], [name] [name] 10/04/2022 Medical Record Number: [id number] Patient Account Number: [id number] Date of Birth/Sex: Treating RN: 19-Apr-1965 (57 y.o. 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 osteomyelitis, right ankle and foot E10.621 Type 1 diabetes mellitus with foot ulcer Facility Procedures CPT4 Code Description Modifier Quantity [id number] G0277-(Facility Use Only) HBOT full body chamber, , 4 ICD-10 Diagnosis Description E10.621 Type 1 diabetes mellitus with foot ulcer L97.514 Non-pressure chronic ulcer of other part of right foot with necrosis of bone M86.671 Other chronic osteomyelitis, right ankle and foot Physician Procedures Quantity CPT4 Code Description Modifier [id number] - WC PHYS HYPERBARIC OXYGEN THERAPY 1 ICD-10 Diagnosis Description E10.621 Type 1 diabetes mellitus with foot ulcer L97.514 Non-pressure chronic ulcer of other part of right foot with necrosis of bone M86.671 Other chronic osteomyelitis, right ankle and foot Electronic Signature(s) Signed: 10/04/2022 1:32:05 PM By: Haywood Pao CHT EMT BS , , Signed: 10/04/2022 5:13:05 PM By: [name] DO Entered By: Haywood Pao on 10/04/2022 13:32:04

## 2022-10-04 NOTE — Progress Notes (Addendum)
[name], [name] ([id number]) [id number] Page 1 of 2 Visit Report for 10/04/2022 HBO Details Patient Name: Date of Service: [name] [name], [name] [name] 10/04/2022 10:00 A M Medical Record Number: [id number] Patient Account Number: [id number] Date of Birth/Sex: Treating RN: 1965/05/04 (57 y.o. [name] [name] Primary Care : [name] Other Clinician: Haywood Pao Referring : Treating /Extender: [name], [name]: 12 HBO Treatment Course Details Treatment Course Number: 2 Ordering : [name] T Treatments Ordered: otal 60 HBO Treatment Start Date: 07/18/2022 HBO Indication: Diabetic Ulcer(s) of the Lower Extremity Standard/Conservative Wound Care tried and failed greater than or equal to 30 days HBO Treatment Details Treatment Number: 53 Patient Type: Outpatient Chamber Type: Monoplace Chamber Serial #: [id number] Treatment Protocol: 2.0 ATA with 90 minutes oxygen, with two 5 minute air breaks Treatment Details Compression Rate Down: 1.5 psi / minute De-Compression Rate Up: 2.0 psi / minute A breaks and breathing ir Compress Tx Pressure periods Decompress Decompress Begins Reached (leave unused spaces Begins Ends blank) Chamber Pressure (ATA 1 2 2 2 2 2  --2 1 ) Clock Time (24 hr) 10:38 10:48 11:18 11:23 11:53 11:58 - - 12:28 12:36 Treatment Length: 118 (minutes) Treatment Segments: 4 Vital Signs Capillary Blood Glucose Reference Range: 80 - 120 mg / dl HBO Diabetic Blood Glucose Intervention Range: <131 mg/dl or >696 mg/dl Type: Time Vitals Blood Pulse: Respiratory Temperature: Capillary Blood Glucose Pulse Action Taken: Pressure: Rate: Glucose (mg/dl): Meter #: Oximetry (%) Taken: Pre 12:36 114/68 71 18 97.2 314 Asymptomatic for hyperglycemia, see note. Post 12:39 163/88 73 18 97.8 157 none per protocol Treatment Response Treatment Toleration: Well Treatment Completion  Status: Treatment Completed without Adverse Event Treatment Notes [name] arrived with vital signs within normal range except blood glucose level of 314 mg/dL. Technician Kennedy Bucker spoke to Dr. Mikey Bussing about patient's blood glucose level and communicated decision for clearance for treatment. Patient was given 4 oz Apple Juice to take with her for intervention/safety purposes. After performing a safety check, she was placed in the chamber which was compressed at a rate of 2 psi/min after confirmation of normal ear equalization. She tolerated the treatment and subsequent decompression of the chamber at a rate of 2 psi/min. She denied any issues with ear equalization and/or pain associated with barotrauma. Her post-treatment vital signs were within normal range She was stable upon discharge to wound clinic for wound care encounter. Additional Procedure Documentation Tissue Sevierity: Necrosis of bone Physician HBO Attestation: I certify that I supervised this HBO treatment in accordance with Medicare guidelines. A trained emergency response team is readily available per Yes hospital policies and procedures. Continue HBOT as ordered. Yes Electronic Signature(s) Signed: 10/04/2022 5:13:05 PM By: [name] DO Previous Signature: 10/04/2022 1:31:48 PM Version By: Haywood Pao CHT EMT BS , , [name] ([id number]) [id number] Page 2 of 2 Entered By: [name] on 10/04/2022 16:40:46 -------------------------------------------------------------------------------- HBO Safety Checklist Details Patient Name: Date of Service: [name], [name] 10/04/2022 10:00 A M Medical Record Number: [id number] Patient Account Number: [id number] Date of Birth/Sex: Treating RN: [birth year] (57 y.o. [name] [name] Primary Care : [name] Other Clinician: Haywood Pao Referring : Treating /Extender: [name], YO  GESH Weeks in Treatment: 12 HBO Safety Checklist Items Safety Checklist Consent Form Signed Patient voided / foley secured and emptied When did you last eato 0700 -Bagel, Peanut Butter, Fruit (coffee) Last dose of injectable or oral agent 0800 7 units  humalog Ostomy pouch emptied and vented if applicable NA All implantable devices assessed, documented and approved Freestyle Libre 2 and3 on approved list Intravenous access site secured and place NA Valuables secured Linens and cotton and cotton/polyester blend (less than 51% polyester) Personal oil-based products / skin lotions / body lotions removed Wigs or hairpieces removed NA Smoking or tobacco materials removed NA Books / newspapers / magazines / loose paper removed Cologne, aftershave, perfume and deodorant removed Jewelry removed (may wrap wedding band) Make-up removed Hair care products removed Battery operated devices (external) removed Heating patches and chemical warmers removed Titanium eyewear removed NA Nail polish cured greater than 10 hours NA Casting material cured greater than 10 hours NA Hearing aids removed NA Loose dentures or partials removed NA Prosthetics have been removed NA Patient demonstrates correct use of air break device (if applicable) Patient concerns have been addressed Patient grounding bracelet on and cord attached to chamber Specifics for Inpatients (complete in addition to above) Medication sheet sent with patient NA Intravenous medications needed or due during therapy sent with patient NA Drainage tubes (e.g. nasogastric tube or chest tube secured and vented) NA Endotracheal or Tracheotomy tube secured NA Cuff deflated of air and inflated with saline NA Airway suctioned NA Notes Paper version used prior to treatment start. Electronic Signature(s) Signed: 10/04/2022 1:23:29 PM By: Haywood Pao CHT EMT BS , , Entered By: Haywood Pao on 10/04/2022 13:23:29

## 2022-10-04 NOTE — Progress Notes (Signed)
ALEACIA, DIETZEN ([id number]) [id number] Page 1 of 2 Visit Report for 10/04/2022 Arrival Information Details Patient Name: Date of Service: KAYTIE, WIITALA 10/04/2022 10:00 A M Medical Record Number: [id number] Patient Account Number: [id number] Date of Birth/Sex: Treating RN: Mar 29, 1965 (57 y.o. [name] Primary Care : [name] Other Clinician: Karl Bales Referring : Treating /Extender: [name], [name]: 12 Visit Information History Since Last Visit All ordered tests and consults were completed: Yes Patient Arrived: Wheel Chair Added or deleted any medications: No Arrival Time: 09:30 Any new allergies or adverse reactions: No Accompanied By: driver Had a fall or experienced change in No Transfer Assistance: None activities of daily living that may affect Patient Identification Verified: Yes risk of falls: Secondary Verification Process Completed: Yes Signs or symptoms of abuse/neglect since last visito No Patient Requires Transmission-Based Precautions: No Hospitalized since last visit: No Patient Has Alerts: No Implantable device outside of the clinic excluding No cellular tissue based products placed in the center since last visit: Pain Present Now: No Electronic Signature(s) Signed: 10/04/2022 1:16:07 PM By: Haywood Pao CHT EMT BS , , Previous Signature: 10/04/2022 1:08:35 PM Version By: Haywood Pao CHT EMT BS , , Entered By: Haywood Pao on 10/04/2022 13:16:06 -------------------------------------------------------------------------------- Encounter Discharge Information Details Patient Name: Date of Service: [name] 10/04/2022 10:00 A M Medical Record Number: [id number] Patient Account Number: [id number] Date of Birth/Sex: Treating RN: 07-31-1965 (57 y.o. [name] Primary Care : [name] Other Clinician:  Haywood Pao Referring : Treating /Extender: Caleen Essex GESH Weeks in Treatment: 12 Encounter Discharge Information Items Discharge Condition: Stable Ambulatory Status: Wheelchair Discharge Destination: Other (Note Required) Transportation: Other Accompanied By: staff Schedule Follow-up Appointment: No Clinical Summary of Care: Notes Wound Care Encounter after treatment. Electronic Signature(s) Signed: 10/04/2022 1:33:10 PM By: Haywood Pao CHT EMT BS , , Entered By: Haywood Pao on 10/04/2022 13:33:10 [name] ([id number]) [id number] Page 2 of 2 -------------------------------------------------------------------------------- Vitals Details Patient Name: Date of Service: OONA, ONEEL 10/04/2022 10:00 A M Medical Record Number: [id number] Patient Account Number: [id number] Date of Birth/Sex: Treating RN: 05-19-1965 (57 y.o. [name] Primary Care : [name] Other Clinician: Karl Bales Referring : Treating /Extender: [name], [name]: 12 Vital Signs Time Taken: 12:36 Temperature (F): 97.2 Height (in): 65 Pulse (bpm): 71 Weight (lbs): 105 Respiratory Rate (breaths/min): 18 Body Mass Index (BMI): 17.5 Blood Pressure (mmHg): 114/68 Capillary Blood Glucose (mg/dl): 244 Reference Range: 80 - 120 mg / dl Electronic Signature(s) Signed: 10/04/2022 1:15:56 PM By: Haywood Pao CHT EMT BS , , Entered By: Haywood Pao on 10/04/2022 13:15:56

## 2022-10-05 ENCOUNTER — Encounter (HOSPITAL_BASED_OUTPATIENT_CLINIC_OR_DEPARTMENT_OTHER): Payer: BC Managed Care – PPO | Admitting: Physician Assistant

## 2022-10-05 DIAGNOSIS — E10621 Type 1 diabetes mellitus with foot ulcer: Secondary | ICD-10-CM | POA: Diagnosis not present

## 2022-10-05 LAB — GLUCOSE, CAPILLARY
Glucose-Capillary: 222 mg/dL — ABNORMAL HIGH (ref 70–99)
Glucose-Capillary: 209 mg/dL — ABNORMAL HIGH (ref 70–99)

## 2022-10-05 NOTE — Progress Notes (Addendum)
[name] [name] ([id number]) [id number] Page 1 of 2 Visit Report for 10/05/2022 Arrival Information Details Patient Name: Date of Service: [name] [name], [name] [name] 10/05/2022 10:00 A M Medical Record Number: [id number] Patient Account Number: [id number] Date of Birth/Sex: Treating RN: 09/06/1965 (57 y.o. [name] Primary Care : [name] Other Clinician: Karl Bales Referring : Treating /Extender: Alford Highland, [name]: 12 Visit Information History Since Last Visit All ordered tests and consults were completed: Yes Patient Arrived: Wheel Chair Added or deleted any medications: No Arrival Time: 09:29 Any new allergies or adverse reactions: No Accompanied By: driver Had a fall or experienced change in No Transfer Assistance: None activities of daily living that may affect Patient Identification Verified: Yes risk of falls: Secondary Verification Process Completed: Yes Signs or symptoms of abuse/neglect since last visito No Patient Requires Transmission-Based Precautions: No Hospitalized since last visit: No Patient Has Alerts: No Implantable device outside of the clinic excluding No cellular tissue based products placed in the center since last visit: Pain Present Now: No Electronic Signature(s) Signed: 10/05/2022 11:51:01 AM By: Haywood Pao CHT EMT BS , , Entered By: Haywood Pao on 10/05/2022 11:51:00 -------------------------------------------------------------------------------- Encounter Discharge Information Details Patient Name: Date of Service: [name] Milo D. 10/05/2022 10:00 A M Medical Record Number: [id number] Patient Account Number: [id number] Date of Birth/Sex: Treating RN: 1965-12-20 (57 y.o. [name] Primary Care : [name] Other Clinician: Haywood Pao Referring : Treating /Extender: Alford Highland, YO  GESH Weeks in Treatment: 12 Encounter Discharge Information Items Discharge Condition: Stable Ambulatory Status: Wheelchair Discharge Destination: Home Transportation: Private Auto Accompanied By: self Schedule Follow-up Appointment: No Clinical Summary of Care: Electronic Signature(s) Signed: 10/05/2022 2:06:32 PM By: Haywood Pao CHT EMT BS , , Entered By: Haywood Pao on 10/05/2022 14:06:31 [name] ([id number]) [id number].pdf Page 2 of 2 -------------------------------------------------------------------------------- Vitals Details Patient Name: Date of Service: [name] [name], [name] [name] 10/05/2022 10:00 A M Medical Record Number: [id number] Patient Account Number: [id number] Date of Birth/Sex: Treating RN: 02-Nov-1965 (57 y.o. [name] Primary Care : [name] Other Clinician: Karl Bales Referring : Treating /Extender: Alford Highland, [name]: 12 Vital Signs Time Taken: 09:38 Temperature (F): 97.5 Height (in): 65 Pulse (bpm): 75 Weight (lbs): 105 Respiratory Rate (breaths/min): 18 Body Mass Index (BMI): 17.5 Blood Pressure (mmHg): 153/82 Capillary Blood Glucose (mg/dl): 315 Reference Range: 80 - 120 mg / dl Electronic Signature(s) Signed: 10/05/2022 11:51:29 AM By: Haywood Pao CHT EMT BS , , Entered By: Haywood Pao on 10/05/2022 11:51:29

## 2022-10-05 NOTE — Progress Notes (Signed)
[name] [name] ([id number]) [id number] Page 1 of 2 Visit Report for 10/05/2022 HBO Details Patient Name: Date of Service: [name] [name], [name] [name] 10/05/2022 10:00 A M Medical Record Number: [id number] Patient Account Number: [id number] Date of Birth/Sex: Treating RN: Jan 06, 1966 (57 y.o. [name], [name] Primary Care : [name] Other Clinician: Haywood Pao Referring : Treating /Extender: Alford Highland, [name]: 12 HBO Treatment Course Details Treatment Course Number: 2 Ordering : [name] T Treatments Ordered: otal 60 HBO Treatment Start Date: 07/18/2022 HBO Indication: Diabetic Ulcer(s) of the Lower Extremity Standard/Conservative Wound Care tried and failed greater than or equal to 30 days HBO Treatment Details Treatment Number: 54 Patient Type: Outpatient Chamber Type: Monoplace Chamber Serial #: [id number] Treatment Protocol: 2.0 ATA with 90 minutes oxygen, with two 5 minute air breaks Treatment Details Compression Rate Down: 1.5 psi / minute De-Compression Rate Up: 2.0 psi / minute A breaks and breathing ir Compress Tx Pressure periods Decompress Decompress Begins Reached (leave unused spaces Begins Ends blank) Chamber Pressure (ATA 1 2 2 2 2 2  --2 1 ) Clock Time (24 hr) 10:15 10:26 10:56 11:01 11:31 11:36 - - 12:06 12:20 Treatment Length: 125 (minutes) Treatment Segments: 4 Vital Signs Capillary Blood Glucose Reference Range: 80 - 120 mg / dl HBO Diabetic Blood Glucose Intervention Range: <131 mg/dl or >696 mg/dl Type: Time Vitals Blood Respiratory Capillary Blood Glucose Pulse Action Pulse: Temperature: Taken: Pressure: Rate: Glucose (mg/dl): Meter #: Oximetry (%) Taken: Pre 09:38 153/82 75 18 97.5 222 none per protocol Post 12:23 158/101 81 18 97.9 209 manual bp taken Post 12:30 160/80 manual BP Treatment Response Treatment Toleration: Well Treatment Completion  Status: Treatment Completed without Adverse Event Treatment Notes Mrs. Palm arrived with normal vital signs and prepared for treatment. She stated that she ate peanut butter toast today with coffee. She was After performing a safety check, she was placed in the chamber which was compressed at a rate of 2 psi/min after confirmation of normal ear equalization. She tolerated the treatment and subsequent decompression of the chamber at a rate of 2 psi/min. Her post-treatment vital signs were normal except blood pressure of 158/101 mmHg. Patient prepared for departure. Prior to leaving her BP was retaken manually with result of 160/80 mmHg. She was stable upon discharge with her driver. Additional Procedure Documentation Tissue Sevierity: Necrosis of bone Electronic Signature(s) Signed: 10/05/2022 12:52:14 PM By: Haywood Pao CHT EMT BS , , Signed: 10/05/2022 7:55:21 PM By: Allen Derry PA-C Previous Signature: 10/05/2022 12:12:26 PM Version By: Haywood Pao CHT EMT BS , , Entered By: Haywood Pao on 10/05/2022 12:52:14 [name] ([id number]) [id number] Page 2 of 2 -------------------------------------------------------------------------------- HBO Safety Checklist Details Patient Name: Date of Service: [name] [name] 10/05/2022 10:00 A M Medical Record Number: [id number] Patient Account Number: [id number] Date of Birth/Sex: Treating RN: 1965-10-31 (57 y.o. [name] Primary Care : [name] Other Clinician: Haywood Pao Referring : Treating /Extender: Alford Highland, [name]: 12 HBO Safety Checklist Items Safety Checklist Consent Form Signed Patient voided / foley secured and emptied When did you last eato 0800 Peanut Butter Toast, Coffee Last dose of injectable or oral agent 0815 5 Units of Humalog Ostomy pouch emptied and vented if applicable NA All implantable devices  assessed, documented and approved NA Freestyle Libre 2 and3 on approved list Intravenous access site secured and place NA Valuables secured Linens and cotton and  cotton/polyester blend (less than 51% polyester) Personal oil-based products / skin lotions / body lotions removed Wigs or hairpieces removed NA human hair extensions approved. Smoking or tobacco materials removed NA Books / newspapers / magazines / loose paper removed Cologne, aftershave, perfume and deodorant removed Jewelry removed (may wrap wedding band) Make-up removed NA Hair care products removed Human Hair extensions approved Libre Freestyle 2 and 3 Sensor Approved, other Battery operated devices (external) removed electronics removed. Heating patches and chemical warmers removed Titanium eyewear removed Nail polish cured greater than 10 hours Casting material cured greater than 10 hours NA Hearing aids removed NA Loose dentures or partials removed NA Prosthetics have been removed NA Patient demonstrates correct use of air break device (if applicable) Patient concerns have been addressed Patient grounding bracelet on and cord attached to chamber Specifics for Inpatients (complete in addition to above) Medication sheet sent with patient NA Intravenous medications needed or due during therapy sent with patient NA Drainage tubes (e.g. nasogastric tube or chest tube secured and vented) NA Endotracheal or Tracheotomy tube secured NA Cuff deflated of air and inflated with saline NA Airway suctioned NA Notes Paper version used prior to treatment start. Electronic Signature(s) Signed: 10/05/2022 11:54:24 AM By: Haywood Pao CHT EMT BS , , Entered By: Haywood Pao on 10/05/2022 11:54:24

## 2022-10-06 ENCOUNTER — Ambulatory Visit: Payer: Medicare Other | Admitting: Podiatry

## 2022-10-06 ENCOUNTER — Encounter (HOSPITAL_BASED_OUTPATIENT_CLINIC_OR_DEPARTMENT_OTHER): Payer: BC Managed Care – PPO | Admitting: Internal Medicine

## 2022-10-06 DIAGNOSIS — M86671 Other chronic osteomyelitis, right ankle and foot: Secondary | ICD-10-CM

## 2022-10-06 DIAGNOSIS — L97514 Non-pressure chronic ulcer of other part of right foot with necrosis of bone: Secondary | ICD-10-CM

## 2022-10-06 DIAGNOSIS — E10621 Type 1 diabetes mellitus with foot ulcer: Secondary | ICD-10-CM

## 2022-10-06 LAB — GLUCOSE, CAPILLARY
Glucose-Capillary: 172 mg/dL — ABNORMAL HIGH (ref 70–99)
Glucose-Capillary: 173 mg/dL — ABNORMAL HIGH (ref 70–99)

## 2022-10-06 NOTE — Progress Notes (Signed)
AMANAT, BEDNARIK ([id number]) [id number] Page 1 of 2 Visit Report for 10/06/2022 Arrival Information Details Patient Name: Date of Service: [name] [name], [name] [name] 10/06/2022 10:00 A M Medical Record Number: [id number] Patient Account Number: [id number] Date of Birth/Sex: Treating RN: [birth year] (57 y.o. [name] [name] Primary Care : [name] [name] Other Clinician: Karl [name] Referring : Treating /Extender: [name] [name], [name]: 12 Visit Information History Since Last Visit All ordered tests and consults were completed: Yes Patient Arrived: Wheel Chair Added or deleted any medications: No Arrival Time: 09:26 Any new allergies or adverse reactions: No Accompanied By: Friend Had a fall or experienced change in No Transfer Assistance: None activities of daily living that may affect Patient Identification Verified: Yes risk of falls: Secondary Verification Process Completed: Yes Signs or symptoms of abuse/neglect since last visito No Patient Requires Transmission-Based Precautions: No Hospitalized since last visit: No Patient Has Alerts: No Implantable device outside of the clinic excluding No cellular tissue based products placed in the center since last visit: Pain Present Now: No Electronic Signature(s) Signed: 10/06/2022 12:18:14 PM By: [name] [name] EMT Entered By: [name] [name] on 10/06/2022 12:18:14 -------------------------------------------------------------------------------- Encounter Discharge Information Details Patient Name: Date of Service: [name] Milo D. 10/06/2022 10:00 A M Medical Record Number: [id number] Patient Account Number: [id number] Date of Birth/Sex: Treating RN: Jul 04, 1965 (57 y.o. [name] [name] Primary Care : [name] [name] Other Clinician: Karl [name] Referring : Treating /Extender: [name] [name] GESH Weeks in  Treatment: 12 Encounter Discharge Information Items Discharge Condition: Stable Ambulatory Status: Wheelchair Discharge Destination: Home Transportation: Private Auto Accompanied By: [name] [name] Schedule Follow-up Appointment: Yes Clinical Summary of Care: Electronic Signature(s) Signed: 10/06/2022 12:26:14 PM By: [name] [name] EMT Entered By: [name] [name] on 10/06/2022 12:26:13 [name] ([id number]) [id number] Page 2 of 2 -------------------------------------------------------------------------------- Vitals Details Patient Name: Date of Service: [name] [name], [name] [name] 10/06/2022 10:00 A M Medical Record Number: [id number] Patient Account Number: [id number] Date of Birth/Sex: Treating RN: 1965-08-11 (57 y.o. [name] [name] Primary Care : [name] [name] Other Clinician: Karl [name] Referring : Treating /Extender: [name] [name], [name]: 12 Vital Signs Time Taken: 09:26 Temperature (F): 97.9 Height (in): 65 Pulse (bpm): 72 Weight (lbs): 105 Respiratory Rate (breaths/min): 20 Body Mass Index (BMI): 17.5 Blood Pressure (mmHg): 112/71 Capillary Blood Glucose (mg/dl): 546 Reference Range: 80 - 120 mg / dl Electronic Signature(s) Signed: 10/06/2022 12:18:46 PM By: [name] [name] EMT Entered By: [name] [name] on 10/06/2022 12:18:46

## 2022-10-06 NOTE — Progress Notes (Signed)
LOUEEN, ELM ([id number]) [id number] Page 1 of 1 Visit Report for 10/05/2022 SuperBill Details Patient Name: Date of Service: [name] [name], [name] [name] 10/05/2022 Medical Record Number: [id number] Patient Account Number: [id number] Date of Birth/Sex: Treating RN: [birth year] (57 y.o. 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, right ankle and foot E10.621 Type 1 diabetes mellitus with foot ulcer Facility Procedures CPT4 Code Description Modifier Quantity [id number] G0277-(Facility Use Only) HBOT full body chamber, , 4 ICD-10 Diagnosis Description E10.621 Type 1 diabetes mellitus with foot ulcer L97.514 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

## 2022-10-06 NOTE — Progress Notes (Addendum)
[name], [name] ([id number]) [id number] Page 1 of 2 Visit Report for 10/06/2022 HBO Details Patient Name: Date of Service: [name] [name], KERESTES 10/06/2022 10:00 A M Medical Record Number: [id number] Patient Account Number: [id number] Date of Birth/Sex: Treating RN: [birth year] (57 y.o. [name] [name] Primary Care : [name] Other Clinician: Karl Bales Referring : Treating /Extender: [name], [name]: 12 HBO Treatment Course Details Treatment Course Number: 2 Ordering : [name] T Treatments Ordered: otal 60 HBO Treatment Start Date: 07/18/2022 HBO Indication: Diabetic Ulcer(s) of the Lower Extremity Standard/Conservative Wound Care tried and failed greater than or equal to 30 days HBO Treatment Details Treatment Number: 55 Patient Type: Outpatient Chamber Type: Monoplace Chamber Serial #: [id number] Treatment Protocol: 2.0 ATA with 90 minutes oxygen, with two 5 minute air breaks Treatment Details Compression Rate Down: 2.0 psi / minute De-Compression Rate Up: 2.0 psi / minute A breaks and breathing ir Compress Tx Pressure periods Decompress Decompress Begins Reached (leave unused spaces Begins Ends blank) Chamber Pressure (ATA 1 2 2 2 2 2  --2 1 ) Clock Time (24 hr) 09:47 09:55 10:25 10:30 11:00 11:05 - - 11:35 11:45 Treatment Length: 118 (minutes) Treatment Segments: 4 Vital Signs Capillary Blood Glucose Reference Range: 80 - 120 mg / dl HBO Diabetic Blood Glucose Intervention Range: <131 mg/dl or >308 mg/dl Time Vitals Blood Respiratory Capillary Blood Glucose Pulse Action Type: Pulse: Temperature: Taken: Pressure: Rate: Glucose (mg/dl): Meter #: Oximetry (%) Taken: Pre 09:26 112/71 72 20 97.9 172 Post 11:52 113/75 73 20 97.3 173 Treatment Response Treatment Toleration: Well Treatment Completion Status: Treatment Completed without Adverse Event Additional  Procedure Documentation Tissue Sevierity: Necrosis of bone Physician HBO Attestation: I certify that I supervised this HBO treatment in accordance with Medicare guidelines. A trained emergency response team is readily available per Yes hospital policies and procedures. Continue HBOT as ordered. Yes Electronic Signature(s) Signed: 10/07/2022 12:35:18 PM By: [name] DO Previous Signature: 10/06/2022 12:25:13 PM Version By: Karl Bales EMT Previous Signature: 10/07/2022 12:24:18 PM Version By: [name] DO Entered By: [name] on 10/07/2022 12:34:59 [name] ([id number]) [id number] Page 2 of 2 -------------------------------------------------------------------------------- HBO Safety Checklist Details Patient Name: Date of Service: [name], [name] 10/06/2022 10:00 A M Medical Record Number: [id number] Patient Account Number: [id number] Date of Birth/Sex: Treating RN: [birth year] (57 y.o. [name] [name] Primary Care : [name] Other Clinician: Karl Bales Referring : Treating /Extender: [name], [name]: 12 HBO Safety Checklist Items Safety Checklist Consent Form Signed Patient voided / foley secured and emptied When did you last eato 0800 Last dose of injectable or oral agent 0815 5 units Humalog Ostomy pouch emptied and vented if applicable NA All implantable devices assessed, documented and approved NA Intravenous access site secured and place NA Valuables secured Linens and cotton and cotton/polyester blend (less than 51% polyester) Personal oil-based products / skin lotions / body lotions removed Wigs or hairpieces removed Human Extensions approved Smoking or tobacco materials removed Books / newspapers / magazines / loose paper removed Cologne, aftershave, perfume and deodorant removed Jewelry removed (may wrap wedding band) Make-up removed Hair  care products removed Battery operated devices (external) removed Libre3 Heating patches and chemical warmers removed Titanium eyewear removed NA Nail polish cured greater than 10 hours Over 10 hours old Casting material cured greater than 10 hours NA Hearing aids removed NA Loose dentures or partials removed NA Prosthetics  have been removed NA Patient demonstrates correct use of air break device (if applicable) Patient concerns have been addressed Patient grounding bracelet on and cord attached to chamber Specifics for Inpatients (complete in addition to above) Medication sheet sent with patient NA Intravenous medications needed or due during therapy sent with patient NA Drainage tubes (e.g. nasogastric tube or chest tube secured and vented) NA Endotracheal or Tracheotomy tube secured NA Cuff deflated of air and inflated with saline NA Airway suctioned NA Electronic Signature(s) Signed: 10/06/2022 12:23:17 PM By: Karl Bales EMT Entered By: Karl Bales on 10/06/2022 12:23:16

## 2022-10-07 ENCOUNTER — Ambulatory Visit (INDEPENDENT_AMBULATORY_CARE_PROVIDER_SITE_OTHER): Payer: BC Managed Care – PPO

## 2022-10-07 ENCOUNTER — Ambulatory Visit (INDEPENDENT_AMBULATORY_CARE_PROVIDER_SITE_OTHER): Payer: BC Managed Care – PPO | Admitting: Podiatry

## 2022-10-07 ENCOUNTER — Encounter (HOSPITAL_BASED_OUTPATIENT_CLINIC_OR_DEPARTMENT_OTHER): Payer: BC Managed Care – PPO | Admitting: Internal Medicine

## 2022-10-07 ENCOUNTER — Encounter: Payer: Self-pay | Admitting: Podiatry

## 2022-10-07 DIAGNOSIS — L97514 Non-pressure chronic ulcer of other part of right foot with necrosis of bone: Secondary | ICD-10-CM | POA: Diagnosis not present

## 2022-10-07 DIAGNOSIS — E10621 Type 1 diabetes mellitus with foot ulcer: Secondary | ICD-10-CM

## 2022-10-07 DIAGNOSIS — M86671 Other chronic osteomyelitis, right ankle and foot: Secondary | ICD-10-CM

## 2022-10-07 DIAGNOSIS — L97414 Non-pressure chronic ulcer of right heel and midfoot with necrosis of bone: Secondary | ICD-10-CM

## 2022-10-07 DIAGNOSIS — M778 Other enthesopathies, not elsewhere classified: Secondary | ICD-10-CM | POA: Diagnosis not present

## 2022-10-07 DIAGNOSIS — M14672 Charcot's joint, left ankle and foot: Secondary | ICD-10-CM | POA: Diagnosis not present

## 2022-10-07 LAB — GLUCOSE, CAPILLARY
Glucose-Capillary: 233 mg/dL — ABNORMAL HIGH (ref 70–99)
Glucose-Capillary: 196 mg/dL — ABNORMAL HIGH (ref 70–99)

## 2022-10-07 NOTE — Progress Notes (Addendum)
CANEI, STROHMEIER ([id number]) [id number] Page 1 of 2 Visit Report for 10/07/2022 HBO Details Patient Name: Date of Service: [name] [name], [name] [name] 10/07/2022 7:30 A M Medical Record Number: [id number] Patient Account Number: [id number] Date of Birth/Sex: Treating RN: April 27, 1965 (57 y.o. [name], [name] Primary Care : [name] Other Clinician: Haywood Pao Referring : Treating /Extender: [name], [name]: 12 HBO Treatment Course Details Treatment Course Number: 2 Ordering : [name] T Treatments Ordered: otal 60 HBO Treatment Start Date: 07/18/2022 HBO Indication: Diabetic Ulcer(s) of the Lower Extremity Standard/Conservative Wound Care tried and failed greater than or equal to 30 days HBO Treatment Details Treatment Number: 56 Patient Type: Outpatient Chamber Type: Monoplace Chamber Serial #: [id number] Treatment Protocol: 2.0 ATA with 90 minutes oxygen, with two 5 minute air breaks Treatment Details Compression Rate Down: 1.5 psi / minute De-Compression Rate Up: 2.0 psi / minute A breaks and breathing ir Compress Tx Pressure periods Decompress Decompress Begins Reached (leave unused spaces Begins Ends blank) Chamber Pressure (ATA 1 2 2 2 2 2  --2 1 ) Clock Time (24 hr) 07:54 08:09 08:39 08:44 09:14 09:19 - - 09:49 09:57 Treatment Length: 123 (minutes) Treatment Segments: 4 Vital Signs Capillary Blood Glucose Reference Range: 80 - 120 mg / dl HBO Diabetic Blood Glucose Intervention Range: <131 mg/dl or >308 mg/dl Type: Time Vitals Blood Respiratory Capillary Blood Glucose Pulse Action Pulse: Temperature: Taken: Pressure: Rate: Glucose (mg/dl): Meter #: Oximetry (%) Taken: Pre 07:35 119/70 78 16 97.2 233 none per protocol Post 09:59 146/92 78 16 97.1 196 none per protocol Treatment Response Treatment Toleration: Well Treatment Completion Status: Treatment Completed  without Adverse Event Treatment Notes Mrs. Tengan arrived with normal vital signs. She ate breakfast and administered Humalog approximately 0630 in normal fashion, peanut butter toast, fruit, coffee and 5 units of Humalog. After performing a safety check she was placed in the chamber which was compressed at a rate of 2 psi/min after confirming normal ear equalization. She tolerated the treatment and subsequent decompression of the chamber at the rate of 2 psi/min. She denied issues with ear equalization and/or pain associated with barotrauma. Her post-treatment vital signs were within normal range. She was stable upon discharge with her driver. Additional Procedure Documentation Tissue Sevierity: Necrosis of bone Physician HBO Attestation: I certify that I supervised this HBO treatment in accordance with Medicare guidelines. A trained emergency response team is readily available per Yes hospital policies and procedures. Continue HBOT as ordered. Yes Electronic Signature(s) Signed: 10/07/2022 12:24:18 PM By: [name] DO Previous Signature: 10/07/2022 11:43:21 AM Version By: Haywood Pao CHT EMT BS , , Previous Signature: 10/07/2022 9:24:12 AM Version By: Haywood Pao CHT EMT BS , , [name] ([id number]) [id number] Page 2 of 2 Entered By: [name] on 10/07/2022 12:23:51 -------------------------------------------------------------------------------- HBO Safety Checklist Details Patient Name: Date of Service: [name] [name] 10/07/2022 7:30 A M Medical Record Number: [id number] Patient Account Number: [id number] Date of Birth/Sex: Treating RN: [birth year] (57 y.o. [name] Primary Care : [name] Other Clinician: Haywood Pao Referring : Treating /Extender: [name], [name]: 12 HBO Safety Checklist Items Safety Checklist Consent Form Signed Patient voided /  foley secured and emptied When did you last eato 0630 -PB T oast, Fruit, Coffee Last dose of injectable or oral agent 0630 5 units Humalog Ostomy pouch emptied and vented if applicable NA All implantable devices assessed, documented  and approved NA Freestyle Libre 2 and3 on approved list Intravenous access site secured and place NA Valuables secured Linens and cotton and cotton/polyester blend (less than 51% polyester) Personal oil-based products / skin lotions / body lotions removed Wigs or hairpieces removed NA Smoking or tobacco materials removed NA Books / newspapers / magazines / loose paper removed Cologne, aftershave, perfume and deodorant removed Jewelry removed (may wrap wedding band) Make-up removed Hair care products removed Libre Freestyle 2 and 3 Sensor Approved, other Battery operated devices (external) removed electronics removed. Heating patches and chemical warmers removed Titanium eyewear removed Nail polish cured greater than 10 hours greater than 10 hours Casting material cured greater than 10 hours NA Hearing aids removed NA Loose dentures or partials removed NA Prosthetics have been removed NA Patient demonstrates correct use of air break device (if applicable) Patient concerns have been addressed Patient grounding bracelet on and cord attached to chamber Specifics for Inpatients (complete in addition to above) Medication sheet sent with patient NA Intravenous medications needed or due during therapy sent with patient NA Drainage tubes (e.g. nasogastric tube or chest tube secured and vented) NA Endotracheal or Tracheotomy tube secured NA Cuff deflated of air and inflated with saline NA Airway suctioned NA Notes Paper version used prior to treatment start. Electronic Signature(s) Signed: 10/07/2022 9:21:13 AM By: Haywood Pao CHT EMT BS , , Entered By: Haywood Pao on 10/07/2022 09:21:13

## 2022-10-07 NOTE — Progress Notes (Signed)
AUTUMM, STROHMAIER ([id number]) [id number] Page 1 of 2 Visit Report for 10/06/2022 Problem List Details Patient Name: Date of Service: [name] [name], [name] [name] 10/06/2022 10:00 A M Medical Record Number: [id number] Patient Account Number: [id number] Date of Birth/Sex: Treating RN: Aug 02, 1965 (57 y.o. 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 M86.671 Other chronic osteomyelitis, right ankle and foot 07/12/2022 No Yes E10.621 Type 1 diabetes mellitus with foot ulcer 07/12/2022 No Yes Inactive Problems Resolved Problems Electronic Signature(s) Signed: 10/06/2022 12:25:41 PM By: Karl Bales EMT Signed: 10/07/2022 12:24:18 PM By: [name] DO Entered By: Karl Bales on 10/06/2022 12:25:41 -------------------------------------------------------------------------------- SuperBill Details Patient Name: Date of Service: [name] 10/06/2022 Medical Record Number: [id number] Patient Account Number: [id number] Date of Birth/Sex: Treating RN: 30-Oct-1965 (57 y.o. Katrinka Blazing Primary Care Provider: [name] Other Clinician: Karl Bales Referring Provider: Treating Provider/Extender: [name], [name]: 12 Diagnosis Coding ICD-10 Codes Code Description [phone] Non-pressure chronic ulcer of other part of right foot with necrosis of bone M86.671 Other chronic osteomyelitis, right ankle and foot E10.621 Type 1 diabetes mellitus with foot ulcer Facility Procedures INGRID, THOMEN ([id number]): CPT4 Code Description Modifier [id number] G0277-(Facility Use Only) HBOT full body chamber,  , ICD-10 Diagnosis Description E10.621 Type 1 diabetes mellitus with foot ulcer L97.514 Non-pressure chronic ulcer of other part  of right foot with necrosis o M86.671 Other chronic osteomyelitis, right ankle and foot [id number] Page 2 of 2: Quantity 4 f bone Physician Procedures : CPT4 Code Description Modifier [phone] - WC PHYS HYPERBARIC OXYGEN THERAPY ICD-10 Diagnosis Description E10.621 Type 1 diabetes mellitus with foot ulcer L97.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

## 2022-10-07 NOTE — Progress Notes (Signed)
ANTHONY, SCHAAD ([id number]) [id number] Page 1 of 1 Visit Report for 10/07/2022 SuperBill Details Patient Name: Date of Service: [name] [name], [name] [name] 10/07/2022 Medical Record Number: [id number] Patient Account Number: [id number] Date of Birth/Sex: Treating RN: 1965-11-05 (57 y.o. [name], [name] Primary Care Provider: [name] Other Clinician: Haywood Pao Referring Provider: Treating Provider/Extender: [name], [name]: 12 Diagnosis Coding ICD-10 Codes Code Description [phone] Non-pressure chronic ulcer of other part of right foot with necrosis of bone M86.671 Other chronic osteomyelitis, right ankle and foot E10.621 Type 1 diabetes mellitus with foot ulcer Facility Procedures CPT4 Code Description Modifier Quantity [id number] G0277-(Facility Use Only) HBOT full body chamber, , 4 ICD-10 Diagnosis Description E10.621 Type 1 diabetes mellitus with foot ulcer 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

## 2022-10-07 NOTE — Progress Notes (Addendum)
[name], [name] ([id number]) [id number] Page 1 of 2 Visit Report for 10/07/2022 Arrival Information Details Patient Name: Date of Service: [name] [name], [name] [name] 10/07/2022 7:30 A M Medical Record Number: [id number] Patient Account Number: [id number] Date of Birth/Sex: Treating RN: December 03, 1965 (57 y.o. [name] [name], [name] Primary Care : [name] [name] Other Clinician: Haywood Pao Referring : Treating /Extender: [name], [name]: 12 Visit Information History Since Last Visit All ordered tests and consults were completed: Yes Patient Arrived: Wheel Chair Added or deleted any medications: No Arrival Time: 07:30 Any new allergies or adverse reactions: No Accompanied By: spouse Had a fall or experienced change in No Transfer Assistance: None activities of daily living that may affect Patient Identification Verified: Yes risk of falls: Secondary Verification Process Completed: Yes Signs or symptoms of abuse/neglect since last visito No Patient Requires Transmission-Based Precautions: No Hospitalized since last visit: No Patient Has Alerts: No Implantable device outside of the clinic excluding No cellular tissue based products placed in the center since last visit: Pain Present Now: No Electronic Signature(s) Signed: 10/07/2022 11:45:19 AM By: Haywood Pao CHT EMT BS , , Previous Signature: 10/07/2022 9:18:10 AM Version By: Haywood Pao CHT EMT BS , , Entered By: Haywood Pao on 10/07/2022 11:45:19 -------------------------------------------------------------------------------- Encounter Discharge Information Details Patient Name: Date of Service: [name] Milo D. 10/07/2022 7:30 A M Medical Record Number: [id number] Patient Account Number: [id number] Date of Birth/Sex: Treating RN: 1965/11/10 (57 y.o. [name] [name] Primary Care : [name] [name] Other Clinician: Haywood Pao Referring : Treating /Extender: [name], [name]: 12 Encounter Discharge Information Items Discharge Condition: Stable Ambulatory Status: Wheelchair Discharge Destination: Home Transportation: Other Accompanied By: driver Schedule Follow-up Appointment: No Clinical Summary of Care: Electronic Signature(s) Signed: 10/07/2022 11:45:05 AM By: Haywood Pao CHT EMT BS , , Entered By: Haywood Pao on 10/07/2022 11:45:05 [name] [name] ([id number]) [id number] Page 2 of 2 -------------------------------------------------------------------------------- Vitals Details Patient Name: Date of Service: [name] [name], [name] [name] 10/07/2022 7:30 A M Medical Record Number: [id number] Patient Account Number: [id number] Date of Birth/Sex: Treating RN: 1965-04-17 (57 y.o. [name] [name], [name] Primary Care : [name] [name] Other Clinician: Karl Bales Referring : Treating /Extender: [name], [name]: 12 Vital Signs Time Taken: 07:35 Temperature (F): 97.2 Height (in): 65 Pulse (bpm): 78 Weight (lbs): 105 Respiratory Rate (breaths/min): 16 Body Mass Index (BMI): 17.5 Blood Pressure (mmHg): 119/70 Capillary Blood Glucose (mg/dl): 536 Reference Range: 80 - 120 mg / dl Electronic Signature(s) Signed: 10/07/2022 9:18:44 AM By: Haywood Pao CHT EMT BS , , Entered By: Haywood Pao on 10/07/2022 09:18:44

## 2022-10-10 ENCOUNTER — Encounter (HOSPITAL_BASED_OUTPATIENT_CLINIC_OR_DEPARTMENT_OTHER): Payer: BC Managed Care – PPO | Admitting: Internal Medicine

## 2022-10-10 DIAGNOSIS — M86671 Other chronic osteomyelitis, right ankle and foot: Secondary | ICD-10-CM

## 2022-10-10 DIAGNOSIS — E10621 Type 1 diabetes mellitus with foot ulcer: Secondary | ICD-10-CM | POA: Diagnosis not present

## 2022-10-10 DIAGNOSIS — L97514 Non-pressure chronic ulcer of other part of right foot with necrosis of bone: Secondary | ICD-10-CM | POA: Diagnosis not present

## 2022-10-10 LAB — GLUCOSE, CAPILLARY
Glucose-Capillary: 212 mg/dL — ABNORMAL HIGH (ref 70–99)
Glucose-Capillary: 149 mg/dL — ABNORMAL HIGH (ref 70–99)

## 2022-10-10 NOTE — Progress Notes (Signed)
ORI, EHRESMAN ([id number]) [id number] Page 1 of 2 Visit Report for 10/10/2022 Arrival Information Details Patient Name: Date of Service: [name] [name], [name] [name] 10/10/2022 7:30 A M Medical Record Number: [id number] Patient Account Number: [id number] Date of Birth/Sex: Treating RN: 08-13-1965 (57 y.o. [name] Primary Care : [name] Other Clinician: Karl Bales Referring : Treating /Extender: [name], [name]: 12 Visit Information History Since Last Visit All ordered tests and consults were completed: Yes Patient Arrived: Wheel Chair Added or deleted any medications: No Arrival Time: 07:30 Any new allergies or adverse reactions: No Accompanied By: Husband Had a fall or experienced change in No Transfer Assistance: None activities of daily living that may affect Patient Identification Verified: Yes risk of falls: Secondary Verification Process Completed: Yes Signs or symptoms of abuse/neglect since last visito No Patient Requires Transmission-Based Precautions: No Hospitalized since last visit: No Patient Has Alerts: No Implantable device outside of the clinic excluding No cellular tissue based products placed in the center since last visit: Pain Present Now: No Electronic Signature(s) Signed: 10/10/2022 12:20:23 PM By: Karl Bales EMT Entered By: Karl Bales on 10/10/2022 12:20:23 -------------------------------------------------------------------------------- Encounter Discharge Information Details Patient Name: Date of Service: [name] Milo D. 10/10/2022 7:30 A M Medical Record Number: [id number] Patient Account Number: [id number] Date of Birth/Sex: Treating RN: 1965-10-13 (57 y.o. [name] Primary Care : [name] Other Clinician: Karl Bales Referring : Treating /Extender: Caleen Essex GESH Weeks in  Treatment: 12 Encounter Discharge Information Items Discharge Condition: Stable Ambulatory Status: Wheelchair Discharge Destination: Home Transportation: Private Auto Accompanied By: Husband Schedule Follow-up Appointment: Yes Clinical Summary of Care: Electronic Signature(s) Signed: 10/10/2022 12:26:21 PM By: Karl Bales EMT Entered By: Karl Bales on 10/10/2022 12:26:21 [name] ([id number]) [id number] Page 2 of 2 -------------------------------------------------------------------------------- Vitals Details Patient Name: Date of Service: [name] [name], [name] [name] 10/10/2022 7:30 A M Medical Record Number: [id number] Patient Account Number: [id number] Date of Birth/Sex: Treating RN: November 23, 1965 (57 y.o. [name] Primary Care : [name] Other Clinician: Karl Bales Referring : Treating /Extender: [name], [name]: 12 Vital Signs Time Taken: 08:00 Temperature (F): 97.2 Height (in): 65 Pulse (bpm): 72 Weight (lbs): 105 Respiratory Rate (breaths/min): 18 Body Mass Index (BMI): 17.5 Blood Pressure (mmHg): 129/74 Capillary Blood Glucose (mg/dl): 433 Reference Range: 80 - 120 mg / dl Electronic Signature(s) Signed: 10/10/2022 12:20:55 PM By: Karl Bales EMT Entered By: Karl Bales on 10/10/2022 12:20:55

## 2022-10-10 NOTE — Progress Notes (Addendum)
[name] [name] ([id number]) [id number] Page 1 of 2 Visit Report for 10/10/2022 HBO Details Patient Name: Date of Service: [name] [name], PLATEK 10/10/2022 7:30 A M Medical Record Number: [id number] Patient Account Number: [id number] Date of Birth/Sex: Treating RN: Jan 27, 1966 (57 y.o. [name] [name], [name] [name] Primary Care : [name] Other Clinician: Karl Bales Referring : Treating /Extender: Caleen Essex GESH Weeks in Treatment: 12 HBO Treatment Course Details Treatment Course Number: 2 Ordering : [name] T Treatments Ordered: otal 60 HBO Treatment Start Date: 07/18/2022 HBO Indication: Diabetic Ulcer(s) of the Lower Extremity Standard/Conservative Wound Care tried and failed greater than or equal to 30 days HBO Treatment Details Treatment Number: 57 Patient Type: Outpatient Chamber Type: Monoplace Chamber Serial #: [id number] Treatment Protocol: 2.0 ATA with 90 minutes oxygen, with two 5 minute air breaks Treatment Details Compression Rate Down: 1.5 psi / minute De-Compression Rate Up: 2.0 psi / minute A breaks and breathing ir Compress Tx Pressure periods Decompress Decompress Begins Reached (leave unused spaces Begins Ends blank) Chamber Pressure (ATA 1 2 2 2 2 2  --2 1 ) Clock Time (24 hr) 08:17 08:27 08:57 09:02 09:32 09:37 - - 10:07 10:14 Treatment Length: 117 (minutes) Treatment Segments: 4 Vital Signs Capillary Blood Glucose Reference Range: 80 - 120 mg / dl HBO Diabetic Blood Glucose Intervention Range: <131 mg/dl or >308 mg/dl Time Vitals Blood Respiratory Capillary Blood Glucose Pulse Action Type: Pulse: Temperature: Taken: Pressure: Rate: Glucose (mg/dl): Meter #: Oximetry (%) Taken: Pre 08:00 129/74 72 18 97.2 212 Post 10:19 161/93 72 18 97.9 149 Treatment Response Treatment Toleration: Well Treatment Completion Status: Treatment Completed without Adverse Event Additional  Procedure Documentation Tissue Sevierity: Necrosis of bone Physician HBO Attestation: I certify that I supervised this HBO treatment in accordance with Medicare guidelines. A trained emergency response team is readily available per Yes hospital policies and procedures. Continue HBOT as ordered. Yes Electronic Signature(s) Signed: 10/10/2022 4:52:09 PM By: [name] DO Previous Signature: 10/10/2022 12:25:15 PM Version By: Karl Bales EMT Entered By: [name] on 10/10/2022 16:49:21 [name] ([id number]) [id number] Page 2 of 2 -------------------------------------------------------------------------------- HBO Safety Checklist Details Patient Name: Date of Service: [name] [name], [name] [name] 10/10/2022 7:30 A M Medical Record Number: [id number] Patient Account Number: [id number] Date of Birth/Sex: Treating RN: 1965-12-25 (57 y.o. [name] [name], [name] [name] Primary Care : [name] Other Clinician: Karl Bales Referring : Treating /Extender: [name], [name]: 12 HBO Safety Checklist Items Safety Checklist Consent Form Signed Patient voided / foley secured and emptied When did you last eato 0630 Last dose of injectable or oral agent 0630 5 units Humalog Ostomy pouch emptied and vented if applicable NA All implantable devices assessed, documented and approved NA Intravenous access site secured and place NA Valuables secured Linens and cotton and cotton/polyester blend (less than 51% polyester) Personal oil-based products / skin lotions / body lotions removed Wigs or hairpieces removed Human Extensions approved Smoking or tobacco materials removed Books / newspapers / magazines / loose paper removed Cologne, aftershave, perfume and deodorant removed Jewelry removed (may wrap wedding band) Make-up removed Hair care products removed Battery operated devices (external) removed  Libre3 Heating patches and chemical warmers removed Titanium eyewear removed NA Nail polish cured greater than 10 hours Over 10 hours old Casting material cured greater than 10 hours NA Hearing aids removed NA Loose dentures or partials removed NA Prosthetics have been removed NA Patient demonstrates correct use of air  break device (if applicable) Patient concerns have been addressed Patient grounding bracelet on and cord attached to chamber Specifics for Inpatients (complete in addition to above) Medication sheet sent with patient NA Intravenous medications needed or due during therapy sent with patient NA Drainage tubes (e.g. nasogastric tube or chest tube secured and vented) NA Endotracheal or Tracheotomy tube secured NA Cuff deflated of air and inflated with saline NA Airway suctioned NA Notes The safety checklist was done before the treatment was started. Electronic Signature(s) Signed: 10/10/2022 12:22:32 PM By: Karl Bales EMT Entered By: Karl Bales on 10/10/2022 12:22:31

## 2022-10-10 NOTE — Progress Notes (Signed)
MYLEA, PENDELTON ([id number]) [id number] Page 1 of 2 Visit Report for 10/10/2022 Problem List Details Patient Name: Date of Service: [name] [name], [name] [name] 10/10/2022 7:30 A M Medical Record Number: [id number] Patient Account Number: [id number] Date of Birth/Sex: Treating RN: 11-25-1965 (57 y.o. 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 M86.671 Other chronic osteomyelitis, right ankle and foot 07/12/2022 No Yes E10.621 Type 1 diabetes mellitus with foot ulcer 07/12/2022 No Yes Inactive Problems Resolved Problems Electronic Signature(s) Signed: 10/10/2022 12:25:45 PM By: Karl Bales EMT Signed: 10/10/2022 4:52:09 PM By: [name] DO Entered By: Karl Bales on 10/10/2022 12:25:45 -------------------------------------------------------------------------------- SuperBill Details Patient Name: Date of Service: [name] [name] 10/10/2022 Medical Record Number: [id number] Patient Account Number: [id number] Date of Birth/Sex: Treating RN: 01-27-1966 (57 y.o. [name] [name], [name] [name] Primary Care Provider: [name] Other Clinician: Karl Bales Referring Provider: Treating Provider/Extender: [name], [name]: 12 Diagnosis Coding ICD-10 Codes Code Description [phone] Non-pressure chronic ulcer of other part of right foot with necrosis of bone M86.671 Other chronic osteomyelitis, right ankle and foot E10.621 Type 1 diabetes mellitus with foot ulcer Facility Procedures CHARLINE, BAHENA ([id number]): CPT4 Code Description Modifier [id number] G0277-(Facility Use Only) HBOT full body chamber,  , ICD-10 Diagnosis Description E10.621 Type 1 diabetes mellitus with foot ulcer 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-pressure chronic ulcer of other part  of right foot with necrosis o M86.671 Other chronic osteomyelitis, right ankle and foot [id number] Page 2 of 2: Quantity 4 f bone Physician Procedures : CPT4 Code Description Modifier [phone] - WC PHYS HYPERBARIC OXYGEN THERAPY ICD-10 Diagnosis Description E10.621 Type 1 diabetes mellitus with foot ulcer 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

## 2022-10-11 ENCOUNTER — Encounter (HOSPITAL_BASED_OUTPATIENT_CLINIC_OR_DEPARTMENT_OTHER): Payer: BC Managed Care – PPO | Admitting: Internal Medicine

## 2022-10-11 DIAGNOSIS — M86671 Other chronic osteomyelitis, right ankle and foot: Secondary | ICD-10-CM

## 2022-10-11 DIAGNOSIS — E10621 Type 1 diabetes mellitus with foot ulcer: Secondary | ICD-10-CM

## 2022-10-11 DIAGNOSIS — L97514 Non-pressure chronic ulcer of other part of right foot with necrosis of bone: Secondary | ICD-10-CM

## 2022-10-11 LAB — GLUCOSE, CAPILLARY
Glucose-Capillary: 185 mg/dL — ABNORMAL HIGH (ref 70–99)
Glucose-Capillary: 349 mg/dL — ABNORMAL HIGH (ref 70–99)

## 2022-10-11 NOTE — Progress Notes (Signed)
YARIXA, DAWES ([id number]) [id number] Page 1 of 2 Visit Report for 10/11/2022 Problem List Details Patient Name: Date of Service: MARK, SCHAFFTER 10/11/2022 10:00 A M Medical Record Number: [id number] Patient Account Number: [id number] Date of Birth/Sex: Treating RN: 1965-08-25 (57 y.o. 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 M86.671 Other chronic osteomyelitis, right ankle and foot 07/12/2022 No Yes E10.621 Type 1 diabetes mellitus with foot ulcer 07/12/2022 No Yes Inactive Problems Resolved Problems Electronic Signature(s) Signed: 10/11/2022 1:38:00 PM By: Karl Bales EMT Signed: 10/11/2022 4:37:08 PM By: [name] DO Entered By: Karl Bales on 10/11/2022 13:38:00 -------------------------------------------------------------------------------- SuperBill Details Patient Name: Date of Service: [name] 10/11/2022 Medical Record Number: [id number] Patient Account Number: [id number] Date of Birth/Sex: Treating RN: 03/28/1965 (57 y.o. 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 Other chronic osteomyelitis, right ankle and foot E10.621 Type 1 diabetes mellitus with foot ulcer Facility Procedures DANY, WILHELM ([id number]): CPT4 Code Description Modifier [id number]-(Facility Use Only) HBOT full body chamber,  , ICD-10 Diagnosis Description E10.621 Type 1 diabetes mellitus with foot ulcer L97.514 Non-pressure chronic ulcer of other part  of right foot with necrosis o M86.671 Other chronic osteomyelitis, right ankle and foot [id number] Page 2 of 2: Quantity 4 f bone Physician Procedures : CPT4 Code Description Modifier [phone] - WC PHYS HYPERBARIC OXYGEN THERAPY ICD-10 Diagnosis Description E10.621 Type 1 diabetes mellitus with foot ulcer L97.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

## 2022-10-11 NOTE — Progress Notes (Addendum)
KYSA, HECKMANN ([id number]) [id number] Page 1 of 2 Visit Report for 10/11/2022 HBO Details Patient Name: Date of Service: [name] [name], [name] [name] 10/11/2022 10:00 A M Medical Record Number: [id number] Patient Account Number: [id number] Date of Birth/Sex: Treating RN: 1966-02-19 (57 y.o. [name], [name] Primary Care : [name] Other Clinician: Karl Bales Referring : Treating /Extender: [name], [name]: 13 HBO Treatment Course Details Treatment Course Number: 2 Ordering : [name] T Treatments Ordered: otal 60 HBO Treatment Start Date: 07/18/2022 HBO Indication: Diabetic Ulcer(s) of the Lower Extremity Standard/Conservative Wound Care tried and failed greater than or equal to 30 days HBO Treatment Details Treatment Number: 58 Patient Type: Outpatient Chamber Type: Monoplace Chamber Serial #: [id number] Treatment Protocol: 2.0 ATA with 90 minutes oxygen, with two 5 minute air breaks Treatment Details Compression Rate Down: 2.0 psi / minute De-Compression Rate Up: 2.0 psi / minute A breaks and breathing ir Compress Tx Pressure periods Decompress Decompress Begins Reached (leave unused spaces Begins Ends blank) Chamber Pressure (ATA 1 2 2 2 2 2  --2 1 ) Clock Time (24 hr) 10:16 10:27 10:57 11:02 11:32 11:37 - - 12:07 12:14 Treatment Length: 118 (minutes) Treatment Segments: 4 Vital Signs Capillary Blood Glucose Reference Range: 80 - 120 mg / dl HBO Diabetic Blood Glucose Intervention Range: <131 mg/dl or >952 mg/dl Time Vitals Blood Respiratory Capillary Blood Glucose Pulse Action Type: Pulse: Temperature: Taken: Pressure: Rate: Glucose (mg/dl): Meter #: Oximetry (%) Taken: Pre 09:39 136/81 73 18 97.8 185 Post 12:21 162/90 75 18 98.4 349 Treatment Response Treatment Toleration: Well Treatment Completion Status: Treatment Completed without Adverse Event Treatment  Notes Dr. Mikey Bussing informed of the patient post treatment blood sugar. Additional Procedure Documentation Tissue Sevierity: Necrosis of bone Physician HBO Attestation: I certify that I supervised this HBO treatment in accordance with Medicare guidelines. A trained emergency response team is readily available per Yes hospital policies and procedures. Continue HBOT as ordered. Yes Electronic Signature(s) Signed: 10/11/2022 4:37:08 PM By: [name] DO Previous Signature: 10/11/2022 1:37:28 PM Version By: Karl Bales EMT Entered By: [name] on 10/11/2022 16:35:23 [name] ([id number]) [id number].pdf Page 2 of 2 -------------------------------------------------------------------------------- HBO Safety Checklist Details Patient Name: Date of Service: [name] [name], [name] [name] 10/11/2022 10:00 A M Medical Record Number: [id number] Patient Account Number: [id number] Date of Birth/Sex: Treating RN: 03/30/1965 (57 y.o. [name], [name] Primary Care : [name] Other Clinician: Karl Bales Referring : Treating /Extender: [name], [name]: 13 HBO Safety Checklist Items Safety Checklist Consent Form Signed Patient voided / foley secured and emptied When did you last eato 0700 Last dose of injectable or oral agent 0700 5 units Humalog Ostomy pouch emptied and vented if applicable NA All implantable devices assessed, documented and approved NA Intravenous access site secured and place NA Valuables secured Linens and cotton and cotton/polyester blend (less than 51% polyester) Personal oil-based products / skin lotions / body lotions removed Wigs or hairpieces removed Human Extensions approved Smoking or tobacco materials removed Books / newspapers / magazines / loose paper removed Cologne, aftershave, perfume and deodorant removed Jewelry removed (may wrap wedding band) Make-up  removed Hair care products removed Battery operated devices (external) removed Libre3 Heating patches and chemical warmers removed Titanium eyewear removed NA Nail polish cured greater than 10 hours Over 10 hours old Casting material cured greater than 10 hours NA Hearing aids removed NA Loose dentures or partials removed  NA Prosthetics have been removed NA Patient demonstrates correct use of air break device (if applicable) Patient concerns have been addressed Patient grounding bracelet on and cord attached to chamber Specifics for Inpatients (complete in addition to above) Medication sheet sent with patient NA Intravenous medications needed or due during therapy sent with patient NA Drainage tubes (e.g. nasogastric tube or chest tube secured and vented) NA Endotracheal or Tracheotomy tube secured NA Cuff deflated of air and inflated with saline NA Airway suctioned NA Electronic Signature(s) Signed: 10/11/2022 1:35:00 PM By: Karl Bales EMT Entered By: Karl Bales on 10/11/2022 13:35:00

## 2022-10-11 NOTE — Progress Notes (Signed)
TAPANGA, SHERRATT ([id number]) [id number] Page 1 of 2 Visit Report for 10/11/2022 Arrival Information Details Patient Name: Date of Service: [name] [name], [name] [name] 10/11/2022 10:00 A M Medical Record Number: [id number] Patient Account Number: [id number] Date of Birth/Sex: Treating RN: [birth year] (57 y.o. [name], [name] Primary Care : [name] Other Clinician: Karl Bales Referring : Treating /Extender: [name], [name]: 13 Visit Information History Since Last Visit All ordered tests and consults were completed: Yes Patient Arrived: Wheel Chair Added or deleted any medications: No Arrival Time: 09:30 Any new allergies or adverse reactions: No Accompanied By: Husband Had a fall or experienced change in No Transfer Assistance: None activities of daily living that may affect Patient Identification Verified: Yes risk of falls: Secondary Verification Process Completed: Yes Signs or symptoms of abuse/neglect since last visito No Patient Requires Transmission-Based Precautions: No Hospitalized since last visit: No Patient Has Alerts: No Implantable device outside of the clinic excluding No cellular tissue based products placed in the center since last visit: Pain Present Now: No Electronic Signature(s) Signed: 10/11/2022 1:32:26 PM By: Karl Bales EMT Entered By: Karl Bales on 10/11/2022 13:32:25 -------------------------------------------------------------------------------- Encounter Discharge Information Details Patient Name: Date of Service: [name] Milo D. 10/11/2022 10:00 A M Medical Record Number: [id number] Patient Account Number: [id number] Date of Birth/Sex: Treating RN: [birth year] (57 y.o. [name] Primary Care : [name] Other Clinician: Karl Bales Referring : Treating /Extender: Caleen Essex GESH Weeks in  Treatment: 71 Encounter Discharge Information Items Discharge Condition: Stable Ambulatory Status: Wheelchair Discharge Destination: Other (Note Required) Schedule Follow-up Appointment: Yes Clinical Summary of Care: Notes The patient was discharged to Wound Care Clinic staff. The patient had a wound care doctor appointment after her treatment today. Electronic Signature(s) Signed: 10/11/2022 1:39:45 PM By: Karl Bales EMT Entered By: Karl Bales on 10/11/2022 13:39:45 [name] ([id number]) [id number] Page 2 of 2 -------------------------------------------------------------------------------- Vitals Details Patient Name: Date of Service: [name] [name], [name] [name] 10/11/2022 10:00 A M Medical Record Number: [id number] Patient Account Number: [id number] Date of Birth/Sex: Treating RN: [birth year] (57 y.o. [name], [name] Primary Care : [name] Other Clinician: Karl Bales Referring : Treating /Extender: [name], [name]: 13 Vital Signs Time Taken: 09:39 Temperature (F): 97.8 Height (in): 65 Pulse (bpm): 73 Weight (lbs): 105 Respiratory Rate (breaths/min): 18 Body Mass Index (BMI): 17.5 Blood Pressure (mmHg): 136/81 Capillary Blood Glucose (mg/dl): 254 Reference Range: 80 - 120 mg / dl Electronic Signature(s) Signed: 10/11/2022 1:33:36 PM By: Karl Bales EMT Entered By: Karl Bales on 10/11/2022 13:33:35

## 2022-10-12 ENCOUNTER — Encounter (HOSPITAL_BASED_OUTPATIENT_CLINIC_OR_DEPARTMENT_OTHER): Payer: BC Managed Care – PPO | Admitting: Physician Assistant

## 2022-10-12 DIAGNOSIS — E10621 Type 1 diabetes mellitus with foot ulcer: Secondary | ICD-10-CM | POA: Diagnosis not present

## 2022-10-12 LAB — GLUCOSE, CAPILLARY
Glucose-Capillary: 171 mg/dL — ABNORMAL HIGH (ref 70–99)
Glucose-Capillary: 122 mg/dL — ABNORMAL HIGH (ref 70–99)

## 2022-10-12 NOTE — Progress Notes (Signed)
OLGIE, REEDUS ([id number]) [id number] Page 1 of 2 Visit Report for 10/12/2022 Arrival Information Details Patient Name: Date of Service: LATIFA, VOORHIS 10/12/2022 10:00 A M Medical Record Number: [id number] Patient Account Number: [id number] Date of Birth/Sex: Treating RN: 09-10-1965 (57 y.o. [name] Primary Care : [name] Other Clinician: Karl Bales Referring : Treating /Extender: Alford Highland, [name]: 13 Visit Information History Since Last Visit All ordered tests and consults were completed: Yes Patient Arrived: Wheel Chair Added or deleted any medications: No Arrival Time: 09:29 Any new allergies or adverse reactions: No Accompanied By: Friend Had a fall or experienced change in No Transfer Assistance: None activities of daily living that may affect Patient Identification Verified: Yes risk of falls: Secondary Verification Process Completed: Yes Signs or symptoms of abuse/neglect since last visito No Patient Requires Transmission-Based Precautions: No Hospitalized since last visit: No Patient Has Alerts: No Implantable device outside of the clinic excluding No cellular tissue based products placed in the center since last visit: Pain Present Now: No Electronic Signature(s) Signed: 10/12/2022 12:47:07 PM By: Karl Bales EMT Entered By: Karl Bales on 10/12/2022 12:47:07 -------------------------------------------------------------------------------- Encounter Discharge Information Details Patient Name: Date of Service: [name] 10/12/2022 10:00 A M Medical Record Number: [id number] Patient Account Number: [id number] Date of Birth/Sex: Treating RN: 15-Jun-1965 (57 y.o. [name] Primary Care : [name] Other Clinician: Karl Bales Referring : Treating /Extender: Alford Highland, YO GESH Weeks in  Treatment: 29 Encounter Discharge Information Items Discharge Condition: Stable Ambulatory Status: Wheelchair Discharge Destination: Home Transportation: Private Auto Accompanied By: None Schedule Follow-up Appointment: Yes Clinical Summary of Care: Electronic Signature(s) Signed: 10/12/2022 1:09:31 PM By: Karl Bales EMT Entered By: Karl Bales on 10/12/2022 13:09:31 [name] ([id number]) [id number] Page 2 of 2 -------------------------------------------------------------------------------- Vitals Details Patient Name: Date of Service: ALETSE, BIONDOLILLO 10/12/2022 10:00 A M Medical Record Number: [id number] Patient Account Number: [id number] Date of Birth/Sex: Treating RN: Sep 12, 1965 (58 y.o. [name] Primary Care : [name] Other Clinician: Karl Bales Referring : Treating /Extender: Alford Highland, [name]: 13 Vital Signs Time Taken: 09:34 Temperature (F): 97.2 Height (in): 65 Pulse (bpm): 69 Weight (lbs): 105 Respiratory Rate (breaths/min): 18 Body Mass Index (BMI): 17.5 Blood Pressure (mmHg): 129/72 Capillary Blood Glucose (mg/dl): 732 Reference Range: 80 - 120 mg / dl Electronic Signature(s) Signed: 10/12/2022 12:48:50 PM By: Karl Bales EMT Entered By: Karl Bales on 10/12/2022 12:48:50

## 2022-10-12 NOTE — Progress Notes (Signed)
JIL, FULLENKAMP ([id number]) [id number] Page 1 of 2 Visit Report for 10/12/2022 HBO Details Patient Name: Date of Service: [name] [name], [name] [name] 10/12/2022 10:00 A M Medical Record Number: [id number] Patient Account Number: [id number] Date of Birth/Sex: Treating RN: Dec 26, 1965 (57 y.o. [name], [name] Primary Care : [name] Other Clinician: Karl Bales Referring : Treating /Extender: Alford Highland, [name]: 13 HBO Treatment Course Details Treatment Course Number: 2 Ordering : [name] T Treatments Ordered: otal 60 HBO Treatment Start Date: 07/18/2022 HBO Indication: Diabetic Ulcer(s) of the Lower Extremity Standard/Conservative Wound Care tried and failed greater than or equal to 30 days HBO Treatment Details Treatment Number: 59 Patient Type: Outpatient Chamber Type: Monoplace Chamber Serial #: [id number] Treatment Protocol: 2.0 ATA with 90 minutes oxygen, with two 5 minute air breaks Treatment Details Compression Rate Down: 1.5 psi / minute De-Compression Rate Up: 2.0 psi / minute A breaks and breathing ir Compress Tx Pressure periods Decompress Decompress Begins Reached (leave unused spaces Begins Ends blank) Chamber Pressure (ATA 1 2 2 2 2 2  --2 1 ) Clock Time (24 hr) 09:58 10:07 10:37 10:42 11:12 11:17 - - 11:47 11:54 Treatment Length: 116 (minutes) Treatment Segments: 4 Vital Signs Capillary Blood Glucose Reference Range: 80 - 120 mg / dl HBO Diabetic Blood Glucose Intervention Range: <131 mg/dl or >308 mg/dl Time Vitals Blood Respiratory Capillary Blood Glucose Pulse Action Type: Pulse: Temperature: Taken: Pressure: Rate: Glucose (mg/dl): Meter #: Oximetry (%) Taken: Pre 09:34 129/72 69 18 97.2 171 Post 12:01 124/88 69 18 98.2 122 Treatment Response Treatment Toleration: Well Treatment Completion Status: Treatment Completed without Adverse Event Additional  Procedure Documentation Tissue Sevierity: Necrosis of bone Electronic Signature(s) Signed: 10/12/2022 1:04:51 PM By: Karl Bales EMT Signed: 10/12/2022 5:04:24 PM By: Allen Derry PA-C Entered By: Karl Bales on 10/12/2022 13:04:50 HBO Safety Checklist Details -------------------------------------------------------------------------------- [name] ([id number]) [id number] Page 2 of 2 Patient Name: Date of Service: [name] [name], [name] [name] 10/12/2022 10:00 A M Medical Record Number: [id number] Patient Account Number: [id number] Date of Birth/Sex: Treating RN: [birth year] (57 y.o. [name] Primary Care : [name] Other Clinician: Karl Bales Referring : Treating /Extender: Alford Highland, [name]: 13 HBO Safety Checklist Items Safety Checklist Consent Form Signed Patient voided / foley secured and emptied When did you last eato 0700 Last dose of injectable or oral agent 0700 6 units Humalog Ostomy pouch emptied and vented if applicable NA All implantable devices assessed, documented and approved NA Intravenous access site secured and place NA Valuables secured Linens and cotton and cotton/polyester blend (less than 51% polyester) Personal oil-based products / skin lotions / body lotions removed Wigs or hairpieces removed NA Smoking or tobacco materials removed Books / newspapers / magazines / loose paper removed Cologne, aftershave, perfume and deodorant removed Jewelry removed (may wrap wedding band) Make-up removed NA Hair care products removed Battery operated devices (external) removed Heating patches and chemical warmers removed Titanium eyewear removed NA Nail polish cured greater than 10 hours NA Casting material cured greater than 10 hours NA Hearing aids removed NA Loose dentures or partials removed NA Prosthetics have been removed NA Patient demonstrates  correct use of air break device (if applicable) Patient concerns have been addressed Patient grounding bracelet on and cord attached to chamber Specifics for Inpatients (complete in addition to above) Medication sheet sent with patient NA Intravenous medications needed or due during therapy sent  with patient NA Drainage tubes (e.g. nasogastric tube or chest tube secured and vented) NA Endotracheal or Tracheotomy tube secured NA Cuff deflated of air and inflated with saline NA Airway suctioned NA Notes The Safety checklist was done before the treatment was started. Electronic Signature(s) Signed: 10/12/2022 12:54:54 PM By: Karl Bales EMT Entered By: Karl Bales on 10/12/2022 12:54:54

## 2022-10-13 ENCOUNTER — Telehealth: Payer: Self-pay | Admitting: Podiatry

## 2022-10-13 ENCOUNTER — Other Ambulatory Visit: Payer: BC Managed Care – PPO

## 2022-10-13 ENCOUNTER — Encounter (HOSPITAL_BASED_OUTPATIENT_CLINIC_OR_DEPARTMENT_OTHER): Payer: BC Managed Care – PPO | Admitting: Internal Medicine

## 2022-10-13 DIAGNOSIS — E10621 Type 1 diabetes mellitus with foot ulcer: Secondary | ICD-10-CM

## 2022-10-13 DIAGNOSIS — M86671 Other chronic osteomyelitis, right ankle and foot: Secondary | ICD-10-CM | POA: Diagnosis not present

## 2022-10-13 DIAGNOSIS — L97514 Non-pressure chronic ulcer of other part of right foot with necrosis of bone: Secondary | ICD-10-CM | POA: Diagnosis not present

## 2022-10-13 LAB — GLUCOSE, CAPILLARY
Glucose-Capillary: 179 mg/dL — ABNORMAL HIGH (ref 70–99)
Glucose-Capillary: 136 mg/dL — ABNORMAL HIGH (ref 70–99)

## 2022-10-13 NOTE — Telephone Encounter (Signed)
Patient called and left voicemail.  She was inquiring as to whether Dr. Ardelle Anton had received a certificate of medical necessity?  She did not elaborate as to what for, etc.  I tried to call her back to get clarification, but no answer.  Her callback # [phone]

## 2022-10-13 NOTE — Progress Notes (Signed)
JOUMANA, BEICHNER ([id number]) [id number] Page 1 of 2 Visit Report for 10/12/2022 Problem List Details Patient Name: Date of Service: [name] [name], [name] [name] 10/12/2022 10:00 A M Medical Record Number: [id number] Patient Account Number: [id number] Date of Birth/Sex: Treating RN: November 11, 1965 (57 y.o. 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 Other chronic osteomyelitis, right ankle and foot 07/12/2022 No Yes E10.621 Type 1 diabetes mellitus with foot ulcer 07/12/2022 No Yes Inactive Problems Resolved Problems Electronic Signature(s) Signed: 10/12/2022 1:08:03 PM By: Karl Bales EMT Signed: 10/12/2022 5:04:24 PM By: Allen Derry PA-C Entered By: Karl Bales on 10/12/2022 13:08:03 -------------------------------------------------------------------------------- SuperBill Details Patient Name: Date of Service: [name] [name] 10/12/2022 Medical Record Number: [id number] Patient Account Number: [id number] Date of Birth/Sex: Treating RN: [birth year] (57 y.o. 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 Other chronic osteomyelitis, right ankle and foot E10.621 Type 1 diabetes mellitus with foot ulcer Facility Procedures XZANDRIA, MAYERHOFER ([id number]): CPT4 Code Description Modifier [id number]-(Facility Use Only) HBOT full body chamber,  , ICD-10 Diagnosis Description E10.621 Type 1 diabetes mellitus with foot ulcer L97.514 Non-pressure chronic ulcer of other part  of right foot with necrosis o 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 Other chronic osteomyelitis, right ankle and foot [id number] Page 2 of 2: Quantity 4 f bone Physician Procedures : CPT4 Code Description Modifier [phone] - WC PHYS HYPERBARIC OXYGEN THERAPY ICD-10 Diagnosis Description E10.621 Type 1 diabetes mellitus with foot ulcer L97.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

## 2022-10-13 NOTE — Progress Notes (Addendum)
[name], [name] ([id number]) [id number] Page 1 of 2 Visit Report for 10/13/2022 HBO Details Patient Name: Date of Service: [name] [name], [name] [name] 10/13/2022 10:00 A M Medical Record Number: [id number] Patient Account Number: [id number] Date of Birth/Sex: Treating RN: Apr 03, 1965 (57 y.o. Ardis Rowan, Lauren Primary Care : [name] Other Clinician: Karl Bales Referring : Treating /Extender: [name], [name]: 13 HBO Treatment Course Details Treatment Course Number: 2 Ordering : [name] T Treatments Ordered: otal 60 HBO Treatment 07/18/2022 Start Date: HBO Indication: Diabetic Ulcer(s) of the Lower Extremity HBO Treatment 10/13/2022 End Date: Standard/Conservative Wound Care tried and failed greater than or equal to 30 days HBO Discharge Treatment Series Complete; Wound Progress Plateaued / Outcome: Significant Improvement HBO Treatment Details Treatment Number: 60 Patient Type: Outpatient Chamber Type: Monoplace Chamber Serial #: [id number] Treatment Protocol: 2.0 ATA with 90 minutes oxygen, with two 5 minute air breaks Treatment Details Compression Rate Down: 2.0 psi / minute De-Compression Rate Up: 2.0 psi / minute A breaks and breathing ir Compress Tx Pressure periods Decompress Decompress Begins Reached (leave unused spaces Begins Ends blank) Chamber Pressure (ATA 1 2 2 2 2 2  --2 1 ) Clock Time (24 hr) 09:51 10:00 10:30 10:35 11:05 11:10 - - 11:40 11:49 Treatment Length: 118 (minutes) Treatment Segments: 4 Vital Signs Capillary Blood Glucose Reference Range: 80 - 120 mg / dl HBO Diabetic Blood Glucose Intervention Range: <131 mg/dl or >952 mg/dl Time Vitals Blood Respiratory Capillary Blood Glucose Pulse Action Type: Pulse: Temperature: Taken: Pressure: Rate: Glucose (mg/dl): Meter #: Oximetry (%) Taken: Pre 09:33 134/78 69 18 97.2 179 Post 11:53 122/83 70 18  97 136 Treatment Response Treatment Toleration: Well Treatment Completion Status: Treatment Completed without Adverse Event Additional Procedure Documentation Tissue Sevierity: Necrosis of bone Physician HBO Attestation: I certify that I supervised this HBO treatment in accordance with Medicare guidelines. A trained emergency response team is readily available per Yes hospital policies and procedures. Continue HBOT as ordered. Yes Electronic Signature(s) Signed: 10/13/2022 4:25:59 PM By: [name] DO Previous Signature: 10/13/2022 12:22:59 PM Version By: Karl Bales EMT Entered By: [name] on 10/13/2022 16:15:30 [name] ([id number]) [id number] Page 2 of 2 -------------------------------------------------------------------------------- HBO Safety Checklist Details Patient Name: Date of Service: [name], [name] 10/13/2022 10:00 A M Medical Record Number: [id number] Patient Account Number: [id number] Date of Birth/Sex: Treating RN: 1965/09/17 (57 y.o. Ardis Rowan, Lauren Primary Care : [name] Other Clinician: Karl Bales Referring : Treating /Extender: [name], [name]: 13 HBO Safety Checklist Items Safety Checklist Consent Form Signed Patient voided / foley secured and emptied When did you last eato 0700 Last dose of injectable or oral agent 0700 6 units Humalog Ostomy pouch emptied and vented if applicable NA All implantable devices assessed, documented and approved NA Intravenous access site secured and place NA Valuables secured Linens and cotton and cotton/polyester blend (less than 51% polyester) Personal oil-based products / skin lotions / body lotions removed Wigs or hairpieces removed Human Extensions approved Smoking or tobacco materials removed Books / newspapers / magazines / loose paper removed Cologne, aftershave, perfume and deodorant  removed Jewelry removed (may wrap wedding band) Make-up removed Hair care products removed Battery operated devices (external) removed Libre Heating patches and chemical warmers removed NA Titanium eyewear removed NA Nail polish cured greater than 10 hours Over 10 hours old Haematologist cured greater than 10 hours NA Hearing aids removed  NA Loose dentures or partials removed NA Prosthetics have been removed NA Patient demonstrates correct use of air break device (if applicable) Patient concerns have been addressed Patient grounding bracelet on and cord attached to chamber Specifics for Inpatients (complete in addition to above) Medication sheet sent with patient NA Intravenous medications needed or due during therapy sent with patient NA Drainage tubes (e.g. nasogastric tube or chest tube secured and vented) NA Endotracheal or Tracheotomy tube secured NA Cuff deflated of air and inflated with saline NA Airway suctioned NA Notes The Safety checklist was done before the treatment was started. Electronic Signature(s) Signed: 10/13/2022 12:21:18 PM By: Karl Bales EMT Entered By: Karl Bales on 10/13/2022 12:21:17

## 2022-10-13 NOTE — Progress Notes (Signed)
ELLIE, GOODFELLOW ([id number]) [id number] Page 1 of 2 Visit Report for 10/13/2022 Arrival Information Details Patient Name: Date of Service: [name] [name], [name] [name] 10/13/2022 10:00 A M Medical Record Number: [id number] Patient Account Number: [id number] Date of Birth/Sex: Treating RN: 26-Feb-1966 (57 y.o. Ardis Rowan, Lauren Primary Care : [name] Other Clinician: Karl Bales Referring : Treating /Extender: [name], [name]: 13 Visit Information History Since Last Visit All ordered tests and consults were completed: Yes Patient Arrived: Wheel Chair Added or deleted any medications: No Arrival Time: 09:20 Any new allergies or adverse reactions: No Accompanied By: Son Had a fall or experienced change in No Transfer Assistance: None activities of daily living that may affect Patient Identification Verified: Yes risk of falls: Secondary Verification Process Completed: Yes Signs or symptoms of abuse/neglect since last visito No Patient Requires Transmission-Based Precautions: No Hospitalized since last visit: No Patient Has Alerts: No Implantable device outside of the clinic excluding No cellular tissue based products placed in the center since last visit: Pain Present Now: No Electronic Signature(s) Signed: 10/13/2022 12:18:52 PM By: Karl Bales EMT Entered By: Karl Bales on 10/13/2022 12:18:51 -------------------------------------------------------------------------------- Encounter Discharge Information Details Patient Name: Date of Service: [name] Milo D. 10/13/2022 10:00 A M Medical Record Number: [id number] Patient Account Number: [id number] Date of Birth/Sex: Treating RN: [birth year] (57 y.o. Ardis Rowan, Lauren Primary Care : [name] Other Clinician: Karl Bales Referring : Treating /Extender: Caleen Essex GESH Weeks in  Treatment: 45 Encounter Discharge Information Items Discharge Condition: Stable Ambulatory Status: Wheelchair Discharge Destination: Home Transportation: Private Auto Accompanied By: Clearence Cheek Schedule Follow-up Appointment: No Clinical Summary of Care: Electronic Signature(s) Signed: 10/13/2022 12:24:17 PM By: Karl Bales EMT Entered By: Karl Bales on 10/13/2022 12:24:17 [name] ([id number]) [id number] Page 2 of 2 -------------------------------------------------------------------------------- Vitals Details Patient Name: Date of Service: [name] [name], [name] [name] 10/13/2022 10:00 A M Medical Record Number: [id number] Patient Account Number: [id number] Date of Birth/Sex: Treating RN: Feb 02, 1966 (57 y.o. Ardis Rowan, Lauren Primary Care : [name] Other Clinician: Karl Bales Referring : Treating /Extender: [name], [name]: 13 Vital Signs Time Taken: 09:33 Temperature (F): 97.2 Height (in): 65 Pulse (bpm): 69 Weight (lbs): 105 Respiratory Rate (breaths/min): 18 Body Mass Index (BMI): 17.5 Blood Pressure (mmHg): 134/78 Capillary Blood Glucose (mg/dl): 063 Reference Range: 80 - 120 mg / dl Electronic Signature(s) Signed: 10/13/2022 12:19:21 PM By: Karl Bales EMT Entered By: Karl Bales on 10/13/2022 12:19:21

## 2022-10-14 ENCOUNTER — Encounter (HOSPITAL_BASED_OUTPATIENT_CLINIC_OR_DEPARTMENT_OTHER): Payer: BC Managed Care – PPO | Admitting: Internal Medicine

## 2022-10-14 NOTE — Progress Notes (Signed)
SHUNTIA, COPAS ([id number]) [id number] Page 1 of 2 Visit Report for 10/13/2022 Problem List Details Patient Name: Date of Service: [name] [name], [name] [name] 10/13/2022 10:00 A M Medical Record Number: [id number] Patient Account Number: [id number] Date of Birth/Sex: Treating RN: 10-27-1965 (57 y.o. 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 Ardis Rowan, Lauren Primary Care Provider: [name] Other Clinician: Karl Bales Referring Provider: Treating Provider/Extender: [name], [name]: 13 Diagnosis Coding ICD-10 Codes Code Description [phone] Non-pressure chronic ulcer of other part of right foot with necrosis of bone M86.671 Other chronic osteomyelitis, right ankle and foot E10.621 Type 1 diabetes mellitus with foot ulcer Facility Procedures KOURTNY, GUZZETTA ([id number]): CPT4 Code Description Modifier [id number]-(Facility Use Only) HBOT full body  chamber, , ICD-10 Diagnosis Description E10.621 Type 1 diabetes mellitus with foot ulcer L97.514 Non-pressure chronic ulcer of other part  of right foot with necrosis o M86.671 Other chronic osteomyelitis, right ankle and foot [id number] Page 2 of 2: Quantity 4 f bone Physician Procedures : CPT4 Code Description Modifier [phone] - WC PHYS HYPERBARIC OXYGEN THERAPY ICD-10 Diagnosis Description E10.621 Type 1 diabetes mellitus with foot ulcer 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

## 2022-10-15 NOTE — Progress Notes (Signed)
Subjective: Chief Complaint  Patient presents with   Foot Ulcer    Follow up ulcer heel right    "Its doing so much better, just a little pin point now"  LEFT - foot has been swelling a lot for about couple months, gets so bigger in the afternoon and causing a lot of pain    57 year old female presents the office for above concerns.  States that the wound on the right side is been doing great.  She just saw wound care and had a graft applied.  She is still try to be nonweightbearing.  No fevers or chills patient having some swelling to the left foot.  No injuries.  No significant pain.  Objective: AAO x3, NAD DP/PT pulses palpable bilaterally, CRT less than 3 seconds Not able to evaluate the wound on the right but there is a graft which was applied yesterday so I did not remove this.  However, I can see there is no surrounding erythema, ascending cellulitis.  There is no drainage on the bandage.  On the left foot there is some swelling present but there is no increased temperature or warmth or redness.  There is no area pinpoint tenderness. No pain with calf compression, swelling, warmth, erythema  Assessment: 57 year old female with right heel ulceration, osteomyelitis, calcaneal fracture  Plan: -All treatment options discussed with the patient including all alternatives, risks, complications.  -X-rays obtained and reviewed.  3 views of the feet were obtained bilaterally.  On the right side the area of the fracture, infection to the posterior calcaneus appears to be consolidating.  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 -Continue follow-up with the wound care center on the right side.  She also follows with infectious disease.  She seems to doing much better.  Continue offloading. -For the left side discussed compression, elevation.  Continue supportive shoe gear.  I do think a lot of this coming from compensation given  nonweightbearing on the right side. -Monitor for any clinical signs or symptoms of infection and directed to call the office immediately should any occur or go to the ER.  Return in about 3 weeks (around 10/28/2022).  [name] DPM

## 2022-10-20 ENCOUNTER — Other Ambulatory Visit: Payer: Medicare Other

## 2022-10-21 ENCOUNTER — Encounter (HOSPITAL_BASED_OUTPATIENT_CLINIC_OR_DEPARTMENT_OTHER): Payer: BC Managed Care – PPO | Admitting: Internal Medicine

## 2022-10-25 NOTE — Progress Notes (Signed)
[name] [name] ([id number]) [id number] Page 1 of 9 Visit Report for 10/11/2022 Arrival Information Details Patient Name: Date of Service: [name] [name], [name] [name] 10/11/2022 12:30 PM Medical Record Number: [id number] Patient Account Number: [id number] Date of Birth/Sex: Treating RN: Jul 04, 1965 (57 y.o. Female) Brenton Grills Primary Care Chenel Wernli: [name] Other Clinician: Referring Ipek Westra: Treating Brelee Renk/Extender: [name], [name]: 13 Visit Information History Since Last Visit All ordered tests and consults were completed: Yes Patient Arrived: Wheel Chair Added or deleted any medications: No Arrival Time: 12:36 Any new allergies or adverse reactions: No Accompanied By: self Had a fall or experienced change in No Transfer Assistance: None activities of daily living that may affect Patient Identification Verified: Yes risk of falls: Secondary Verification Process Completed: Yes Signs or symptoms of abuse/neglect since last visito No Patient Requires Transmission-Based Precautions: No Hospitalized since last visit: No Patient Has Alerts: No Implantable device outside of the clinic excluding No cellular tissue based products placed in the center since last visit: Has Dressing in Place as Prescribed: Yes Pain Present Now: No Electronic Signature(s) Signed: 10/25/2022 2:03:55 PM By: Brenton Grills Entered By: Brenton Grills on 10/11/2022 12:36:51 -------------------------------------------------------------------------------- Clinic Level of Care Assessment Details Patient Name: Date of Service: [name] [name], [name] [name] 10/11/2022 12:30 PM Medical Record Number: [id number] Patient Account Number: [id number] Date of Birth/Sex: Treating RN: [birth year] (57 y.o. Female) Brenton Grills Primary Care Julaine Zimny: [name] Other Clinician: Referring Stehanie Ekstrom: Treating Antinette Keough/Extender: [name], YO  GESH Weeks in Treatment: 13 Clinic Level of Care Assessment Items TOOL 4 Quantity Score X- 1 0 Use when only an EandM is performed on FOLLOW-UP visit ASSESSMENTS - Nursing Assessment / Reassessment X- 1 10 Reassessment of Co-morbidities (includes updates in patient status) X- 1 5 Reassessment of Adherence to Treatment Plan ASSESSMENTS - Wound and Skin A ssessment / Reassessment X - Simple Wound Assessment / Reassessment - one wound 1 5 X- 1 5 Complex Wound Assessment / Reassessment - multiple wounds X- 1 10 Dermatologic / Skin Assessment (not related to wound area) ASSESSMENTS - Focused Assessment X- 1 5 Circumferential Edema Measurements - multi extremities []  - 0 Nutritional Assessment / Counseling / Intervention [name] [name] ([id number]) [id number] Page 2 of 9 []  - 0 Lower Extremity Assessment (monofilament, tuning fork, pulses) []  - 0 Peripheral Arterial Disease Assessment (using hand held doppler) ASSESSMENTS - Ostomy and/or Continence Assessment and Care []  - 0 Incontinence Assessment and Management []  - 0 Ostomy Care Assessment and Management (repouching, etc.) PROCESS - Coordination of Care X - Simple Patient / Family Education for ongoing care 1 15 []  - 0 Complex (extensive) Patient / Family Education for ongoing care X- 1 10 Staff obtains Chiropractor, Records, T Results / Process Orders est []  - 0 Staff telephones HHA, Nursing Homes / Clarify orders / etc []  - 0 Routine Transfer to another Facility (non-emergent condition) []  - 0 Routine Hospital Admission (non-emergent condition) []  - 0 New Admissions / Manufacturing engineer / Ordering NPWT Apligraf, etc. , []  - 0 Emergency Hospital Admission (emergent condition) X- 1 10 Simple Discharge Coordination []  - 0 Complex (extensive) Discharge Coordination PROCESS - Special Needs []  - 0 Pediatric / Minor Patient Management []  - 0 Isolation Patient Management []  - 0 Hearing  / Language / Visual special needs []  - 0 Assessment of Community assistance (transportation, D/C planning, etc.) []  - 0 Additional assistance / Altered mentation []  - 0 Support Surface(s) Assessment (bed, cushion,  seat, etc.) INTERVENTIONS - Wound Cleansing / Measurement X - Simple Wound Cleansing - one wound 1 5 []  - 0 Complex Wound Cleansing - multiple wounds X- 1 5 Wound Imaging (photographs - any number of wounds) []  - 0 Wound Tracing (instead of photographs) X- 1 5 Simple Wound Measurement - one wound []  - 0 Complex Wound Measurement - multiple wounds INTERVENTIONS - Wound Dressings X - Small Wound Dressing one or multiple wounds 1 10 []  - 0 Medium Wound Dressing one or multiple wounds []  - 0 Large Wound Dressing one or multiple wounds X- 1 5 Application of Medications - topical []  - 0 Application of Medications - injection INTERVENTIONS - Miscellaneous []  - 0 External ear exam []  - 0 Specimen Collection (cultures, biopsies, blood, body fluids, etc.) []  - 0 Specimen(s) / Culture(s) sent or taken to Lab for analysis []  - 0 Patient Transfer (multiple staff / Nurse, adult / Similar devices) []  - 0 Simple Staple / Suture removal (25 or less) []  - 0 Complex Staple / Suture removal (26 or more) []  - 0 Hypo / Hyperglycemic Management (close monitor of Blood Glucose) [name] [name], [name] [name] ([id number]) [id number] Page 3 of 9 []  - 0 Ankle / Brachial Index (ABI) - do not check if billed separately []  - 0 Vital Signs Has the patient been seen at the hospital within the last three years: Yes Total Score: 105 Level Of Care: New/Established - Level 3 Electronic Signature(s) Signed: 10/25/2022 2:03:55 PM By: Brenton Grills Entered By: Brenton Grills on 10/11/2022 13:03:15 -------------------------------------------------------------------------------- Encounter Discharge Information Details Patient Name: Date of Service: [name] Milo D. 10/11/2022  12:30 PM Medical Record Number: [id number] Patient Account Number: [id number] Date of Birth/Sex: Treating RN: 1965-10-27 (57 y.o. Female) Brenton Grills Primary Care Geddy Boydstun: [name] Other Clinician: Referring Stylianos Stradling: Treating Gwendolyn Nishi/Extender: [name], [name]: 61 Encounter Discharge Information Items Discharge Condition: Stable Ambulatory Status: Wheelchair Discharge Destination: Home Transportation: Private Auto Accompanied By: self Schedule Follow-up Appointment: Yes Clinical Summary of Care: Patient Declined Electronic Signature(s) Signed: 10/25/2022 2:03:55 PM By: Brenton Grills Entered By: Brenton Grills on 10/11/2022 13:06:38 -------------------------------------------------------------------------------- Lower Extremity Assessment Details Patient Name: Date of Service: [name] [name], [name] [name] 10/11/2022 12:30 PM Medical Record Number: [id number] Patient Account Number: [id number] Date of Birth/Sex: Treating RN: 05-Dec-1965 (57 y.o. Female) Brenton Grills Primary Care Olegario Emberson: [name] Other Clinician: Referring Nyoka Alcoser: Treating Dalina Samara/Extender: [name], [name]: 13 Edema Assessment Assessed: [Left: No] [Right: No] Edema: [Left: N] [Right: o] Calf Left: Right: Point of Measurement: From Medial Instep 26.5 cm Ankle Left: Right: Point of Measurement: From Medial Instep 19.4 cm Vascular Assessment Left: [[id number] Page 4 of 9Right:] Pulses: Dorsalis Pedis Palpable: [[id number] Page 4 of 9Yes] Extremity colors, hair growth, and conditions: Extremity Color: [[id number] Page 4 of 9Normal] Hair Growth on Extremity: [[id number] Page 4 of 9No] Temperature of Extremity: [[id number] Page 4 of 9Warm] Capillary Refill:  [id number] Page 4 of 9< 3 seconds] Dependent Rubor: [[id number] Page 4 of 9No No] Toe Nail Assessment Left: Right: Thick: No Discolored: No Deformed: No Improper Length and Hygiene: No Electronic Signature(s) Signed: 10/25/2022 2:03:55 PM By: Brenton Grills Entered By: Brenton Grills on 10/11/2022 12:40:18 -------------------------------------------------------------------------------- Multi Wound Chart Details Patient Name: Date of Service: [name] Milo D. 10/11/2022 12:30 PM Medical Record Number: [id number] Patient Account Number: [id number] Date of Birth/Sex: Treating RN: October 24, 1965 (57 y.o. Female) Primary Care Zurisadai Helminiak: [name] Other Clinician: Referring  Jetty Berland: Treating Jenai Scaletta/Extender: [name], [name]: 13 Vital Signs Height(in): 65 Capillary Blood Glucose(mg/dl): 409 Weight(lbs): 811 Pulse(bpm): 77 Body Mass Index(BMI): 17.5 Blood Pressure(mmHg): 133/80 Temperature(F): 97.7 Respiratory Rate(breaths/min): 18 [4:Photos:] [N/A:N/A] Right Calcaneus N/A N/A Wound Location: Gradually Appeared N/A N/A Wounding Event: Diabetic Wound/Ulcer of the Lower N/A N/A Primary Etiology: Extremity Hypertension, Type I Diabetes, N/A N/A Comorbid History: Osteoarthritis, Osteomyelitis, Neuropathy 05/30/2022 N/A N/A Date Acquired: 13 N/A N/A Weeks of Treatment: Open N/A N/A Wound Status: No N/A N/A Wound Recurrence: 0.1x0.1x0.1 N/A N/A Measurements L x W x D (cm) 0.008 N/A N/A A (cm) : rea 0.001 N/A N/A Volume (cm) : 99.90% N/A N/A % Reduction in A rea: 100.00% N/A N/A % Reduction in Volume: Grade 2 N/A N/A Classification: Medium N/A N/A Exudate A mountREHAM, [name] [name] ([id number]) [id number] Page 5 of 9 Serosanguineous N/A N/A Exudate Type: red, brown N/A N/A Exudate Color: Distinct, outline attached N/A N/A Wound Margin: Large  (67-100%) N/A N/A Granulation Amount: Red N/A N/A Granulation Quality: None Present (0%) N/A N/A Necrotic Amount: Fat Layer (Subcutaneous Tissue): Yes N/A N/A Exposed Structures: Fascia: No Tendon: No Muscle: No Joint: No Bone: No Medium (34-66%) N/A N/A Epithelialization: No Abnormalities Noted N/A N/A Periwound Skin Texture: Maceration: Yes N/A N/A Periwound Skin Moisture: No Abnormalities Noted N/A N/A Periwound Skin Color: Hot N/A N/A Temperature: Treatment Notes Wound #4 (Calcaneus) Wound Laterality: Right Cleanser Peri-Wound Care Topical Primary Dressing Xeroform Occlusive Gauze Dressing, 4x4 in Discharge Instruction: Apply to wound bed as instructed Secondary Dressing Zetuvit Plus Silicone Border Dressing 5x5 (in/in) Discharge Instruction: Apply silicone border over primary dressing as directed. Secured With Compression Wrap Compression Stockings Add-Ons Electronic Signature(s) Signed: 10/11/2022 4:37:08 PM By: [name] DO Entered By: [name] on 10/11/2022 13:40:22 -------------------------------------------------------------------------------- Multi-Disciplinary Care Plan Details Patient Name: Date of Service: [name] Milo D. 10/11/2022 12:30 PM Medical Record Number: [id number] Patient Account Number: [id number] Date of Birth/Sex: Treating RN: 1965-05-15 (57 y.o. Female) Brenton Grills Primary Care Amayia Ciano: [name] Other Clinician: Referring Freddie Nghiem: Treating Arianis Bowditch/Extender: [name], [name]: 13 Active Inactive HBO Nursing Diagnoses: Anxiety related to feelings of confinement associated with the hyperbaric oxygen chamber Anxiety related to knowledge deficit of hyperbaric oxygen therapy and treatment procedures Potential for barotraumas to ears, sinuses, teeth, and lungs or cerebral gas embolism related to changes in atmospheric pressure inside hyperbaric oxygen chamber Potential for  oxygen toxicity seizures related to delivery of 100% oxygen at an increased atmospheric pressure JASSMINE, PARROTTA ([id number]) [phone].pdf Page 6 of 9 Potential for pulmonary oxygen toxicity related to delivery of 100% oxygen at an increased atmospheric pressure Goals: Patient and/or family will be able to state/discuss factors appropriate to the management of their disease process during treatment Date Initiated: 07/12/2022 Target Resolution Date: 09/30/2022 Goal Status: Active Patient will tolerate the hyperbaric oxygen therapy treatment Date Initiated: 07/12/2022 Target Resolution Date: 09/23/2022 Goal Status: Active Patient/caregiver will verbalize understanding of HBO goals, rationale, procedures and potential hazards Date Initiated: 07/12/2022 Date Inactivated: 08/12/2022 Target Resolution Date: 08/09/2022 Goal Status: Met Interventions: Administer a five (5) minute air break for patient if signs and symptoms of seizure appear and notify the hyperbaric physician Administer a ten (10) minute air break for patient if signs and symptoms of seizure appear and notify the hyperbaric physician Administer decongestants, per physician orders, prior to HBO2 Administer the correct therapeutic gas delivery based on the patients needs and limitations, per  physician order Assess and provide for patients comfort related to the hyperbaric environment and equalization of middle ear Assess for signs and symptoms related to adverse events, including but not limited to confinement anxiety, pneumothorax, oxygen toxicity and baurotrauma Assess patient for any history of confinement anxiety Assess patient's knowledge and expectations regarding hyperbaric medicine and provide education related to the hyperbaric environment, goals of treatment and prevention of adverse events Implement protocols to decrease risk of pneumothorax in high risk patients Notes: Nutrition Nursing  Diagnoses: Imbalanced nutrition Impaired glucose control: actual or potential Goals: Patient/caregiver verbalizes understanding of need to maintain therapeutic glucose control per primary care physician Date Initiated: 07/12/2022 Target Resolution Date: 08/22/2022 Goal Status: Active Patient/caregiver will maintain therapeutic glucose control Date Initiated: 07/12/2022 Target Resolution Date: 09/23/2022 Goal Status: Active Interventions: Assess HgA1c results as ordered upon admission and as needed Assess patient nutrition upon admission and as needed per policy Provide education on elevated blood sugars and impact on wound healing Provide education on nutrition Treatment Activities: Education provided on Nutrition : 07/12/2022 Notes: Osteomyelitis Nursing Diagnoses: Infection: osteomyelitis Knowledge deficit related to disease process and management Goals: Patient/caregiver will verbalize understanding of disease process and disease management Date Initiated: 07/12/2022 Date Inactivated: 08/12/2022 Target Resolution Date: 08/18/2022 Goal Status: Met Patient's osteomyelitis will resolve Date Initiated: 07/12/2022 Target Resolution Date: 10/28/2022 Goal Status: Active Signs and symptoms for osteomyelitis will be recognized and promptly addressed Date Initiated: 07/12/2022 Target Resolution Date: 09/30/2022 Goal Status: Active Interventions: Assess for signs and symptoms of osteomyelitis resolution every visit Provide education on osteomyelitis Notes: Electronic Signature(s) [name] [name], [name] [name] ([id number]) [id number] Page 7 of 9 Signed: 10/25/2022 2:03:55 PM By: Brenton Grills Entered By: Brenton Grills on 10/11/2022 12:46:50 -------------------------------------------------------------------------------- Pain Assessment Details Patient Name: Date of Service: [name] [name], [name] [name] 10/11/2022 12:30 PM Medical Record Number: [id number] Patient Account Number:  [id number] Date of Birth/Sex: Treating RN: July 30, 1965 (57 y.o. Female) Brenton Grills Primary Care Majorie Santee: [name] Other Clinician: Referring Marx Doig: Treating Signa Cheek/Extender: [name], [name]: 13 Active Problems Location of Pain Severity and Description of Pain Patient Has Paino No Site Locations Pain Management and Medication Current Pain Management: Electronic Signature(s) Signed: 10/25/2022 2:03:55 PM By: Brenton Grills Entered By: Brenton Grills on 10/11/2022 12:39:51 -------------------------------------------------------------------------------- Patient/Caregiver Education Details Patient Name: Date of Service: [name] [name] 8/13/2024andnbsp12:30 PM Medical Record Number: [id number] Patient Account Number: [id number] Date of Birth/Gender: Treating RN: Oct 25, 1965 (57 y.o. Female) Brenton Grills Primary Care Physician: [name] Other Clinician: Referring Physician: Treating Physician/Extender: Yates Decamp Weeks in Treatment: 13 Education Assessment Education Provided To: Patient [name] [name], [name] [name] ([id number]) [id number] Page 8 of 9 Education Topics Provided Wound/Skin Impairment: Methods: Explain/Verbal Responses: State content correctly Electronic Signature(s) Signed: 10/25/2022 2:03:55 PM By: Brenton Grills Entered By: Brenton Grills on 10/11/2022 12:47:08 -------------------------------------------------------------------------------- Wound Assessment Details Patient Name: Date of Service: [name] [name], [name] [name] 10/11/2022 12:30 PM Medical Record Number: [id number] Patient Account Number: [id number] Date of Birth/Sex: Treating RN: [birth year] (57 y.o. Female) Brenton Grills Primary Care Neshia Mckenzie: [name] Other Clinician: Referring Jillann Charette: Treating Citlali Gautney/Extender: [name], [name]: 13 Wound Status Wound Number: 4  Primary Diabetic Wound/Ulcer of the Lower Extremity Etiology: Wound Location: Right Calcaneus Wound Status: Open Wounding Event: Gradually Appeared Comorbid Hypertension, Type I Diabetes, Osteoarthritis, Osteomyelitis, Date Acquired: 05/30/2022 History: Neuropathy Weeks Of Treatment: 13 Clustered Wound: No Photos Wound Measurements Length: (cm) 0.1 Width: (cm) 0.1 Depth: (  cm) 0.1 Area: (cm) 0.008 Volume: (cm) 0.001 % Reduction in Area: 99.9% % Reduction in Volume: 100% Epithelialization: Medium (34-66%) Tunneling: No Undermining: No Wound Description Classification: Grade 2 Wound Margin: Distinct, outline attached Exudate Amount: Medium Exudate Type: Serosanguineous Exudate Color: red, brown Foul Odor After Cleansing: No Slough/Fibrino No Wound Bed Granulation Amount: Large (67-100%) Exposed Structure Granulation Quality: Red Fascia Exposed: No Necrotic Amount: None Present (0%) Fat Layer (Subcutaneous Tissue) Exposed: Yes Tendon Exposed: No Muscle Exposed: No Joint Exposed: No Bone Exposed: No [name] [name] [name], [name] [name] ([id number]) [id number] Page 9 of 9 Texture Color No Abnormalities Noted: Yes No Abnormalities Noted: Yes Moisture Temperature / Pain No Abnormalities Noted: No Temperature: Hot Maceration: Yes Treatment Notes Wound #4 (Calcaneus) Wound Laterality: Right Cleanser Peri-Wound Care Topical Primary Dressing Xeroform Occlusive Gauze Dressing, 4x4 in Discharge Instruction: Apply to wound bed as instructed Secondary Dressing Zetuvit Plus Silicone Border Dressing 5x5 (in/in) Discharge Instruction: Apply silicone border over primary dressing as directed. Secured With Compression Wrap Compression Stockings Add-Ons Electronic Signature(s) Signed: 10/25/2022 2:03:55 PM By: Brenton Grills Entered By: Brenton Grills on 10/11/2022  12:46:11 -------------------------------------------------------------------------------- Vitals Details Patient Name: Date of Service: [name] Milo D. 10/11/2022 12:30 PM Medical Record Number: [id number] Patient Account Number: [id number] Date of Birth/Sex: Treating RN: May 24, 1965 (57 y.o. Female) Brenton Grills Primary Care Ronnie Doo: [name] Other Clinician: Referring Claritza July: Treating Antwon Rochin/Extender: [name], [name]: 13 Vital Signs Time Taken: 12:30 Temperature (F): 97.7 Height (in): 65 Pulse (bpm): 77 Weight (lbs): 105 Respiratory Rate (breaths/min): 18 Body Mass Index (BMI): 17.5 Blood Pressure (mmHg): 133/80 Capillary Blood Glucose (mg/dl): 086 Reference Range: 80 - 120 mg / dl Electronic Signature(s) Signed: 10/25/2022 2:03:55 PM By: Brenton Grills Entered By: Brenton Grills on 10/11/2022 12:39:42

## 2022-10-27 ENCOUNTER — Encounter (HOSPITAL_BASED_OUTPATIENT_CLINIC_OR_DEPARTMENT_OTHER): Payer: BC Managed Care – PPO | Admitting: Internal Medicine

## 2022-10-27 DIAGNOSIS — E10621 Type 1 diabetes mellitus with foot ulcer: Secondary | ICD-10-CM

## 2022-10-27 DIAGNOSIS — L97514 Non-pressure chronic ulcer of other part of right foot with necrosis of bone: Secondary | ICD-10-CM

## 2022-10-27 DIAGNOSIS — M86671 Other chronic osteomyelitis, right ankle and foot: Secondary | ICD-10-CM | POA: Diagnosis not present

## 2022-10-27 NOTE — Progress Notes (Signed)
[name] [name], [name] [name] ([id number]) [id number] Page 1 of 8 Visit Report for 10/27/2022 Chief Complaint Document Details Patient Name: Date of Service: [name] [name], [name] [name] 10/27/2022 10:30 A M Medical Record Number: [id number] Patient Account Number: [id number] Date of Birth/Sex: Treating RN: 05/06/1965 (57 y.o. F) Primary Care Provider: [name] Other Clinician: Referring Provider: Treating Provider/Extender: [name], [name]: 15 Information Obtained from: Patient Chief Complaint 07/12/2022; right calcaneus wound with chronic osteomyelitis Electronic Signature(s) Signed: 10/27/2022 4:50:14 PM By: [name] DO Entered By: [name] on 10/27/2022 14:07:05 -------------------------------------------------------------------------------- HPI Details Patient Name: Date of Service: [name] Milo D. 10/27/2022 10:30 A M Medical Record Number: [id number] Patient Account Number: [id number] Date of Birth/Sex: Treating RN: October 25, 1965 (56 y.o. F) Primary Care Provider: [name] Other Clinician: Referring Provider: Treating Provider/Extender: [name], [name]: 15 History of Present Illness ssociated Signs and Symptoms: Patient has a history of diabetes mellitus type II Charcot foot chronic kidney disease stage III hypertension, and right A second toe amputation January 2017 due to osteomyelitis. 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 She states she developed the wound while in the hospital after her having lumbar fusion surgery. She has been following with Dr. Ronda Fairly at [facility] wound care center for her foot wound. She has been  using Dakin's wet-to-dry dressings and Prevalon boot to help with offloading. Unfortunately the wound did probe to bone and biopsy with cultures were obtained on 06/22/2022. The right calcaneus bone biopsy showed chronic osteomyelitis. Cultures obtained showed mixed [name]. Her C-reactive protein 3 weeks ago was 13.6 and sed rate of 58. She is following with Dr. Silvestre Mesi, infectious disease and she is currently on daptomycin and ciprofloxacin with plan to continue daptomycin through May 22 then transition to p.o. therapy with plan for total of 6-week course of antibiotic treatment. Patient has been evaluated with arterial studies. She had right normal pressures, pulse volume recordings, ankle-brachial and toe brachial index. Digital pulse volume recordings and pressures suggest adequate arterial blood flow for healing of distal wounds. Currently she denies signs of infection. For her uncontrolled type 2 diabetes she recently started the freestyle libre 3 Follows with endocrinology. 5/23; patient presents for follow-up. She has been using Dakin's wet-to-dry dressings. She started HBO therapy on Monday and has tolerated this well. She has no issues or complaints today. Her Antibiotic infusions were stopped and she was transitioned to p.o. antibiotics by infectious disease as planned. She denies signs of infection. 6/3; right heel wound and a type I diabetic with underlying osteomyelitis. She is currently being treated with HBO. She offloads the wound and a offloading boot provided by orthopedics. Her dimensions today were improved including the wound depth. She is tolerating HBO well. 6/14; patient presents for follow-up. She has been using Dakin's wet-to-dry dressings to the wound bed. She has been doing HBO therapy for Wagner grade 3 on the right foot. She has tolerated the treatments well. Her wound is smaller. 6/24; patient presents for follow-up. She has been using Dakin's wet-to-dry dressings to the  wound bed. She continues to complete hyperbaric oxygen therapy sessions without issues. Her wound is smaller. 7/1; patient presents for follow-up. She has been using Hydrofera Blue to  the wound bed. Wound is smaller. We discussed using a skin substitute and patient was agreeable to proceed with insurance verification for this. She continues with hyperbaric oxygen therapy without issues. She is using an offloading boot. 7/9; patient presents for follow-up. She has been using Hydrofera Blue to the wound bed without issues. She continues with hyperbaric oxygen therapy. Her [name] [name], [name] [name] ([id number]) [phone].pdf Page 2 of 8 wound is smaller. 7/16; patient presents for follow-up. She has been using Hydrofera Blue to the wound bed. She continues with hyperbaric oxygen therapy. We have been able to extend her for 20 more dives since her wound is not closed yet But has shown significant improvement with wound care and HBO therapy. 7/23; patient presents for follow-up. She has been using Hydrofera Blue to the wound bed. She has been approved for Apligraf and we had this in office to be placed. Patient was agreeable to move forward with this. She currently denies signs of infection. She has been tolerating hyperbaric oxygen therapy well. 7/30; patient presents for follow-up. Apligraf was placed at last clinic visit. She tolerated this well. Wound is smaller. She continues hyperbaric oxygen therapy without issues. 8/6; patient presents for follow-up. We have been using Apligraf to the wound bed. Wound is smaller. She continues hyperbaric oxygen therapy without issues. 8/13; patient presents for follow-up. Apligraf was placed in standard fashion last clinic visit. Wound is now a pinpoint. She continues hyperbaric oxygen therapy and she is scheduled to finish her 60 treatments this week. 8/29; patient presents for follow-up. She has been using Xeroform daily. She has completed  hyperbaric oxygen therapy. Her wound has healed. She still wearing her offloading boot. She follows up with podiatry next week. Electronic Signature(s) Signed: 10/27/2022 4:50:14 PM By: [name] DO Entered By: [name] on 10/27/2022 14:08:06 -------------------------------------------------------------------------------- Physical Exam Details Patient Name: Date of Service: [name] [name], [name] [name] 10/27/2022 10:30 A M Medical Record Number: [id number] Patient Account Number: [id number] Date of Birth/Sex: Treating RN: [birth year] (57 y.o. F) Primary Care Provider: [name] Other Clinician: Referring Provider: Treating Provider/Extender: [name], [name]: 15 Constitutional respirations regular, non-labored and within target range for patient.. Cardiovascular 2+ dorsalis pedis/posterior tibialis pulses. Psychiatric pleasant and cooperative. Notes T the right lateral calcaneus there is epithelization to the previous wound site. o Electronic Signature(s) Signed: 10/27/2022 4:50:14 PM By: [name] DO Entered By: [name] on 10/27/2022 14:08:46 -------------------------------------------------------------------------------- Physician Orders Details Patient Name: Date of Service: [name] Milo D. 10/27/2022 10:30 A M Medical Record Number: [id number] Patient Account Number: [id number] Date of Birth/Sex: Treating RN: Jul 20, 1965 (57 y.o. [name] [name] Primary Care Provider: [name] Other Clinician: Referring Provider: Treating Provider/Extender: [name], [name]: 43 Verbal / Phone Orders: No [address] AJAYSIA, TOMASI ([id number]) [id number] Page 3 of 8 Discharge From [facility] Services Discharge from Wound Care Center - Congratulations! Your wound is healed! Electronic Signature(s) Signed: 10/27/2022 4:50:14 PM By: [name]  DO Entered By: [name] on 10/27/2022 14:09:03 -------------------------------------------------------------------------------- Problem List Details Patient Name: Date of Service: [name] Milo D. 10/27/2022 10:30 A M Medical Record Number: [id number] Patient Account Number: [id number] Date of Birth/Sex: Treating RN: Sep 05, 1965 (57 y.o. F) Primary Care Provider: [name] Other Clinician: Referring Provider: Treating Provider/Extender: [name], [name]: 15 Active Problems ICD-10 Encounter Code Description Active Date MDM Diagnosis L97.514 Non-pressure chronic ulcer of other part  of right foot with necrosis of bone 07/12/2022 No Yes M86.671 Other chronic osteomyelitis, right ankle and foot 07/12/2022 No Yes E10.621 Type 1 diabetes mellitus with foot ulcer 07/12/2022 No Yes Inactive Problems Resolved Problems Electronic Signature(s) Signed: 10/27/2022 4:50:14 PM By: [name] DO Entered By: [name] on 10/27/2022 14:06:50 -------------------------------------------------------------------------------- Progress Note Details Patient Name: Date of Service: [name] Milo D. 10/27/2022 10:30 A M Medical Record Number: [id number] Patient Account Number: [id number] Date of Birth/Sex: Treating RN: 1965/08/01 (57 y.o. F) Primary Care Provider: [name] Other Clinician: Referring Provider: Treating Provider/Extender: [name], [name]: 15 Subjective Chief Complaint Information obtained from Patient 07/12/2022; right calcaneus wound with chronic osteomyelitis [name] [name], [name] [name] ([id number]) [id number] Page 4 of 8 History of Present Illness (HPI) The following HPI elements were documented for the patient's wound: Associated Signs and Symptoms: Patient has a history of diabetes mellitus type II Charcot foot chronic kidney disease stage III hypertension, and  right second toe amputation January 2017 due to osteomyelitis. 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 She states she developed the wound while in the hospital after her having lumbar fusion surgery. She has been following with Dr. Ronda Fairly at [facility] wound care center for her foot wound. She has been using Dakin's wet-to-dry dressings and Prevalon boot to help with offloading. Unfortunately the wound did probe to bone and biopsy with cultures were obtained on 06/22/2022. The right calcaneus bone biopsy showed chronic osteomyelitis. Cultures obtained showed mixed [name]. Her C-reactive protein 3 weeks ago was 13.6 and sed rate of 58. She is following with Dr. Silvestre Mesi, infectious disease and she is currently on daptomycin and ciprofloxacin with plan to continue daptomycin through May 22 then transition to p.o. therapy with plan for total of 6-week course of antibiotic treatment. Patient has been evaluated with arterial studies. She had right normal pressures, pulse volume recordings, ankle-brachial and toe brachial index. Digital pulse volume recordings and pressures suggest adequate arterial blood flow for healing of distal wounds. Currently she denies signs of infection. For her uncontrolled type 2 diabetes she recently started the freestyle libre 3 Follows with endocrinology. 5/23; patient presents for follow-up. She has been using Dakin's wet-to-dry dressings. She started HBO therapy on Monday and has tolerated this well. She has no issues or complaints today. Her Antibiotic infusions were stopped and she was transitioned to p.o. antibiotics by infectious disease as planned. She denies signs of infection. 6/3; right heel wound and a type I diabetic with underlying  osteomyelitis. She is currently being treated with HBO. She offloads the wound and a offloading boot provided by orthopedics. Her dimensions today were improved including the wound depth. She is tolerating HBO well. 6/14; patient presents for follow-up. She has been using Dakin's wet-to-dry dressings to the wound bed. She has been doing HBO therapy for Wagner grade 3 on the right foot. She has tolerated the treatments well. Her wound is smaller. 6/24; patient presents for follow-up. She has been using Dakin's wet-to-dry dressings to the wound bed. She continues to complete hyperbaric oxygen therapy sessions without issues. Her wound is smaller. 7/1; patient presents for follow-up. She has been using Hydrofera Blue to the wound bed. Wound is smaller. We discussed using a skin substitute and patient  was agreeable to proceed with insurance verification for this. She continues with hyperbaric oxygen therapy without issues. She is using an offloading boot. 7/9; patient presents for follow-up. She has been using Hydrofera Blue to the wound bed without issues. She continues with hyperbaric oxygen therapy. Her wound is smaller. 7/16; patient presents for follow-up. She has been using Hydrofera Blue to the wound bed. She continues with hyperbaric oxygen therapy. We have been able to extend her for 20 more dives since her wound is not closed yet But has shown significant improvement with wound care and HBO therapy. 7/23; patient presents for follow-up. She has been using Hydrofera Blue to the wound bed. She has been approved for Apligraf and we had this in office to be placed. Patient was agreeable to move forward with this. She currently denies signs of infection. She has been tolerating hyperbaric oxygen therapy well. 7/30; patient presents for follow-up. Apligraf was placed at last clinic visit. She tolerated this well. Wound is smaller. She continues hyperbaric oxygen therapy without issues. 8/6; patient  presents for follow-up. We have been using Apligraf to the wound bed. Wound is smaller. She continues hyperbaric oxygen therapy without issues. 8/13; patient presents for follow-up. Apligraf was placed in standard fashion last clinic visit. Wound is now a pinpoint. She continues hyperbaric oxygen therapy and she is scheduled to finish her 60 treatments this week. 8/29; patient presents for follow-up. She has been using Xeroform daily. She has completed hyperbaric oxygen therapy. Her wound has healed. She still wearing her offloading boot. She follows up with podiatry next week. Patient History Information obtained from Patient, Chart. Family History Cancer - Paternal Grandparents, Hypertension - Father, No family history of Diabetes, Heart Disease, Hereditary Spherocytosis, Kidney Disease, Lung Disease, Seizures, Stroke, Thyroid Problems, Tuberculosis. Social History Never smoker, Marital Status - Married, Alcohol Use - Moderate, Drug Use - No History, Caffeine Use - Daily - coffee. Medical History Eyes Denies history of Cataracts, Glaucoma, Optic Neuritis Ear/Nose/Mouth/Throat Denies history of Chronic sinus problems/congestion, Middle ear problems Hematologic/Lymphatic Denies history of Anemia, Hemophilia, Human Immunodeficiency Virus, Lymphedema, Sickle Cell Disease Respiratory Denies history of Aspiration, Asthma, Chronic Obstructive Pulmonary Disease (COPD), Pneumothorax, Sleep Apnea, Tuberculosis Cardiovascular Patient has history of Hypertension Denies history of Angina, Arrhythmia, Congestive Heart Failure, Coronary Artery Disease, Deep Vein Thrombosis, Hypotension, Myocardial Infarction, Peripheral Arterial Disease, Peripheral Venous Disease, Phlebitis, Vasculitis Gastrointestinal Denies history of Cirrhosis , Colitis, Crohns, Hepatitis A, Hepatitis B, Hepatitis C Endocrine Patient has history of Type I Diabetes - age 78 Denies history of Type II Diabetes Genitourinary Denies  history of End Stage Renal Disease Immunological Denies history of Lupus Erythematosus, Raynauds, Scleroderma Integumentary (Skin) Denies history of History of Burn Musculoskeletal Patient has history of Osteoarthritis, Osteomyelitis - right foot Denies history of Gout, Rheumatoid Arthritis [name] [name], [name] [name] ([id number]) [id number] Page 5 of 8 Neurologic Patient has history of Neuropathy Denies history of Dementia, Quadriplegia, Paraplegia, Seizure Disorder Oncologic Denies history of Received Chemotherapy, Received Radiation Psychiatric Denies history of Anorexia/bulimia, Confinement Anxiety Hospitalization/Surgery History - right 2nd toe amputation. - c-section. Medical A Surgical History Notes nd Constitutional Symptoms (General Health) hypothyroidism Gastrointestinal Gerd Endocrine hypothyroidism Genitourinary CKD stage 3 Objective Constitutional respirations regular, non-labored and within target range for patient.. Vitals Time Taken: 10:39 AM, Height: 65 in, Weight: 105 lbs, BMI: 17.5, Temperature: 98.3 F, Pulse: 75 bpm, Respiratory Rate: 18 breaths/min, Blood Pressure: 153/80 mmHg, Capillary Blood Glucose: 153 mg/dl. Cardiovascular 2+ dorsalis pedis/posterior tibialis pulses. Psychiatric pleasant and cooperative. General  Notes: T the right lateral calcaneus there is epithelization to the previous wound site. o Integumentary (Hair, Skin) Wound #4 status is Healed - Epithelialized. Original cause of wound was Gradually Appeared. The date acquired was: 05/30/2022. The wound has been in treatment 15 weeks. The wound is located on the Right Calcaneus. The wound measures 0cm length x 0cm width x 0cm depth; 0cm^2 area and 0cm^3 volume. There is no tunneling or undermining noted. There is a none present amount of drainage noted. The wound margin is distinct with the outline attached to the wound base. There is no granulation within the wound bed.  There is no necrotic tissue within the wound bed. The periwound skin appearance had no abnormalities noted for texture. The periwound skin appearance had no abnormalities noted for color. The periwound skin appearance exhibited: Dry/Scaly. The periwound skin appearance did not exhibit: Maceration. Periwound temperature was noted as No Abnormality. Assessment Active Problems ICD-10 Non-pressure chronic ulcer of other part of right foot with necrosis of bone Other chronic osteomyelitis, right ankle and foot Type 1 diabetes mellitus with foot ulcer Patient has done well with Xeroform and hyperbaric oxygen therapy. Her wound has healed. No further dressing or hyperbaric oxygen therapy needed. I recommended continuing her offloading shoe until she sees podiatry for further recommendations of foot wear. She may follow-up as needed. Plan Discharge From [facility] Services: Discharge from Wound Care Center - Congratulations! Your wound is healed! 1. Discharge from clinic due to closed wound 2. Follow-up as needed 3. Follow-up with podiatry [name] [name], [name] [name] ([id number]) [id number] Page 6 of 8 Electronic Signature(s) Signed: 10/27/2022 4:50:14 PM By: [name] DO Entered By: [name] on 10/27/2022 14:10:18 -------------------------------------------------------------------------------- HxROS Details Patient Name: Date of Service: [name] Milo D. 10/27/2022 10:30 A M Medical Record Number: [id number] Patient Account Number: [id number] Date of Birth/Sex: Treating RN: November 12, 1965 (57 y.o. F) Primary Care Provider: [name] Other Clinician: Referring Provider: Treating Provider/Extender: [name], [name]: 15 Information Obtained From Patient Chart Constitutional Symptoms (General Health) Medical History: Past Medical History Notes: hypothyroidism Eyes Medical History: Negative for: Cataracts; Glaucoma; Optic  Neuritis Ear/Nose/Mouth/Throat Medical History: Negative for: Chronic sinus problems/congestion; Middle ear problems Hematologic/Lymphatic Medical History: Negative for: Anemia; Hemophilia; Human Immunodeficiency Virus; Lymphedema; Sickle Cell Disease Respiratory Medical History: Negative for: Aspiration; Asthma; Chronic Obstructive Pulmonary Disease (COPD); Pneumothorax; Sleep Apnea; Tuberculosis Cardiovascular Medical History: Positive for: Hypertension Negative for: Angina; Arrhythmia; Congestive Heart Failure; Coronary Artery Disease; Deep Vein Thrombosis; Hypotension; Myocardial Infarction; Peripheral Arterial Disease; Peripheral Venous Disease; Phlebitis; Vasculitis Gastrointestinal Medical History: Negative for: Cirrhosis ; Colitis; Crohns; Hepatitis A; Hepatitis B; Hepatitis C Past Medical History Notes: Gerd Endocrine Medical History: Positive for: Type I Diabetes - age 61 Negative for: Type II Diabetes Past Medical History Notes: hypothyroidism Time with diabetes: age 24 Treated with: Insulin Blood sugar tested every day: Yes Tested : 3-4 times per day Blood sugar testing results: Breakfast: 200; Bedtime: [address] [name] [name], [name] [name] ([id number]) [id number] Page 7 of 8 Genitourinary Medical History: Negative for: End Stage Renal Disease Past Medical History Notes: CKD stage 3 Immunological Medical History: Negative for: Lupus Erythematosus; Raynauds; Scleroderma Integumentary (Skin) Medical History: Negative for: History of Burn Musculoskeletal Medical History: Positive for: Osteoarthritis; Osteomyelitis - right foot Negative for: Gout; Rheumatoid Arthritis Neurologic Medical History: Positive for: Neuropathy Negative for: Dementia; Quadriplegia; Paraplegia; Seizure Disorder Oncologic Medical History: Negative for: Received Chemotherapy; Received Radiation Psychiatric Medical History: Negative for: Anorexia/bulimia; Confinement  Anxiety Immunizations  Pneumococcal Vaccine: Received Pneumococcal Vaccination: No Implantable Devices None Hospitalization / Surgery History Type of Hospitalization/Surgery right 2nd toe amputation c-section Family and Social History Cancer: Yes - Paternal Grandparents; Diabetes: No; Heart Disease: No; Hereditary Spherocytosis: No; Hypertension: Yes - Father; Kidney Disease: No; Lung Disease: No; Seizures: No; Stroke: No; Thyroid Problems: No; Tuberculosis: No; Never smoker; Marital Status - Married; Alcohol Use: Moderate; Drug Use: No History; Caffeine Use: Daily - coffee; Financial Concerns: No; Food, Clothing or Shelter Needs: No; Support System Lacking: No; Transportation Concerns: No Electronic Signature(s) Signed: 10/27/2022 4:50:14 PM By: [name] DO Entered By: [name] on 10/27/2022 14:08:23 -------------------------------------------------------------------------------- SuperBill Details Patient Name: Date of Service: [name] [name] 10/27/2022 Medical Record Number: [id number] Patient Account Number: [id number] Date of Birth/Sex: Treating RN: 11-04-1965 (57 y.o. [name] [name] Primary Care Provider: [name] Other Clinician: Referring Provider: Treating Provider/Extender: Yates Decamp [place], Yeadon D ([id number]) [id number] Page 8 of 8 Weeks in Treatment: 15 Diagnosis Coding ICD-10 Codes Code Description L97.514 Non-pressure chronic ulcer of other part of right foot with necrosis of bone M86.671 Other chronic osteomyelitis, right ankle and foot E10.621 Type 1 diabetes mellitus with foot ulcer Facility Procedures : CPT4 Code: [id number] Description: 99213 - WOUND CARE VISIT-LEV 3 EST PT Modifier: Quantity: 1 Physician Procedures : CPT4 Code Description Modifier [id number] 99213 - WC PHYS LEVEL 3 - EST PT ICD-10 Diagnosis Description L97.514 Non-pressure chronic ulcer of other part of right  foot with necrosis of bone M86.671 Other chronic osteomyelitis, right ankle and foot E10.621  Type 1 diabetes mellitus with foot ulcer Quantity: 1 Electronic Signature(s) Signed: 10/27/2022 4:50:14 PM By: [name] DO Entered By: [name] on 10/27/2022 14:11:34

## 2022-10-28 NOTE — Progress Notes (Signed)
LANY, DEVAUL ([id number]) [id number] Page 1 of 8 Visit Report for 10/27/2022 Arrival Information Details Patient Name: Date of Service: [name] [name], [name] [name] 10/27/2022 10:30 A M Medical Record Number: [id number] Patient Account Number: [id number] Date of Birth/Sex: Treating RN: [birth year] (57 y.o. F) Primary Care Tressia Labrum: [name] Other Clinician: Referring Teresita Fanton: Treating Danish Ruffins/Extender: [name], YO [name] Weeks in Treatment: 15 Visit Information History Since Last Visit Added or deleted any medications: No Patient Arrived: Wheel Chair Any new allergies or adverse reactions: No Arrival Time: 10:32 Had a fall or experienced change in No Accompanied By: self activities of daily living that [name] [name] affect Transfer Assistance: None risk of falls: Patient Identification Verified: Yes Signs or symptoms of abuse/neglect since last visito No Secondary Verification Process Completed: Yes Hospitalized since last visit: No Patient Requires Transmission-Based Precautions: No Implantable device outside of the clinic excluding No Patient Has Alerts: No cellular tissue based products placed in the center since last visit: Has Dressing in Place as Prescribed: Yes Pain Present Now: Yes Electronic Signature(s) Signed: 10/28/2022 12:25:24 PM By: Thayer Dallas Entered By: Thayer Dallas on 10/27/2022 07:36:33 -------------------------------------------------------------------------------- Clinic Level of Care Assessment Details Patient Name: Date of Service: [name] [name], [name] [name] 10/27/2022 10:30 A M Medical Record Number: [id number] Patient Account Number: [id number] Date of Birth/Sex: Treating RN: October 10, 1965 (57 y.o. Katrinka Blazing Primary Care Doreene Forrey: [name] Other Clinician: Referring Clemma Johnsen: Treating Regene Mccarthy/Extender: [name], YO [name] Weeks in Treatment: 15 Clinic Level of Care Assessment Items TOOL 4  Quantity Score X- 1 0 Use when only an EandM is performed on FOLLOW-UP visit ASSESSMENTS - Nursing Assessment / Reassessment X- 1 10 Reassessment of Co-morbidities (includes updates in patient status) X- 1 5 Reassessment of Adherence to Treatment Plan ASSESSMENTS - Wound and Skin A ssessment / Reassessment X - Simple Wound Assessment / Reassessment - one wound 1 5 []  - 0 Complex Wound Assessment / Reassessment - multiple wounds []  - 0 Dermatologic / Skin Assessment (not related to wound area) ASSESSMENTS - Focused Assessment []  - 0 Circumferential Edema Measurements - multi extremities []  - 0 Nutritional Assessment / Counseling / Intervention [name] [name], [name] [name] ([id number]) [id number] Page 2 of 8 []  - 0 Lower Extremity Assessment (monofilament, tuning fork, pulses) []  - 0 Peripheral Arterial Disease Assessment (using hand held doppler) ASSESSMENTS - Ostomy and/or Continence Assessment and Care []  - 0 Incontinence Assessment and Management []  - 0 Ostomy Care Assessment and Management (repouching, etc.) PROCESS - Coordination of Care X - Simple Patient / Family Education for ongoing care 1 15 []  - 0 Complex (extensive) Patient / Family Education for ongoing care X- 1 10 Staff obtains Chiropractor, Records, T Results / Process Orders est X- 1 10 Staff telephones HHA, Nursing Homes / Clarify orders / etc []  - 0 Routine Transfer to another Facility (non-emergent condition) []  - 0 Routine Hospital Admission (non-emergent condition) []  - 0 New Admissions / Manufacturing engineer / Ordering NPWT Apligraf, etc. , []  - 0 Emergency Hospital Admission (emergent condition) X- 1 10 Simple Discharge Coordination []  - 0 Complex (extensive) Discharge Coordination PROCESS - Special Needs []  - 0 Pediatric / Minor Patient Management []  - 0 Isolation Patient Management []  - 0 Hearing / Language / Visual special needs []  - 0 Assessment of Community  assistance (transportation, D/C planning, etc.) []  - 0 Additional assistance / Altered mentation []  - 0 Support Surface(s) Assessment (bed, cushion, seat, etc.) INTERVENTIONS - Wound Cleansing / Measurement  X - Simple Wound Cleansing - one wound 1 5 []  - 0 Complex Wound Cleansing - multiple wounds X- 1 5 Wound Imaging (photographs - any number of wounds) []  - 0 Wound Tracing (instead of photographs) X- 1 5 Simple Wound Measurement - one wound []  - 0 Complex Wound Measurement - multiple wounds INTERVENTIONS - Wound Dressings X - Small Wound Dressing one or multiple wounds 1 10 []  - 0 Medium Wound Dressing one or multiple wounds []  - 0 Large Wound Dressing one or multiple wounds []  - 0 Application of Medications - topical []  - 0 Application of Medications - injection INTERVENTIONS - Miscellaneous []  - 0 External ear exam []  - 0 Specimen Collection (cultures, biopsies, blood, body fluids, etc.) []  - 0 Specimen(s) / Culture(s) sent or taken to Lab for analysis []  - 0 Patient Transfer (multiple staff / Nurse, adult / Similar devices) []  - 0 Simple Staple / Suture removal (25 or less) []  - 0 Complex Staple / Suture removal (26 or more) []  - 0 Hypo / Hyperglycemic Management (close monitor of Blood Glucose) [name] [name], [name] [name] ([id number]) [id number] Page 3 of 8 []  - 0 Ankle / Brachial Index (ABI) - do not check if billed separately X- 1 5 Vital Signs Has the patient been seen at the hospital within the last three years: Yes Total Score: 95 Level Of Care: New/Established - Level 3 Electronic Signature(s) Signed: 10/27/2022 5:30:58 PM By: Karie Schwalbe RN Entered By: Karie Schwalbe on 10/27/2022 09:37:06 -------------------------------------------------------------------------------- Encounter Discharge Information Details Patient Name: Date of Service: [name] Milo D. 10/27/2022 10:30 A M Medical Record Number: [id number] Patient Account  Number: [id number] Date of Birth/Sex: Treating RN: 13-Dec-1965 (57 y.o. Katrinka Blazing Primary Care Iokepa Geffre: [name] Other Clinician: Referring Jayron Maqueda: Treating Thula Stewart/Extender: [name], YO [name] Weeks in Treatment: 15 Encounter Discharge Information Items Discharge Condition: Stable Ambulatory Status: Wheelchair Discharge Destination: Home Transportation: Private Auto Accompanied By: self Schedule Follow-up Appointment: Yes Clinical Summary of Care: Patient Declined Electronic Signature(s) Signed: 10/27/2022 5:30:58 PM By: Karie Schwalbe RN Entered By: Karie Schwalbe on 10/27/2022 09:37:48 -------------------------------------------------------------------------------- Lower Extremity Assessment Details Patient Name: Date of Service: AHTZIRI, ALBARRAN 10/27/2022 10:30 A M Medical Record Number: [id number] Patient Account Number: [id number] Date of Birth/Sex: Treating RN: 1965-06-29 (57 y.o. F) Primary Care Cassiopeia Florentino: [name] Other Clinician: Referring Martine Bleecker: Treating Rithy Mandley/Extender: [name], YO [name] Weeks in Treatment: 15 Edema Assessment Assessed: [Left: No] [Right: No] Edema: [Left: N] [Right: o] Calf Left: Right: Point of Measurement: From Medial Instep 26.7 cm Ankle Left: Right: Point of Measurement: From Medial Instep 19.5 cm Vascular Assessment [name] [name], [name] [name] ([id number]) [id number] Page 4 of 8] Extremity colors, hair growth, and conditions: Extremity Color: [Right:Normal] Hair Growth on Extremity: [Right:No] Temperature of Extremity: [Right:Warm] Capillary Refill: [Right:< 3 seconds] Dependent Rubor: [Right:No No] Toe Nail Assessment Left: Right: Thick: No Discolored: No Deformed: No Improper Length and Hygiene: No Electronic Signature(s) Signed: 10/28/2022 12:25:24 PM By: Thayer Dallas Entered By: Thayer Dallas on 10/27/2022  07:44:44 -------------------------------------------------------------------------------- Multi Wound Chart Details Patient Name: Date of Service: [name] Milo D. 10/27/2022 10:30 A M Medical Record Number: [id number] Patient Account Number: [id number] Date of Birth/Sex: Treating RN: 01-22-1966 (57 y.o. F) Primary Care Caralee Morea: [name] Other Clinician: Referring Rosemae Mcquown: Treating Sallie Maker/Extender: [name], YO [name] Weeks in Treatment: 15 Vital Signs Height(in): 65 Capillary Blood Glucose(mg/dl): 967 Weight(lbs): 893 Pulse(bpm): 75 Body Mass Index(BMI): 17.5 Blood Pressure(mmHg):  153/80 Temperature(F): 98.3 Respiratory Rate(breaths/min): 18 [4:Photos:] [N/A:N/A] Right Calcaneus N/A N/A Wound Location: Gradually Appeared N/A N/A Wounding Event: Diabetic Wound/Ulcer of the Lower N/A N/A Primary Etiology: Extremity Hypertension, Type I Diabetes, N/A N/A Comorbid History: Osteoarthritis, Osteomyelitis, Neuropathy 05/30/2022 N/A N/A Date Acquired: 15 N/A N/A Weeks of Treatment: Healed - Epithelialized N/A N/A Wound Status: No N/A N/A Wound Recurrence: 0x0x0 N/A N/A Measurements L x W x D (cm) 0 N/A N/A A (cm) : rea 0 N/A N/A Volume (cm) : 100.00% N/A N/A % Reduction in A rea: 100.00% N/A N/A % Reduction in Volume: Grade 2 N/A N/A Classification: None Present N/A N/A Exudate A mount: Distinct, outline attached N/A N/A Wound Margin: None Present (0%) N/A N/A Granulation A mount: None Present (0%) N/A N/A Necrotic A mount: Fascia: No N/A N/A Exposed Structures: [name] [name], [name] [name] ([id number]) [id number] Page 5 of 8 Fat Layer (Subcutaneous Tissue): No Tendon: No Muscle: No Joint: No Bone: No Large (67-100%) N/A N/A Epithelialization: No Abnormalities Noted N/A N/A Periwound Skin Texture: Dry/Scaly: Yes N/A N/A Periwound Skin Moisture: Maceration: No No Abnormalities Noted N/A N/A Periwound Skin  Color: No Abnormality N/A N/A Temperature: Treatment Notes Electronic Signature(s) Signed: 10/27/2022 4:50:14 PM By: [name] DO Entered By: [name] on 10/27/2022 11:06:55 -------------------------------------------------------------------------------- Multi-Disciplinary Care Plan Details Patient Name: Date of Service: [name] Milo D. 10/27/2022 10:30 A M Medical Record Number: [id number] Patient Account Number: [id number] Date of Birth/Sex: Treating RN: [birth year] (57 y.o. Katrinka Blazing Primary Care Siearra Amberg: [name] Other Clinician: Referring Khris Jansson: Treating Donatello Kleve/Extender: [name], YO [name] Weeks in Treatment: 15 Active Inactive Electronic Signature(s) Signed: 10/27/2022 5:30:58 PM By: Karie Schwalbe RN Entered By: Karie Schwalbe on 10/27/2022 09:00:00 -------------------------------------------------------------------------------- Pain Assessment Details Patient Name: Date of Service: [name] [name], [name] [name] 10/27/2022 10:30 A M Medical Record Number: [id number] Patient Account Number: [id number] Date of Birth/Sex: Treating RN: [birth year] (57 y.o. F) Primary Care Bran Aldridge: [name] Other Clinician: Referring Jolana Runkles: Treating Rashidi Loh/Extender: [name], YO [name] Weeks in Treatment: 15 Active Problems Location of Pain Severity and Description of Pain Patient Has Paino Yes Site Locations Pain LocationJAMELLA, [name] [name] ([id number]) [id number] Page 6 of 8 Pain Location: Pain in Ulcers Rate the pain. Current Pain Level: 8 Pain Management and Medication Current Pain Management: Electronic Signature(s) Signed: 10/28/2022 12:25:24 PM By: Thayer Dallas Entered By: Thayer Dallas on 10/27/2022 07:36:45 -------------------------------------------------------------------------------- Patient/Caregiver Education Details Patient Name: Date of Service: [name] [name]  8/29/2024andnbsp10:30 A M Medical Record Number: [id number] Patient Account Number: [id number] Date of Birth/Gender: Treating RN: 11-20-1965 (57 y.o. Katrinka Blazing Primary Care Physician: [name] Other Clinician: Referring Physician: Treating Physician/Extender: [name] [name] Weeks in Treatment: 15 Education Assessment Education Provided To: Patient Education Topics Provided Wound/Skin Impairment: Methods: Explain/Verbal Responses: See progress note Electronic Signature(s) Signed: 10/27/2022 5:30:58 PM By: Karie Schwalbe RN Entered By: Karie Schwalbe on 10/27/2022 09:01:35 -------------------------------------------------------------------------------- Wound Assessment Details Patient Name: Date of Service: [name] [name], [name] [name] 10/27/2022 10:30 A M Medical Record Number: [id number] Patient Account Number: [id number] Date of Birth/Sex: Treating RN: 08-25-1965 (57 y.o. F) Primary Care Crystin Lechtenberg: [name] Other Clinician: Referring Zaliyah Meikle: Treating Ariele Vidrio/Extender: [name] [name], Mayfield D ([id number]) [id number] Page 7 of 8 Weeks in Treatment: 15 Wound Status Wound Number: 4 Primary Diabetic Wound/Ulcer of the Lower Extremity Etiology: Wound Location: Right Calcaneus Wound Status: Healed - Epithelialized Wounding Event: Gradually Appeared Comorbid  Hypertension, Type I Diabetes, Osteoarthritis, Osteomyelitis, Date Acquired: 05/30/2022 History: Neuropathy Weeks Of Treatment: 15 Clustered Wound: No Photos Wound Measurements Length: (cm) Width: (cm) Depth: (cm) Area: (cm) Volume: (cm) 0 % Reduction in Area: 100% 0 % Reduction in Volume: 100% 0 Epithelialization: Large (67-100%) 0 Tunneling: No 0 Undermining: No Wound Description Classification: Grade 2 Wound Margin: Distinct, outline attached Exudate Amount: None Present Foul Odor After Cleansing: No Slough/Fibrino No Wound  Bed Granulation Amount: None Present (0%) Exposed Structure Necrotic Amount: None Present (0%) Fascia Exposed: No Fat Layer (Subcutaneous Tissue) Exposed: No Tendon Exposed: No Muscle Exposed: No Joint Exposed: No Bone Exposed: No Periwound Skin Texture Texture Color No Abnormalities Noted: Yes No Abnormalities Noted: Yes Moisture Temperature / Pain No Abnormalities Noted: No Temperature: No Abnormality Dry / Scaly: Yes Maceration: No Electronic Signature(s) Signed: 10/27/2022 5:30:58 PM By: Karie Schwalbe RN Entered By: Karie Schwalbe on 10/27/2022 08:00:02 -------------------------------------------------------------------------------- Vitals Details Patient Name: Date of Service: [name] Milo D. 10/27/2022 10:30 A M Medical Record Number: [id number] Patient Account Number: [id number] Date of Birth/Sex: Treating RN: 1966-01-11 (57 y.o. F) Primary Care Trindon Dorton: [name] Other Clinician: Referring Natia Fahmy: Treating Jiles Goya/Extender: [name], YO [name] Weeks in Treatment: [address] ([id number]) [id number] Page 8 of 8 Vital Signs Time Taken: 10:39 Temperature (F): 98.3 Height (in): 65 Pulse (bpm): 75 Weight (lbs): 105 Respiratory Rate (breaths/min): 18 Body Mass Index (BMI): 17.5 Blood Pressure (mmHg): 153/80 Capillary Blood Glucose (mg/dl): 086 Reference Range: 80 - 120 mg / dl Electronic Signature(s) Signed: 10/28/2022 12:25:24 PM By: Thayer Dallas Entered By: Thayer Dallas on 10/27/2022 07:39:40

## 2022-11-01 ENCOUNTER — Ambulatory Visit (INDEPENDENT_AMBULATORY_CARE_PROVIDER_SITE_OTHER): Payer: BC Managed Care – PPO | Admitting: Podiatry

## 2022-11-01 ENCOUNTER — Ambulatory Visit (INDEPENDENT_AMBULATORY_CARE_PROVIDER_SITE_OTHER): Payer: BC Managed Care – PPO

## 2022-11-01 DIAGNOSIS — L97414 Non-pressure chronic ulcer of right heel and midfoot with necrosis of bone: Secondary | ICD-10-CM

## 2022-11-01 DIAGNOSIS — M14672 Charcot's joint, left ankle and foot: Secondary | ICD-10-CM | POA: Diagnosis not present

## 2022-11-01 NOTE — Progress Notes (Signed)
Subjective: Chief Complaint  Patient presents with   Foot Ulcer     Pt present today for ulcer on the right heel pt stated that she doing so much better.    57 year old female presents the office for above concerns.  She did the right side is been doing great she has been released by the wound care center.  She had to reschedule her appointment for their shoes, inserts.  She still gets swelling and discomfort to the left foot.  No injuries that she recalls.  Objective: AAO x3, NAD-in wheelchair with cam boot on right foot DP/PT pulses palpable bilaterally, CRT less than 3 seconds Right foot: Ulceration of the heel is healed.  Scars present.  On the hallux there is an area of dried blood but there is no skin breakdown otherwise.  There is no drainage or pus.  There is no fluctuation or crepitation.  There is no malodor. Left foot: Pitting edema present to the foot and she does get some discomfort.  There is no significant increased temperature unable to appreciate today.  No erythema or any open lesions.  There is no areas of fluctuation or crepitation. 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  -X-rays obtained reviewed of the left and right foot.  3 views of the feet were obtained.  The left foot noted arthritic changes present in the midfoot with Charcot and osteopenia present.  No evidence of acute fracture.  Did not appear significantly changed compared to prior x-ray.  In the right foot previous fracture from osteomyelitis of the posterior calcaneus.  Appears to be increased cortication noted.  Arthritic changes present the first MPJ.  Previous second cavitation. -Right foot is healed.  Still remain in the cam boot today the new boot was  dispensed to help offload.  Monitor for any reoccurrence.  I will get him started in physical therapy work on gentle lower extremity strengthening until she is able to get her diabetic shoe insert will remain in the cam boot. -Discussed possible for Charcot on the left foot vs pain from the swelling.  Discussed compression socks.  Also she has been putting more pressure in the left foot as she has been nonweightbearing of the right side.  There is also does concern for possible Charcot given increased pressure.  This was not improving with compression recommend mobilization.  She has an extra boot at home. -Monitor for any clinical signs or symptoms of infection and directed to call the office immediately should any occur or go to the ER.  No follow-ups on file.  [name] DPM

## 2022-11-09 ENCOUNTER — Ambulatory Visit: Payer: Medicare Other

## 2022-11-09 DIAGNOSIS — I739 Peripheral vascular disease, unspecified: Secondary | ICD-10-CM

## 2022-11-09 DIAGNOSIS — M14672 Charcot's joint, left ankle and foot: Secondary | ICD-10-CM

## 2022-11-09 DIAGNOSIS — E1049 Type 1 diabetes mellitus with other diabetic neurological complication: Secondary | ICD-10-CM

## 2022-11-09 DIAGNOSIS — L97414 Non-pressure chronic ulcer of right heel and midfoot with necrosis of bone: Secondary | ICD-10-CM

## 2022-11-09 DIAGNOSIS — E109 Type 1 diabetes mellitus without complications: Secondary | ICD-10-CM

## 2022-11-09 NOTE — Progress Notes (Signed)
Patient presents to the office today for diabetic shoe and insole measuring.  Patient was measured with brannock device to determine size and width for 1 pair of extra depth shoes and foam casted for 3 pair of insoles.   Documentation of medical necessity will be sent to patient's treating diabetic doctor to verify and sign.   Patient's diabetic provider: Bayard Beaver  Shoes and insoles will be ordered at that time and patient will be notified for an appointment for fitting when they arrive.   Shoe size (per patient): 7.5 Brannock measurement: 6 Patient shoe selection- Shoe choice:   [id number] / X2340W Shoe size ordered: 7WD  ABN and Financial Signed

## 2022-11-15 ENCOUNTER — Ambulatory Visit (INDEPENDENT_AMBULATORY_CARE_PROVIDER_SITE_OTHER): Payer: BC Managed Care – PPO | Admitting: Podiatry

## 2022-11-15 DIAGNOSIS — M14672 Charcot's joint, left ankle and foot: Secondary | ICD-10-CM

## 2022-11-15 DIAGNOSIS — L97511 Non-pressure chronic ulcer of other part of right foot limited to breakdown of skin: Secondary | ICD-10-CM

## 2022-11-15 DIAGNOSIS — R609 Edema, unspecified: Secondary | ICD-10-CM

## 2022-11-26 NOTE — Progress Notes (Signed)
Subjective: No chief complaint on file.    57 year old female presents the office for above concerns.  She states she is doing well.  She does not report any new ulcerations of the left foot and the wound on the right foot is healed.  Area in the hallux where she has some dried blood is also doing better.  Left foot is been stable.  She does not see any increased redness or any heat associated with this but still getting some swelling on the left foot.  No injuries that she reports.  Awaiting shoes.   Objective: AAO x3, NAD-in wheelchair with cam boot on right foot DP/PT pulses palpable bilaterally, CRT less than 3 seconds Right foot: Ulceration of the heel is healed.  There is no skin breakdown of the heel there is no edema, erythema or signs of infection.  Superficial area significant noted along the hallux with a area of scab and dried blood assessment is much better.  There is no surrounding erythema, ascending cellulitis.  No fluctuation or crepitation.  There is no other. Left foot: There is some mild pitting edema present there is no erythema there is no warmth.  There is no pain on exam.  There are no open lesions. No pain with calf compression, swelling, warmth, erythema  Assessment: 57 year old female with right heel ulceration, osteomyelitis, calcaneal fracture; left foot swelling  Plan: -All treatment options discussed with the patient including all alternatives, risks, complications.  -Overall right foot is doing much better.  Continue the cam boot for now but hopefully when she gets her shoe, insert we can gradually transition to that as tolerated.  She is to monitor very closely for any further skin breakdown.  Continue small amount of antibiotic ointment dressing changes on the right hallux ulcer is a very superficial almost healed this time.  Monitor closely.  Signs or symptoms of infection. -Left foot is not swollen there is no increased temperature, pain or heat associated  with this.  Continue compression, elevation.  Needed we can use the cam boot on the left foot and surgical shoe on the right foot. -Elevation -Monitor for any clinical signs or symptoms of infection and directed to call the office immediately should any occur or go to the ER.  Return in about 4 weeks (around 12/13/2022).  X-ray bilateral feet next appointment  [name] DPM

## 2022-12-01 NOTE — Progress Notes (Signed)
Per safe step Treating Dr. Has not signed off on qualifying documents  I have notified patient she is call their office today  Addison Bailey Cped, CFo, CFm

## 2022-12-13 ENCOUNTER — Ambulatory Visit: Payer: Medicare Other

## 2022-12-13 ENCOUNTER — Ambulatory Visit (INDEPENDENT_AMBULATORY_CARE_PROVIDER_SITE_OTHER): Payer: Medicare Other | Admitting: Podiatry

## 2022-12-13 DIAGNOSIS — Z91199 Patient's noncompliance with other medical treatment and regimen due to unspecified reason: Secondary | ICD-10-CM

## 2022-12-13 DIAGNOSIS — M14671 Charcot's joint, right ankle and foot: Secondary | ICD-10-CM

## 2022-12-13 DIAGNOSIS — M14672 Charcot's joint, left ankle and foot: Secondary | ICD-10-CM

## 2022-12-18 NOTE — Progress Notes (Signed)
No show

## 2023-01-02 ENCOUNTER — Ambulatory Visit: Payer: Medicare Other | Admitting: Podiatry

## 2023-01-03 ENCOUNTER — Ambulatory Visit (INDEPENDENT_AMBULATORY_CARE_PROVIDER_SITE_OTHER): Payer: BC Managed Care – PPO | Admitting: Podiatry

## 2023-01-03 ENCOUNTER — Encounter: Payer: Self-pay | Admitting: Podiatry

## 2023-01-03 ENCOUNTER — Ambulatory Visit (INDEPENDENT_AMBULATORY_CARE_PROVIDER_SITE_OTHER): Payer: BC Managed Care – PPO

## 2023-01-03 DIAGNOSIS — E1049 Type 1 diabetes mellitus with other diabetic neurological complication: Secondary | ICD-10-CM | POA: Diagnosis not present

## 2023-01-03 DIAGNOSIS — M778 Other enthesopathies, not elsewhere classified: Secondary | ICD-10-CM

## 2023-01-03 DIAGNOSIS — M86671 Other chronic osteomyelitis, right ankle and foot: Secondary | ICD-10-CM

## 2023-01-03 NOTE — Progress Notes (Unsigned)
Subjective: Chief Complaint  Patient presents with   Diabetic Ulcer    Right foot ulcer patient states is doing well healing well.     57 year old female presents the office for above concerns.  She has been doing well she is back to wearing her regular shoe.  She is walking with a walker.  She been doing physical therapy for her back as well.  She does not report any new ulcerations.  The swelling is also improved to her feet.  No pain.  She does not report any fevers or chills.  She has no new concerns.   Objective: AAO x3, NAD-in wheelchair with cam boot on right foot DP/PT pulses palpable bilaterally, CRT less than 3 seconds Right foot: Ulceration of the heel is healed.  There is 1 small skin fissure type lesion noted on the first toe but is almost scabbed over and there is no edema, erythema or drainage approximately signs of infection.  There is no other open lesions identified bilaterally.  There is mild chronic edema present bilaterally but there is no erythema or any warmth.  No signs of infection present.  No pain with calf compression, swelling, warmth, erythema  Assessment: 57 year old female with right heel ulceration, osteomyelitis, calcaneal fracture; left foot swelling  Plan: -All treatment options discussed with the patient including all alternatives, risks, complications.  -X-ray obtained reviewed of the right foot.  Multiple views were obtained.  No evidence of acute fracture.  Increased consolidation noted across the calcaneus on the area the previous fracture from osteomyelitis. -Overall doing much better.  There is a bit does not was completely healed as well.  She is to monitor very closely for any signs or symptoms of further skin breakdown or signs of infection. -She is doing well on both feet.  No signs of acute Charcot on the left foot or any pain.  Continue supportive shoe gear. -Daily foot inspection, glucose control.   Return in about 3 months (around 04/05/2023)  for diabetic foot exam.  [name] DPM

## 2023-01-03 NOTE — Patient Instructions (Signed)

## 2023-04-06 ENCOUNTER — Ambulatory Visit: Payer: Medicare Other | Admitting: Podiatry

## 2023-05-15 ENCOUNTER — Ambulatory Visit: Payer: Medicare Other | Admitting: Podiatry

## 2023-06-01 ENCOUNTER — Ambulatory Visit (INDEPENDENT_AMBULATORY_CARE_PROVIDER_SITE_OTHER): Admitting: Podiatry

## 2023-06-01 ENCOUNTER — Encounter: Payer: Self-pay | Admitting: Podiatry

## 2023-06-01 DIAGNOSIS — M79674 Pain in right toe(s): Secondary | ICD-10-CM | POA: Diagnosis not present

## 2023-06-01 DIAGNOSIS — R609 Edema, unspecified: Secondary | ICD-10-CM

## 2023-06-01 DIAGNOSIS — E1049 Type 1 diabetes mellitus with other diabetic neurological complication: Secondary | ICD-10-CM

## 2023-06-01 DIAGNOSIS — M79675 Pain in left toe(s): Secondary | ICD-10-CM | POA: Diagnosis not present

## 2023-06-01 DIAGNOSIS — B351 Tinea unguium: Secondary | ICD-10-CM

## 2023-06-01 DIAGNOSIS — L97511 Non-pressure chronic ulcer of other part of right foot limited to breakdown of skin: Secondary | ICD-10-CM | POA: Diagnosis not present

## 2023-06-01 NOTE — Progress Notes (Signed)
 Subjective: Chief Complaint  Patient presents with   [facility]    RM#85 DFC   58 year old female presents the office today for diabetic foot evaluation.  She states the wound on the heel completely healed has not had any further skin breakdown.  She does not superficial wound on the right big toe which will occasionally bleed.  No swelling redness or any purulence.  She does not report any fevers or chills.  Her nails are also thickened elongated.  Objective: AAO x3, NAD DP/PT pulses palpable bilaterally, CRT less than 3 seconds Nails appear to be hypertrophic, dystrophic discolored with fungal debris toenails 1, 3, 4, 5 on the right and 1-5 on the left.  No edema, erythema to the toenail sites. Superficial areas covered on the right hallux with some fresh blood.  No provide monitor time.  No surrounding erythema, ascending cellulitis.  No fluctuation, crepitation, malodor. Ulceration on the right heel is healed.  No further skin breakdown. Chronic edema present bilaterally. No open lesions or pre-ulcerative lesions.  No pain with calf compression, swelling, warmth, erythema  Assessment: Symptomatic onychosis, diabetic foot exam with chronic edema  Plan: Diabetic foot exam -Discussing plan today for inspection with glucose control.  Lower extremity edema -Ordered venous reflux study  Right hallux ulceration -Superficial with any signs of infection.  Antibiotic ointment dressing changes daily.  If not healed next 2 to 3 weeks or any other significant issues or any signs or symptoms of infection. -Monitor for any clinical signs or symptoms of infection and directed to call the office immediately should any occur or go to the ER.  Symptomatic onychomycosis -Sharply debrided x 9 without any complications or bleeding.  [name] DPM

## 2023-07-10 ENCOUNTER — Ambulatory Visit (HOSPITAL_COMMUNITY)

## 2023-07-18 ENCOUNTER — Ambulatory Visit (HOSPITAL_COMMUNITY)
Admission: RE | Admit: 2023-07-18 | Discharge: 2023-07-18 | Disposition: A | Discharge status: Home / Self Care | Source: Ambulatory Visit | Attending: Podiatry | Admitting: Podiatry

## 2023-07-18 DIAGNOSIS — R609 Edema, unspecified: Secondary | ICD-10-CM

## 2023-07-21 ENCOUNTER — Ambulatory Visit: Payer: Self-pay | Admitting: Podiatry

## 2023-07-21 ENCOUNTER — Other Ambulatory Visit: Payer: Self-pay | Admitting: Podiatry

## 2023-07-21 DIAGNOSIS — I872 Venous insufficiency (chronic) (peripheral): Secondary | ICD-10-CM

## 2023-07-28 ENCOUNTER — Telehealth: Payer: Self-pay | Admitting: Podiatry

## 2023-07-28 ENCOUNTER — Encounter (HOSPITAL_COMMUNITY)

## 2023-07-28 NOTE — Telephone Encounter (Signed)
 Patient is requesting results from Ultrasound requested (Vein and Vascular) Patient contact telephone number, [phone]

## 2023-08-01 ENCOUNTER — Ambulatory Visit: Admitting: Podiatry

## 2023-08-22 ENCOUNTER — Ambulatory Visit: Admitting: Podiatry

## 2023-09-05 ENCOUNTER — Ambulatory Visit

## 2023-09-26 ENCOUNTER — Ambulatory Visit (INDEPENDENT_AMBULATORY_CARE_PROVIDER_SITE_OTHER)

## 2023-09-26 ENCOUNTER — Ambulatory Visit (INDEPENDENT_AMBULATORY_CARE_PROVIDER_SITE_OTHER): Admitting: Podiatry

## 2023-09-26 ENCOUNTER — Encounter: Payer: Self-pay | Admitting: Podiatry

## 2023-09-26 DIAGNOSIS — L03116 Cellulitis of left lower limb: Secondary | ICD-10-CM

## 2023-09-26 DIAGNOSIS — E08621 Diabetes mellitus due to underlying condition with foot ulcer: Secondary | ICD-10-CM | POA: Diagnosis not present

## 2023-09-26 DIAGNOSIS — M7989 Other specified soft tissue disorders: Secondary | ICD-10-CM

## 2023-09-26 DIAGNOSIS — M79674 Pain in right toe(s): Secondary | ICD-10-CM

## 2023-09-26 DIAGNOSIS — M79675 Pain in left toe(s): Secondary | ICD-10-CM

## 2023-09-26 DIAGNOSIS — L97522 Non-pressure chronic ulcer of other part of left foot with fat layer exposed: Secondary | ICD-10-CM

## 2023-09-26 DIAGNOSIS — E1049 Type 1 diabetes mellitus with other diabetic neurological complication: Secondary | ICD-10-CM

## 2023-09-26 DIAGNOSIS — B351 Tinea unguium: Secondary | ICD-10-CM | POA: Diagnosis not present

## 2023-09-26 MED ORDER — DOXYCYCLINE HYCLATE 100 MG PO TABS: 100.0000 mg | ORAL_TABLET | Freq: Two times a day (BID) | ORAL | 0 refills | Status: DC

## 2023-09-26 MED ORDER — MUPIROCIN 2 % EX OINT: 1.0000 | TOPICAL_OINTMENT | Freq: Two times a day (BID) | CUTANEOUS | 2 refills | Status: AC

## 2023-09-27 NOTE — Progress Notes (Signed)
 Subjective: Chief Complaint  Patient presents with   Foot Ulcer    Pt. Went to beach last week and blisters appeared on left foot that ruptured creating ulcers dorsal and plantar aspect. 1 pain. IDDM A1C 11.6. DFC toenail trim.    58 year old female presents the office today diabetic foot evaluation as well as for elongated nails.  She has a history of ulcerations which have healed however she was at the beach and developed blisters on her left foot which did pop.  She has had some swelling recently.  Does not report any purulence.  No fevers or chills.  Last A1c was 10.9 on August 30, 2023  Objective: AAO x3, NAD DP/PT pulses palpable bilaterally, CRT less than 3 seconds Nails appear to be hypertrophic, dystrophic discolored with fungal debris toenails 1, 3, 4, 5 on the right and 1-5 on the left.  No edema, erythema to the toenail sites. As pictured below there is a full-thickness ulceration along the medial aspect the foot as well as along the second digit.  There is localized erythema but there is no fluctuation or crepitation.  There is no malodor.  There is edema present to the left foot. No open lesions or pre-ulcerative lesions.  No pain with calf compression, swelling, warmth, erythema       Assessment: Symptomatic onychomycosis, left foot ulcerations with cellulitis  Plan: Ulcer in his left foot with cellulitis - X-rays obtained reviewed.  On the AP view there is questionable cortical changes along the second digit concerning for possible early osteomyelitis.  Soft tissue edema is present. - Recommend antibiotic ointment, mupirocin  ointment to symptoms daily. - Prescribed doxycycline  - Offloading - Monitor for any clinical signs or symptoms of infection and directed to call the office immediately should any occur or go to the ER. - Repeat x-ray next appointment to evaluate for osteomyelitis  Symptomatic onychomycosis -Sharply debrided x 9 without any complications or  bleeding.  Donnice JONELLE Fees DPM

## 2023-10-06 ENCOUNTER — Ambulatory Visit: Admitting: Podiatry

## 2023-10-06 ENCOUNTER — Ambulatory Visit (INDEPENDENT_AMBULATORY_CARE_PROVIDER_SITE_OTHER)

## 2023-10-06 VITALS — Ht 65.0 in | Wt 105.0 lb

## 2023-10-06 DIAGNOSIS — L97522 Non-pressure chronic ulcer of other part of left foot with fat layer exposed: Secondary | ICD-10-CM | POA: Diagnosis not present

## 2023-10-06 DIAGNOSIS — E08621 Diabetes mellitus due to underlying condition with foot ulcer: Secondary | ICD-10-CM

## 2023-10-06 MED ORDER — DOXYCYCLINE HYCLATE 100 MG PO TABS: 100.0000 mg | ORAL_TABLET | Freq: Two times a day (BID) | ORAL | 0 refills | Status: DC

## 2023-10-11 NOTE — Progress Notes (Signed)
 Subjective: Chief Complaint  Patient presents with   Cellulitis    Rm 12 Patient is here for cellulitis of the left foot. Wounds are open with moderate drainage.    58 year old female presents the office today for follow-up evaluation of wounds on the left foot.  She thinks they are doing better.  She denies any drainage or purulence.  Swelling is improved.  Increases redness.  No fevers or chills that she reports.  No other concerns.    Last A1c was 10.9 on August 30, 2023  Objective: AAO x3, NAD DP/PT pulses palpable bilaterally, CRT less than 3 seconds As pictured below there is a full-thickness ulceration along the medial aspect the foot as well as along the second digit.  There is localized erythema appears to be improving as well as improved edema.  There is no drainage or pus.  There is no fluctuation or crepitation.  There is no malodor.  Digital contractures present.  Flatfoot is present.  No open lesions or pre-ulcerative lesions.  No pain with calf compression, swelling, warmth, erythema          Assessment: Symptomatic onychomycosis, left foot ulcerations with cellulitis  Plan: Ulcer in his left foot with cellulitis - X-rays obtained reviewed.  No definitive cortical changes today suggest osteomyelitis.  There is no evidence of acute fracture.  No soft tissue emphysema. - Continue with antibiotic ointment, mupirocin  ointment to symptoms daily. - Refilled doxycycline  - Offloading - Monitor for any clinical signs or symptoms of infection and directed to call the office immediately should any occur or go to the ER. - Repeat x-ray next appointment to evaluate for osteomyelitis  [name] [name] DPM

## 2023-10-13 ENCOUNTER — Telehealth: Payer: Self-pay | Admitting: Podiatry

## 2023-10-13 NOTE — Telephone Encounter (Signed)
 Patient states that her chest and stomach pains, and nausea. Is this a side effect of doxycycline ? Should she stop taking it?

## 2023-10-24 ENCOUNTER — Encounter: Payer: Self-pay | Admitting: Podiatry

## 2023-10-24 ENCOUNTER — Ambulatory Visit (INDEPENDENT_AMBULATORY_CARE_PROVIDER_SITE_OTHER)

## 2023-10-24 ENCOUNTER — Ambulatory Visit (INDEPENDENT_AMBULATORY_CARE_PROVIDER_SITE_OTHER): Admitting: Podiatry

## 2023-10-24 VITALS — BP 132/86 | HR 69 | Temp 97.0°F

## 2023-10-24 DIAGNOSIS — E08621 Diabetes mellitus due to underlying condition with foot ulcer: Secondary | ICD-10-CM

## 2023-10-24 DIAGNOSIS — L97422 Non-pressure chronic ulcer of left heel and midfoot with fat layer exposed: Secondary | ICD-10-CM

## 2023-10-24 DIAGNOSIS — L97522 Non-pressure chronic ulcer of other part of left foot with fat layer exposed: Secondary | ICD-10-CM

## 2023-10-24 MED ORDER — CLINDAMYCIN HCL 300 MG PO CAPS: 300.0000 mg | ORAL_CAPSULE | Freq: Three times a day (TID) | ORAL | 0 refills | Status: AC

## 2023-10-25 NOTE — Progress Notes (Signed)
 Subjective: Chief Complaint  Patient presents with   Diabetic Ulcer    The one on the back of my toe has healed up.  The other one is shrinking, it's getting there.  Saw Dr. Claudene - 1.5 mos ago; A1c - 39.29     58 year old female presents the office today for follow-up evaluation of wounds on the left foot.  She states the wound is healing the second toe and the wound on the lateral medial aspect that is doing better.  She still keeping the bandage on the area daily.  She denies any increase in swelling.  She did have this up with doxycycline  given GI side effects and she was seen in the emergency department for this.    Last A1c was 10.9 on August 30, 2023  Objective: AAO x3, NAD DP/PT pulses palpable bilaterally, CRT less than 3 seconds As pictured below there is a full-thickness ulceration along the medial aspect the foot.  Relation tissue is present but the wound is doing better and is smaller today.  There is no probing, undermining or tunneling.  Rim of erythema without any ascending cellulitis.  The wound of the second toe is resolved.  There is no fluctuation or crepitation. No open lesions or pre-ulcerative lesions.  No pain with calf compression, swelling, warmth, erythema         Assessment: Symptomatic onychomycosis, resolving left foot ulcerations with cellulitis  Plan: Ulcer in his left foot with cellulitis - X-rays obtained reviewed.  No definitive cortical changes today suggest osteomyelitis.  Soft tissue radiolucency noted on the lateral view of the second toe.  Not able to visualize it on the other views.  Arthritic changes present. -Will continue daily dressing changes, offloading at all times.  Prescribed clindamycin .  Discussed side effects of medication and should any occur to stop taking this let me know. -Monitor for any clinical signs or symptoms of infection and directed to call the office immediately should any occur or go to the ER.  Return in about 3  weeks (around 11/14/2023) for left foot ulcer.  [name] [name] DPM

## 2023-11-02 NOTE — Progress Notes (Deleted)
 Requested by:  Donavan Burkitt, MD [address],  [place] [address]  Reason for consultation: venous insufficiency    History of Present Illness   [name] [name] is a 58 y.o. (Jun 20, 1965) female who presents for evaluation of ***  Venous symptoms include: (aching, heavy, tired, throbbing, burning, itching, swelling, bleeding, ulcer)  *** Onset/duration:  ***  Occupation:  *** Aggravating factors: (sitting, standing) Alleviating factors: (elevation) Compression:  *** Helps:  *** Pain medications:  *** Previous vein procedures:  *** History of DVT:  ***  Past Medical History:  Diagnosis Date   Arthritis    Diabetic polyneuropathy (HCC)    Diabetic polyneuropathy (HCC)     Past Surgical History:  Procedure Laterality Date   CESAREAN SECTION      Social History   Socioeconomic History   Marital status: Married    Spouse name: Not on file   Number of children: Not on file   Years of education: Not on file   Highest education level: Not on file  Occupational History   Not on file  Tobacco Use   Smoking status: Never   Smokeless tobacco: Never  Substance and Sexual Activity   Alcohol use: Yes    Comment: occ   Drug use: Never   Sexual activity: Not on file  Other Topics Concern   Not on file  Social History Narrative   Not on file   Social Drivers of Health   Financial Resource Strain: Not on file  Food Insecurity: Low Risk  (06/21/2023)   Received from [facility]   Hunger Vital Sign    Within the past 12 months, you worried that your food would run out before you got money to buy more: Never true    Within the past 12 months, the food you bought just didn't last and you didn't have money to get more. : Never true  Transportation Needs: No Transportation Needs (06/21/2023)   Received from [facility]    In the past 12 months, has lack of reliable transportation kept you from medical appointments, meetings, work  or from getting things needed for daily living? : No  Physical Activity: Not on file  Stress: Not on file  Social Connections: Unknown (07/10/2021)   Received from [facility]   Social Network    Social Network: Not on file  Intimate Partner Violence: Low Risk  (03/28/2022)   Received from [facility] visits prior to 04/30/2022.   Safety    How often does anyone, including family and friends, physically hurt you?: Never    How often does anyone, including family and friends, insult or talk down to you?: Never    How often does anyone, including family and friends, threaten you with harm?: Never    How often does anyone, including family and friends, scream or curse at you?: Never   No family history on file.  Current Outpatient Medications  Medication Sig Dispense Refill   acetaminophen  (TYLENOL ) 325 MG tablet Take 650 mg by mouth every 6 (six) hours as needed for mild pain, fever or headache.     amLODipine (NORVASC) 5 MG tablet Take 1 tablet by mouth daily.     carvedilol (COREG) 12.5 MG tablet Take 12.5 mg by mouth 2 (two) times daily.     clindamycin  (CLEOCIN ) 300 MG capsule Take 1 capsule (300 mg total) by mouth 3 (three) times daily. 21 capsule 0  colchicine  0.6 MG tablet Take 1 tablet (0.6 mg total) by mouth daily. 7 tablet 0   Continuous Glucose Sensor (FREESTYLE LIBRE 3 SENSOR) MISC SMARTSIG:1 Topical Every 2 Weeks     dicyclomine (BENTYL) 10 MG capsule Take 10 mg by mouth 4 (four) times daily as needed.     dorzolamide -timolol  (COSOPT ) 22.3-6.8 MG/ML ophthalmic solution INSTILL 1 DROP INTO LEFT EYE TWICE A DAY 10 mL 1   furosemide (LASIX) 20 MG tablet Take 20 mg by mouth daily.     gabapentin  (NEURONTIN ) 300 MG capsule Take 300 mg by mouth 2 (two) times daily.      hydrALAZINE  (APRESOLINE ) 25 MG tablet Take 25 mg by mouth every 8 (eight) hours.     ibuprofen (ADVIL) 200 MG tablet Take 400 mg by mouth every 6 (six) hours as needed for fever, headache or  mild pain.     insulin  glargine (LANTUS ) 100 UNIT/ML Solostar Pen Inject 8 Units into the skin daily. 15 mL 2   insulin  lispro (HUMALOG KWIKPEN) 100 UNIT/ML KiwkPen Inject 3-10 Units into the skin 3 (three) times daily.      Insulin  Pen Needle (B-D ULTRAFINE III SHORT PEN) 31G X 8 MM MISC Inject 1 each into the skin 4 (four) times daily -  before meals and at bedtime. 100 each 3   levothyroxine  (SYNTHROID ) 88 MCG tablet Take 88 mcg by mouth daily.     losartan (COZAAR) 50 MG tablet Take 50 mg by mouth daily.     magnesium oxide (MAG-OX) 400 MG tablet Take 1 tablet by mouth daily.     metFORMIN (GLUCOPHAGE) 500 MG tablet Take 500 mg by mouth 2 (two) times daily.     methocarbamol (ROBAXIN) 500 MG tablet Take by mouth.     mupirocin  ointment (BACTROBAN ) 2 % Apply 1 Application topically 2 (two) times daily. 30 g 2   NOVOLIN R FLEXPEN RELION 100 UNIT/ML FlexPen Inject into the skin.     omeprazole (PRILOSEC) 20 MG capsule Take 20 mg by mouth daily.      ondansetron  (ZOFRAN ) 4 MG tablet Take 4 mg by mouth every 8 (eight) hours as needed for nausea.      ONETOUCH VERIO test strip USE TO CHECK BLOOD SUGARS FOUR TIMES DAILY  5   oxyCODONE  (OXY IR/ROXICODONE ) 5 MG immediate release tablet Take 5-10 mg by mouth every 4 (four) hours as needed for pain.     tamsulosin (FLOMAX) 0.4 MG CAPS capsule Take 1 tablet by mouth daily.     Teriparatide 600 MCG/2.4ML SOPN Inject into the skin.     No current facility-administered medications for this visit.    Allergies  Allergen Reactions   Lisinopril Cough    REVIEW OF SYSTEMS (negative unless checked):   Cardiac:  []  Chest pain or chest pressure? []  Shortness of breath upon activity? []  Shortness of breath when lying flat? []  Irregular heart rhythm?  Vascular:  []  Pain in calf, thigh, or hip brought on by walking? []  Pain in feet at night that wakes you up from your sleep? []  Blood clot in your veins? []  Leg swelling?  Pulmonary:  []  Oxygen  at  home? []  Productive cough? []  Wheezing?  Neurologic:  []  Sudden weakness in arms or legs? []  Sudden numbness in arms or legs? []  Sudden onset of difficult speaking or slurred speech? []  Temporary loss of vision in one eye? []  Problems with dizziness?  Gastrointestinal:  []  Blood in stool? []  Vomited blood?  Genitourinary:  []   Burning when urinating? []  Blood in urine?  Psychiatric:  []  Major depression  Hematologic:  []  Bleeding problems? []  Problems with blood clotting?  Dermatologic:  []  Rashes or ulcers?  Constitutional:  []  Fever or chills?  Ear/Nose/Throat:  []  Change in hearing? []  Nose bleeds? []  Sore throat?  Musculoskeletal:  []  Back pain? []  Joint pain? []  Muscle pain?   Physical Examination    There were no vitals filed for this visit. There is no height or weight on file to calculate BMI.  General:  WDWN in NAD; vital signs documented above Gait: Not observed HENT: WNL, normocephalic Pulmonary: normal non-labored breathing , without Rales, rhonchi,  wheezing Cardiac: {Desc; regular/irreg:14544} HR, without  Murmurs {With/Without:20273} carotid bruit*** Abdomen: soft Extremities: {With/Without:20273} varicose veins, {With/Without:20273} reticular veins, {With/Without:20273} edema, {With/Without:20273} stasis pigmentation, {With/Without:20273} lipodermatosclerosis, {With/Without:20273} ulcers Musculoskeletal: no muscle wasting or atrophy  Neurologic: A&O X 3;  No focal weakness or paresthesias are detected Psychiatric:  The pt has Normal affect.  Non-invasive Vascular Imaging   BLE Venous Insufficiency Duplex (07/18/23): LLE: No DVT and SVT GSV reflux at [facility]  GSV diameter 0.28-0.65 cm No SSV reflux, occluded CFV, FV, PFV deep venous reflux   Medical Decision Making   [name] [name] is a 58 y.o. female who presents with: LLE chronic venous insufficiency with swelling  Based on the patient's history and examination, I recommend:  ***. I discussed with the patient the use of her 20-30 mm thigh high compression stockings and need for 3 month trial of such. The patient will follow up in 3 months with Dr. FERNAND Thank you for allowing us  to participate in this patient's care.   Teretha Damme, PA-C [facility] Office: [phone]  11/02/2023, 10:45 AM  Clinic MD: [place]

## 2023-11-03 ENCOUNTER — Ambulatory Visit: Attending: Infectious Diseases

## 2023-11-06 ENCOUNTER — Ambulatory Visit: Admitting: Podiatry

## 2023-11-13 ENCOUNTER — Ambulatory Visit: Admitting: Podiatry

## 2023-11-13 ENCOUNTER — Encounter

## 2023-12-04 ENCOUNTER — Ambulatory Visit: Attending: Surgery | Admitting: Physician Assistant

## 2023-12-04 VITALS — BP 172/90 | HR 73 | Temp 97.7°F | Wt 113.7 lb

## 2023-12-04 DIAGNOSIS — I872 Venous insufficiency (chronic) (peripheral): Secondary | ICD-10-CM | POA: Diagnosis not present

## 2023-12-07 NOTE — Progress Notes (Signed)
 Requested by:  Donavan Burkitt, MD [address],  [place] [address]  Reason for consultation: Venous insufficiency   History of Present Illness   [name] [name] is a 58 y.o. (1965-05-23) female who presents for evaluation of venous insufficiency.  She says that she has had a several year history of intermittent swelling of bilateral lower extremities.  In the past her symptoms would typically improve with elevation.  She says that her swelling started getting worse in the summer 2024 when she was less mobile.  At that time she was using a wheelchair a lot due to a heel wound.  She says that her foot swelling went away after she discontinued amlodipine in January 2025.  She continues to have daily, mild ankle swelling.  Her swelling is worse in the left.  This causes her heaviness and aching discomfort.  She says that her swelling is usually worse later in the day.  She does wear knee-high compression stockings and elevates her legs, which does help with her symptoms.    She denies any prior history of previous vein procedures.  She denies any prior history of DVT.  Past Medical History:  Diagnosis Date   Arthritis    Diabetic polyneuropathy (HCC)    Diabetic polyneuropathy (HCC)     Past Surgical History:  Procedure Laterality Date   CESAREAN SECTION      Social History   Socioeconomic History   Marital status: Married    Spouse name: Not on file   Number of children: Not on file   Years of education: Not on file   Highest education level: Not on file  Occupational History   Not on file  Tobacco Use   Smoking status: Never   Smokeless tobacco: Never  Substance and Sexual Activity   Alcohol use: Yes    Comment: occ   Drug use: Never   Sexual activity: Not on file  Other Topics Concern   Not on file  Social History Narrative   Not on file   Social Drivers of Health   Financial Resource Strain: Not on file  Food Insecurity: Low Risk   (06/21/2023)   Received from [facility]   Hunger Vital Sign    Within the past 12 months, you worried that your food would run out before you got money to buy more: Never true    Within the past 12 months, the food you bought just didn't last and you didn't have money to get more. : Never true  Transportation Needs: No Transportation Needs (06/21/2023)   Received from [facility]    In the past 12 months, has lack of reliable transportation kept you from medical appointments, meetings, work or from getting things needed for daily living? : No  Physical Activity: Not on file  Stress: Not on file  Social Connections: Unknown (07/10/2021)   Received from [facility]   Social Network    Social Network: Not on file  Intimate Partner Violence: Low Risk  (03/28/2022)   Received from [facility] visits prior to 04/30/2022.   Safety    How often does anyone, including family and friends, physically hurt you?: Never    How often does anyone, including family and friends, insult or talk down to you?: Never    How often does anyone, including family and friends, threaten you with harm?: Never    How often does anyone, including family and  friends, scream or curse at you?: Never   No family history on file.  Current Outpatient Medications  Medication Sig Dispense Refill   acetaminophen  (TYLENOL ) 325 MG tablet Take 650 mg by mouth every 6 (six) hours as needed for mild pain, fever or headache.     amLODipine (NORVASC) 5 MG tablet Take 1 tablet by mouth daily.     carvedilol (COREG) 12.5 MG tablet Take 12.5 mg by mouth 2 (two) times daily.     clindamycin  (CLEOCIN ) 300 MG capsule Take 1 capsule (300 mg total) by mouth 3 (three) times daily. 21 capsule 0   colchicine  0.6 MG tablet Take 1 tablet (0.6 mg total) by mouth daily. 7 tablet 0   Continuous Glucose Sensor (FREESTYLE LIBRE 3 SENSOR) MISC SMARTSIG:1 Topical Every 2 Weeks     dicyclomine (BENTYL) 10 MG  capsule Take 10 mg by mouth 4 (four) times daily as needed.     dorzolamide -timolol  (COSOPT ) 22.3-6.8 MG/ML ophthalmic solution INSTILL 1 DROP INTO LEFT EYE TWICE A DAY 10 mL 1   furosemide (LASIX) 20 MG tablet Take 20 mg by mouth daily.     gabapentin  (NEURONTIN ) 300 MG capsule Take 300 mg by mouth 2 (two) times daily.      hydrALAZINE  (APRESOLINE ) 25 MG tablet Take 25 mg by mouth every 8 (eight) hours.     ibuprofen (ADVIL) 200 MG tablet Take 400 mg by mouth every 6 (six) hours as needed for fever, headache or mild pain.     insulin  glargine (LANTUS ) 100 UNIT/ML Solostar Pen Inject 8 Units into the skin daily. 15 mL 2   insulin  lispro (HUMALOG KWIKPEN) 100 UNIT/ML KiwkPen Inject 3-10 Units into the skin 3 (three) times daily.      Insulin  Pen Needle (B-D ULTRAFINE III SHORT PEN) 31G X 8 MM MISC Inject 1 each into the skin 4 (four) times daily -  before meals and at bedtime. 100 each 3   levothyroxine  (SYNTHROID ) 88 MCG tablet Take 88 mcg by mouth daily.     losartan (COZAAR) 50 MG tablet Take 50 mg by mouth daily.     magnesium oxide (MAG-OX) 400 MG tablet Take 1 tablet by mouth daily.     metFORMIN (GLUCOPHAGE) 500 MG tablet Take 500 mg by mouth 2 (two) times daily.     methocarbamol (ROBAXIN) 500 MG tablet Take by mouth.     mupirocin  ointment (BACTROBAN ) 2 % Apply 1 Application topically 2 (two) times daily. 30 g 2   NOVOLIN R FLEXPEN RELION 100 UNIT/ML FlexPen Inject into the skin.     omeprazole (PRILOSEC) 20 MG capsule Take 20 mg by mouth daily.      ondansetron  (ZOFRAN ) 4 MG tablet Take 4 mg by mouth every 8 (eight) hours as needed for nausea.      ONETOUCH VERIO test strip USE TO CHECK BLOOD SUGARS FOUR TIMES DAILY  5   oxyCODONE  (OXY IR/ROXICODONE ) 5 MG immediate release tablet Take 5-10 mg by mouth every 4 (four) hours as needed for pain.     tamsulosin (FLOMAX) 0.4 MG CAPS capsule Take 1 tablet by mouth daily.     Teriparatide 600 MCG/2.4ML SOPN Inject into the skin.     No  current facility-administered medications for this visit.    Allergies  Allergen Reactions   Lisinopril Cough    REVIEW OF SYSTEMS (negative unless checked):   Cardiac:  []  Chest pain or chest pressure? []  Shortness of breath upon activity? []  Shortness of  breath when lying flat? []  Irregular heart rhythm?  Vascular:  []  Pain in calf, thigh, or hip brought on by walking? []  Pain in feet at night that wakes you up from your sleep? []  Blood clot in your veins? [x]  Leg swelling?  Pulmonary:  []  Oxygen  at home? []  Productive cough? []  Wheezing?  Neurologic:  []  Sudden weakness in arms or legs? []  Sudden numbness in arms or legs? []  Sudden onset of difficult speaking or slurred speech? []  Temporary loss of vision in one eye? []  Problems with dizziness?  Gastrointestinal:  []  Blood in stool? []  Vomited blood?  Genitourinary:  []  Burning when urinating? []  Blood in urine?  Psychiatric:  []  Major depression  Hematologic:  []  Bleeding problems? []  Problems with blood clotting?  Dermatologic:  []  Rashes or ulcers?  Constitutional:  []  Fever or chills?  Ear/Nose/Throat:  []  Change in hearing? []  Nose bleeds? []  Sore throat?  Musculoskeletal:  []  Back pain? []  Joint pain? []  Muscle pain?   Physical Examination     Vitals:   12/04/23 1101  BP: (!) 172/90  Pulse: 73  Temp: 97.7 F (36.5 C)  TempSrc: Temporal  Weight: 113 lb 11.2 oz (51.6 kg)   Body mass index is 18.92 kg/m.  General:  WDWN in NAD; vital signs documented above Gait: Not observed HENT: WNL, normocephalic Pulmonary: normal non-labored breathing  Cardiac: regular Abdomen: soft, NT, no masses Skin: without rashes Vascular Exam/Pulses: 2+ DP pulses bilaterally Extremities: LLE with small reticular veins and mild nonpitting ankle edema.  No stasis pigmentation or ulcerations  Musculoskeletal: no muscle wasting or atrophy  Neurologic: A&O X 3;  No focal weakness or paresthesias are  detected Psychiatric:  The pt has Normal affect.  Non-invasive Vascular Imaging   LLE Venous Insufficiency Duplex (07/18/2023):  ----+  LEFT         Reflux NoRefluxReflux TimeDiameter cmsComments                                        Yes                                                 +--------------+---------+------+-----------+------------+-----------------  ----+  CFV                    yes   >1 second                                     +--------------+---------+------+-----------+------------+-----------------  ----+  FV prox       no                                                            +--------------+---------+------+-----------+------------+-----------------  ----+  FV mid        no                                                            +--------------+---------+------+-----------+------------+-----------------  ----+  FV dist                 yes   >1 second                                     +--------------+---------+------+-----------+------------+-----------------  ----+  Popliteal    no                                                            +--------------+---------+------+-----------+------------+-----------------  ----+  GSV at [facility]              yes    >500 ms      0.62                            +--------------+---------+------+-----------+------------+-----------------  ----+  GSV prox thighno                            0.65                            +--------------+---------+------+-----------+------------+-----------------  ----+  GSV mid thigh no                            0.29                            +--------------+---------+------+-----------+------------+-----------------  ----+  GSV dist thighno                            0.30                            +--------------+---------+------+-----------+------------+-----------------  ----+  GSV at knee    no                            0.28    branching               +--------------+---------+------+-----------+------------+-----------------  ----+  GSV prox calf no                            0.27    branching               +--------------+---------+------+-----------+------------+-----------------  ----+  SSV Pop Fossa                               0.25    appears occluded        +--------------+---------+------+-----------+------------+-----------------  ----+  SSV prox calf                               0.16    appears  chronically  occluded                +--------------+---------+------+-----------+------------+-----------------  ----+  PFV                    yes   >1 Second                                     +--------------+---------+------+-----------+------------+-----------------  ----+    Medical Decision Making   [name] [name] is a 58 y.o. female who presents for evaluation of venous insufficiency  Based on the patient's duplex, there is reflux in the left common femoral vein, distal femoral vein, profundofemoral vein, and greater saphenous vein at the saphenofemoral junction.  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  The patient would not be a candidate for saphenous vein ablation given that it is competent throughout most of the thigh and too small for access. The patient reports a several year history of bilateral lower extremity swelling, in the feet and ankles.  Her foot swelling went away after she stopped amlodipine.  She says that her ankle swelling has been persistent since 2024.  Her swelling is usually worse on the left and usually worse at the end of the day.  This causes her aching discomfort and heaviness. She denies any prior history of previous vein procedures.  She denies any prior history of DVT.  She does wear knee-high compression  stockings regularly and elevates her legs regularly.  Both of these therapies help her symptoms. On exam the patient has palpable pulses bilaterally.  She has mild, nonpitting left ankle edema.  She does not have any venous ulcerations.  She does not have any significant stasis pigmentation I have explained to the patient that she does have evidence of venous insufficiency in the left lower extremity, mostly in the deep system.  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  I have explained that her venous insufficiency is likely the cause of her swelling.  She is aware that she is not a candidate for saphenous vein ablation.  Overall her symptoms can be well-controlled with conservative therapy, including regular leg elevation, avoiding prolonged sitting and standing, exercise, and use of knee-high compression stockings daily. She can follow-up with our office as needed   Ahmed SHAUNNA Holster, PA-C [facility] Office: [phone]  Clinic MD: [place]

## 2023-12-11 ENCOUNTER — Ambulatory Visit: Admitting: Podiatry

## 2023-12-18 ENCOUNTER — Ambulatory Visit: Admitting: Podiatry

## 2024-01-05 ENCOUNTER — Ambulatory Visit: Admitting: Podiatry

## 2024-02-05 ENCOUNTER — Ambulatory Visit (INDEPENDENT_AMBULATORY_CARE_PROVIDER_SITE_OTHER): Admitting: Podiatry

## 2024-02-05 DIAGNOSIS — E1049 Type 1 diabetes mellitus with other diabetic neurological complication: Secondary | ICD-10-CM

## 2024-02-05 DIAGNOSIS — L84 Corns and callosities: Secondary | ICD-10-CM

## 2024-02-05 DIAGNOSIS — L600 Ingrowing nail: Secondary | ICD-10-CM

## 2024-02-05 MED ORDER — DOXYCYCLINE HYCLATE 100 MG PO TABS: 100.0000 mg | ORAL_TABLET | Freq: Two times a day (BID) | ORAL | 0 refills | Status: AC

## 2024-02-05 NOTE — Patient Instructions (Signed)
 Wash right big toe with soap and water , dry well.  Apply a small amount of antibiotic ointment and a bandage.  Change daily.  Start doxycycline .  If not resolved in the next 1 to 2 weeks on this know or sooner if there is any worsening.

## 2024-02-05 NOTE — Progress Notes (Signed)
 Subjective: Chief Complaint  Patient presents with   Diabetic Ulcer    Left foot ulcer follow up  Pt stated that she is doing well      58 year old female presents the office today for follow-up evaluation of wounds on the left foot.  She send has been doing well.  She has an area of a callus on the bottom of her left heel and she looks like she has had some dried blood in the callus.  She said the right big toenail got ingrown and that she tried trimming the nail.  Has been swollen and red on the corner.  No red streaks.  Does not report any fevers or chills.   Lab Results  Component Value Date   HGBA1C 11.6 (H) 04/12/2021   HGBA1C 11.5 (H) 12/12/2019   HGBA1C 11.8 (H) 04/20/2017    Objective: AAO x3, NAD DP/PT pulses palpable bilaterally, CRT less than 3 seconds On the plantar aspect the left heel is a hyperkeratotic lesion with some dried blood present under the callus.  Upon debridement there is no skin breakdown otherwise.  There is no surrounding erythema, ascending cellulitis.  There is no fluctuation or crepitation.  There is no malodor. On the right lateral hallux nail border there is incurvation of nail and there is localized edema and erythema.  No frank purulence.  There is no ascending cellulitis. No open lesions identified otherwise.   Nails are minimally elongated. No pain with calf compression, swelling, warmth, erythema    Assessment: Left foot preulcerative callus, right hallux ingrown toenail  Plan: - Regards to the left side a separate debride the callus but and complications or bleeding.  Discussed moisturizer, offloading.  Dispensed gel heel cups.  Right hallux ingrown toenail - Debrided the corn without any complications or bleeding.  Recommend antibiotic ointment dressing changes daily.  Start doxycycline .  Recommended to follow-up in the next 1 to 2 weeks if is not resolved or sooner there is any changes or worsening.  Uncontrolled diabetes with  neuropathy - Daily foot inspection.  Return in about 2 months (around 04/07/2024).  Donnice JONELLE Fees DPM

## 2024-04-08 ENCOUNTER — Ambulatory Visit: Admitting: Podiatry
